# Patient Record
Sex: Female | Born: 1935 | Race: White | Hispanic: No | Marital: Married | Smoking: Never smoker
Health system: Southern US, Community
[De-identification: ages and names within clinical notes are randomized; demographics above are authoritative.]

## PROBLEM LIST (undated history)

## (undated) DIAGNOSIS — K449 Diaphragmatic hernia without obstruction or gangrene: Secondary | ICD-10-CM

## (undated) DIAGNOSIS — L509 Urticaria, unspecified: Secondary | ICD-10-CM

## (undated) DIAGNOSIS — J40 Bronchitis, not specified as acute or chronic: Secondary | ICD-10-CM

## (undated) DIAGNOSIS — U071 COVID-19: Secondary | ICD-10-CM

## (undated) DIAGNOSIS — R519 Headache, unspecified: Secondary | ICD-10-CM

## (undated) DIAGNOSIS — F419 Anxiety disorder, unspecified: Secondary | ICD-10-CM

## (undated) DIAGNOSIS — M1990 Unspecified osteoarthritis, unspecified site: Secondary | ICD-10-CM

## (undated) DIAGNOSIS — S329XXA Fracture of unspecified parts of lumbosacral spine and pelvis, initial encounter for closed fracture: Secondary | ICD-10-CM

## (undated) DIAGNOSIS — IMO0001 Reserved for inherently not codable concepts without codable children: Secondary | ICD-10-CM

## (undated) DIAGNOSIS — H9209 Otalgia, unspecified ear: Secondary | ICD-10-CM

## (undated) DIAGNOSIS — C4490 Unspecified malignant neoplasm of skin, unspecified: Secondary | ICD-10-CM

## (undated) DIAGNOSIS — J189 Pneumonia, unspecified organism: Secondary | ICD-10-CM

## (undated) DIAGNOSIS — G8929 Other chronic pain: Secondary | ICD-10-CM

## (undated) DIAGNOSIS — R51 Headache: Secondary | ICD-10-CM

## (undated) DIAGNOSIS — M4800 Spinal stenosis, site unspecified: Secondary | ICD-10-CM

## (undated) DIAGNOSIS — R251 Tremor, unspecified: Secondary | ICD-10-CM

## (undated) DIAGNOSIS — E0590 Thyrotoxicosis, unspecified without thyrotoxic crisis or storm: Secondary | ICD-10-CM

## (undated) DIAGNOSIS — B029 Zoster without complications: Secondary | ICD-10-CM

## (undated) DIAGNOSIS — T1490XA Injury, unspecified, initial encounter: Secondary | ICD-10-CM

## (undated) DIAGNOSIS — Z86718 Personal history of other venous thrombosis and embolism: Secondary | ICD-10-CM

## (undated) DIAGNOSIS — F32A Depression, unspecified: Secondary | ICD-10-CM

## (undated) DIAGNOSIS — Z9289 Personal history of other medical treatment: Secondary | ICD-10-CM

## (undated) DIAGNOSIS — N3090 Cystitis, unspecified without hematuria: Secondary | ICD-10-CM

## (undated) DIAGNOSIS — S62101A Fracture of unspecified carpal bone, right wrist, initial encounter for closed fracture: Secondary | ICD-10-CM

## (undated) DIAGNOSIS — R350 Frequency of micturition: Secondary | ICD-10-CM

## (undated) DIAGNOSIS — F329 Major depressive disorder, single episode, unspecified: Secondary | ICD-10-CM

## (undated) DIAGNOSIS — I2699 Other pulmonary embolism without acute cor pulmonale: Secondary | ICD-10-CM

## (undated) HISTORY — DX: Urticaria, unspecified: L50.9

## (undated) HISTORY — PX: REPLACEMENT TOTAL KNEE BILATERAL: SUR1225

## (undated) HISTORY — DX: Thyrotoxicosis, unspecified without thyrotoxic crisis or storm: E05.90

## (undated) HISTORY — DX: Diaphragmatic hernia without obstruction or gangrene: K44.9

## (undated) HISTORY — DX: Personal history of other venous thrombosis and embolism: Z86.718

## (undated) HISTORY — DX: Zoster without complications: B02.9

## (undated) HISTORY — PX: ADENOIDECTOMY: SUR15

## (undated) HISTORY — DX: Headache, unspecified: R51.9

## (undated) HISTORY — PX: EYE SURGERY: SHX253

## (undated) HISTORY — PX: COLONOSCOPY: SHX174

## (undated) HISTORY — DX: Headache: R51

## (undated) HISTORY — PX: TONSILLECTOMY: SUR1361

## (undated) HISTORY — DX: Other chronic pain: G89.29

## (undated) HISTORY — DX: COVID-19: U07.1

## (undated) HISTORY — PX: OTHER SURGICAL HISTORY: SHX169

## (undated) HISTORY — PX: ABDOMINAL HYSTERECTOMY: SHX81

## (undated) HISTORY — DX: Unspecified malignant neoplasm of skin, unspecified: C44.90

## (undated) HISTORY — PX: CHOLECYSTECTOMY: SHX55

## (undated) HISTORY — PX: WRIST FRACTURE SURGERY: SHX121

---
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
## 1968-01-01 HISTORY — PX: ABDOMINAL HYSTERECTOMY: SHX81

## 1984-01-01 HISTORY — PX: BREAST ENHANCEMENT SURGERY: SHX7

## 1990-12-31 HISTORY — PX: CHOLECYSTECTOMY: SHX55

## 1992-01-01 DIAGNOSIS — I2699 Other pulmonary embolism without acute cor pulmonale: Secondary | ICD-10-CM

## 1992-01-01 HISTORY — DX: Other pulmonary embolism without acute cor pulmonale: I26.99

## 1998-04-28 ENCOUNTER — Ambulatory Visit (HOSPITAL_COMMUNITY): Admission: RE | Admit: 1998-04-28 | Discharge: 1998-04-28 | Payer: Self-pay | Admitting: Internal Medicine

## 1998-11-09 ENCOUNTER — Other Ambulatory Visit: Admission: RE | Admit: 1998-11-09 | Discharge: 1998-11-09 | Payer: Self-pay | Admitting: *Deleted

## 1999-03-27 ENCOUNTER — Ambulatory Visit (HOSPITAL_COMMUNITY): Admission: RE | Admit: 1999-03-27 | Discharge: 1999-03-27 | Payer: Self-pay | Admitting: Internal Medicine

## 1999-03-27 ENCOUNTER — Encounter: Payer: Self-pay | Admitting: Internal Medicine

## 1999-11-16 ENCOUNTER — Encounter: Payer: Self-pay | Admitting: *Deleted

## 1999-11-16 ENCOUNTER — Encounter: Admission: RE | Admit: 1999-11-16 | Discharge: 1999-11-16 | Payer: Self-pay | Admitting: *Deleted

## 1999-11-17 ENCOUNTER — Other Ambulatory Visit: Admission: RE | Admit: 1999-11-17 | Discharge: 1999-11-17 | Payer: Self-pay | Admitting: *Deleted

## 2000-07-04 ENCOUNTER — Emergency Department (HOSPITAL_COMMUNITY): Admission: EM | Admit: 2000-07-04 | Discharge: 2000-07-04 | Payer: Self-pay | Admitting: Emergency Medicine

## 2000-07-04 ENCOUNTER — Encounter: Payer: Self-pay | Admitting: Emergency Medicine

## 2000-11-28 ENCOUNTER — Other Ambulatory Visit: Admission: RE | Admit: 2000-11-28 | Discharge: 2000-11-28 | Payer: Self-pay | Admitting: *Deleted

## 2001-05-02 ENCOUNTER — Emergency Department (HOSPITAL_COMMUNITY): Admission: EM | Admit: 2001-05-02 | Discharge: 2001-05-02 | Payer: Self-pay | Admitting: Emergency Medicine

## 2001-06-24 ENCOUNTER — Encounter: Admission: RE | Admit: 2001-06-24 | Discharge: 2001-08-06 | Payer: Self-pay | Admitting: Orthopedic Surgery

## 2002-02-13 ENCOUNTER — Encounter: Payer: Self-pay | Admitting: Internal Medicine

## 2002-02-13 ENCOUNTER — Ambulatory Visit (HOSPITAL_COMMUNITY): Admission: RE | Admit: 2002-02-13 | Discharge: 2002-02-13 | Payer: Self-pay | Admitting: Internal Medicine

## 2002-10-06 ENCOUNTER — Other Ambulatory Visit: Admission: RE | Admit: 2002-10-06 | Discharge: 2002-10-06 | Payer: Self-pay | Admitting: Obstetrics and Gynecology

## 2004-01-06 ENCOUNTER — Encounter: Admission: RE | Admit: 2004-01-06 | Discharge: 2004-01-06 | Payer: Self-pay | Admitting: Orthopedic Surgery

## 2004-04-11 ENCOUNTER — Encounter: Payer: Self-pay | Admitting: Gastroenterology

## 2004-04-14 ENCOUNTER — Encounter: Payer: Self-pay | Admitting: Gastroenterology

## 2004-11-03 ENCOUNTER — Ambulatory Visit: Payer: Self-pay | Admitting: Internal Medicine

## 2004-11-07 ENCOUNTER — Ambulatory Visit: Payer: Self-pay | Admitting: Internal Medicine

## 2005-01-02 ENCOUNTER — Ambulatory Visit: Payer: Self-pay | Admitting: Internal Medicine

## 2005-01-12 ENCOUNTER — Ambulatory Visit: Payer: Self-pay | Admitting: Internal Medicine

## 2005-04-12 ENCOUNTER — Ambulatory Visit: Payer: Self-pay | Admitting: Internal Medicine

## 2005-05-22 ENCOUNTER — Ambulatory Visit: Payer: Self-pay | Admitting: Internal Medicine

## 2005-05-25 ENCOUNTER — Encounter: Admission: RE | Admit: 2005-05-25 | Discharge: 2005-05-25 | Payer: Self-pay | Admitting: Internal Medicine

## 2005-07-13 ENCOUNTER — Encounter: Admission: RE | Admit: 2005-07-13 | Discharge: 2005-08-01 | Payer: Self-pay | Admitting: Orthopedic Surgery

## 2005-07-19 ENCOUNTER — Ambulatory Visit: Payer: Self-pay | Admitting: Internal Medicine

## 2005-08-02 ENCOUNTER — Encounter: Admission: RE | Admit: 2005-08-02 | Discharge: 2005-08-23 | Payer: Self-pay | Admitting: Orthopedic Surgery

## 2005-09-26 ENCOUNTER — Ambulatory Visit: Payer: Self-pay | Admitting: Internal Medicine

## 2006-05-31 ENCOUNTER — Ambulatory Visit: Payer: Self-pay | Admitting: Internal Medicine

## 2006-07-05 ENCOUNTER — Ambulatory Visit: Payer: Self-pay | Admitting: Internal Medicine

## 2007-01-15 ENCOUNTER — Emergency Department (HOSPITAL_COMMUNITY): Admission: EM | Admit: 2007-01-15 | Discharge: 2007-01-15 | Payer: Self-pay | Admitting: Emergency Medicine

## 2007-01-22 ENCOUNTER — Ambulatory Visit: Payer: Self-pay | Admitting: Internal Medicine

## 2007-02-04 ENCOUNTER — Encounter: Payer: Self-pay | Admitting: Internal Medicine

## 2007-02-04 ENCOUNTER — Ambulatory Visit: Payer: Self-pay | Admitting: Family Medicine

## 2007-02-04 LAB — CONVERTED CEMR LAB
ALT: 16 units/L (ref 0–40)
AST: 20 units/L (ref 0–37)
Albumin: 3.9 g/dL (ref 3.5–5.2)
Alkaline Phosphatase: 74 units/L (ref 39–117)
Bilirubin, Direct: 0.1 mg/dL (ref 0.0–0.3)
HCV Ab: NEGATIVE
Hep A IgM: NEGATIVE
Hep B C IgM: NEGATIVE
Hepatitis B Surface Ag: NEGATIVE
Total Bilirubin: 0.7 mg/dL (ref 0.3–1.2)
Total Protein: 7 g/dL (ref 6.0–8.3)

## 2007-06-02 ENCOUNTER — Emergency Department (HOSPITAL_COMMUNITY): Admission: EM | Admit: 2007-06-02 | Discharge: 2007-06-02 | Payer: Self-pay | Admitting: Emergency Medicine

## 2007-12-26 ENCOUNTER — Telehealth: Payer: Self-pay | Admitting: Internal Medicine

## 2007-12-29 ENCOUNTER — Ambulatory Visit: Payer: Self-pay | Admitting: Internal Medicine

## 2007-12-29 ENCOUNTER — Telehealth (INDEPENDENT_AMBULATORY_CARE_PROVIDER_SITE_OTHER): Payer: Self-pay | Admitting: *Deleted

## 2007-12-29 DIAGNOSIS — J310 Chronic rhinitis: Secondary | ICD-10-CM | POA: Insufficient documentation

## 2007-12-29 DIAGNOSIS — Z978 Presence of other specified devices: Secondary | ICD-10-CM | POA: Insufficient documentation

## 2007-12-29 DIAGNOSIS — G43829 Menstrual migraine, not intractable, without status migrainosus: Secondary | ICD-10-CM | POA: Insufficient documentation

## 2007-12-29 DIAGNOSIS — J45909 Unspecified asthma, uncomplicated: Secondary | ICD-10-CM | POA: Insufficient documentation

## 2007-12-29 DIAGNOSIS — F329 Major depressive disorder, single episode, unspecified: Secondary | ICD-10-CM | POA: Insufficient documentation

## 2007-12-29 DIAGNOSIS — F3289 Other specified depressive episodes: Secondary | ICD-10-CM | POA: Insufficient documentation

## 2008-01-02 ENCOUNTER — Encounter (INDEPENDENT_AMBULATORY_CARE_PROVIDER_SITE_OTHER): Payer: Self-pay | Admitting: *Deleted

## 2008-08-05 ENCOUNTER — Ambulatory Visit: Payer: Self-pay | Admitting: Internal Medicine

## 2008-08-05 DIAGNOSIS — R5383 Other fatigue: Secondary | ICD-10-CM | POA: Insufficient documentation

## 2008-08-05 DIAGNOSIS — F329 Major depressive disorder, single episode, unspecified: Secondary | ICD-10-CM

## 2008-08-05 DIAGNOSIS — F32A Depression, unspecified: Secondary | ICD-10-CM | POA: Insufficient documentation

## 2008-08-05 DIAGNOSIS — Z86718 Personal history of other venous thrombosis and embolism: Secondary | ICD-10-CM | POA: Insufficient documentation

## 2008-08-05 DIAGNOSIS — R5381 Other malaise: Secondary | ICD-10-CM | POA: Insufficient documentation

## 2008-08-09 LAB — CONVERTED CEMR LAB
Basophils Absolute: 0 10*3/uL (ref 0.0–0.1)
Basophils Relative: 0.3 % (ref 0.0–3.0)
Eosinophils Absolute: 0.4 10*3/uL (ref 0.0–0.7)
Eosinophils Relative: 5.1 % — ABNORMAL HIGH (ref 0.0–5.0)
Free T4: 0.9 ng/dL (ref 0.6–1.6)
HCT: 37.4 % (ref 36.0–46.0)
Hemoglobin: 12.8 g/dL (ref 12.0–15.0)
Lymphocytes Relative: 31.5 % (ref 12.0–46.0)
MCHC: 34.1 g/dL (ref 30.0–36.0)
MCV: 93.6 fL (ref 78.0–100.0)
Monocytes Absolute: 0.4 10*3/uL (ref 0.1–1.0)
Monocytes Relative: 5.5 % (ref 3.0–12.0)
Neutro Abs: 4.1 10*3/uL (ref 1.4–7.7)
Neutrophils Relative %: 57.6 % (ref 43.0–77.0)
Platelets: 303 10*3/uL (ref 150–400)
RBC: 3.99 M/uL (ref 3.87–5.11)
RDW: 11.7 % (ref 11.5–14.6)
TSH: 3.74 microintl units/mL (ref 0.35–5.50)
WBC: 7.2 10*3/uL (ref 4.5–10.5)

## 2008-08-11 ENCOUNTER — Telehealth (INDEPENDENT_AMBULATORY_CARE_PROVIDER_SITE_OTHER): Payer: Self-pay | Admitting: *Deleted

## 2008-10-28 ENCOUNTER — Ambulatory Visit: Payer: Self-pay | Admitting: Internal Medicine

## 2009-05-12 ENCOUNTER — Ambulatory Visit: Payer: Self-pay | Admitting: Internal Medicine

## 2009-08-11 ENCOUNTER — Ambulatory Visit: Payer: Self-pay | Admitting: Gastroenterology

## 2009-08-11 DIAGNOSIS — R198 Other specified symptoms and signs involving the digestive system and abdomen: Secondary | ICD-10-CM | POA: Insufficient documentation

## 2009-08-11 DIAGNOSIS — K5900 Constipation, unspecified: Secondary | ICD-10-CM | POA: Insufficient documentation

## 2009-09-08 ENCOUNTER — Ambulatory Visit: Payer: Self-pay | Admitting: Gastroenterology

## 2009-09-08 LAB — HM COLONOSCOPY

## 2009-09-29 ENCOUNTER — Encounter: Payer: Self-pay | Admitting: Internal Medicine

## 2009-12-09 ENCOUNTER — Encounter: Payer: Self-pay | Admitting: Internal Medicine

## 2009-12-31 LAB — HM MAMMOGRAPHY: HM Mammogram: NEGATIVE

## 2010-01-04 ENCOUNTER — Encounter: Admission: RE | Admit: 2010-01-04 | Discharge: 2010-01-04 | Payer: Self-pay | Admitting: Internal Medicine

## 2010-01-11 ENCOUNTER — Ambulatory Visit: Payer: Self-pay | Admitting: Internal Medicine

## 2010-01-11 DIAGNOSIS — Z85828 Personal history of other malignant neoplasm of skin: Secondary | ICD-10-CM | POA: Insufficient documentation

## 2010-01-11 DIAGNOSIS — IMO0002 Reserved for concepts with insufficient information to code with codable children: Secondary | ICD-10-CM | POA: Insufficient documentation

## 2010-01-11 DIAGNOSIS — M1710 Unilateral primary osteoarthritis, unspecified knee: Secondary | ICD-10-CM

## 2010-01-12 ENCOUNTER — Telehealth: Payer: Self-pay | Admitting: Internal Medicine

## 2010-01-12 ENCOUNTER — Encounter: Payer: Self-pay | Admitting: Internal Medicine

## 2010-01-12 LAB — CONVERTED CEMR LAB
AntiThromb III Func: 101 % (ref 76–126)
Anticardiolipin IgA: 5 (ref <10)
Anticardiolipin IgG: 8 (ref <10)
Anticardiolipin IgM: <10 (ref <10)
Homocysteine: 9.8 micromoles/L (ref 4.0–15.4)
Protein C Activity: 144 % — ABNORMAL HIGH (ref 75–133)
Protein S Activity: 74 % (ref 69–129)
Protein S Ag, Total: 138 % (ref 70–140)
Vit D, 25-Hydroxy: 28 ng/mL — ABNORMAL LOW (ref 30–89)

## 2010-01-16 ENCOUNTER — Encounter: Payer: Self-pay | Admitting: Internal Medicine

## 2010-01-16 ENCOUNTER — Encounter (INDEPENDENT_AMBULATORY_CARE_PROVIDER_SITE_OTHER): Payer: Self-pay | Admitting: *Deleted

## 2010-01-17 ENCOUNTER — Telehealth: Payer: Self-pay | Admitting: Internal Medicine

## 2010-02-03 ENCOUNTER — Inpatient Hospital Stay (HOSPITAL_COMMUNITY): Admission: RE | Admit: 2010-02-03 | Discharge: 2010-02-06 | Payer: Self-pay | Admitting: Orthopedic Surgery

## 2010-03-17 ENCOUNTER — Encounter: Payer: Self-pay | Admitting: Internal Medicine

## 2010-06-12 ENCOUNTER — Telehealth: Payer: Self-pay | Admitting: Gastroenterology

## 2010-06-13 ENCOUNTER — Ambulatory Visit: Payer: Self-pay | Admitting: Gastroenterology

## 2010-06-13 DIAGNOSIS — R933 Abnormal findings on diagnostic imaging of other parts of digestive tract: Secondary | ICD-10-CM | POA: Insufficient documentation

## 2010-06-13 DIAGNOSIS — K625 Hemorrhage of anus and rectum: Secondary | ICD-10-CM | POA: Insufficient documentation

## 2010-06-13 DIAGNOSIS — K5730 Diverticulosis of large intestine without perforation or abscess without bleeding: Secondary | ICD-10-CM | POA: Insufficient documentation

## 2010-06-14 ENCOUNTER — Encounter: Payer: Self-pay | Admitting: Physician Assistant

## 2010-06-14 LAB — CONVERTED CEMR LAB
ALT: 20 units/L (ref 0–35)
AST: 25 units/L (ref 0–37)
Albumin: 4.1 g/dL (ref 3.5–5.2)
Alkaline Phosphatase: 77 units/L (ref 39–117)
BUN: 25 mg/dL — ABNORMAL HIGH (ref 6–23)
Basophils Absolute: 0.1 10*3/uL (ref 0.0–0.1)
Basophils Relative: 0.8 % (ref 0.0–3.0)
CO2: 30 meq/L (ref 19–32)
CRP, High Sensitivity: 0.26 (ref 0.00–5.00)
Calcium: 9.6 mg/dL (ref 8.4–10.5)
Chloride: 107 meq/L (ref 96–112)
Creatinine, Ser: 1.1 mg/dL (ref 0.4–1.2)
Eosinophils Absolute: 0.1 10*3/uL (ref 0.0–0.7)
Eosinophils Relative: 1.4 % (ref 0.0–5.0)
GFR calc non Af Amer: 52.37 mL/min (ref >60)
Glucose, Bld: 108 mg/dL — ABNORMAL HIGH (ref 70–99)
HCT: 37.9 % (ref 36.0–46.0)
Hemoglobin: 13 g/dL (ref 12.0–15.0)
Lymphocytes Relative: 29.4 % (ref 12.0–46.0)
Lymphs Abs: 2 10*3/uL (ref 0.7–4.0)
MCHC: 34.3 g/dL (ref 30.0–36.0)
MCV: 95.3 fL (ref 78.0–100.0)
Monocytes Absolute: 0.8 10*3/uL (ref 0.1–1.0)
Monocytes Relative: 11.2 % (ref 3.0–12.0)
Neutro Abs: 3.9 10*3/uL (ref 1.4–7.7)
Neutrophils Relative %: 57.2 % (ref 43.0–77.0)
Platelets: 305 10*3/uL (ref 150.0–400.0)
Potassium: 5 meq/L (ref 3.5–5.1)
RBC: 3.98 M/uL (ref 3.87–5.11)
RDW: 12.9 % (ref 11.5–14.6)
Sodium: 144 meq/L (ref 135–145)
Total Bilirubin: 0.9 mg/dL (ref 0.3–1.2)
Total Protein: 6.6 g/dL (ref 6.0–8.3)
WBC: 6.8 10*3/uL (ref 4.5–10.5)

## 2010-06-16 ENCOUNTER — Encounter: Payer: Self-pay | Admitting: Physician Assistant

## 2010-09-05 ENCOUNTER — Ambulatory Visit: Payer: Self-pay | Admitting: Internal Medicine

## 2010-09-06 ENCOUNTER — Ambulatory Visit: Payer: Self-pay | Admitting: Internal Medicine

## 2010-09-08 LAB — CONVERTED CEMR LAB
Basophils Absolute: 0 10*3/uL (ref 0.0–0.1)
Basophils Relative: 0.3 % (ref 0.0–3.0)
Eosinophils Absolute: 0.1 10*3/uL (ref 0.0–0.7)
Eosinophils Relative: 1.9 % (ref 0.0–5.0)
HCT: 37.5 % (ref 36.0–46.0)
Hemoglobin: 12.8 g/dL (ref 12.0–15.0)
Lymphocytes Relative: 37.8 % (ref 12.0–46.0)
Lymphs Abs: 2.7 10*3/uL (ref 0.7–4.0)
MCHC: 34 g/dL (ref 30.0–36.0)
MCV: 94.9 fL (ref 78.0–100.0)
Monocytes Absolute: 0.8 10*3/uL (ref 0.1–1.0)
Monocytes Relative: 11.4 % (ref 3.0–12.0)
Neutro Abs: 3.4 10*3/uL (ref 1.4–7.7)
Neutrophils Relative %: 48.6 % (ref 43.0–77.0)
Platelets: 347 10*3/uL (ref 150.0–400.0)
RBC: 3.96 M/uL (ref 3.87–5.11)
RDW: 13.2 % (ref 11.5–14.6)
TSH: 3.77 microintl units/mL (ref 0.35–5.50)
WBC: 7.1 10*3/uL (ref 4.5–10.5)

## 2010-10-13 ENCOUNTER — Encounter: Payer: Self-pay | Admitting: Internal Medicine

## 2011-01-28 LAB — CONVERTED CEMR LAB
BUN: 15 mg/dL (ref 6–23)
Basophils Absolute: 0 10*3/uL (ref 0.0–0.1)
Basophils Relative: 0.6 % (ref 0.0–3.0)
CK-MB: 1.4 ng/mL (ref 0.3–4.0)
CO2: 25 meq/L (ref 19–32)
Calcium: 9.4 mg/dL (ref 8.4–10.5)
Chloride: 101 meq/L (ref 96–112)
Creatinine, Ser: 1.1 mg/dL (ref 0.4–1.2)
Eosinophils Absolute: 0.3 10*3/uL (ref 0.0–0.7)
Eosinophils Relative: 4.4 % (ref 0.0–5.0)
GFR calc non Af Amer: 51.33 mL/min (ref >60)
Glucose, Bld: 87 mg/dL (ref 70–99)
HCT: 40.4 % (ref 36.0–46.0)
Hemoglobin: 13.2 g/dL (ref 12.0–15.0)
Lymphocytes Relative: 28 % (ref 12.0–46.0)
Lymphs Abs: 1.9 10*3/uL (ref 0.7–4.0)
MCHC: 32.6 g/dL (ref 30.0–36.0)
MCV: 97.2 fL (ref 78.0–100.0)
Monocytes Absolute: 0.6 10*3/uL (ref 0.1–1.0)
Monocytes Relative: 8.8 % (ref 3.0–12.0)
Neutro Abs: 4.1 10*3/uL (ref 1.4–7.7)
Neutrophils Relative %: 58.2 % (ref 43.0–77.0)
Platelets: 288 10*3/uL (ref 150.0–400.0)
Potassium: 4.1 meq/L (ref 3.5–5.1)
RBC: 4.15 M/uL (ref 3.87–5.11)
RDW: 12.3 % (ref 11.5–14.6)
Sodium: 134 meq/L — ABNORMAL LOW (ref 135–145)
TSH: 3.57 microintl units/mL (ref 0.35–5.50)
Total CK: 63 units/L (ref 7–177)
Troponin I: 0.02 ng/mL (ref <0.06)
WBC: 6.9 10*3/uL (ref 4.5–10.5)

## 2011-01-30 NOTE — Letter (Signed)
Summary: Surgical Clearance/[facility]  Surgical Clearance/[facility]   Imported By: Edmonia James 01/25/2010 10:23:38  _____________________________________________________________________  External Attachment:    Type:   Image     Comment:   External Document

## 2011-01-30 NOTE — Assessment & Plan Note (Signed)
Summary: POSSIBLE PNEUMONIA//KN   Vital Signs:  Patient profile:   77 year old female Weight:      164.2 pounds Temp:     98.1 degrees F oral Pulse rate:   72 / minute Resp:     17 per minute BP sitting:   110 / 68  (left arm) Cuff size:   regular  Vitals Entered By: Georgette Dover CMA (September 05, 2010 3:14 PM) CC: Weak, cold sweats and low-grade fever x 10days, URI symptoms, Fatigue   Primary Care Provider:  Unice Cobble, MD  CC:  Weak, cold sweats and low-grade fever x 10days, URI symptoms, and Fatigue.  History of Present Illness: URI Symptoms      This is a 77 year old woman who presents with URI symptoms over 10 days ; onset as ST followed by low geade fever & headache.All have resolved except for profound fatigue.  The patient reports productive cough with white sputum, but denies purulent nasal discharge, sore throat, dry cough, and earache.  Associated symptoms include dyspnea on exertion  and wheezing.  The patient denies fever now . The patient also reports itchy watery eyes, sneezing, and severe fatigue.  The patient denies headache.  The patient denies the following risk factors for Strep sinusitis: facial pain, tooth pain, and tender LA. Rx: rest , ASA & fluids.PMH of asthma; rescue used two times a day recently. No controller use. Fatigue      The patient also presents with Fatigue.  The patient reports persistent fatigue and fatigue  even without physical activity  The patient also reports night sweats.  The patient denies weight loss, exertional chest pain, and hemoptysis.  The patient denies the following symptoms: leg swelling, orthopnea, PND, melena, and daytime sleepiness.  Depressive symptoms include feeling depressed and poor sleep, worse recently . She is seeing a Teacher, music.    Current Medications (verified): 1)  Clonazepam 0.5 Mg  Tabs (Clonazepam) .Marland Kitchen.. 1 -2 At Bedtime Prn 2)  Prozac 20 Mg  Caps (Fluoxetine Hcl) .Marland Kitchen.. 1 Qd 3)  Excedrin Migraine 250-250-65 Mg  Tabs (Aspirin-Acetaminophen-Caffeine) .... As Needed For Migraines 4)  Motrin Ib 200 Mg Tabs (Ibuprofen) .... As Needed 5)  Aspir-Low 81 Mg Tbec (Aspirin) .... Take 1 Tablet By Mouth Once A Day 6)  Wellbutrin Sr 150 Mg Xr12h-Tab (Bupropion Hcl) .... 2 By Mouth Once Daily 7)  Vitamin D 2000 Unit Tabs (Cholecalciferol) .... Take One By Mouth Once Daily 8)  Vitamin B-12 100 Mcg Tabs (Cyanocobalamin) .... Take One By Mouth Once Daily 9)  Anusol-Hc 25 Mg Supp (Hydrocortisone Acetate) .... Use 1 Suppository Rectally At Bedtime X 10 Days 10)  Mebendazole 100 Mg Chew (Mebendazole) .... Take 1 Tab Twice Daily X 3 Days  Allergies: 1)  ! Pcn 2)  ! Wellbutrin  Review of Systems Eyes:  Denies discharge, eye pain, and red eye; Burning in eyes.  Physical Exam  General:  in no acute distress; alert,appropriate and cooperative throughout examination Eyes:  No corneal or conjunctival inflammation noted. EOMI. Perrla.  Vision grossly normal. Ears:  External ear exam shows no significant lesions or deformities.  Otoscopic examination reveals clear canals, tympanic membranes are intact bilaterally without bulging, retraction, inflammation or discharge. Hearing is grossly normal bilaterally. Nose:  External nasal examination shows no deformity or inflammation. Nasal mucosa are pink and moist without lesions or exudates. Mouth:  Oral mucosa and oropharynx without lesions or exudates.  Teeth in good repair. Lungs:  Normal respiratory effort,  chest expands symmetrically. Lungs are clear to auscultation, no crackles or wheezes. Heart:  Normal rate and regular rhythm. S1 and S2 normal without gallop, murmur, click, rub . S4 Skin:  Intact without suspicious lesions or rashes Cervical Nodes:  Small LN on L Axillary Nodes:  No palpable lymphadenopathy Psych:  Oriented X3, memory intact for recent and remote, flat affect, and subdued.     Impression & Recommendations:  Problem # 1:  URI (ICD-465.9)  Her updated  medication list for this problem includes:    Motrin Ib 200 Mg Tabs (Ibuprofen) .Marland Kitchen... As needed    Aspir-low 81 Mg Tbec (Aspirin) .Marland Kitchen... Take 1 tablet by mouth once a day  Orders: Venipuncture [id number]) TLB-CBC Platelet - w/Differential (85025-CBCD)  Problem # 2:  BRONCHITIS-ACUTE (ICD-466.0)  Orders: Venipuncture [id number]) TLB-CBC Platelet - w/Differential (85025-CBCD) T-2 View CXR (71020TC) Prescription Created Electronically [id number]  Her updated medication list for this problem includes:    Azithromycin 250 Mg Tabs (Azithromycin) .Marland Kitchen... As per pack    Advair Diskus 100-50 Mcg/dose Aepb (Fluticasone-salmeterol) .Marland Kitchen... 1 inhalation ever 12 hrs ; gargle & spit after use.  Problem # 3:  DEPRESSION, MAJOR (ICD-296.20)  Problem # 4:  ASTHMA (ICD-493.90)  PMH of   Her updated medication list for this problem includes:    Advair Diskus 100-50 Mcg/dose Aepb (Fluticasone-salmeterol) .Marland Kitchen... 1 inhalation ever 12 hrs ; gargle & spit after use.  Problem # 5:  FATIGUE (ICD-780.79)  Orders: Venipuncture [id number]) TLB-TSH (Thyroid Stimulating Hormone) (84443-TSH) TLB-CBC Platelet - w/Differential (85025-CBCD)  Complete Medication List: 1)  Clonazepam 0.5 Mg Tabs (Clonazepam) .Marland Kitchen.. 1 -2 at bedtime prn 2)  Prozac 20 Mg Caps (Fluoxetine hcl) .Marland Kitchen.. 1 qd 3)  Excedrin Migraine 250-250-65 Mg Tabs (Aspirin-acetaminophen-caffeine) .... As needed for migraines 4)  Motrin Ib 200 Mg Tabs (Ibuprofen) .... As needed 5)  Aspir-low 81 Mg Tbec (Aspirin) .... Take 1 tablet by mouth once a day 6)  Wellbutrin Sr 150 Mg Xr12h-tab (Bupropion hcl) .... 2 by mouth once daily 7)  Vitamin D 2000 Unit Tabs (Cholecalciferol) .... Take one by mouth once daily 8)  Vitamin B-12 100 Mcg Tabs (Cyanocobalamin) .... Take one by mouth once daily 9)  Anusol-hc 25 Mg Supp (Hydrocortisone acetate) .... Use 1 suppository rectally at bedtime x 10 days 10)  Mebendazole 100 Mg Chew (Mebendazole) .... Take 1 tab twice daily x 3 days 11)   Azithromycin 250 Mg Tabs (Azithromycin) .... As per pack 12)  Advair Diskus 100-50 Mcg/dose Aepb (Fluticasone-salmeterol) .Marland Kitchen.. 1 inhalation ever 12 hrs ; gargle & spit after use.  Patient Instructions: 1)  Neti pot once daily as needed for any head congestion.  2)  Drink as much fluid as you can tolerate for the next few days. Prescriptions: ADVAIR DISKUS 100-50 MCG/DOSE AEPB (FLUTICASONE-SALMETEROL) 1 inhalation ever 12 hrs ; gargle & spit after use.  #1 x 5   Entered and Authorized by:   Unice Cobble MD   Signed by:   Unice Cobble MD on 09/05/2010   Method used:   Samples Given   RxID:   [id number] AZITHROMYCIN 250 MG TABS (AZITHROMYCIN) as per pack  #1 x 0   Entered and Authorized by:   Unice Cobble MD   Signed by:   Unice Cobble MD on 09/05/2010   Method used:   Faxed to ...       [facility] [address] [phone]* (retail)       [address]  [place], [place]  [address]       Ph: [phone]       Fax: [phone]   RxID:   [id number]   Appended Document: POSSIBLE PNEUMONIA//KN

## 2011-01-30 NOTE — Miscellaneous (Signed)
Summary: Flu/[facility]  Flu/[facility]   Imported By: Edmonia James 10/21/2010 08:31:48  _____________________________________________________________________  External Attachment:    Type:   Image     Comment:   External Document

## 2011-01-30 NOTE — Miscellaneous (Signed)
Summary: RX [facility]  Clinical Lists Changes  Medications: Added new medication of MEBENDAZOLE 100 MG CHEW (MEBENDAZOLE) Take 1 tab twice daily x 3 days - Signed Rx of MEBENDAZOLE 100 MG CHEW (MEBENDAZOLE) Take 1 tab twice daily x 3 days;  #6 x 1;  Signed;  Entered by: Marisue Humble NCMA;  Authorized by: Alfredia Ferguson PA-c;  Method used: Electronically to [facility] Dr[facility] [phone]*, [address] [place], [place]  [address], Ph: [phone], Fax: [phone]    Prescriptions: MEBENDAZOLE 100 MG CHEW (MEBENDAZOLE) Take 1 tab twice daily x 3 days  #6 x 1   Entered by:   Marisue Humble NCMA   Authorized by:   Alfredia Ferguson PA-c   Signed by:   Marisue Humble NCMA on 06/16/2010   Method used:   Electronically to        [facility] [address] [phone]* (retail)       [address]       [address]       [place], [place]  [address]       Ph: [phone]       Fax: [phone]   [place]:   [phone]

## 2011-01-30 NOTE — Letter (Signed)
Summary: Results Follow up Letter  [facility]   [place], [place] [address]   Phone: [phone]  Fax: [phone]    01/16/2010 MRN: [id number]  [facility] [address] [address] [place],   [address]  Dear Faith Edwards,  The following are the results of your recent test(s):  Test         Result    Pap Smear:        Normal _____  Not Normal _____ Comments: ______________________________________________________ Cholesterol: LDL(Bad cholesterol):         Your goal is less than:         HDL (Good cholesterol):       Your goal is more than: Comments:  ______________________________________________________ Mammogram:        Normal _____  Not Normal _____ Comments:  ___________________________________________________________________ Hemoccult:        Normal _____  Not normal _______ Comments:    _____________________________________________________________________ Other Tests: Please see attached labs done on 01/11/2010 & 01/12/2010    We routinely do not discuss normal results over the telephone.  If you desire a copy of the results, or you have any questions about this information we can discuss them at your next office visit.   Sincerely,

## 2011-01-30 NOTE — Assessment & Plan Note (Signed)
Summary: constipation/20 lb wt loss/rectal bleeding/sheri    History of Present Illness Visit Type: Follow-up Visit Primary GI MD: Joylene Igo MD [facility] Primary Provider: Unice Cobble, MD Chief Complaint: Patient states that she thinks she has a tape worm she states that she has passed two peices of it and she has even looked at one peice of it under a microscope. She last found the "worm" last friday and describes it as flat and translucent with a spine. She complains of alot of nausea and decrease in appetite she complains of weight loss, which she lost alot of weight while in rehab for her knee surgery. She has some rectal pain and BRB which comes and goes and started about a week ago. She also complains of change in bowels which she has to take a laxative for her constipation and then she has diarrhea.  History of Present Illness:   PLEASANT 77 YO FEMALE KNOWN TO DR. Fuller Plan. SHE HAD A COLONOSCOPY IN 9/10 SHOWING DIVERTICULOSIS. SHE COMES IN TODAY  BECAUSE SHE HAS BEEN HAVING NAUSEA,RECENT WEIGHT LOSS ANS IS FAIRLY CERATIN SHE PASSED A WORM ABOUT 4 DAYS AGO.  SHE RELATES KNEE SURGERY  EARLIER THIS SPRING, AND SPENT  3 WEEKS IN A REHAB FACILITY ([facility]), WHICH SHE SAYS WAS VERY DIRTY AS WAS THE STAFF. SHE GOT TO THE POINT OF ONLY EATING PREPACKAGED ITEMS WHILE THERE. SHE STAYED BECAUSE THE THERAPY WAS VERY GOOD. SHE LOST WEIGHT -AROUND 20 POUNDS DURING THAT TIME,HAS NOT BEEN ABLE TO GAIN ANY BACK. SHE HAS HAD LONG TERM CONSTIPATION-USES A WOMENS LAXATIVE ONCE A WEEK. THIS PAST FRIDAY SHE HAD A BM,AND WHILE USING THE TISSUE FELT SOMETHING STILL ATTACHED TO HER RECTUM,SHE PULLED AND SAW A LONG -FINGER LENGTH TRANSLUSCENT WORM,WITH A SPINE OR RIDGE UP THE MIDDLE. IT WAS TAPERED ON ONE END AND BLUNT ON THE OTHER-SHE LOOKED AT IT WITH A MAGNIFYING GLASS,BUT DID NOT SAVE IT. SHE THINKS SHE PASSED A LITTLE PIECE OF A WORM 2 DAYS BEFORE THAT. SHE HAD A LITTLE BLOOD ON THE TISSUE THIS WEEKEND. SHE HAS  HAD NAUSEA,FATIGUE.   GI Review of Systems    Reports bloating and  weight loss.   Weight loss of 20 pounds over 6 months.   Denies abdominal pain, acid reflux, belching, chest pain, dysphagia with liquids, dysphagia with solids, heartburn, loss of appetite, nausea, vomiting, vomiting blood, and  weight gain.      Reports change in bowel habits, constipation, diarrhea, rectal bleeding, and  rectal pain.     Denies anal fissure, black tarry stools, diverticulosis, fecal incontinence, heme positive stool, hemorrhoids, irritable bowel syndrome, jaundice, light color stool, and  liver problems.    Current Medications (verified): 1)  Clonazepam 0.5 Mg  Tabs (Clonazepam) .Marland Kitchen.. 1 -2 At Bedtime Prn 2)  Prozac 20 Mg  Caps (Fluoxetine Hcl) .Marland Kitchen.. 1 Qd 3)  Excedrin Migraine 250-250-65 Mg Tabs (Aspirin-Acetaminophen-Caffeine) .... As Needed For Migraines 4)  Motrin Ib 200 Mg Tabs (Ibuprofen) .... As Needed 5)  Aspir-Low 81 Mg Tbec (Aspirin) .... Take 1 Tablet By Mouth Once A Day 6)  Wellbutrin Sr 150 Mg Xr12h-Tab (Bupropion Hcl) .... 2 By Mouth Once Daily 7)  Vitamin D 2000 Unit Tabs (Cholecalciferol) .... Take One By Mouth Once Daily 8)  Vitamin B-12 100 Mcg Tabs (Cyanocobalamin) .... Take One By Mouth Once Daily  Allergies (verified): 1)  ! Pcn 2)  ! Wellbutrin  Past History:  Past Medical History: Reviewed history from 01/11/2010 and no  changes required. Allergic rhinitis Asthma Osteoporosis Pulmonary embolism, hx of X 1,1992 on HRT Chronic constipation Pelvis fracture Coccyx fracture Right arm fracture Hx of endometriosis Migraine Headaches Depression Hyperlipidemia Hx of Mitral Valve Prolapse Diverticulosis DVT, hx of, 04/2006 Skin cancer, hx of, Basal Cell ear  Past Surgical History: Hysterectomy-partial-1969 Cataract extraction-left eye Anal fissure repair Breast implants-1975 cholecystectomy-1990 Total knee replacement , complicated by DVT , [id number], Dr Vaughn ,  [place] 2010/04/06  Family History: Reviewed history from 01/11/2010 and no changes required. Family History of Diabetes: 2 children Family History of Liver Disease/Cirrhosis: Son-Hep C,DM, Suicide ; son died aspiration PNA, Schizophrenia Family History of Colon Cancer: P Uncle Father: PTE X2 w/o etiology Mother: obesity, arthritis, scoliosis, CAD, polyps Siblings: bro CAD/MI X 2, polyps  Social History: Reviewed history from 01/11/2010 and no changes required. Married, 2 deceased sons-both in April 06, 2008 Occupation: Retired Patient has never smoked.  Daily Caffeine Use 1 cup coffee Alcohol use-no  Review of Systems       The patient complains of allergy/sinus, anxiety-new, arthritis/joint pain, confusion, cough, depression-new, fatigue, headaches-new, night sweats, shortness of breath, sleeping problems, urination - excessive, urination changes/pain, and urine leakage.  The patient denies anemia, back pain, blood in urine, breast changes/lumps, change in vision, coughing up blood, fainting, fever, hearing problems, heart murmur, heart rhythm changes, itching, menstrual pain, muscle pains/cramps, nosebleeds, pregnancy symptoms, skin rash, sore throat, swelling of feet/legs, swollen lymph glands, thirst - excessive , urination - excessive , vision changes, and voice change.         ROS OTHERWISE AS IN HPI  Vital Signs:  Patient profile:   77 year old female Height:      65.75 inches Weight:      158.4 pounds BMI:     25.85 Pulse rate:   88 / minute Pulse rhythm:   regular BP sitting:   94 / 54  (left arm) Cuff size:   regular  Vitals Entered By: Bernita Buffy CMA Deborra Medina) (June 13, 2010 2:15 PM)  Physical Exam  General:  Well developed, well nourished, no acute distress. Head:  Normocephalic and atraumatic. Eyes:  PERRLA, no icterus. Lungs:  Clear throughout to auscultation. Heart:  Regular rate and rhythm; no murmurs, rubs,  or bruits. Abdomen:  SOFT, NO FOCAL  TENDERNESS, NO MASS OR HSM,BS+ Rectal:  NOT DONE Neurologic:  Alert and  oriented x4;  grossly normal neurologically. Psych:  Alert and cooperative. Normal mood and affect.   Impression & Recommendations:  Problem # 1:  ABNORMAL FINDINGS GI TRACT (ICD-793.4) Assessment New 78 YO FEMALE WITH RECENT WEIGHT LOSS, MINIMAL HEMATOCHEZIA, NAUSEA, AND PASSAGE OF WHAT APPEARED TO BE A WORM (TRANSLUSCENT,FINGER LENGTH) 4 DAYS AGO. R/O NEMATODE INFECTION-STRONGOLOIDES,ASCARIS,HOOKWORM,WHIPWORM.  LABS TODAY- ASSESS FOR EOS STOOL STUDIES START DAILY MIRALAX 17 GM DAILY FOR CONSTIPATION WILL AWAIT STOOL STUDIES /LABS BUT EVEN IF NEGATIVE  LIKELY TREAT WITH VERMOX X 3 DAYS THEN REPEAT IN 3 WEEKS.  Problem # 2:  DIVERTICULOSIS-COLON (ICD-562.10) Assessment: Comment Only  Other Orders: T-Stool for O&P [id number]) T-Fecal WBC [id number]) TLB-CMP (Comprehensive Metabolic Pnl) (0000000) TLB-CRP-High Sensitivity (C-Reactive Protein) (86140-FCRP) TLB-CBC Platelet - w/Differential (85025-CBCD)  Patient Instructions: 1)  Please go to lab, basement level. 2)  We sent a prescription for Anusol HC Suppositores, use 1 nightly before bed. 3)  Copy sent to : Unice Cobble, Md 4)  The medication list was reviewed and reconciled.  All changed / newly prescribed medications were explained.  A complete medication list was provided  to the patient / caregiver. Prescriptions: ANUSOL-HC 25 MG SUPP (HYDROCORTISONE ACETATE) Use 1 suppository rectally at bedtime x 10 days  #10 x 0   Entered by:   Marisue Humble NCMA   Authorized by:   Alfredia Ferguson PA-c   Signed by:   Marisue Humble NCMA on 06/13/2010   Method used:   Electronically to        [facility] [address] [phone]* (retail)       [address]       [address]       [place], [place]  [address]       Ph: [phone]       Fax: [phone]   RxIDIN:[id number]

## 2011-01-30 NOTE — Progress Notes (Signed)
Summary: fyi fyi pt condition fyi fyi  Phone Note Call from Patient   Caller: Patient Summary of Call: Pt called back stating that she didn't want dr Rosalba Totty to worry about the condition she was in at her OV. pt states that she has just started on wellbutrin that was rx by Dr Alvina Filbert which gave her tremors. pt states that she has since gotten this taking care of so she does not want you to worry..............Marland KitchenFelecia Deloach CMA  January 17, 2010 4:13 PM   Follow-up for Phone Call        noted Follow-up by: Unice Cobble MD,  January 17, 2010 5:33 PM

## 2011-01-30 NOTE — Medication Information (Signed)
Summary: Med Issue Order/[facility]  Med Issue Order/[facility]   Imported By: Edmonia James 03/23/2010 10:53:37  _____________________________________________________________________  External Attachment:    Type:   Image     Comment:   External Document

## 2011-01-30 NOTE — Assessment & Plan Note (Signed)
Summary: surgery clearance,knee replacement//fd   Vital Signs:  Patient profile:   77 year old female Height:      65.75 inches Weight:      170 pounds BMI:     27.75 Temp:     98.4 degrees F oral Pulse rate:   77 / minute Resp:     16 per minute BP sitting:   100 / 62  (left arm) Cuff size:   regular  Vitals Entered By: Georgette Dover (January 11, 2010 2:12 PM) CC: Surgical Clearance: Left Knee replacement, Pre-op Evaluation Comments REVIEWED MED LIST, PATIENT AGREED DOSE AND INSTRUCTION CORRECT    CC:  Surgical Clearance: Left Knee replacement and Pre-op Evaluation.  History of Present Illness: Total knee replacement planned 02/03/2010 by Dr Maureen Ralphs. Pain is constant "24/7" despite Motrin & it is "buckling under me". Last BMD :?  "little bone shrinkage".The patient denies respiratory symptoms, GI bleeding, chest pain, edema, PND, heavy ETOH use, and smoking.  Patient has no history of acute or recent MI, unstable or severe angina, decompensated CHF, high grade AV block, symptomatic ventricular arrhythmia, and severe valvular disease.  Positive PMH placing the patient at moderate risk for surgery includes advanced age(l).  Patient has no history of mild angina(m), previous MI(m), compensated CHF(m), diabetes(m), renal insufficiency(m), abnormal ECG(l), and rhythm other than sinus(l).  There is no history of antiplatelet agents, chronic steroids, warfarin, diabetes meds, antianginal meds, and bleeding disorder.  PMH of PTE in 05-Apr-1991 while on estrogen.DVT post R TKR  in 04/04/06. Her father had PTE X2.  Allergies: 1)  ! Pcn 2)  ! Wellbutrin  Past History:  Past Medical History: Allergic rhinitis Asthma Osteoporosis Pulmonary embolism, hx of X 1,1992 on HRT Chronic constipation Pelvis fracture Coccyx fracture Right arm fracture Hx of endometriosis Migraine Headaches Depression Hyperlipidemia Hx of Mitral Valve Prolapse Diverticulosis DVT, hx of, 04/2006 Skin cancer, hx of, Basal  Cell ear  Past Surgical History: Hysterectomy-partial-1969 Cataract extraction-left eye Anal fissure repair Breast implants-1975 cholecystectomy-1990 Total knee replacement , complicated by DVT , [id number], Dr [place]  Family History: Family History of Diabetes: 2 children Family History of Liver Disease/Cirrhosis: Son-Hep C,DM, Suicide ; son died aspiration PNA, Schizophrenia Family History of Colon Cancer: P Uncle Father: PTE X2 w/o etiology Mother: obesity, arthritis, scoliosis, CAD, polyps Siblings: bro CAD/MI X 2, polyps  Social History: Married, 2 deceased sons-both in 04/04/08 Occupation: Retired Patient has never smoked.  Daily Caffeine Use 1 cup coffee Alcohol use-no  Review of Systems General:  Complains of fatigue; denies chills, fever, sweats, and weight loss. Eyes:  Denies blurring, double vision, and vision loss-both eyes. ENT:  Denies difficulty swallowing and hoarseness. CV:  Denies bluish discoloration of lips or nails, leg cramps with exertion, shortness of breath with exertion, swelling of feet, and swelling of hands. Resp:  Denies cough, shortness of breath, sputum productive, and wheezing. GI:  Denies abdominal pain, bloody stools, dark tarry stools, and indigestion. GU:  Denies discharge, dysuria, and hematuria. MS:  See HPI; Complains of joint pain and joint swelling; denies joint redness. Derm:  Denies lesion(s) and rash. Neuro:  Denies brief paralysis, numbness, tingling, and weakness. Psych:  Complains of depression, easily tearful, and suicidal thoughts/plans; denies easily angered and irritability;  ? PTSD  disorder." When asked about suicidal ideationreply was: "sleeping pills would be...". F/U appt with Dr Casimiro Needle 02/02/2010.Dr Casimiro Needle was last seen 12/29/2009 &  Wellbutrin 2 once daily & Prosac Rxed.xed. Orthopedic surgery planned for  02/04. . Endo:  Denies cold intolerance, excessive hunger, excessive thirst, excessive urination, and heat  intolerance. Heme:  Denies abnormal bruising and bleeding. Allergy:  Denies itching eyes and sneezing.  Physical Exam  General:  Appears younger than age,in no acute distress; flat affect but  cooperative throughout examination Head:  Normocephalic and atraumatic without obvious abnormalities. Eyes:  No corneal or conjunctival inflammation noted.  Perrla. Ptosis OD Ears:  External ear exam shows no significant lesions or deformities.  Otoscopic examination reveals clear canals, tympanic membranes are intact bilaterally without bulging, retraction, inflammation or discharge. Hearing is grossly normal bilaterally. Nose:  External nasal examination shows no deformity or inflammation. Nasal mucosa are pink and moist without lesions or exudates. Mouth:  Oral mucosa and oropharynx without lesions or exudates.  Teeth in good repair. Neck:  No deformities, masses, or tenderness noted. Lungs:  Normal respiratory effort, chest expands symmetrically. Lungs are clear to auscultation, no crackles or wheezes. Heart:  Normal rate and regular rhythm. S1 and S2 normal without gallop, murmur, click, rub . S4 Abdomen:  Bowel sounds positive,abdomen soft and non-tender without masses, organomegaly or hernias noted. Genitalia:  [place] Gyn Msk:  No deformity or scoliosis noted of thoracic or lumbar spine.   Pulses:  R and L carotid,radial,dorsalis pedis and posterior tibial pulses are full and equal bilaterally Extremities:  No clubbing, cyanosis, edema, or deformity noted with normal full range of motion of all joints. Marked crepitus L knee   Neurologic:  strength normal in all extremities and DTRs symmetrical and normal.  tremors RUE > LUE ; also L foot Skin:  Intact without suspicious lesions or rashes. Facies weathered Cervical Nodes:  No lymphadenopathy noted Axillary Nodes:  No palpable lymphadenopathy Psych:  depressed affect, flat affect, and subdued.     Impression & Recommendations:  Problem # 1:   DEGENERATIVE JOINT DISEASE, LEFT KNEE (ICD-715.96)  Her updated medication list for this problem includes:    Excedrin Migraine 250-250-65 Mg Tabs (Aspirin-acetaminophen-caffeine) .Marland Kitchen... As needed for migraines    Motrin Ib 200 Mg Tabs (Ibuprofen) .Marland Kitchen... As needed    Aspir-low 81 Mg Tbec (Aspirin) .Marland Kitchen... Take 1 tablet by mouth once a day  Orders: EKG w/ Interpretation (93000)for  surgical clearance  Problem # 2:  DVT, HX OF (ICD-V12.51)  post  R TKR  2007; Coagulopathy assessment indicated pre op Her updated medication list for this problem includes:    Aspir-low 81 Mg Tbec (Aspirin) .Marland Kitchen... Take 1 tablet by mouth once a day  Orders: Venipuncture [id number]) T- * Misc. Laboratory test [phone]) EKG w/ Interpretation (93000)  Problem # 3:  PULMONARY EMBOLISM, HX OF (ICD-V12.51)  on HRT Her updated medication list for this problem includes:    Aspir-low 81 Mg Tbec (Aspirin) .Marland Kitchen... Take 1 tablet by mouth once a day  Orders: Venipuncture [id number]) T- * Misc. Laboratory test [id number]) EKG w/ Interpretation (93000)  Problem # 4:  OSTEOPOROSIS (ICD-733.00)  Orders: Venipuncture [id number]) T-Vitamin D (25-Hydroxy) [id number])  Problem # 5:  DEPRESSION, MAJOR (ICD-296.20) ? PTSD with  suicidal ideation;critical  is clearance by   Dr Casimiro Needle be made for this major surgery  ASAP(ie, is she emotionally stable enough for the surgery itself & the demands of Rehab post op?)  Problem # 6:  ASTHMA (ICD-493.90) Quiescent but sample rescue MDI given as per request The following medications were removed from the medication list:    Advair Diskus 100-50 Mcg/dose Misc (Fluticasone-salmeterol) .Marland Kitchen... 1 inhalation every 12 hrs ; gargle &  swallow after use  Problem # 7:  FATIGUE (ICD-780.79) probably component of depression , but anemia,etc must be considered Orders: TLB-CBC Platelet - w/Differential (85025-CBCD) TLB-TSH (Thyroid Stimulating Hormone) (84443-TSH) Venipuncture [id number]) TLB-BMP (Basic Metabolic  Panel-BMET) (99991111)  Complete Medication List: 1)  Clonazepam 0.5 Mg Tabs (Clonazepam) .Marland Kitchen.. 1 -2 at bedtime prn 2)  Prozac 20 Mg Caps (Fluoxetine hcl) .Marland Kitchen.. 1 qd 3)  Excedrin Migraine 250-250-65 Mg Tabs (Aspirin-acetaminophen-caffeine) .... As needed for migraines 4)  Motrin Ib 200 Mg Tabs (Ibuprofen) .... As needed 5)  Aspir-low 81 Mg Tbec (Aspirin) .... Take 1 tablet by mouth once a day 6)  Wellbutrin Sr 150 Mg Xr12h-tab (Bupropion hcl) .... 2 by mouth once daily  Patient Instructions: 1)  Dr Casimiro Needle must clear you emotionally for this major  surgery & the arduous post op Rehab as we discussed . My Medical Clearance will be determined by results of your lab tests. Blood thinning coverage will be necessary peri operatively because of your past clotting issues

## 2011-01-30 NOTE — Progress Notes (Signed)
Summary: Triage   Phone Note Call from Patient Call back at Home Phone [phone]   Caller: Patient Call For: Dr. Fuller Plan Reason for Call: Talk to Nurse Summary of Call: pt is having weight loss, constipation, rectal bleeding, and feels like she has a parasite  Initial call taken by: Webb Laws,  June 12, 2010 8:47 AM  Follow-up for Phone Call        phone is busy I will continue to try and reach the patient  Barb Merino RN, [facility]  June 12, 2010 9:08 AM Left message for patient to call back Barb Merino RN, [facility]  June 12, 2010 10:31 AM  Patient  has a 20 lb wt loss over the lst few months.  She has severe constipation and rectal bleeding.  patient feels she has a tapeworm.  Patient  is scheduled to come in and see Amy Esterwood PA 06/13/10 @ 2:00. Follow-up by: Barb Merino RN, [place],  June 12, 2010 11:01 AM

## 2011-01-30 NOTE — Progress Notes (Signed)
Summary: [place] Patient last seen Dr Casimiro Needle  Phone Note Call from Patient   Caller: Patient Summary of Call: Pt left VM that she was seen on yesterday and dr hopper inquired when was the last time she saw Dr Alvina Filbert. pt wants dr hopper to know that she last saw him on 12-29-09...................Marland KitchenFelecia Deloach CMA  January 12, 2010 3:54 PM   Follow-up for Phone Call        will add to record Follow-up by: Unice Cobble MD,  January 14, 2010 6:46 AM

## 2011-03-16 ENCOUNTER — Encounter: Payer: Self-pay | Admitting: Internal Medicine

## 2011-03-16 ENCOUNTER — Other Ambulatory Visit: Payer: Self-pay | Admitting: Internal Medicine

## 2011-03-16 ENCOUNTER — Ambulatory Visit (INDEPENDENT_AMBULATORY_CARE_PROVIDER_SITE_OTHER)
Admission: RE | Admit: 2011-03-16 | Discharge: 2011-03-16 | Disposition: A | Discharge status: Home / Self Care | Payer: Medicare Other | Source: Ambulatory Visit | Attending: Internal Medicine | Admitting: Internal Medicine

## 2011-03-16 ENCOUNTER — Ambulatory Visit (INDEPENDENT_AMBULATORY_CARE_PROVIDER_SITE_OTHER): Payer: Medicare Other | Admitting: Internal Medicine

## 2011-03-16 DIAGNOSIS — S298XXA Other specified injuries of thorax, initial encounter: Secondary | ICD-10-CM

## 2011-03-16 DIAGNOSIS — R5383 Other fatigue: Secondary | ICD-10-CM

## 2011-03-16 DIAGNOSIS — R269 Unspecified abnormalities of gait and mobility: Secondary | ICD-10-CM | POA: Insufficient documentation

## 2011-03-16 DIAGNOSIS — R5381 Other malaise: Secondary | ICD-10-CM

## 2011-03-16 LAB — CBC WITH DIFFERENTIAL/PLATELET
Basophils Absolute: 0 10*3/uL (ref 0.0–0.1)
Basophils Relative: 0.2 % (ref 0.0–3.0)
Eosinophils Relative: 1.3 % (ref 0.0–5.0)
HCT: 37.1 % (ref 36.0–46.0)
Hemoglobin: 12.7 g/dL (ref 12.0–15.0)
Lymphocytes Relative: 24.9 % (ref 12.0–46.0)
Lymphs Abs: 2 10*3/uL (ref 0.7–4.0)
MCHC: 34.3 g/dL (ref 30.0–36.0)
Monocytes Relative: 10.2 % (ref 3.0–12.0)
Neutro Abs: 5 10*3/uL (ref 1.4–7.7)
RBC: 3.9 Mil/uL (ref 3.87–5.11)
RDW: 13.2 % (ref 11.5–14.6)

## 2011-03-16 LAB — BASIC METABOLIC PANEL
BUN: 24 mg/dL — ABNORMAL HIGH (ref 6–23)
CO2: 29 mEq/L (ref 19–32)
Calcium: 9.3 mg/dL (ref 8.4–10.5)
Chloride: 104 mEq/L (ref 96–112)
GFR: 53.99 mL/min — ABNORMAL LOW (ref >60.00)
Glucose, Bld: 94 mg/dL (ref 70–99)
Sodium: 138 mEq/L (ref 135–145)

## 2011-03-16 LAB — HEPATIC FUNCTION PANEL
ALT: 19 U/L (ref 0–35)
AST: 22 U/L (ref 0–37)
Albumin: 4.1 g/dL (ref 3.5–5.2)
Alkaline Phosphatase: 67 U/L (ref 39–117)
Bilirubin, Direct: 0.1 mg/dL (ref 0.0–0.3)
Total Bilirubin: 0.7 mg/dL (ref 0.3–1.2)
Total Protein: 6.4 g/dL (ref 6.0–8.3)

## 2011-03-16 LAB — TSH: TSH: 3.89 u[IU]/mL (ref 0.35–5.50)

## 2011-03-18 LAB — COMPREHENSIVE METABOLIC PANEL
ALT: 20 U/L (ref 0–35)
AST: 25 U/L (ref 0–37)
Albumin: 4 g/dL (ref 3.5–5.2)
Alkaline Phosphatase: 75 U/L (ref 39–117)
BUN: 17 mg/dL (ref 6–23)
CO2: 31 mEq/L (ref 19–32)
Calcium: 9.7 mg/dL (ref 8.4–10.5)
Chloride: 103 mEq/L (ref 96–112)
Creatinine, Ser: 1.02 mg/dL (ref 0.4–1.2)
GFR calc Af Amer: >60 mL/min (ref >60)
GFR calc non Af Amer: 53 mL/min — ABNORMAL LOW (ref >60)
Glucose, Bld: 99 mg/dL (ref 70–99)
Potassium: 4.6 mEq/L (ref 3.5–5.1)
Sodium: 139 mEq/L (ref 135–145)
Total Bilirubin: 0.8 mg/dL (ref 0.3–1.2)
Total Protein: 6.9 g/dL (ref 6.0–8.3)

## 2011-03-18 LAB — URINALYSIS, ROUTINE W REFLEX MICROSCOPIC
Bilirubin Urine: NEGATIVE
Glucose, UA: NEGATIVE mg/dL
Hgb urine dipstick: NEGATIVE
Ketones, ur: NEGATIVE mg/dL
Nitrite: NEGATIVE
Protein, ur: NEGATIVE mg/dL
Specific Gravity, Urine: 1.021 (ref 1.005–1.030)
Urobilinogen, UA: 0.2 mg/dL (ref 0.0–1.0)
pH: 5.5 (ref 5.0–8.0)

## 2011-03-18 LAB — CBC
HCT: 39.2 % (ref 36.0–46.0)
Hemoglobin: 12.6 g/dL (ref 12.0–15.0)
MCHC: 32.2 g/dL (ref 30.0–36.0)
MCV: 95.7 fL (ref 78.0–100.0)
Platelets: 294 10*3/uL (ref 150–400)
RBC: 4.1 MIL/uL (ref 3.87–5.11)
RDW: 13.4 % (ref 11.5–15.5)
WBC: 6.6 10*3/uL (ref 4.0–10.5)

## 2011-03-18 LAB — APTT: aPTT: 30 seconds (ref 24–37)

## 2011-03-18 LAB — PROTIME-INR
INR: 0.97 (ref 0.00–1.49)
Prothrombin Time: 12.8 seconds (ref 11.6–15.2)

## 2011-03-20 NOTE — Assessment & Plan Note (Signed)
Summary: [place] and hurt ribs feels it's getting worse/cdj   Vital Signs:  Patient profile:   77 year old female Weight:      168.4 pounds BMI:     27.49 O2 Sat:      99 % on Room air Temp:     98.1 degrees F oral Pulse rate:   88 / minute Resp:     14 per minute BP sitting:   120 / 72  (left arm) Cuff size:   regular  Vitals Entered By: [place] (March 16, 2011 11:39 AM)  O2 Flow:  Room air CC: 1.) Rib pain related to fall on Monday, patient questions if any meds could be related to freqent falls  2.)Fatigue and slurred speech 3.)Cough-ongoing concerns  4.) Trouble staying asleep 5.) Shakey hands , Syncope   Primary Care Provider:  Unice Cobble, MD  CC:  1.) Rib pain related to fall on Monday, patient questions if any meds could be related to freqent falls  2.)Fatigue and slurred speech 3.)Cough-ongoing concerns  4.) Trouble staying asleep 5.) Shakey hands , and Syncope.  History of Present Illness:      This is a 77 year old woman who presents with an injury 03/12/2011  having fallen from her porch due to imbalance ? related to  medications Rxed  for depression .  The patient reports injury to the chest; she landed on her L chest with LUE flexed @ side.  The patient  reports tenderness but denies swelling, redness, increased warmth deformity, blood loss, loss of sensation.She did not have head trauma or loss of consciousness. Rx: NSAIDS. She now describes dull , constant pain ; pain  is sharp with movement.    She  denies premonitory symptoms, palpitations,  lightheadedness, headache, abdominal discomfort, nausea, feeling warm, pallor, diaphoresis, focal weakness, and blurred vision prior to the fall.  The fall  occurred in the setting of  climbing up 2 steps to porch. " I kept going  forward & fell ".  Her friend ,Vaughan Basta, describes "wobbly " gait & some "slurring " of speech over past month.      The patient also presents with Fatigue, present "for years".  The patient reports  persistent fatigue and fatigue  even without physical activity. The patient also reports night sweats and dyspnea.  The patient denies fever, weight loss, and hemoptysis.  The patient denies the following symptoms: leg swelling, orthopnea, PND, melena, adenopathy, severe snoring, and skin changes.  Depressive symptoms include poor sleep & anhedonia.    Current Medications (verified): 1)  Clonazepam 1 Mg Tabs (Clonazepam) .... 1/2-1 By Mouth At Bedtime 2)  Prozac 20 Mg  Caps (Fluoxetine Hcl) .Marland Kitchen.. 1 Qd 3)  Excedrin Migraine 250-250-65 Mg Tabs (Aspirin-Acetaminophen-Caffeine) .... As Needed For Migraines 4)  Motrin Ib 200 Mg Tabs (Ibuprofen) .... As Needed 5)  Aspir-Low 81 Mg Tbec (Aspirin) .... Take 1 Tablet By Mouth Once A Day 6)  Wellbutrin Sr 150 Mg Xr12h-Tab (Bupropion Hcl) .... 2 By Mouth Once Daily 7)  Vitamin D 2000 Unit Tabs (Cholecalciferol) .... Take One By Mouth Once Daily 8)  Vitamin B-12 100 Mcg Tabs (Cyanocobalamin) .... Take One By Mouth Once Daily  Allergies: 1)  ! Pcn 2)  ! Wellbutrin  Physical Exam  General:  Appears  younger than age,in no acute distress; alert,appropriate and cooperative throughout examination Eyes:  No corneal or conjunctival inflammation noted. EOMI. Perrla. Field of  Vision grossly normal. Mouth:  Oral  mucosa and oropharynx without lesions or exudates.  No tongue deviation Neck:  No deformities, masses, or tenderness noted. Chest Wall:  chest wall tenderness L anterior / inferior rib cage.   Lungs:  Normal respiratory effort, chest expands symmetrically. Lungs are clear to auscultation, no crackles or wheezes. Heart:  Normal rate and regular rhythm. S1 and S2 normal without gallop, murmur, click, rub.S4 Abdomen:  Bowel sounds positive,abdomen soft and non-tender without masses, organomegaly or hernias noted. Pulses:  R and L carotid,radial,dorsalis pedis and posterior tibial pulses are full and equal bilaterally Extremities:  No clubbing, cyanosis,  edema.  Neurologic:  alert & oriented X3, cranial nerves II-XII intact, strength normal in all extremities, sensation intact to light touch, gait normal, DTRs symmetrical and normal, finger-to-nose normal, and Romberg negative.   Skin:  Bruising L  shin Cervical Nodes:  No lymphadenopathy noted Axillary Nodes:  No palpable lymphadenopathy Psych:  depressed affect, flat affect, and subdued.     Impression & Recommendations:  Problem # 1:  CHEST WALL INJURY (ICD-959.11)  Orders: EKG w/ Interpretation (93000) T-2 View CXR ([id number]) Prescription Created Electronically [id number]) Physical Therapy Referral (PT)  Problem # 2:  UNSTEADY GAIT (ICD-781.2)  ? iatrogenic  Orders: Physical Therapy Referral (PT)  Problem # 3:  FATIGUE (ICD-780.79)  chronic  Orders: Venipuncture [id number]) TLB-BMP (Basic Metabolic Panel-BMET) (99991111) TLB-CBC Platelet - w/Differential (85025-CBCD) TLB-Hepatic/Liver Function Pnl (80076-HEPATIC) TLB-TSH (Thyroid Stimulating Hormone) (84443-TSH)  Complete Medication List: 1)  Clonazepam 1 Mg Tabs (Clonazepam) .... 1/2-1 by mouth at bedtime 2)  Prozac 20 Mg Caps (Fluoxetine hcl) .Marland Kitchen.. 1 qd 3)  Excedrin Migraine 250-250-65 Mg Tabs (Aspirin-acetaminophen-caffeine) .... As needed for migraines 4)  Motrin Ib 200 Mg Tabs (Ibuprofen) .... As needed 5)  Aspir-low 81 Mg Tbec (Aspirin) .... Take 1 tablet by mouth once a day 6)  Wellbutrin Sr 150 Mg Xr12h-tab (Bupropion hcl) .... 2 by mouth once daily 7)  Vitamin D 2000 Unit Tabs (Cholecalciferol) .... Take one by mouth once daily 8)  Vitamin B-12 100 Mcg Tabs (Cyanocobalamin) .... Take one by mouth once daily 9)  Acetaminophen-codeine #3 300-30 Mg Tabs (Acetaminophen-codeine) .Marland Kitchen.. 1 every 6 hrs as needed pain unrelieved by  ibuprofen 10)  Ventolin Hfa 108 (90 Base) Mcg/act Aers (Albuterol sulfate) .Marland Kitchen.. 1-2 every 4 hrs as needed  Patient Instructions: 1)  Take 400-600mg  of Ibuprofen (Advil, Motrin) with food  every 4-6 hours as needed for relief of pain or comfort of fever.  Please see Dr Casimiro Needle to discuss medication concerns. Prescriptions: VENTOLIN HFA 108 (90 BASE) MCG/ACT AERS (ALBUTEROL SULFATE) 1-2 every 4 hrs as needed  #1 x 2   Entered and Authorized by:   Unice Cobble MD   Signed by:   Unice Cobble MD on 03/16/2011   Method used:   Printed then faxed to ...       [facility] [address] [phone]* (retail)       [address]       [address]       [place], [place]  [address]       Ph: [phone]       Fax: [phone]   RxID:   [id number] ACETAMINOPHEN-CODEINE #3 300-30 MG TABS (ACETAMINOPHEN-CODEINE) 1 every 6 hrs as needed pain unrelieved by  Ibuprofen  #30 x 0   Entered and Authorized by:   Unice Cobble MD   Signed by:   Unice Cobble MD on 03/16/2011   Method used:   Printed then faxed  to ...       [facility] [address] [phone]* (retail)       [address]       [address]       [place], [place]  [address]       Ph: [phone]       Fax: [phone]   RxID:   [id number]    Orders Added: 1)  Est. Patient Level IV [id number] 2)  EKG w/ Interpretation [93000] 3)  Venipuncture [id number] 4)  TLB-BMP (Basic Metabolic Panel-BMET) [id number] 5)  TLB-CBC Platelet - w/Differential [85025-CBCD] 6)  TLB-Hepatic/Liver Function Pnl [80076-HEPATIC] 7)  TLB-TSH (Thyroid Stimulating Hormone) [84443-TSH] 8)  T-2 View CXR [[id number]] 9)  Prescription Created Electronically [id number] 10)  Physical Therapy Referral [PT]

## 2011-03-21 LAB — CBC
HCT: 23 % — ABNORMAL LOW (ref 36.0–46.0)
HCT: 27 % — ABNORMAL LOW (ref 36.0–46.0)
Hemoglobin: 9.2 g/dL — ABNORMAL LOW (ref 12.0–15.0)
Hemoglobin: 9.8 g/dL — ABNORMAL LOW (ref 12.0–15.0)
MCHC: 33.8 g/dL (ref 30.0–36.0)
MCHC: 34.2 g/dL (ref 30.0–36.0)
MCHC: 34.9 g/dL (ref 30.0–36.0)
MCV: 92.9 fL (ref 78.0–100.0)
MCV: 94.7 fL (ref 78.0–100.0)
MCV: 95.2 fL (ref 78.0–100.0)
Platelets: 182 10*3/uL (ref 150–400)
Platelets: 235 10*3/uL (ref 150–400)
RBC: 2.85 MIL/uL — ABNORMAL LOW (ref 3.87–5.11)
RBC: 3.01 MIL/uL — ABNORMAL LOW (ref 3.87–5.11)
RDW: 13.6 % (ref 11.5–15.5)
WBC: 8.6 10*3/uL (ref 4.0–10.5)
WBC: 9.9 10*3/uL (ref 4.0–10.5)

## 2011-03-21 LAB — BASIC METABOLIC PANEL
BUN: 12 mg/dL (ref 6–23)
BUN: 14 mg/dL (ref 6–23)
CO2: 23 mEq/L (ref 19–32)
CO2: 24 mEq/L (ref 19–32)
CO2: 24 mEq/L (ref 19–32)
Calcium: 8 mg/dL — ABNORMAL LOW (ref 8.4–10.5)
Chloride: 109 mEq/L (ref 96–112)
Chloride: 110 mEq/L (ref 96–112)
Chloride: 112 mEq/L (ref 96–112)
Creatinine, Ser: 0.9 mg/dL (ref 0.4–1.2)
Creatinine, Ser: 1.33 mg/dL — ABNORMAL HIGH (ref 0.4–1.2)
GFR calc Af Amer: >60 mL/min (ref >60)
Glucose, Bld: 155 mg/dL — ABNORMAL HIGH (ref 70–99)
Potassium: 3.5 mEq/L (ref 3.5–5.1)
Sodium: 139 mEq/L (ref 135–145)

## 2011-03-21 LAB — TYPE AND SCREEN
ABO/RH(D): AB POS
Antibody Screen: NEGATIVE

## 2011-03-21 LAB — PROTIME-INR: INR: 1.11 (ref 0.00–1.49)

## 2011-03-21 LAB — ABO/RH: ABO/RH(D): AB POS

## 2011-04-03 ENCOUNTER — Other Ambulatory Visit: Payer: Self-pay | Admitting: Internal Medicine

## 2011-04-10 ENCOUNTER — Ambulatory Visit: Payer: Medicare Other | Admitting: Physical Therapy

## 2011-07-16 ENCOUNTER — Ambulatory Visit (INDEPENDENT_AMBULATORY_CARE_PROVIDER_SITE_OTHER): Payer: Medicare Other | Admitting: Internal Medicine

## 2011-07-16 ENCOUNTER — Encounter: Payer: Self-pay | Admitting: Internal Medicine

## 2011-07-16 VITALS — BP 120/70 | HR 102 | Temp 99.3°F | Wt 172.2 lb

## 2011-07-16 DIAGNOSIS — R059 Cough, unspecified: Secondary | ICD-10-CM

## 2011-07-16 DIAGNOSIS — K219 Gastro-esophageal reflux disease without esophagitis: Secondary | ICD-10-CM

## 2011-07-16 DIAGNOSIS — I951 Orthostatic hypotension: Secondary | ICD-10-CM

## 2011-07-16 DIAGNOSIS — R269 Unspecified abnormalities of gait and mobility: Secondary | ICD-10-CM

## 2011-07-16 DIAGNOSIS — R05 Cough: Secondary | ICD-10-CM

## 2011-07-16 MED ORDER — FLUTICASONE-SALMETEROL 100-50 MCG/DOSE IN AEPB: 1.0000 | INHALATION_SPRAY | Freq: Two times a day (BID) | RESPIRATORY_TRACT | Status: DC

## 2011-07-16 NOTE — Patient Instructions (Signed)
The triggers for dyspepsia or "heart burn"  include stress; the "aspirin family" ; alcohol; peppermint; and caffeine (coffee, tea, cola, and chocolate). The aspirin family would include aspirin and the nonsteroidal agents such as ibuprofen &  Naproxen. Tylenol would not cause reflux. If having dyspepsia ; food & dink should be avoided for @ least 2 hors before going to bed.  Use the Advair sample as one inhalation every 12 hours .Gargle & spit after use.  Isometric exercises before standing as discussed.
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
## 2011-08-14 ENCOUNTER — Ambulatory Visit: Payer: Medicare Other | Attending: Internal Medicine | Admitting: Physical Therapy

## 2011-08-14 DIAGNOSIS — R42 Dizziness and giddiness: Secondary | ICD-10-CM | POA: Insufficient documentation

## 2011-08-14 DIAGNOSIS — R269 Unspecified abnormalities of gait and mobility: Secondary | ICD-10-CM | POA: Insufficient documentation

## 2011-08-14 DIAGNOSIS — IMO0001 Reserved for inherently not codable concepts without codable children: Secondary | ICD-10-CM | POA: Insufficient documentation

## 2011-08-27 ENCOUNTER — Ambulatory Visit: Payer: Medicare Other | Admitting: Physical Therapy

## 2011-08-30 ENCOUNTER — Ambulatory Visit: Payer: Medicare Other | Admitting: Physical Therapy

## 2011-09-04 ENCOUNTER — Ambulatory Visit: Payer: Medicare Other | Admitting: Physical Therapy

## 2011-09-06 ENCOUNTER — Ambulatory Visit: Payer: Medicare Other | Attending: Internal Medicine | Admitting: Physical Therapy

## 2011-09-06 DIAGNOSIS — IMO0001 Reserved for inherently not codable concepts without codable children: Secondary | ICD-10-CM | POA: Insufficient documentation

## 2011-09-06 DIAGNOSIS — R269 Unspecified abnormalities of gait and mobility: Secondary | ICD-10-CM | POA: Insufficient documentation

## 2011-09-06 DIAGNOSIS — R42 Dizziness and giddiness: Secondary | ICD-10-CM | POA: Insufficient documentation

## 2011-09-11 ENCOUNTER — Ambulatory Visit: Payer: Medicare Other | Admitting: Physical Therapy

## 2011-09-13 ENCOUNTER — Ambulatory Visit: Payer: Medicare Other | Admitting: Physical Therapy

## 2011-09-18 ENCOUNTER — Ambulatory Visit: Payer: Medicare Other | Admitting: Physical Therapy

## 2011-09-20 ENCOUNTER — Ambulatory Visit: Payer: Medicare Other | Admitting: Physical Therapy

## 2011-09-27 ENCOUNTER — Ambulatory Visit: Payer: Medicare Other | Admitting: Physical Therapy

## 2011-12-06 ENCOUNTER — Encounter: Payer: Self-pay | Admitting: Internal Medicine

## 2011-12-06 ENCOUNTER — Ambulatory Visit (INDEPENDENT_AMBULATORY_CARE_PROVIDER_SITE_OTHER): Payer: Medicare Other | Admitting: Internal Medicine

## 2011-12-06 VITALS — BP 114/78 | HR 104 | Temp 98.3°F | Wt 172.2 lb

## 2011-12-06 DIAGNOSIS — J019 Acute sinusitis, unspecified: Secondary | ICD-10-CM

## 2011-12-06 MED ORDER — SULFAMETHOXAZOLE-TRIMETHOPRIM 800-160 MG PO TABS: 1.0000 | ORAL_TABLET | Freq: Two times a day (BID) | ORAL | Status: AC

## 2011-12-06 NOTE — Progress Notes (Signed)
  Subjective:    Patient ID: Faith Edwards, female    DOB: [birth year], 76 y.o.   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 Fever , no thermometer Frontal headache:L Facial pain:L Nasal purulence:yes Lymphadenopathy:no Wheezing/shortness of breath:yes, using Albuterol once - twice a day as needed Cough/sputum/hemoptysis:blood "clot" ?from sinuses X 1  Smoking history:never           Review of Systems     Objective:   Physical Exam Thin but in good health and nourishment; no acute distress or increased work of breathing is present.  Minor L cervical  lymphadenopathy , none in  axilla   Eyes: No conjunctival inflammation or lid edema is present.   Ears:  External ear exam shows  Bandage R ear.  Otoscopic examination reveals clear canals, tympanic membranes are intact bilaterally without bulging, retraction, inflammation or discharge.  Nose:  External nasal examination shows no deformity or inflammation. Nasal mucosa are dry without lesions or exudates. No septal dislocation.No obstruction to airflow.   Oral exam: Dental hygiene is good; lips and gums are healthy appearing.There is no oropharyngeal erythema or exudate noted.     Heart:  Normal rate and regular rhythm. S1 and S2 normal without gallop, murmur, click, rub or other extra sounds.   Lungs:Chest clear to auscultation; no wheezes, rhonchi,rales ,or rubs present.No increased work of breathing.    Extremities:  No cyanosis, edema, or clubbing  noted    Skin: Warm & dry          Assessment & Plan:  #1 sinusitis Plan: See orders and recommendations

## 2011-12-06 NOTE — Patient Instructions (Signed)
Plain Mucinex for thick secretions ;force NON dairy fluids. Use a Neti pot daily as needed for sinus congestion

## 2012-01-03 DIAGNOSIS — F4323 Adjustment disorder with mixed anxiety and depressed mood: Secondary | ICD-10-CM | POA: Diagnosis not present

## 2012-01-15 ENCOUNTER — Encounter: Payer: Self-pay | Admitting: Internal Medicine

## 2012-01-17 DIAGNOSIS — F4323 Adjustment disorder with mixed anxiety and depressed mood: Secondary | ICD-10-CM | POA: Diagnosis not present

## 2012-02-05 DIAGNOSIS — F4323 Adjustment disorder with mixed anxiety and depressed mood: Secondary | ICD-10-CM | POA: Diagnosis not present

## 2012-02-15 DIAGNOSIS — F331 Major depressive disorder, recurrent, moderate: Secondary | ICD-10-CM | POA: Diagnosis not present

## 2012-02-18 ENCOUNTER — Other Ambulatory Visit: Payer: Self-pay

## 2012-02-18 ENCOUNTER — Emergency Department (INDEPENDENT_AMBULATORY_CARE_PROVIDER_SITE_OTHER)
Admission: EM | Admit: 2012-02-18 | Discharge: 2012-02-18 | Disposition: A | Discharge status: Other Acute Inpatient Hospital | Payer: Medicare Other | Source: Home / Self Care | Attending: Family Medicine | Admitting: Family Medicine

## 2012-02-18 ENCOUNTER — Emergency Department (HOSPITAL_COMMUNITY): Payer: Medicare Other

## 2012-02-18 ENCOUNTER — Encounter (HOSPITAL_COMMUNITY): Payer: Self-pay | Admitting: Emergency Medicine

## 2012-02-18 ENCOUNTER — Emergency Department (HOSPITAL_COMMUNITY)
Admission: EM | Admit: 2012-02-18 | Discharge: 2012-02-18 | Disposition: A | Discharge status: Home / Self Care | Payer: Medicare Other | Attending: Emergency Medicine | Admitting: Emergency Medicine

## 2012-02-18 DIAGNOSIS — Z79899 Other long term (current) drug therapy: Secondary | ICD-10-CM | POA: Insufficient documentation

## 2012-02-18 DIAGNOSIS — Z86711 Personal history of pulmonary embolism: Secondary | ICD-10-CM | POA: Insufficient documentation

## 2012-02-18 DIAGNOSIS — R0602 Shortness of breath: Secondary | ICD-10-CM | POA: Insufficient documentation

## 2012-02-18 DIAGNOSIS — R079 Chest pain, unspecified: Secondary | ICD-10-CM | POA: Insufficient documentation

## 2012-02-18 DIAGNOSIS — Z96659 Presence of unspecified artificial knee joint: Secondary | ICD-10-CM | POA: Diagnosis not present

## 2012-02-18 DIAGNOSIS — R05 Cough: Secondary | ICD-10-CM | POA: Diagnosis not present

## 2012-02-18 DIAGNOSIS — J40 Bronchitis, not specified as acute or chronic: Secondary | ICD-10-CM | POA: Insufficient documentation

## 2012-02-18 DIAGNOSIS — J209 Acute bronchitis, unspecified: Secondary | ICD-10-CM | POA: Diagnosis not present

## 2012-02-18 DIAGNOSIS — Z7982 Long term (current) use of aspirin: Secondary | ICD-10-CM | POA: Insufficient documentation

## 2012-02-18 DIAGNOSIS — R059 Cough, unspecified: Secondary | ICD-10-CM | POA: Diagnosis not present

## 2012-02-18 HISTORY — DX: Other pulmonary embolism without acute cor pulmonale: I26.99

## 2012-02-18 LAB — DIFFERENTIAL
Basophils Absolute: 0 10*3/uL (ref 0.0–0.1)
Eosinophils Relative: 1 % (ref 0–5)
Lymphocytes Relative: 35 % (ref 12–46)
Lymphs Abs: 2 10*3/uL (ref 0.7–4.0)
Monocytes Absolute: 1.4 10*3/uL — ABNORMAL HIGH (ref 0.1–1.0)
Monocytes Relative: 25 % — ABNORMAL HIGH (ref 3–12)
Neutro Abs: 2.3 10*3/uL (ref 1.7–7.7)

## 2012-02-18 LAB — BASIC METABOLIC PANEL
BUN: 17 mg/dL (ref 6–23)
CO2: 26 mEq/L (ref 19–32)
Calcium: 9.6 mg/dL (ref 8.4–10.5)
Chloride: 102 mEq/L (ref 96–112)
Creatinine, Ser: 1.14 mg/dL — ABNORMAL HIGH (ref 0.50–1.10)
Glucose, Bld: 93 mg/dL (ref 70–99)

## 2012-02-18 LAB — URINE MICROSCOPIC-ADD ON

## 2012-02-18 LAB — URINALYSIS, ROUTINE W REFLEX MICROSCOPIC
Bilirubin Urine: NEGATIVE
Glucose, UA: NEGATIVE mg/dL
Hgb urine dipstick: NEGATIVE
Ketones, ur: NEGATIVE mg/dL
Protein, ur: NEGATIVE mg/dL
pH: 6 (ref 5.0–8.0)

## 2012-02-18 LAB — CBC
HCT: 38.9 % (ref 36.0–46.0)
Hemoglobin: 12.7 g/dL (ref 12.0–15.0)
MCH: 31.1 pg (ref 26.0–34.0)
MCHC: 32.6 g/dL (ref 30.0–36.0)
MCV: 95.1 fL (ref 78.0–100.0)
Platelets: 270 K/uL (ref 150–400)
RBC: 4.09 MIL/uL (ref 3.87–5.11)
RDW: 13 % (ref 11.5–15.5)
WBC: 5.9 K/uL (ref 4.0–10.5)

## 2012-02-18 LAB — D-DIMER, QUANTITATIVE: D-Dimer, Quant: 2.05 ug/mL-FEU — ABNORMAL HIGH (ref 0.00–0.48)

## 2012-02-18 MED ORDER — IPRATROPIUM BROMIDE 0.02 % IN SOLN
0.5000 mg | Freq: Once | RESPIRATORY_TRACT | Status: AC
  Administered 2012-02-18: 0.5 mg via RESPIRATORY_TRACT
  Filled 2012-02-18: qty 2.5

## 2012-02-18 MED ORDER — IOHEXOL 300 MG/ML  SOLN
100.0000 mL | Freq: Once | INTRAMUSCULAR | Status: AC | PRN
  Administered 2012-02-18: 100 mL via INTRAVENOUS

## 2012-02-18 MED ORDER — PREDNISONE (PAK) 10 MG PO TABS: 10.0000 mg | ORAL_TABLET | Freq: Every day | ORAL | Status: DC

## 2012-02-18 MED ORDER — ALBUTEROL SULFATE (5 MG/ML) 0.5% IN NEBU
5.0000 mg | INHALATION_SOLUTION | Freq: Once | RESPIRATORY_TRACT | Status: AC
  Administered 2012-02-18: 5 mg via RESPIRATORY_TRACT
  Filled 2012-02-18: qty 1

## 2012-02-18 MED ORDER — ALBUTEROL SULFATE HFA 108 (90 BASE) MCG/ACT IN AERS: 2.0000 | INHALATION_SPRAY | RESPIRATORY_TRACT | Status: DC | PRN

## 2012-02-18 MED ORDER — PREDNISONE 20 MG PO TABS
60.0000 mg | ORAL_TABLET | Freq: Once | ORAL | Status: AC
  Administered 2012-02-18: 60 mg via ORAL
  Filled 2012-02-18: qty 3

## 2012-02-18 NOTE — ED Notes (Signed)
Pt c/o coughing hard for 2 days.  St's chest feels raw from cough.  Clear productive cough

## 2012-02-18 NOTE — ED Notes (Signed)
Bilat bases very fine crackles with fine exp wheezing

## 2012-02-18 NOTE — Discharge Instructions (Signed)
Please take the medications as prescribed and follow up with your primary care provider.  If you have worsening chest pain or shortness of breath, please return to the ER.  You may return to the ER at any time for worsening condition or any new symptoms that concern you.      Bronchitis Bronchitis is the body's way of reacting to injury and/or infection (inflammation) of the bronchi. Bronchi are the air tubes that extend from the windpipe into the lungs. If the inflammation becomes severe, it may cause shortness of breath. CAUSES  Inflammation may be caused by:  A virus.   Germs (bacteria).   Dust.   Allergens.   Pollutants and many other irritants.  The cells lining the bronchial tree are covered with tiny hairs (cilia). These constantly beat upward, away from the lungs, toward the mouth. This keeps the lungs free of pollutants. When these cells become too irritated and are unable to do their job, mucus begins to develop. This causes the characteristic cough of bronchitis. The cough clears the lungs when the cilia are unable to do their job. Without either of these protective mechanisms, the mucus would settle in the lungs. Then you would develop pneumonia. Smoking is a common cause of bronchitis and can contribute to pneumonia. Stopping this habit is the single most important thing you can do to help yourself. TREATMENT   Your caregiver may prescribe an antibiotic if the cough is caused by bacteria. Also, medicines that open up your airways make it easier to breathe. Your caregiver may also recommend or prescribe an expectorant. It will loosen the mucus to be coughed up. Only take over-the-counter or prescription medicines for pain, discomfort, or fever as directed by your caregiver.   Removing whatever causes the problem (smoking, for example) is critical to preventing the problem from getting worse.   Cough suppressants may be prescribed for relief of cough symptoms.   Inhaled  medicines may be prescribed to help with symptoms now and to help prevent problems from returning.   For those with recurrent (chronic) bronchitis, there may be a need for steroid medicines.  SEEK IMMEDIATE MEDICAL CARE IF:   During treatment, you develop more pus-like mucus (purulent sputum).   You have a fever.   Your baby is older than 3 months with a rectal temperature of 102 F (38.9 C) or higher.   Your baby is 24 months old or younger with a rectal temperature of 100.4 F (38 C) or higher.   You become progressively more ill.   You have increased difficulty breathing, wheezing, or shortness of breath.  It is necessary to seek immediate medical care if you are elderly or sick from any other disease. MAKE SURE YOU:   Understand these instructions.   Will watch your condition.   Will get help right away if you are not doing well or get worse.  Document Released: 12/17/2005 Document Revised: 08/29/2011 Document Reviewed: 10/26/2008 [facility] Patient Information 2012 [place].Antibiotic Nonuse  Your caregiver felt that the infection or problem was not one that would be helped with an antibiotic. Infections may be caused by viruses or bacteria. Only a caregiver can tell which one of these is the likely cause of an illness. A cold is the most common cause of infection in both adults and children. A cold is a virus. Antibiotic treatment will have no effect on a viral infection. Viruses can lead to many lost days of work caring for sick  children and many missed days of school. Children may catch as many as 10 "colds" or "flus" per year during which they can be tearful, cranky, and uncomfortable. The goal of treating a virus is aimed at keeping the ill person comfortable. Antibiotics are medications used to help the body fight bacterial infections. There are relatively few types of bacteria that cause infections but there are hundreds of viruses. While both viruses and bacteria cause  infection they are very different types of germs. A viral infection will typically go away by itself within 7 to 10 days. Bacterial infections may spread or get worse without antibiotic treatment. Examples of bacterial infections are:  Sore throats (like strep throat or tonsillitis).   Infection in the lung (pneumonia).   Ear and skin infections.  Examples of viral infections are:  Colds or flus.   Most coughs and bronchitis.   Sore throats not caused by Strep.   Runny noses.  It is often best not to take an antibiotic when a viral infection is the cause of the problem. Antibiotics can kill off the helpful bacteria that we have inside our body and allow harmful bacteria to start growing. Antibiotics can cause side effects such as allergies, nausea, and diarrhea without helping to improve the symptoms of the viral infection. Additionally, repeated uses of antibiotics can cause bacteria inside of our body to become resistant. That resistance can be passed onto harmful bacterial. The next time you have an infection it may be harder to treat if antibiotics are used when they are not needed. Not treating with antibiotics allows our own immune system to develop and take care of infections more efficiently. Also, antibiotics will work better for [place] when they are prescribed for bacterial infections. Treatments for a child that is ill may include:  Give extra fluids throughout the day to stay hydrated.   Get plenty of rest.   Only give your child over-the-counter or prescription medicines for pain, discomfort, or fever as directed by your caregiver.   The use of a cool mist humidifier may help stuffy noses.   Cold medications if suggested by your caregiver.  Your caregiver may decide to start you on an antibiotic if:  The problem you were seen for today continues for a longer length of time than expected.   You develop a secondary bacterial infection.  SEEK MEDICAL CARE IF:  Fever lasts  longer than 5 days.   Symptoms continue to get worse after 5 to 7 days or become severe.   Difficulty in breathing develops.   Signs of dehydration develop (poor drinking, rare urinating, dark colored urine).   Changes in behavior or worsening tiredness (listlessness or lethargy).  Document Released: 02/25/2002 Document Revised: 08/29/2011 Document Reviewed: 08/24/2009 [facility] Patient Information 2012 [place].Antibiotic Nonuse  Your caregiver felt that the infection or problem was not one that would be helped with an antibiotic. Infections may be caused by viruses or bacteria. Only a caregiver can tell which one of these is the likely cause of an illness. A cold is the most common cause of infection in both adults and children. A cold is a virus. Antibiotic treatment will have no effect on a viral infection. Viruses can lead to many lost days of work caring for sick children and many missed days of school. Children may catch as many as 10 "colds" or "flus" per year during which they can be tearful, cranky, and uncomfortable. The goal of treating a virus is aimed at  keeping the ill person comfortable. Antibiotics are medications used to help the body fight bacterial infections. There are relatively few types of bacteria that cause infections but there are hundreds of viruses. While both viruses and bacteria cause infection they are very different types of germs. A viral infection will typically go away by itself within 7 to 10 days. Bacterial infections may spread or get worse without antibiotic treatment. Examples of bacterial infections are:  Sore throats (like strep throat or tonsillitis).   Infection in the lung (pneumonia).   Ear and skin infections.  Examples of viral infections are:  Colds or flus.   Most coughs and bronchitis.   Sore throats not caused by Strep.   Runny noses.  It is often best not to take an antibiotic when a viral infection is the cause of the problem.  Antibiotics can kill off the helpful bacteria that we have inside our body and allow harmful bacteria to start growing. Antibiotics can cause side effects such as allergies, nausea, and diarrhea without helping to improve the symptoms of the viral infection. Additionally, repeated uses of antibiotics can cause bacteria inside of our body to become resistant. That resistance can be passed onto harmful bacterial. The next time you have an infection it may be harder to treat if antibiotics are used when they are not needed. Not treating with antibiotics allows our own immune system to develop and take care of infections more efficiently. Also, antibiotics will work better for [place] when they are prescribed for bacterial infections. Treatments for a child that is ill may include:  Give extra fluids throughout the day to stay hydrated.   Get plenty of rest.   Only give your child over-the-counter or prescription medicines for pain, discomfort, or fever as directed by your caregiver.   The use of a cool mist humidifier may help stuffy noses.   Cold medications if suggested by your caregiver.  Your caregiver may decide to start you on an antibiotic if:  The problem you were seen for today continues for a longer length of time than expected.   You develop a secondary bacterial infection.  SEEK MEDICAL CARE IF:  Fever lasts longer than 5 days.   Symptoms continue to get worse after 5 to 7 days or become severe.   Difficulty in breathing develops.   Signs of dehydration develop (poor drinking, rare urinating, dark colored urine).   Changes in behavior or worsening tiredness (listlessness or lethargy).  Document Released: 02/25/2002 Document Revised: 08/29/2011 Document Reviewed: 08/24/2009 [facility] Patient Information 2012 [place].

## 2012-02-18 NOTE — ED Provider Notes (Addendum)
History     CSN: [id number]  Arrival date & time 02/18/12  1818   First MD Initiated Contact with Patient 02/18/12 2000      Chief Complaint  Patient presents with  . Cough    (Consider location/radiation/quality/duration/timing/severity/associated sxs/prior treatment) Patient is a 78 y.o. female presenting with cough. The history is provided by the patient.  Cough This is a new problem. The current episode started 2 days ago. The problem occurs every few minutes. The problem has not changed since onset.The cough is productive of sputum (sputum is clear). There has been no fever. Associated symptoms comments: She has a burning or "raw" feeling in the center of her chest. Treatments tried: her inhaler. The treatment provided mild relief.   The patient has no weakness, dizziness, nausea, vomiting, back or leg pain.  Past Medical History  Diagnosis Date  . Pulmonary embolism     Past Surgical History  Procedure Date  . Replacement total knee bilateral   . Cholecystectomy     No family history on file.  History  Substance Use Topics  . Smoking status: Never Smoker   . Smokeless tobacco: Not on file  . Alcohol Use: Yes     very little    OB History    Grav Para Term Preterm Abortions TAB SAB Ect Mult Living                  Review of Systems  Respiratory: Positive for cough.   All other systems reviewed and are negative.    Allergies  Penicillins and Prednisone (pak)  Home Medications   Current Outpatient Rx  Name Route Sig Dispense Refill  . ALBUTEROL SULFATE HFA 108 (90 BASE) MCG/ACT IN AERS Inhalation Inhale 2 puffs into the lungs every 4 (four) hours as needed.      . ASPIRIN 81 MG PO TABS Oral Take 81 mg by mouth daily.      Marland Kitchen EXCEDRIN PO Oral Take 1 tablet by mouth daily as needed.     . BUPROPION HCL ER (SR) 150 MG PO TB12 Oral Take 150 mg by mouth 2 (two) times daily.      Marland Kitchen FLUOXETINE HCL 20 MG PO TABS Oral Take 20 mg by mouth daily.      Marland Kitchen  FLUTICASONE-SALMETEROL 100-50 MCG/DOSE IN AEPB Inhalation Inhale 1 puff into the lungs 2 (two) times daily.    Marland Kitchen LORAZEPAM 1 MG PO TABS Oral Take 1 mg by mouth at bedtime.    Marland Kitchen VITAMIN B-12 100 MCG PO TABS Oral Take 50 mcg by mouth daily.       BP 127/61  Pulse 87  Temp(Src) 98.1 F (36.7 C) (Oral)  Resp 18  SpO2 96%  Physical Exam  Nursing note and vitals reviewed. Constitutional: She is oriented to person, place, and time. She appears well-developed and well-nourished. No distress.  HENT:  Head: Normocephalic and atraumatic.  Eyes: Conjunctivae and EOM are normal. Pupils are equal, round, and reactive to light.  Neck: Normal range of motion and phonation normal. Neck supple.  Cardiovascular: Normal rate, regular rhythm and intact distal pulses.        No JVD  Pulmonary/Chest: Effort normal. No respiratory distress. She has no wheezes. She has no rales. She exhibits no tenderness.       Somewhat decreased expiratory air movement bilaterally.  Abdominal: Soft. She exhibits no distension. There is no tenderness. There is no guarding.  Musculoskeletal: Normal range of motion.  Neurological:  She is alert and oriented to person, place, and time. She has normal strength. She exhibits normal muscle tone.  Skin: Skin is warm and dry.  Psychiatric: She has a normal mood and affect. Her behavior is normal. Judgment and thought content normal.    ED Course  Procedures (including critical care time) Initial ED treatment: Nebulizer and prednisone. Patient to be moved to the clinical decision unit for further assessment and treatment.  Initial assessment is consistent with bronchitis. Doubt ACS, PE, pneumonia, serious bacterial illness or metabolic instability.        Labs Reviewed  CBC  DIFFERENTIAL  BASIC METABOLIC PANEL  URINALYSIS, ROUTINE W REFLEX MICROSCOPIC  URINE CULTURE  D-DIMER, QUANTITATIVE   No results found.   No diagnosis found.    MDM   Medical screening  examination/treatment/procedure(s) were conducted as a shared visit with non-physician practitioner(s) and myself.  I personally evaluated the patient during the encounter       Richarda Blade, MD 02/18/12 [id number]  Richarda Blade, MD 02/19/12 [phone]

## 2012-02-18 NOTE — ED Provider Notes (Signed)
10:27 PM Patient is in CDU holding for CXR, d-dimer (CT angio chest now as d-dimer elevated).  Pt with cough, SOB, chest soreness x 3 days.  No needs at this time. States nebulizer treatment has helped.  On exam, pt is A&Ox4, NAD, RRR, lungs with good air movement bilaterally, left basilar rales, no wheezes.  Husband is bedside.  CT pending.  Will continue to follow.   11:51 PM CT chest shows no PE, no pneumonia.  Patient to be d/c home with tx for bronchitis per my discussion with Dr Eulis Foster.  Discussed this with patient.  Pt states her breathing is much better now, declines any further medications here. Patient verbalizes understanding and agrees with plan.    Otis Brace, [place] 02/18/12 2352

## 2012-02-18 NOTE — ED Provider Notes (Signed)
History     CSN: [id number]  Arrival date & time 02/18/12  1633   First MD Initiated Contact with Patient 02/18/12 1720      Chief Complaint  Patient presents with  . Chest Pain    (Consider location/radiation/quality/duration/timing/severity/associated sxs/prior treatment) HPI Comments: 78 year old nonsmoker female with history of asthma and remote history of pulmonary embolism. Here complaining of retrosternal pain with left-sided radiation since last night. Worse with coughing. Barking cough nonproductive. Associated chest tightness and shortness of breath. No fever or chills. No wheezing. No diaphoresis. No dizziness or syncope. No leg swelling. No PND. Patient states blood clots around to her family her father also had a history of pulmonary embolism. Also complaining of swelling and tenderness in the right fourth digit after a fall 4 days ago. No bruising no skin lacerations or abrasions.   No past medical history on file.  No past surgical history on file.  No family history on file.  History  Substance Use Topics  . Smoking status: Never Smoker   . Smokeless tobacco: Not on file  . Alcohol Use: Yes    OB History    Grav Para Term Preterm Abortions TAB SAB Ect Mult Living                  Review of Systems  Constitutional: Negative for fever and chills.  Respiratory: Positive for cough, chest tightness and shortness of breath. Negative for wheezing.   Cardiovascular: Positive for chest pain and palpitations. Negative for leg swelling.  Skin: Negative for rash.  Neurological: Negative for dizziness.    Allergies  Penicillins  Home Medications   Current Outpatient Rx  Name Route Sig Dispense Refill  . ALBUTEROL SULFATE HFA 108 (90 BASE) MCG/ACT IN AERS Inhalation Inhale 2 puffs into the lungs every 4 (four) hours as needed.      . ASPIRIN 81 MG PO TABS Oral Take 81 mg by mouth daily.      Marland Kitchen EXCEDRIN PO Oral Take by mouth as needed.      . BUPROPION HCL ER  (SR) 150 MG PO TB12 Oral Take 150 mg by mouth 2 (two) times daily.      Marland Kitchen VITAMIN D3 2000 UNITS PO TABS Oral Take by mouth.      . FLUOXETINE HCL 20 MG PO TABS Oral Take 20 mg by mouth daily.      Marland Kitchen FLUTICASONE-SALMETEROL 100-50 MCG/DOSE IN AEPB Inhalation Inhale 1 puff into the lungs 2 (two) times daily. 14 each 0  . VITAMIN B-12 100 MCG PO TABS Oral Take 50 mcg by mouth daily.        BP 122/69  Pulse 94  Temp(Src) 99.2 F (37.3 C) (Oral)  Resp 16  SpO2 97%  Physical Exam  Nursing note and vitals reviewed. Constitutional: She is oriented to person, place, and time.  HENT:  Head: Normocephalic and atraumatic.  Neck: No JVD present.  Cardiovascular: Normal rate, regular rhythm and normal heart sounds.  Exam reveals no gallop and no friction rub.   Pulmonary/Chest: Breath sounds normal. No respiratory distress. She has no wheezes. She has no rales.       Bronchitis cough  Abdominal: Soft. She exhibits no distension.  Lymphadenopathy:    She has no cervical adenopathy.  Neurological: She is alert and oriented to person, place, and time.  Skin: No rash noted.    ED Course  Procedures (including critical care time)  Labs Reviewed - No data to display  No results found.   1. Shortness of breath   2. Chest pain       MDM  78 y/o non smoker female h/o asthma and PE in the past here c/o chest pain and shortness of breath since last night. Persistent. On exam mild tachycardia, no respiratory distress, O2 Sat 97%. EKG: Sinus rithm with no ST changes or other signs of acute ischemia. Decided to transfer to the emergency department for further evaluations. Patient will be transferred by shuttle. Xrays has been canceled. No medications administered here.          Randa Spike, MD 02/20/12 1257

## 2012-02-18 NOTE — ED Notes (Signed)
C/o chest tightness from sternal to mid thoracic area since yesterday; hist of PE; also has pain in left index finger from a fall

## 2012-02-19 LAB — URINE CULTURE
Colony Count: NO GROWTH
Culture  Setup Time: 201302182244
Culture: NO GROWTH

## 2012-02-19 NOTE — ED Provider Notes (Signed)
Medical screening examination/treatment/procedure(s) were conducted as a shared visit with non-physician practitioner(s) and myself.  I personally evaluated the patient during the encounter  Richarda Blade, MD 02/19/12 1515

## 2012-02-21 ENCOUNTER — Ambulatory Visit (INDEPENDENT_AMBULATORY_CARE_PROVIDER_SITE_OTHER): Payer: Medicare Other | Admitting: Family Medicine

## 2012-02-21 ENCOUNTER — Encounter: Payer: Self-pay | Admitting: Family Medicine

## 2012-02-21 VITALS — BP 100/60 | HR 95 | Temp 98.3°F | Wt 169.2 lb

## 2012-02-21 DIAGNOSIS — J209 Acute bronchitis, unspecified: Secondary | ICD-10-CM

## 2012-02-21 DIAGNOSIS — J40 Bronchitis, not specified as acute or chronic: Secondary | ICD-10-CM

## 2012-02-21 MED ORDER — ALBUTEROL SULFATE (5 MG/ML) 0.5% IN NEBU
2.5000 mg | INHALATION_SOLUTION | Freq: Once | RESPIRATORY_TRACT | Status: AC
  Administered 2012-02-21: 2.5 mg via RESPIRATORY_TRACT

## 2012-02-21 MED ORDER — GUAIFENESIN-CODEINE 100-10 MG/5ML PO SYRP: ORAL_SOLUTION | ORAL | Status: DC

## 2012-02-21 MED ORDER — AZITHROMYCIN 250 MG PO TABS: ORAL_TABLET | ORAL | Status: AC

## 2012-02-21 NOTE — Progress Notes (Signed)
  Subjective:     Faith Edwards is a 78 y.o. female here for evaluation of a cough. Onset of symptoms was several days ago. Symptoms have been gradually worsening since that time. The cough is productive and is aggravated by exercise and reclining position. Associated symptoms include: shortness of breath, sputum production and wheezing. Patient does have a history of asthma. Patient does have a history of environmental allergens. Patient has not traveled recently. Patient does not have a history of smoking. Patient has not had a previous chest x-ray. Patient has not had a PPD done.  The following portions of the patient's history were reviewed and updated as appropriate: allergies, current medications, past family history, past medical history, past social history, past surgical history and problem list.  Review of Systems Pertinent items are noted in HPI.    Objective:    Oxygen saturation 96% on room air BP 100/60  Pulse 95  Temp(Src) 98.3 F (36.8 C) (Oral)  Wt 169 lb 3.2 oz (76.749 kg)  SpO2 96% General appearance: alert, cooperative, appears stated age and no distress Ears: normal TM's and external ear canals both ears Nose: green discharge, mild congestion, turbinates red, swollen, sinus tenderness bilateral Throat: lips, mucosa, and tongue normal; teeth and gums normal Neck: no adenopathy, supple, symmetrical, trachea midline and thyroid not enlarged, symmetric, no tenderness/mass/nodules Lungs: diminished breath sounds bilaterally Extremities: extremities normal, atraumatic, no cyanosis or edema    Assessment:    Acute Bronchitis    Plan:    Antibiotics per medication orders. Antitussives per medication orders. Avoid exposure to tobacco smoke and fumes. B-agonist inhaler. Call if shortness of breath worsens, blood in sputum, change in character of cough, development of fever or chills, inability to maintain nutrition and hydration. Avoid exposure to tobacco smoke and  fumes. Steroid inhaler as ordered.

## 2012-02-21 NOTE — Patient Instructions (Signed)

## 2012-02-27 DIAGNOSIS — D1801 Hemangioma of skin and subcutaneous tissue: Secondary | ICD-10-CM | POA: Diagnosis not present

## 2012-02-27 DIAGNOSIS — L819 Disorder of pigmentation, unspecified: Secondary | ICD-10-CM | POA: Diagnosis not present

## 2012-02-27 DIAGNOSIS — L570 Actinic keratosis: Secondary | ICD-10-CM | POA: Diagnosis not present

## 2012-02-27 DIAGNOSIS — L280 Lichen simplex chronicus: Secondary | ICD-10-CM | POA: Diagnosis not present

## 2012-02-27 DIAGNOSIS — D239 Other benign neoplasm of skin, unspecified: Secondary | ICD-10-CM | POA: Diagnosis not present

## 2012-02-27 DIAGNOSIS — Z85828 Personal history of other malignant neoplasm of skin: Secondary | ICD-10-CM | POA: Diagnosis not present

## 2012-02-27 DIAGNOSIS — L821 Other seborrheic keratosis: Secondary | ICD-10-CM | POA: Diagnosis not present

## 2012-03-07 ENCOUNTER — Ambulatory Visit (INDEPENDENT_AMBULATORY_CARE_PROVIDER_SITE_OTHER): Payer: Medicare Other | Admitting: Internal Medicine

## 2012-03-07 ENCOUNTER — Encounter: Payer: Self-pay | Admitting: Internal Medicine

## 2012-03-07 VITALS — BP 118/64 | HR 88 | Temp 97.6°F | Ht 67.5 in | Wt 171.4 lb

## 2012-03-07 DIAGNOSIS — J45909 Unspecified asthma, uncomplicated: Secondary | ICD-10-CM | POA: Diagnosis not present

## 2012-03-07 DIAGNOSIS — R05 Cough: Secondary | ICD-10-CM | POA: Diagnosis not present

## 2012-03-07 DIAGNOSIS — R059 Cough, unspecified: Secondary | ICD-10-CM | POA: Diagnosis not present

## 2012-03-07 MED ORDER — BECLOMETHASONE DIPROPIONATE 40 MCG/ACT IN AERS: 2.0000 | INHALATION_SPRAY | Freq: Two times a day (BID) | RESPIRATORY_TRACT | Status: AC

## 2012-03-07 NOTE — Progress Notes (Signed)
  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  Has lots of sinus congestion > better pUses netti pot, sputum is clear, went to er with exac now 90% better and back to baseline cough productive of clear mucus day > night worse p walks dog assoc with loss of breath during severe cough but otherwise well preserved activity tolerance  Sleeping ok without nocturnal  or early am exacerbation  of respiratory  c/o's or need for noct saba. Also denies any obvious fluctuation of symptoms with weather or environmental changes or other aggravating or alleviating factors except as outlined above   Review of Systems  Constitutional: Negative for fever and unexpected weight change.  HENT: Positive for ear pain, congestion, sore throat, rhinorrhea, sneezing, trouble swallowing and postnasal drip. Negative for nosebleeds, dental problem and sinus pressure.   Eyes: Negative for redness and itching.  Respiratory: Positive for cough, chest tightness, shortness of breath and wheezing.   Cardiovascular: Negative for palpitations and leg swelling.  Gastrointestinal: Negative for nausea and vomiting.  Genitourinary: Negative for dysuria.  Musculoskeletal: Negative for joint swelling.  Skin: Negative for rash.  Neurological: Positive for headaches.  Hematological: Does not bruise/bleed easily.  Psychiatric/Behavioral: Positive for dysphoric mood. The patient is nervous/anxious.        Objective:   Physical Exam  Anxious amb wf nad Wt 171  03/07/12 HEENT: mod non specific turbinate edema, nl dentition  and orophanx. Nl external ear canals without cough reflex   NECK :  without JVD/Nodes/TM/ nl carotid upstrokes bilaterally   LUNGS: no acc muscle use, clear to A and P bilaterally without cough on insp or exp maneuvers   CV:  RRR  no s3 or murmur or increase in P2, no edema   ABD:  soft and nontender with nl excursion in the supine position. No bruits or organomegaly, bowel sounds nl  MS:  warm without deformities, calf tenderness, cyanosis or clubbing  SKIN: warm and dry without lesions    NEURO:  alert, approp, no deficits   Ct 02/18/12 1. No pulmonary emboli or acute abnormality.  2. Mild bronchitic changes.      Assessment & Plan:

## 2012-03-07 NOTE — Assessment & Plan Note (Signed)
DDX of  difficult airways managment all start with A and  include Adherence, Ace Inhibitors, Acid Reflux, Active Sinus Disease, Alpha 1 Antitripsin deficiency, Anxiety masquerading as Airways dz,  ABPA,  allergy(esp in young), Aspiration (esp in elderly), Adverse effects of DPI,  Active smokers, plus two Bs  = Bronchiectasis and Beta blocker use..and one C= CHF  Adherence is always the initial "prime suspect" and is a multilayered concern that requires a "trust but verify" approach in every patient - starting with knowing how to use medications, especially inhalers, correctly, keeping up with refills and understanding the fundamental difference between maintenance and prns vs those medications only taken for a very short course and then stopped and not refilled.   The proper method of use, as well as anticipated side effects, of this metered-dose inhaler are discussed and demonstrated to the patient. Improved to 75% with coaching so worth trying qvar 40 2bid  ? Adverse effect of advair > try off x 3 weeks then regroup ?acid reflux > gerd rx and diet reviewed

## 2012-03-07 NOTE — Patient Instructions (Signed)
Stop advair Only use your albuterol as a rescue medication to be used if you can't catch your breath by resting or doing a relaxed purse lip breathing pattern. The less you use it, the better it will work when you need it.  Start Qvar 40 Take 2 puffs first thing in am and then another 2 puffs about 12 hours later.    Work on inhaler technique:  relax and gently blow all the way out then take a nice smooth deep breath back in, triggering the inhaler at same time you start breathing in.  Hold for up to 5 seconds if you can.  Rinse and gargle with water when done   If your mouth or throat starts to bother you,   I suggest you time the inhaler to your dental care and after using the inhaler(s) brush teeth and tongue with a baking soda containing toothpaste and when you rinse this out, gargle with it first to see if this helps your mouth and throat.     Try prilosec 20mg   Take 30-60 min before first meal of the day and Pepcid 20 mg one bedtime until cough is completely gone for at least a week without the need for cough suppression  I think of reflux for chronic cough like I do oxygen for fire (doesn't cause the fire but once you get the oxygen suppressed it usually goes away regardless of the exact cause).   GERD (REFLUX)  is an extremely common cause of respiratory symptoms, many times with no significant heartburn at all.    It can be treated with medication, but also with lifestyle changes including avoidance of late meals, excessive alcohol, smoking cessation, and avoid fatty foods, chocolate, peppermint, colas, red wine, and acidic juices such as orange juice.  NO MINT OR MENTHOL PRODUCTS SO NO COUGH DROPS  USE SUGARLESS CANDY INSTEAD (jolley ranchers or Stover's)  NO OIL BASED VITAMINS - use powdered substitutes.   Please schedule a follow up office visit in 3 weeks, sooner if needed

## 2012-03-20 DIAGNOSIS — F331 Major depressive disorder, recurrent, moderate: Secondary | ICD-10-CM | POA: Diagnosis not present

## 2012-03-20 DIAGNOSIS — F4323 Adjustment disorder with mixed anxiety and depressed mood: Secondary | ICD-10-CM | POA: Diagnosis not present

## 2012-03-26 DIAGNOSIS — H40009 Preglaucoma, unspecified, unspecified eye: Secondary | ICD-10-CM | POA: Diagnosis not present

## 2012-03-31 ENCOUNTER — Encounter: Payer: Self-pay | Admitting: Internal Medicine

## 2012-03-31 ENCOUNTER — Ambulatory Visit (INDEPENDENT_AMBULATORY_CARE_PROVIDER_SITE_OTHER): Payer: Medicare Other | Admitting: Internal Medicine

## 2012-03-31 VITALS — BP 124/70 | HR 97 | Temp 98.1°F | Ht 67.5 in | Wt 172.8 lb

## 2012-03-31 DIAGNOSIS — R05 Cough: Secondary | ICD-10-CM

## 2012-03-31 DIAGNOSIS — R059 Cough, unspecified: Secondary | ICD-10-CM

## 2012-03-31 DIAGNOSIS — J45909 Unspecified asthma, uncomplicated: Secondary | ICD-10-CM | POA: Diagnosis not present

## 2012-03-31 NOTE — Progress Notes (Signed)
  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  Has lots of sinus congestion > better pUses netti pot, sputum is clear, went to er with exac now 90% better and back to baseline cough productive of clear mucus day > night worse p walks dog assoc with loss of breath during severe cough but otherwise well preserved activity tolerance. rec Stop advair Only use your albuterol as a rescue medication to be used if you can't catch your breath by resting or doing a relaxed purse lip breathing pattern. The less you use it, the better it will work when you need it.  Start Qvar 40 Take 2 puffs first thing in am and then another 2 puffs about 12 hours later.    Work on inhaler technique:   Try prilosec 20mg   Take 30-60 min before first meal of the day and Pepcid 20 mg one bedtime until cough is completely gone for at least a week without the need for cough suppression GERD diet   04/01/2012 f/u ov/Faith Edwards cc cough almost completely gone, no sob or need for daytime saba, no sinus or overt hb symptoms.  Sleeping ok without nocturnal  or early am exacerbation  of respiratory  c/o's or need for noct saba. Also denies any obvious fluctuation of symptoms with weather or environmental changes or other aggravating or alleviating factors except as outlined above.  ROS  At present neg for  any significant sore throat, dysphagia, dental problems, itching, sneezing,  nasal congestion or excess/ purulent secretions, ear ache,   fever, chills, sweats,  unintended wt loss, pleuritic or exertional cp, hemoptysis, palpitations, orthopnea pnd or leg swelling.  Also denies presyncope, palpitations, heartburn, abdominal pain, anorexia, nausea, vomiting, diarrhea  or change in bowel or urinary habits, change in stools or urine, dysuria,hematuria,  rash, arthralgias, visual complaints, headache, numbness weakness or ataxia or problems with walking or coordination. No noted change in mood/affect or memory.                         Objective:   Physical Exam  Anxious amb wf nad Wt 171 03/07/12 > 172 03/31/2012  HEENT: mod non specific turbinate edema, nl dentition  and orophanx. Nl external ear canals without cough reflex   NECK :  without JVD/Nodes/TM/ nl carotid upstrokes bilaterally   LUNGS: no acc muscle use, clear to A and P bilaterally without cough on insp or exp maneuvers   CV:  RRR  no s3 or murmur or increase in P2, no edema   ABD:  soft and nontender with nl excursion in the supine position. No bruits or organomegaly, bowel sounds nl  MS:  warm without deformities, calf tenderness, cyanosis or clubbing     Ct 02/18/12 1. No pulmonary emboli or acute abnormality.  2. Mild bronchitic changes.      Assessment & Plan:

## 2012-03-31 NOTE — Patient Instructions (Signed)
If you are satisfied with your treatment plan let your doctor know and he/she can either refill your medications or you can return here when your prescription runs out.     If in any way you are not 100% satisfied,  please tell us.  If 100% better, tell your friends!  

## 2012-03-31 NOTE — Assessment & Plan Note (Signed)
The proper method of use, as well as anticipated side effects, of a metered-dose inhaler are discussed and demonstrated to the patient. Improved effectiveness after extensive coaching during this visit to a level of approximately  90%  All goals of chronic asthma control met including optimal function and elimination of symptoms with minimal need for rescue therapy.  Contingencies discussed in full including contacting this office immediately if not controlling the symptoms using the rule of two's.    

## 2012-04-01 NOTE — Assessment & Plan Note (Signed)
.  Chronic cough is often simultaneously caused by more than one condition. A single cause has been found from 38 to 82% of the time, multiple causes from 18 to 62%. Multiply caused cough has been the result of three diseases up to 42% of the time.     Not clear whether she's better off advair or on qvar or to what extent aggressive gerd rx helped but doing so well advised to continue present rx x 3 months baseline then taper gerd rx.

## 2012-04-08 DIAGNOSIS — F4323 Adjustment disorder with mixed anxiety and depressed mood: Secondary | ICD-10-CM | POA: Diagnosis not present

## 2012-05-06 ENCOUNTER — Telehealth: Payer: Self-pay | Admitting: Internal Medicine

## 2012-05-06 NOTE — Telephone Encounter (Signed)
Caller: Amylia/Patient; PCP: Unice Cobble; CB#: [phone] regarding Fatigue, Short of Breath.  Severe Right Shoulder Pain On 05/03/12 following reflux on 05/02/12.  Denies any injury and states the Shoulder pain was spontaneous and lasted approx 15 minutes.  Emergent sx ruled out.  Home care and parameters for callback given.  Appt. with Dr. Linna Darner on 05/07/12 at 1200 per Chest Pain protocol.

## 2012-05-07 ENCOUNTER — Encounter: Payer: Self-pay | Admitting: Internal Medicine

## 2012-05-07 ENCOUNTER — Ambulatory Visit (INDEPENDENT_AMBULATORY_CARE_PROVIDER_SITE_OTHER): Payer: Medicare Other | Admitting: Internal Medicine

## 2012-05-07 VITALS — BP 122/80 | HR 88 | Temp 98.4°F | Wt 170.2 lb

## 2012-05-07 DIAGNOSIS — Z8249 Family history of ischemic heart disease and other diseases of the circulatory system: Secondary | ICD-10-CM | POA: Diagnosis not present

## 2012-05-07 DIAGNOSIS — M25519 Pain in unspecified shoulder: Secondary | ICD-10-CM | POA: Diagnosis not present

## 2012-05-07 DIAGNOSIS — R5383 Other fatigue: Secondary | ICD-10-CM

## 2012-05-07 DIAGNOSIS — R0602 Shortness of breath: Secondary | ICD-10-CM

## 2012-05-07 DIAGNOSIS — R5381 Other malaise: Secondary | ICD-10-CM

## 2012-05-07 DIAGNOSIS — M25511 Pain in right shoulder: Secondary | ICD-10-CM

## 2012-05-07 LAB — BASIC METABOLIC PANEL
BUN: 27 mg/dL — ABNORMAL HIGH (ref 6–23)
CO2: 27 mEq/L (ref 19–32)
Chloride: 105 mEq/L (ref 96–112)
Creatinine, Ser: 1.1 mg/dL (ref 0.4–1.2)
Potassium: 4.1 mEq/L (ref 3.5–5.1)

## 2012-05-07 LAB — HEPATIC FUNCTION PANEL
ALT: 19 U/L (ref 0–35)
Total Protein: 6.8 g/dL (ref 6.0–8.3)

## 2012-05-07 LAB — CBC WITH DIFFERENTIAL/PLATELET
Basophils Relative: 0.6 % (ref 0.0–3.0)
Eosinophils Absolute: 0.1 10*3/uL (ref 0.0–0.7)
Eosinophils Relative: 1.2 % (ref 0.0–5.0)
HCT: 38 % (ref 36.0–46.0)
Lymphs Abs: 2.2 10*3/uL (ref 0.7–4.0)
MCHC: 34 g/dL (ref 30.0–36.0)
MCV: 91.9 fl (ref 78.0–100.0)
Monocytes Absolute: 0.7 10*3/uL (ref 0.1–1.0)
Neutrophils Relative %: 54.7 % (ref 43.0–77.0)
Platelets: 320 10*3/uL (ref 150.0–400.0)
RBC: 4.13 Mil/uL (ref 3.87–5.11)
WBC: 6.8 10*3/uL (ref 4.5–10.5)

## 2012-05-07 LAB — TSH: TSH: 3.54 u[IU]/mL (ref 0.35–5.50)

## 2012-05-07 NOTE — Patient Instructions (Signed)
Repeat the isometric exercises discussed 4- 5 times prior to standing if you've been seated for a period of time.  Please try to go on My Chart within the next 24 hours to allow me to release the results directly to you.  If the shoulder pain recurs; be seen  immediately at an urgent care or ER.

## 2012-05-07 NOTE — Progress Notes (Signed)
Subjective:    Patient ID: Faith Edwards, female    DOB: 10-17-1932, 78 y.o.   MRN: [id number]  HPI  3 weeks ago she had the onset of fatigue which has persisted. She stopped her Prozac 8 weeks ago with her Psychiatrist's permission.Afte that she had increased energy & was very physically active because of the change in energy level.  On 05/03/12 she was walking when she developed sharp/throbbing right shoulder pain. This radiated to the elbow and to the neck. There was no associated trigger or injury. It lasted approximately 15 minutes. It resolved spontaneously after taking aspirin. He is not believe the aspirin relieved the pain. She did have associated nausea but no diaphoresis. There has been no recurrence since 5/4. She has chronic shortness of breath and is followed in the [facility] pulmonary clinic. She denies significant cough or sputum production. She's had no pleuritic chest pain or hemoptysis.  Past medical history/family history/social history were all reviewed and updated. Pertinent data: S/P DVT X 2 & PTE X 1. Strong family history of coronary artery disease and sudden death Review of Systems  Both fatigue @ rest   & with exertion   Primarily  physical fatigue rather than motivational  Fever/ chills : no Night sweats: no                                                                                           Vision changes ( blurred/ double/ loss): no                                                                                                 Hoarseness or swallowing dysfunction: no                                                                                          Bowel changes( constipation/ diarrhea): loose stool                                                                                      Weight change: stable Exertional  chest pain: no Dyspnea on exertion:chronically   New medications: no Leg swelling: no PND:no  Melena/ rectal bleeding: no Adenopathy:  no Severe snoring:no   Skin / hair / nail changes: no  Temperature intolerance( heat/ cold) : heat intolerance                                                                                                  Feeling depressed:no   Altered appetite: {no Poor sleep/ Apnea :no unless ERD active Abnormal bruising / bleeding or enlarged lymph nodes: no                                                                          PMH/ FH of thyroid disease: no   She describes some positional dizziness when standing up     Objective:   Physical Exam Gen.: Thin but  healthy and well-nourished in appearance. Alert, appropriate and cooperative throughout exam. Head: Normocephalic without obvious abnormalities; hair fine Eyes: No corneal or conjunctival inflammation noted.Ptosis . Extraocular motion intact. No lod lag or proptosis   Mouth: Oral mucosa and oropharynx reveal no lesions or exudates. Teeth in good repair. Neck: No deformities, masses, or tenderness noted. Range of motion & Thyroid  normal Lungs: Normal respiratory effort; chest expands symmetrically. Lungs are clear to auscultation without rales, wheezes, or increased work of breathing. Heart: Normal rate and rhythm. Normal S1 and S2. No gallop, click, or rub. S4 w/o  murmur. Abdomen: Bowel sounds normal; abdomen soft and nontender. No masses, organomegaly or hernias noted.Aorta palpable ; no AAA                                                                                    Musculoskeletal/extremities: No deformity or scoliosis noted of  the thoracic or lumbar spine. No clubbing, cyanosis, edema noted. Range of motion  normal; no crepitus or limitation noted at the right shoulder .Tone & strength  normal.DIP osteoarthritic finger changes  Nail health  good. Vascular: Carotid, radial artery, dorsalis pedis and  posterior tibial pulses are full and equal. No bruits present.Homan's negative Neurologic: Alert and oriented x3. Deep tendon  reflexes symmetrical and normal.          Skin: Intact without suspicious lesions or rashes. Lymph: No cervical, axillary lymphadenopathy present. Psych: Mood and affect are normal. Normally interactive  Assessment & Plan:  #1 fatigue  #2 right shoulder pain 5/4, resolved without recurrence  #3 strong family history of coronary disease/sudden death. EKG is normal with no ischemic changes.  Plan: See orders and recommendations

## 2012-05-08 ENCOUNTER — Encounter: Payer: Self-pay | Admitting: Internal Medicine

## 2012-05-12 ENCOUNTER — Telehealth: Payer: Self-pay | Admitting: Internal Medicine

## 2012-05-12 DIAGNOSIS — J309 Allergic rhinitis, unspecified: Secondary | ICD-10-CM

## 2012-05-12 NOTE — Telephone Encounter (Signed)
Pt returned call. She asks that nurse call back "around 4:40 because she has to walk her dog now". Mariann Laster

## 2012-05-12 NOTE — Telephone Encounter (Signed)
Pt states she was seen recently by MW for bronchitis; pt is wondering if its more allergies or ENT issue-she states she is having sinus troubles, headaches, and ear pain. MW please advise if you feel patient needs referral to either type of MD and provide DX code to use with referral. Thanks.

## 2012-05-12 NOTE — Telephone Encounter (Signed)
LMTCB

## 2012-05-12 NOTE — Telephone Encounter (Signed)
rec sinus ct and then decide ent vs allergy eval depending on results

## 2012-05-13 NOTE — Telephone Encounter (Signed)
Called spoke with patient, advised of MW's recs regarding a sinus CT first.  Pt okay with these recs and verbalized her understanding.  Order placed.  Nothing further needed at this time, will sign off and forward to MW as Juluis Rainier about sinus CT order.

## 2012-05-15 ENCOUNTER — Other Ambulatory Visit: Payer: Medicare Other

## 2012-05-19 ENCOUNTER — Ambulatory Visit (INDEPENDENT_AMBULATORY_CARE_PROVIDER_SITE_OTHER)
Admission: RE | Admit: 2012-05-19 | Discharge: 2012-05-19 | Disposition: A | Discharge status: Home / Self Care | Payer: Medicare Other | Source: Ambulatory Visit | Attending: Internal Medicine | Admitting: Internal Medicine

## 2012-05-19 ENCOUNTER — Encounter: Payer: Self-pay | Admitting: Internal Medicine

## 2012-05-19 DIAGNOSIS — J309 Allergic rhinitis, unspecified: Secondary | ICD-10-CM

## 2012-05-19 DIAGNOSIS — J42 Unspecified chronic bronchitis: Secondary | ICD-10-CM | POA: Diagnosis not present

## 2012-05-19 DIAGNOSIS — G43909 Migraine, unspecified, not intractable, without status migrainosus: Secondary | ICD-10-CM | POA: Diagnosis not present

## 2012-05-20 NOTE — Progress Notes (Signed)
Quick Note:  Spoke with pt and notified of results per Dr. Wert. Pt verbalized understanding and denied any questions.  ______ 

## 2012-05-21 DIAGNOSIS — F331 Major depressive disorder, recurrent, moderate: Secondary | ICD-10-CM | POA: Diagnosis not present

## 2012-05-22 ENCOUNTER — Encounter: Payer: Medicare Other | Admitting: Physician Assistant

## 2012-05-28 ENCOUNTER — Encounter: Payer: Medicare Other | Admitting: Physician Assistant

## 2012-06-09 ENCOUNTER — Telehealth: Payer: Self-pay | Admitting: *Deleted

## 2012-06-09 ENCOUNTER — Ambulatory Visit (INDEPENDENT_AMBULATORY_CARE_PROVIDER_SITE_OTHER): Payer: Medicare Other | Admitting: Physician Assistant

## 2012-06-09 ENCOUNTER — Encounter: Payer: Self-pay | Admitting: Physician Assistant

## 2012-06-09 DIAGNOSIS — M25519 Pain in unspecified shoulder: Secondary | ICD-10-CM | POA: Diagnosis not present

## 2012-06-09 DIAGNOSIS — M25511 Pain in right shoulder: Secondary | ICD-10-CM

## 2012-06-09 DIAGNOSIS — R0602 Shortness of breath: Secondary | ICD-10-CM

## 2012-06-09 DIAGNOSIS — Z8249 Family history of ischemic heart disease and other diseases of the circulatory system: Secondary | ICD-10-CM

## 2012-06-09 NOTE — Procedures (Signed)
Faith Edwards is a 78 y.o. female with a h/o asthma and prior pulmonary embolism referred by her PCP for ETT for right shoulder pain. She has a strong FHx of CAD. Never been a smoker. No hx of DM, HTN, HL.  Exam unremarkable.    Exercise Treadmill Test  Pre-Exercise Testing Evaluation Rhythm: sinus tachycardia  Rate: 107   PR:  .16 QRS:  .07  QT:  .33 QTc: .43     Test  Exercise Tolerance Test Ordering MD: Unice Cobble , MD  Interpreting MD: Richardson Dopp PA-C  Unique Test No: 1  Treadmill:  1  Indication for ETT: chest pain - rule out ischemia  Contraindication to ETT: No   Stress Modality: exercise - treadmill  Cardiac Imaging Performed: non   Protocol: standard Bruce - maximal  Max BP:  171/71  Max MPHR (bpm):  142 85% MPR (bpm):  120  MPHR obtained (bpm):  142 % MPHR obtained:  100%  Reached 85% MPHR (min:sec):  0:30 Total Exercise Time (min-sec):  2:39  Workload in METS:  4.6 Borg Scale: 14  Reason ETT Terminated:  desired heart rate attained    ST Segment Analysis At Rest: non-specific ST segment slurring With Exercise: non-specific ST changes  Other Information Arrhythmia:  No Angina during ETT:  absent (0) Quality of ETT:  diagnostic  ETT Interpretation:  normal - no evidence of ischemia by ST analysis  Comments: Poor exercise tolerance. No chest pain. Normal BP response to exercise. No ST-T changes to suggest ischemia.   Recommendations: Follow up with PCP as directed. Richardson Dopp, PA-C  2:46 PM 06/09/2012

## 2012-06-09 NOTE — Telephone Encounter (Signed)
Message copied by Marylen Ponto on Mon Jun 09, 2012  5:17 PM ------      Message from: Hendricks Limes      Created: Mon Jun 09, 2012  3:26 PM       Stress test was negative for ischemia. No contraindication to  a regular exercise program such as walking .Build up gradually over 6-8 weeks  to 30-45 minutes 3-4 times a week .

## 2012-06-09 NOTE — Telephone Encounter (Signed)
Discuss with patient  

## 2012-06-19 ENCOUNTER — Telehealth: Payer: Self-pay | Admitting: Internal Medicine

## 2012-06-19 NOTE — Telephone Encounter (Signed)
Opened in error. BC °

## 2012-06-20 ENCOUNTER — Encounter: Payer: Self-pay | Admitting: Internal Medicine

## 2012-06-20 ENCOUNTER — Ambulatory Visit (INDEPENDENT_AMBULATORY_CARE_PROVIDER_SITE_OTHER): Payer: Medicare Other | Admitting: Internal Medicine

## 2012-06-20 VITALS — BP 120/70 | HR 95 | Temp 98.2°F | Wt 171.0 lb

## 2012-06-20 DIAGNOSIS — R42 Dizziness and giddiness: Secondary | ICD-10-CM | POA: Diagnosis not present

## 2012-06-20 DIAGNOSIS — H9319 Tinnitus, unspecified ear: Secondary | ICD-10-CM

## 2012-06-20 DIAGNOSIS — H9313 Tinnitus, bilateral: Secondary | ICD-10-CM

## 2012-06-20 MED ORDER — MECLIZINE HCL 25 MG PO TABS: ORAL_TABLET | ORAL | Status: DC

## 2012-06-20 NOTE — Patient Instructions (Addendum)
.  Share results with specialist

## 2012-06-20 NOTE — Progress Notes (Signed)
  Subjective:    Patient ID: Faith Edwards, female    DOB: 1932/04/28, 78 y.o.   MRN: [id number]  HPI She now has had dizziness which has been constant for several months. She must hold on to someone when ambulating or even when cooking. The symptoms are "24/7" without specific trigger, but turning her head in any direction can make it worse. She also has symptoms turning over in bed.  Past medical history/family history/social history were all reviewed and updated.  Labs 05/07/12 reviewed. Slight dehydration suggested by BUN of  27 and GFR 53.25. All other labs within normal limits.    Review of Systems Straining & pain are not triggers. Cardiac prodrome: no associated palpitations, irregular rhythm, heart rate change, chest pain, dyspnea Neurologic prodrome: no headache, numbness and tingling, weakness.Definite change in coordination; "swaying" to either direction. No falls to date. Syncope:no Seizure activity:no Upper respiratory tract infection/extrinsic symptoms:no Visual change (blurred/double/loss):no Hearing loss/tinnitus:ringing X 10 years but now louder Nausea/sweating:nausea only  Treatment/response:no better with antihistamine       Objective:   Physical Exam  Gen. appearance: thin but well-nourished, in no distress Eyes: Extraocular motion intact, field of vision normal, vision grossly decreased OD ("old lenses"), no nystagmus ENT: Canals clear, tympanic membranes normal, tuning fork exam normal, hearing grossly normal Neck: Normal range of motion, no masses, normal thyroid Cardiovascular: Rate and rhythm normal; no murmurs, gallops . S 4 Muscle skeletal: Osteoarthritic DIP finger changes, tone, &  strength normal Neuro:no cranial nerve deficit, deep tendon  reflexes normal, gait slightly broad. No tremor. Romberg negative. Past-pointing with  finger-nose testing Lymph: No cervical or axillary LA Skin: Warm and dry without suspicious lesions or rashes Psych: no anxiety or  mood change. Normally interactive and cooperative.         Assessment & Plan:  #1 constant subjective dizziness; slight past pointing with finger to nose testing  #2 chronic tinnitus, subjectively worse recently  Plan: A trial of meclizine will be provided. ENT assessment would be recommended because of the history of worsening tinnitus. Tilt table exam may be clinically beneficial.

## 2012-06-26 ENCOUNTER — Telehealth: Payer: Self-pay | Admitting: Internal Medicine

## 2012-06-26 NOTE — Telephone Encounter (Signed)
Discuss with patient  

## 2012-06-26 NOTE — Telephone Encounter (Signed)
An option is Bonine OTC.  Please tell her the palms were fantastic; they ripened beautifully

## 2012-06-26 NOTE — Telephone Encounter (Signed)
Pt called stating the Antivert has too many side effects and she does not want to take it. It caused her to have very dry mouth and constipation. She states she will go to the drug store to get something over the counter.

## 2012-07-01 DIAGNOSIS — R42 Dizziness and giddiness: Secondary | ICD-10-CM | POA: Diagnosis not present

## 2012-07-09 DIAGNOSIS — F4323 Adjustment disorder with mixed anxiety and depressed mood: Secondary | ICD-10-CM | POA: Diagnosis not present

## 2012-07-10 ENCOUNTER — Other Ambulatory Visit: Payer: Self-pay | Admitting: Otolaryngology

## 2012-07-11 ENCOUNTER — Other Ambulatory Visit: Payer: Self-pay | Admitting: Otolaryngology

## 2012-07-11 DIAGNOSIS — R42 Dizziness and giddiness: Secondary | ICD-10-CM

## 2012-07-21 ENCOUNTER — Ambulatory Visit
Admission: RE | Admit: 2012-07-21 | Discharge: 2012-07-21 | Disposition: A | Discharge status: Home / Self Care | Payer: Medicare Other | Source: Ambulatory Visit | Attending: Otolaryngology | Admitting: Otolaryngology

## 2012-07-21 DIAGNOSIS — R269 Unspecified abnormalities of gait and mobility: Secondary | ICD-10-CM | POA: Diagnosis not present

## 2012-07-21 DIAGNOSIS — R42 Dizziness and giddiness: Secondary | ICD-10-CM

## 2012-07-21 DIAGNOSIS — J322 Chronic ethmoidal sinusitis: Secondary | ICD-10-CM | POA: Diagnosis not present

## 2012-07-21 MED ORDER — GADOBENATE DIMEGLUMINE 529 MG/ML IV SOLN
15.0000 mL | Freq: Once | INTRAVENOUS | Status: AC | PRN
  Administered 2012-07-21: 15 mL via INTRAVENOUS

## 2012-07-23 DIAGNOSIS — F4323 Adjustment disorder with mixed anxiety and depressed mood: Secondary | ICD-10-CM | POA: Diagnosis not present

## 2012-08-19 DIAGNOSIS — F331 Major depressive disorder, recurrent, moderate: Secondary | ICD-10-CM | POA: Diagnosis not present

## 2012-08-28 DIAGNOSIS — M25579 Pain in unspecified ankle and joints of unspecified foot: Secondary | ICD-10-CM | POA: Diagnosis not present

## 2012-09-23 DIAGNOSIS — M25579 Pain in unspecified ankle and joints of unspecified foot: Secondary | ICD-10-CM | POA: Diagnosis not present

## 2012-10-20 DIAGNOSIS — F331 Major depressive disorder, recurrent, moderate: Secondary | ICD-10-CM | POA: Diagnosis not present

## 2012-11-11 DIAGNOSIS — Z23 Encounter for immunization: Secondary | ICD-10-CM | POA: Diagnosis not present

## 2012-12-19 DIAGNOSIS — F331 Major depressive disorder, recurrent, moderate: Secondary | ICD-10-CM | POA: Diagnosis not present

## 2013-01-21 DIAGNOSIS — M542 Cervicalgia: Secondary | ICD-10-CM | POA: Diagnosis not present

## 2013-02-14 ENCOUNTER — Other Ambulatory Visit: Payer: Self-pay

## 2013-02-18 DIAGNOSIS — M542 Cervicalgia: Secondary | ICD-10-CM | POA: Diagnosis not present

## 2013-02-28 DIAGNOSIS — M542 Cervicalgia: Secondary | ICD-10-CM | POA: Diagnosis not present

## 2013-03-03 DIAGNOSIS — M542 Cervicalgia: Secondary | ICD-10-CM | POA: Diagnosis not present

## 2013-03-07 DIAGNOSIS — M542 Cervicalgia: Secondary | ICD-10-CM | POA: Diagnosis not present

## 2013-03-09 DIAGNOSIS — F331 Major depressive disorder, recurrent, moderate: Secondary | ICD-10-CM | POA: Diagnosis not present

## 2013-03-10 DIAGNOSIS — M542 Cervicalgia: Secondary | ICD-10-CM | POA: Diagnosis not present

## 2013-03-14 DIAGNOSIS — M542 Cervicalgia: Secondary | ICD-10-CM | POA: Diagnosis not present

## 2013-03-16 DIAGNOSIS — M542 Cervicalgia: Secondary | ICD-10-CM | POA: Diagnosis not present

## 2013-03-18 DIAGNOSIS — M542 Cervicalgia: Secondary | ICD-10-CM | POA: Diagnosis not present

## 2013-03-21 DIAGNOSIS — M542 Cervicalgia: Secondary | ICD-10-CM | POA: Diagnosis not present

## 2013-03-25 DIAGNOSIS — M542 Cervicalgia: Secondary | ICD-10-CM | POA: Diagnosis not present

## 2013-04-01 DIAGNOSIS — M542 Cervicalgia: Secondary | ICD-10-CM | POA: Diagnosis not present

## 2013-04-08 DIAGNOSIS — M542 Cervicalgia: Secondary | ICD-10-CM | POA: Diagnosis not present

## 2013-04-13 DIAGNOSIS — M542 Cervicalgia: Secondary | ICD-10-CM | POA: Diagnosis not present

## 2013-05-04 DIAGNOSIS — M542 Cervicalgia: Secondary | ICD-10-CM | POA: Diagnosis not present

## 2013-05-13 DIAGNOSIS — M542 Cervicalgia: Secondary | ICD-10-CM | POA: Diagnosis not present

## 2013-05-16 DIAGNOSIS — M542 Cervicalgia: Secondary | ICD-10-CM | POA: Diagnosis not present

## 2013-05-18 DIAGNOSIS — M542 Cervicalgia: Secondary | ICD-10-CM | POA: Diagnosis not present

## 2013-05-20 DIAGNOSIS — M5030 Other cervical disc degeneration, unspecified cervical region: Secondary | ICD-10-CM | POA: Diagnosis not present

## 2013-05-20 DIAGNOSIS — M542 Cervicalgia: Secondary | ICD-10-CM | POA: Diagnosis not present

## 2013-05-23 DIAGNOSIS — M542 Cervicalgia: Secondary | ICD-10-CM | POA: Diagnosis not present

## 2013-05-28 DIAGNOSIS — M542 Cervicalgia: Secondary | ICD-10-CM | POA: Diagnosis not present

## 2013-06-03 DIAGNOSIS — M5030 Other cervical disc degeneration, unspecified cervical region: Secondary | ICD-10-CM | POA: Diagnosis not present

## 2013-06-05 DIAGNOSIS — N3941 Urge incontinence: Secondary | ICD-10-CM | POA: Diagnosis not present

## 2013-06-05 DIAGNOSIS — R350 Frequency of micturition: Secondary | ICD-10-CM | POA: Diagnosis not present

## 2013-06-05 DIAGNOSIS — R3915 Urgency of urination: Secondary | ICD-10-CM | POA: Diagnosis not present

## 2013-06-13 DIAGNOSIS — M542 Cervicalgia: Secondary | ICD-10-CM | POA: Diagnosis not present

## 2013-06-17 DIAGNOSIS — M542 Cervicalgia: Secondary | ICD-10-CM | POA: Diagnosis not present

## 2013-06-24 DIAGNOSIS — M542 Cervicalgia: Secondary | ICD-10-CM | POA: Diagnosis not present

## 2013-06-29 DIAGNOSIS — M5030 Other cervical disc degeneration, unspecified cervical region: Secondary | ICD-10-CM | POA: Diagnosis not present

## 2013-07-14 DIAGNOSIS — L821 Other seborrheic keratosis: Secondary | ICD-10-CM | POA: Diagnosis not present

## 2013-07-14 DIAGNOSIS — L84 Corns and callosities: Secondary | ICD-10-CM | POA: Diagnosis not present

## 2013-07-14 DIAGNOSIS — D485 Neoplasm of uncertain behavior of skin: Secondary | ICD-10-CM | POA: Diagnosis not present

## 2013-07-14 DIAGNOSIS — L820 Inflamed seborrheic keratosis: Secondary | ICD-10-CM | POA: Diagnosis not present

## 2013-07-14 DIAGNOSIS — B079 Viral wart, unspecified: Secondary | ICD-10-CM | POA: Diagnosis not present

## 2013-07-14 DIAGNOSIS — Z85828 Personal history of other malignant neoplasm of skin: Secondary | ICD-10-CM | POA: Diagnosis not present

## 2013-07-29 DIAGNOSIS — M5030 Other cervical disc degeneration, unspecified cervical region: Secondary | ICD-10-CM | POA: Diagnosis not present

## 2013-07-29 DIAGNOSIS — M542 Cervicalgia: Secondary | ICD-10-CM | POA: Diagnosis not present

## 2013-08-03 DIAGNOSIS — F331 Major depressive disorder, recurrent, moderate: Secondary | ICD-10-CM | POA: Diagnosis not present

## 2013-08-05 ENCOUNTER — Other Ambulatory Visit: Payer: Self-pay

## 2013-08-05 DIAGNOSIS — R3915 Urgency of urination: Secondary | ICD-10-CM | POA: Diagnosis not present

## 2013-09-23 DIAGNOSIS — M542 Cervicalgia: Secondary | ICD-10-CM | POA: Diagnosis not present

## 2013-09-23 DIAGNOSIS — M5030 Other cervical disc degeneration, unspecified cervical region: Secondary | ICD-10-CM | POA: Diagnosis not present

## 2013-09-23 DIAGNOSIS — M4802 Spinal stenosis, cervical region: Secondary | ICD-10-CM | POA: Diagnosis not present

## 2013-10-20 DIAGNOSIS — M47812 Spondylosis without myelopathy or radiculopathy, cervical region: Secondary | ICD-10-CM | POA: Diagnosis not present

## 2013-10-29 DIAGNOSIS — F331 Major depressive disorder, recurrent, moderate: Secondary | ICD-10-CM | POA: Diagnosis not present

## 2013-11-03 DIAGNOSIS — Z23 Encounter for immunization: Secondary | ICD-10-CM | POA: Diagnosis not present

## 2013-11-05 ENCOUNTER — Other Ambulatory Visit: Payer: Self-pay

## 2013-11-05 DIAGNOSIS — F331 Major depressive disorder, recurrent, moderate: Secondary | ICD-10-CM | POA: Diagnosis not present

## 2013-11-17 DIAGNOSIS — F331 Major depressive disorder, recurrent, moderate: Secondary | ICD-10-CM | POA: Diagnosis not present

## 2013-11-18 DIAGNOSIS — M542 Cervicalgia: Secondary | ICD-10-CM | POA: Diagnosis not present

## 2013-11-26 ENCOUNTER — Encounter: Payer: Self-pay | Admitting: Internal Medicine

## 2013-11-26 DIAGNOSIS — M4802 Spinal stenosis, cervical region: Secondary | ICD-10-CM | POA: Insufficient documentation

## 2013-11-30 HISTORY — PX: OTHER SURGICAL HISTORY: SHX169

## 2013-12-01 DIAGNOSIS — F331 Major depressive disorder, recurrent, moderate: Secondary | ICD-10-CM | POA: Diagnosis not present

## 2013-12-09 DIAGNOSIS — M542 Cervicalgia: Secondary | ICD-10-CM | POA: Diagnosis not present

## 2013-12-14 DIAGNOSIS — F331 Major depressive disorder, recurrent, moderate: Secondary | ICD-10-CM | POA: Diagnosis not present

## 2013-12-20 ENCOUNTER — Encounter: Payer: Self-pay | Admitting: Internal Medicine

## 2014-01-04 DIAGNOSIS — L570 Actinic keratosis: Secondary | ICD-10-CM | POA: Diagnosis not present

## 2014-01-04 DIAGNOSIS — D485 Neoplasm of uncertain behavior of skin: Secondary | ICD-10-CM | POA: Diagnosis not present

## 2014-01-04 DIAGNOSIS — L821 Other seborrheic keratosis: Secondary | ICD-10-CM | POA: Diagnosis not present

## 2014-01-04 DIAGNOSIS — D239 Other benign neoplasm of skin, unspecified: Secondary | ICD-10-CM | POA: Diagnosis not present

## 2014-01-04 DIAGNOSIS — Z85828 Personal history of other malignant neoplasm of skin: Secondary | ICD-10-CM | POA: Diagnosis not present

## 2014-01-06 ENCOUNTER — Telehealth: Payer: Self-pay

## 2014-01-06 DIAGNOSIS — M542 Cervicalgia: Secondary | ICD-10-CM | POA: Diagnosis not present

## 2014-01-06 NOTE — Telephone Encounter (Signed)
Medication and allergies:  Reviewed and updated  90 day supply/mail order: na Local pharmacy: [facility]   Immunizations due:  PNA, shingles, Tdap  A/P:   No changes to FH or personal hx MMG--2011 Bone Density-- none on file [facility]--08/2009--Dr Stark--neg  To Discuss with Provider: Need for Tdap and PNA vaccines

## 2014-01-07 ENCOUNTER — Encounter: Payer: Self-pay | Admitting: Internal Medicine

## 2014-01-07 ENCOUNTER — Ambulatory Visit (INDEPENDENT_AMBULATORY_CARE_PROVIDER_SITE_OTHER): Payer: Medicare Other | Admitting: Internal Medicine

## 2014-01-07 VITALS — BP 137/78 | HR 85 | Temp 97.8°F | Ht 66.75 in | Wt 170.4 lb

## 2014-01-07 DIAGNOSIS — J45909 Unspecified asthma, uncomplicated: Secondary | ICD-10-CM | POA: Diagnosis not present

## 2014-01-07 DIAGNOSIS — Z0000 Encounter for general adult medical examination without abnormal findings: Secondary | ICD-10-CM | POA: Diagnosis not present

## 2014-01-07 DIAGNOSIS — R5383 Other fatigue: Secondary | ICD-10-CM | POA: Diagnosis not present

## 2014-01-07 DIAGNOSIS — J0110 Acute frontal sinusitis, unspecified: Secondary | ICD-10-CM | POA: Diagnosis not present

## 2014-01-07 DIAGNOSIS — Z23 Encounter for immunization: Secondary | ICD-10-CM

## 2014-01-07 DIAGNOSIS — M810 Age-related osteoporosis without current pathological fracture: Secondary | ICD-10-CM | POA: Diagnosis not present

## 2014-01-07 DIAGNOSIS — R5381 Other malaise: Secondary | ICD-10-CM | POA: Diagnosis not present

## 2014-01-07 LAB — CBC WITH DIFFERENTIAL/PLATELET
Basophils Absolute: 0 10*3/uL (ref 0.0–0.1)
Basophils Relative: 0.4 % (ref 0.0–3.0)
EOS PCT: 6.1 % — AB (ref 0.0–5.0)
Eosinophils Absolute: 0.5 10*3/uL (ref 0.0–0.7)
HCT: 39.3 % (ref 36.0–46.0)
HEMOGLOBIN: 13.3 g/dL (ref 12.0–15.0)
Lymphocytes Relative: 24.1 % (ref 12.0–46.0)
Lymphs Abs: 2 10*3/uL (ref 0.7–4.0)
MCHC: 33.9 g/dL (ref 30.0–36.0)
MCV: 91.7 fl (ref 78.0–100.0)
MONOS PCT: 10.4 % (ref 3.0–12.0)
Monocytes Absolute: 0.9 10*3/uL (ref 0.1–1.0)
Neutro Abs: 5 10*3/uL (ref 1.4–7.7)
Neutrophils Relative %: 59 % (ref 43.0–77.0)
PLATELETS: 335 10*3/uL (ref 150.0–400.0)
RBC: 4.28 Mil/uL (ref 3.87–5.11)
RDW: 13.6 % (ref 11.5–14.6)
WBC: 8.5 10*3/uL (ref 4.5–10.5)

## 2014-01-07 LAB — BASIC METABOLIC PANEL
BUN: 20 mg/dL (ref 6–23)
CO2: 25 mEq/L (ref 19–32)
Calcium: 9.2 mg/dL (ref 8.4–10.5)
Chloride: 104 mEq/L (ref 96–112)
Creatinine, Ser: 1 mg/dL (ref 0.4–1.2)
GFR: 57.37 mL/min — AB (ref >60.00)
Glucose, Bld: 98 mg/dL (ref 70–99)
POTASSIUM: 4 meq/L (ref 3.5–5.1)
SODIUM: 139 meq/L (ref 135–145)

## 2014-01-07 LAB — TSH: TSH: 4.09 u[IU]/mL (ref 0.35–5.50)

## 2014-01-07 MED ORDER — SULFAMETHOXAZOLE-TMP DS 800-160 MG PO TABS: 1.0000 | ORAL_TABLET | Freq: Two times a day (BID) | ORAL | Status: DC

## 2014-01-07 NOTE — Progress Notes (Signed)
Pre visit review using our clinic review tool, if applicable. No additional management support is needed unless otherwise documented below in the visit note. 

## 2014-01-07 NOTE — Patient Instructions (Signed)
Your next office appointment will be determined based upon review of your pending labs &/ or x-rays. Those instructions will be transmitted to you through My Chart  or by mail if you're not using this system.  Plain Mucinex (NOT D) for thick secretions ;force NON dairy fluids .   Nasal cleansing in the shower as discussed with lather of mild shampoo.After 10 seconds wash off lather while  exhaling through nostrils. Make sure that all residual soap is removed to prevent irritation.  Nasacort AQ OTC 1 spray in each nostril twice a day as needed. Use the "crossover" technique into opposite nostril spraying toward opposite ear @ 45 degree angle, not straight up into nostril.  Use a Neti pot daily only  as needed for significant sinus congestion; going from open side to congested side . Plain Allegra (NOT D )  160 daily , Loratidine 10 mg , OR Zyrtec 10 mg @ bedtime  as needed for itchy eyes & sneezing.
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
## 2014-01-11 DIAGNOSIS — M542 Cervicalgia: Secondary | ICD-10-CM | POA: Diagnosis not present

## 2014-01-13 LAB — VITAMIN D 1,25 DIHYDROXY
Vitamin D 1, 25 (OH)2 Total: 77 pg/mL — ABNORMAL HIGH (ref 18–72)
Vitamin D2 1, 25 (OH)2: <8 pg/mL
Vitamin D3 1, 25 (OH)2: 77 pg/mL

## 2014-01-18 DIAGNOSIS — M542 Cervicalgia: Secondary | ICD-10-CM | POA: Diagnosis not present

## 2014-01-19 DIAGNOSIS — M542 Cervicalgia: Secondary | ICD-10-CM | POA: Diagnosis not present

## 2014-01-19 HISTORY — PX: SP FACET INJECTION: HXRAD107

## 2014-01-22 DIAGNOSIS — R040 Epistaxis: Secondary | ICD-10-CM | POA: Diagnosis not present

## 2014-01-25 DIAGNOSIS — M542 Cervicalgia: Secondary | ICD-10-CM | POA: Diagnosis not present

## 2014-01-27 DIAGNOSIS — F331 Major depressive disorder, recurrent, moderate: Secondary | ICD-10-CM | POA: Diagnosis not present

## 2014-02-01 DIAGNOSIS — M542 Cervicalgia: Secondary | ICD-10-CM | POA: Diagnosis not present

## 2014-02-02 DIAGNOSIS — M542 Cervicalgia: Secondary | ICD-10-CM | POA: Diagnosis not present

## 2014-02-14 ENCOUNTER — Encounter: Payer: Self-pay | Admitting: Internal Medicine

## 2014-02-27 DIAGNOSIS — M542 Cervicalgia: Secondary | ICD-10-CM | POA: Diagnosis not present

## 2014-03-17 DIAGNOSIS — M542 Cervicalgia: Secondary | ICD-10-CM | POA: Diagnosis not present

## 2014-04-08 DIAGNOSIS — F331 Major depressive disorder, recurrent, moderate: Secondary | ICD-10-CM | POA: Diagnosis not present

## 2014-04-20 DIAGNOSIS — M4802 Spinal stenosis, cervical region: Secondary | ICD-10-CM | POA: Diagnosis not present

## 2014-04-20 DIAGNOSIS — M5030 Other cervical disc degeneration, unspecified cervical region: Secondary | ICD-10-CM | POA: Diagnosis not present

## 2014-04-27 DIAGNOSIS — M47812 Spondylosis without myelopathy or radiculopathy, cervical region: Secondary | ICD-10-CM | POA: Diagnosis not present

## 2014-04-27 DIAGNOSIS — Z6826 Body mass index (BMI) 26.0-26.9, adult: Secondary | ICD-10-CM | POA: Diagnosis not present

## 2014-04-28 DIAGNOSIS — M47812 Spondylosis without myelopathy or radiculopathy, cervical region: Secondary | ICD-10-CM | POA: Diagnosis not present

## 2014-04-28 DIAGNOSIS — M5030 Other cervical disc degeneration, unspecified cervical region: Secondary | ICD-10-CM | POA: Diagnosis not present

## 2014-04-28 DIAGNOSIS — M4802 Spinal stenosis, cervical region: Secondary | ICD-10-CM | POA: Diagnosis not present

## 2014-05-18 DIAGNOSIS — B029 Zoster without complications: Secondary | ICD-10-CM | POA: Diagnosis not present

## 2014-05-18 DIAGNOSIS — M4802 Spinal stenosis, cervical region: Secondary | ICD-10-CM | POA: Diagnosis not present

## 2014-05-18 DIAGNOSIS — M5030 Other cervical disc degeneration, unspecified cervical region: Secondary | ICD-10-CM | POA: Diagnosis not present

## 2014-06-09 DIAGNOSIS — M542 Cervicalgia: Secondary | ICD-10-CM | POA: Diagnosis not present

## 2014-06-09 DIAGNOSIS — M4802 Spinal stenosis, cervical region: Secondary | ICD-10-CM | POA: Diagnosis not present

## 2014-06-23 DIAGNOSIS — H18509 Unspecified hereditary corneal dystrophies, unspecified eye: Secondary | ICD-10-CM | POA: Diagnosis not present

## 2014-06-23 DIAGNOSIS — H5231 Anisometropia: Secondary | ICD-10-CM | POA: Diagnosis not present

## 2014-06-23 DIAGNOSIS — Z961 Presence of intraocular lens: Secondary | ICD-10-CM | POA: Diagnosis not present

## 2014-06-23 DIAGNOSIS — H52229 Regular astigmatism, unspecified eye: Secondary | ICD-10-CM | POA: Diagnosis not present

## 2014-07-01 DIAGNOSIS — F331 Major depressive disorder, recurrent, moderate: Secondary | ICD-10-CM | POA: Diagnosis not present

## 2014-07-20 DIAGNOSIS — N390 Urinary tract infection, site not specified: Secondary | ICD-10-CM | POA: Diagnosis not present

## 2014-07-21 DIAGNOSIS — M4802 Spinal stenosis, cervical region: Secondary | ICD-10-CM | POA: Diagnosis not present

## 2014-07-30 ENCOUNTER — Other Ambulatory Visit (INDEPENDENT_AMBULATORY_CARE_PROVIDER_SITE_OTHER): Payer: Medicare Other

## 2014-07-30 ENCOUNTER — Other Ambulatory Visit: Payer: Medicare Other

## 2014-07-30 ENCOUNTER — Ambulatory Visit (INDEPENDENT_AMBULATORY_CARE_PROVIDER_SITE_OTHER): Payer: Medicare Other | Admitting: Internal Medicine

## 2014-07-30 ENCOUNTER — Encounter: Payer: Self-pay | Admitting: Internal Medicine

## 2014-07-30 VITALS — BP 120/60 | HR 115 | Temp 98.1°F | Wt 171.6 lb

## 2014-07-30 DIAGNOSIS — R109 Unspecified abdominal pain: Secondary | ICD-10-CM

## 2014-07-30 DIAGNOSIS — R1012 Left upper quadrant pain: Secondary | ICD-10-CM

## 2014-07-30 DIAGNOSIS — R8290 Unspecified abnormal findings in urine: Secondary | ICD-10-CM

## 2014-07-30 DIAGNOSIS — K5730 Diverticulosis of large intestine without perforation or abscess without bleeding: Secondary | ICD-10-CM

## 2014-07-30 DIAGNOSIS — R82998 Other abnormal findings in urine: Secondary | ICD-10-CM | POA: Diagnosis not present

## 2014-07-30 DIAGNOSIS — R300 Dysuria: Secondary | ICD-10-CM | POA: Diagnosis not present

## 2014-07-30 LAB — CBC WITH DIFFERENTIAL/PLATELET
Basophils Absolute: 0 10*3/uL (ref 0.0–0.1)
Basophils Relative: 0.3 % (ref 0.0–3.0)
Eosinophils Absolute: 0.1 10*3/uL (ref 0.0–0.7)
Eosinophils Relative: 1.6 % (ref 0.0–5.0)
HEMATOCRIT: 38.9 % (ref 36.0–46.0)
HEMOGLOBIN: 13 g/dL (ref 12.0–15.0)
Lymphocytes Relative: 40.5 % (ref 12.0–46.0)
Lymphs Abs: 3.6 10*3/uL (ref 0.7–4.0)
MCHC: 33.4 g/dL (ref 30.0–36.0)
MCV: 94.3 fl (ref 78.0–100.0)
MONOS PCT: 11 % (ref 3.0–12.0)
Monocytes Absolute: 1 10*3/uL (ref 0.1–1.0)
NEUTROS ABS: 4.2 10*3/uL (ref 1.4–7.7)
Neutrophils Relative %: 46.6 % (ref 43.0–77.0)
Platelets: 373 10*3/uL (ref 150.0–400.0)
RBC: 4.13 Mil/uL (ref 3.87–5.11)
RDW: 13.5 % (ref 11.5–15.5)
WBC: 9 10*3/uL (ref 4.0–10.5)

## 2014-07-30 LAB — POCT URINALYSIS DIPSTICK
BILIRUBIN UA: NEGATIVE
Blood, UA: NEGATIVE
Glucose, UA: NEGATIVE
Ketones, UA: NEGATIVE
NITRITE UA: NEGATIVE
PH UA: 7
Spec Grav, UA: 1.015
UROBILINOGEN UA: 0.2

## 2014-07-30 MED ORDER — RANITIDINE HCL 150 MG PO TABS
150.0000 mg | ORAL_TABLET | Freq: Two times a day (BID) | ORAL | Status: DC
Start: 2014-07-30 — End: 2014-10-05

## 2014-07-30 NOTE — Progress Notes (Signed)
   Subjective:    Patient ID: Faith Edwards, female    DOB: 01-Jan-1932, 80 y.o.   MRN: [id number]  HPI She has a multiple concerns. Abdominal pain began one month ago and has progressed. It is localized to the left upper quadrant without radiation. It occurs approximately 30-60 minutes after she eats  She has associated nausea.  She does have chronic constipation. Her last BM was today and was described as "hard". She is not experiencing diarrhea , melena or rectal bleeding.  She does take Excedrin; she has a cup of coffee a day and 1 cup tea. She does not drink alcoholic beverages.   Past medical history includes colonoscopy 09/08/2009 which revealed mild sigmoid diverticulosis. Review of Systems  She denies hematemesis, change in weight, pyuria, or hematuria  She had nausea to sulfa Rxed by by her urologist.It also caused a rash.  She has no back pain which radiates anteriorly.       Objective:   Physical Exam Significant or distinguishing  findings on physical exam include:  P recheck 94. There is significant decreased range of motion of the cervical spine especially with right lateral rotation and also neck flexion. Breast implants present . S4 without murmurs or gallops. There is accentuation of the upper thoracic spine curvature Tenting is present.   General appearance :adequately nourished; in no distress. Eyes: No conjunctival inflammation or scleral icterus is present. Oral exam: Dental hygiene is good. Lips and gums are healthy appearing.There is no oropharyngeal erythema or exudate noted.  Heart:  Normal rate and regular rhythm. S1 and S2 normal. Lungs:Chest clear to auscultation; no wheezes, rhonchi,rales ,or rubs present.No increased work of breathing.  Abdomen: bowel sounds normal, soft and non-tender without masses, organomegaly or hernias noted.  No guarding or rebound. No flank tenderness to percussion. Skin:Warm & dry.  Intact without suspicious lesions or  rashes ; no jaundice. Lymphatic: No lymphadenopathy is noted about the head, neck, axilla           Assessment & Plan:  #1 LUQ pain in context  #2dysuria #3 PMH of diverticulosis See orders & AVS

## 2014-07-30 NOTE — Patient Instructions (Addendum)
Reflux of gastric acid may be asymptomatic as this may occur mainly during sleep.The triggers for reflux  include stress; the "aspirin family" ; alcohol; peppermint; and caffeine (coffee, tea, cola, and chocolate). The aspirin family would include aspirin and the nonsteroidal agents such as ibuprofen &  Naproxen. Tylenol would not cause reflux. If having symptoms ; food & drink should be avoided for @ least 2 hours before going to bed.  Take the acid blocker 30 minutes before breakfast and 30 minutes before the evening meal for 8 weeks then go back to once a day  30 minutes before breakfast.  Natural interventions to treat or prevent constipation would include drinking to thirst, up to 32 ounces of fluids daily; eating 7-9 servings of fresh fruits or vegetables a day; and increasing roughage in the diet such as whole grains. The OTC  fiber products (Example: Metamucil, etc) can be employed if these natural maneuvers do not correct the issue. Finally MiraLax every third day as needed is an option.

## 2014-07-30 NOTE — Progress Notes (Signed)
Pre visit review using our clinic review tool, if applicable. No additional management support is needed unless otherwise documented below in the visit note. 

## 2014-08-01 ENCOUNTER — Encounter: Payer: Self-pay | Admitting: Internal Medicine

## 2014-08-02 ENCOUNTER — Telehealth: Payer: Self-pay | Admitting: Internal Medicine

## 2014-08-02 NOTE — Telephone Encounter (Signed)
   If she is not using my chart; we need to close it. The blood count was completely normal. Urine culture is still pending  Ranitidine 150 mg before breakfast and before the needles; dispense 60 refill x2 was 2 minutes and the drugstore.

## 2014-08-02 NOTE — Telephone Encounter (Signed)
Patient has been advised

## 2014-08-02 NOTE — Telephone Encounter (Signed)
Pt request lab result that was done on 07/30/14, pt also talk to [facility] about medication that we send in, [facility] stated that they do not have anything from our office at all. Please check, pt can not get into my chart. [phone]

## 2014-08-03 LAB — URINE CULTURE: Colony Count: 40000

## 2014-08-04 ENCOUNTER — Telehealth: Payer: Self-pay

## 2014-08-04 ENCOUNTER — Other Ambulatory Visit: Payer: Self-pay | Admitting: Internal Medicine

## 2014-08-04 DIAGNOSIS — N3090 Cystitis, unspecified without hematuria: Secondary | ICD-10-CM

## 2014-08-04 MED ORDER — LEVOFLOXACIN 500 MG PO TABS: 500.0000 mg | ORAL_TABLET | Freq: Every day | ORAL | Status: DC

## 2014-08-04 NOTE — Telephone Encounter (Signed)
Message copied by Shelly Coss on Wed Aug 04, 2014  8:20 AM ------      Message from: Hendricks Limes      Created: Wed Aug 04, 2014  7:39 AM       See C&S results; Rx sent to pharmacy.Please verify that results are being checked through My Chart. If  not using My Chart; results will be mailed & My Chart should be closed.      Please FAX office visit & lab results to  [facility]. ------

## 2014-08-04 NOTE — Telephone Encounter (Signed)
Patient has been advised of results and to pick up antibiotic. Office visit and labs have been faxed.

## 2014-08-20 DIAGNOSIS — R82998 Other abnormal findings in urine: Secondary | ICD-10-CM | POA: Diagnosis not present

## 2014-08-20 DIAGNOSIS — R350 Frequency of micturition: Secondary | ICD-10-CM | POA: Diagnosis not present

## 2014-08-30 DIAGNOSIS — F331 Major depressive disorder, recurrent, moderate: Secondary | ICD-10-CM | POA: Diagnosis not present

## 2014-10-05 ENCOUNTER — Ambulatory Visit (INDEPENDENT_AMBULATORY_CARE_PROVIDER_SITE_OTHER): Payer: Medicare Other | Admitting: Internal Medicine

## 2014-10-05 ENCOUNTER — Encounter: Payer: Self-pay | Admitting: Internal Medicine

## 2014-10-05 VITALS — BP 140/82 | HR 93 | Temp 98.1°F | Resp 16 | Ht 67.0 in | Wt 172.2 lb

## 2014-10-05 DIAGNOSIS — J4520 Mild intermittent asthma, uncomplicated: Secondary | ICD-10-CM

## 2014-10-05 DIAGNOSIS — Z23 Encounter for immunization: Secondary | ICD-10-CM | POA: Diagnosis not present

## 2014-10-05 DIAGNOSIS — J310 Chronic rhinitis: Secondary | ICD-10-CM | POA: Diagnosis not present

## 2014-10-05 DIAGNOSIS — R059 Cough, unspecified: Secondary | ICD-10-CM

## 2014-10-05 DIAGNOSIS — R05 Cough: Secondary | ICD-10-CM | POA: Diagnosis not present

## 2014-10-05 DIAGNOSIS — F32A Depression, unspecified: Secondary | ICD-10-CM

## 2014-10-05 DIAGNOSIS — F329 Major depressive disorder, single episode, unspecified: Secondary | ICD-10-CM

## 2014-10-05 DIAGNOSIS — M4802 Spinal stenosis, cervical region: Secondary | ICD-10-CM

## 2014-10-05 DIAGNOSIS — Z86718 Personal history of other venous thrombosis and embolism: Secondary | ICD-10-CM

## 2014-10-05 NOTE — Patient Instructions (Signed)
The lump in your stomach is a hernia which is not dangerous and does not need to be fixed. If it gets stuck out and you have pain call us but otherwise the way to keep it from growing is to minimize straining to pass bowel movements.   We are not going to change your medicines today and do not need to check blood work.   Come back in about 1 year or sooner if you have problems or questions.   Ventral Hernia A ventral hernia (also called an incisional hernia) is a hernia that occurs at the site of a previous surgical cut (incision) in the abdomen. The abdominal wall spans from your lower chest down to your pelvis. If the abdominal wall is weakened from a surgical incision, a hernia can occur. A hernia is a bulge of bowel or muscle tissue pushing out on the weakened part of the abdominal wall. Ventral hernias can get bigger from straining or lifting. Obese and older people are at higher risk for a ventral hernia. People who develop infections after surgery or require repeat incisions at the same site on the abdomen are also at increased risk. CAUSES  A ventral hernia occurs because of weakness in the abdominal wall at an incision site.  SYMPTOMS  Common symptoms include:  A visible bulge or lump on the abdominal wall.  Pain or tenderness around the lump.  Increased discomfort if you cough or make a sudden movement. If the hernia has blocked part of the intestine, a serious complication can occur (incarcerated or strangulated hernia). This can become a problem that requires emergency surgery because the blood flow to the blocked intestine may be cut off. Symptoms may include:  Feeling sick to your stomach (nauseous).  Throwing up (vomiting).  Stomach swelling (distention) or bloating.  Fever.  Rapid heartbeat. DIAGNOSIS  Your health care provider will take a medical history and perform a physical exam. Various tests may be ordered, such as:  Blood tests.  Urine  tests.  Ultrasonography.  X-rays.  Computed tomography (CT). TREATMENT  Watchful waiting may be all that is needed for a smaller hernia that does not cause symptoms. Your health care provider may recommend the use of a supportive belt (truss) that helps to keep the abdominal wall intact. For larger hernias or those that cause pain, surgery to repair the hernia is usually recommended. If a hernia becomes strangulated, emergency surgery needs to be done right away. HOME CARE INSTRUCTIONS  Avoid putting pressure or strain on the abdominal area.  Avoid heavy lifting.  Use good body positioning for physical tasks. Ask your health care provider about proper body positioning.  Use a supportive belt as directed by your health care provider.  Maintain a healthy weight.  Eat foods that are high in fiber, such as whole grains, fruits, and vegetables. Fiber helps prevent difficult bowel movements (constipation).  Drink enough fluids to keep your urine clear or pale yellow.  Follow up with your health care provider as directed. SEEK MEDICAL CARE IF:   Your hernia seems to be getting larger or more painful. SEEK IMMEDIATE MEDICAL CARE IF:   You have abdominal pain that is sudden and sharp.  Your pain becomes severe.  You have repeated vomiting.  You are sweating a lot.  You notice a rapid heartbeat.  You develop a fever. MAKE SURE YOU:   Understand these instructions.  Will watch your condition.  Will get help right away if you are not doing  well or get worse. Document Released: 12/03/2012 Document Revised: 05/03/2014 Document Reviewed: 12/03/2012 [facility] Patient Information 2015 [place], [place]. This information is not intended to replace advice given to you by your health care provider. Make sure you discuss any questions you have with your health care provider.

## 2014-10-05 NOTE — Progress Notes (Signed)
   Subjective:    Patient ID: Faith Edwards, female    DOB: Oct 17, 1932, 80 y.o.   MRN: [id number]  HPI The patient is an 80 YO female who is coming in today to establish care. She has PMH of anxiety, depression, asthma, hx PE on HRT. She is having a lump in her stomach that she wants looked at and evaluated. It is not painful and seems to bulge a little when she coughs or strains. She is doing well with her breathing for the most part. She has not used the albuterol in a long time.   Review of Systems  Constitutional: Positive for fatigue. Negative for fever, activity change and appetite change.  HENT: Positive for congestion.   Respiratory: Positive for cough. Negative for chest tightness, shortness of breath and wheezing.        Chronic, no sputum.  Cardiovascular: Negative for chest pain, palpitations and leg swelling.  Gastrointestinal: Positive for abdominal distention. Negative for abdominal pain, diarrhea and constipation.  Musculoskeletal: Positive for arthralgias and neck pain. Negative for gait problem.  Skin: Negative.   Neurological: Negative for dizziness, facial asymmetry, weakness, light-headedness and headaches.  Psychiatric/Behavioral: Positive for dysphoric mood. Negative for suicidal ideas and self-injury.      Objective:   Physical Exam  Constitutional: She is oriented to person, place, and time. She appears well-developed and well-nourished. No distress.  HENT:  Head: Normocephalic and atraumatic.  Eyes: EOM are normal.  Neck: Normal range of motion.  Cardiovascular: Normal rate and regular rhythm.   Pulmonary/Chest: Effort normal and breath sounds normal. No respiratory distress. She has no wheezes. She has no rales.  Abdominal: Soft. Bowel sounds are normal. She exhibits no distension. There is no tenderness.  Small ventral hernia superior to her scar from gallbladder surgery, easily reducible non-tender.  Neurological: She is alert and oriented to person, place,  and time. Coordination normal.  Skin: Skin is warm and dry.   Filed Vitals:   10/05/14 1115  BP: 140/82  Pulse: 93  Temp: 98.1 F (36.7 C)  TempSrc: Oral  Resp: 16  Height: 5\' 7"  (1.702 m)  Weight: 172 lb 3.2 oz (78.109 kg)  SpO2: 93%      Assessment & Plan:

## 2014-10-05 NOTE — Progress Notes (Signed)
Pre visit review using our clinic review tool, if applicable. No additional management support is needed unless otherwise documented below in the visit note. 

## 2014-10-06 NOTE — Assessment & Plan Note (Signed)
She takes OTC meds for this and doing fairly well.

## 2014-10-06 NOTE — Assessment & Plan Note (Signed)
She is off medicines at this time and has not had any problems. Observe for now.

## 2014-10-06 NOTE — Assessment & Plan Note (Signed)
Stable at this time and she has not been back to pulmonology in some time.
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
## 2014-10-06 NOTE — Assessment & Plan Note (Signed)
She continues to see Dr. Nelva Bush and per her there is nothing they can do about it and she has some pain in the area.

## 2014-10-06 NOTE — Assessment & Plan Note (Signed)
No symptoms at this time and non-smoker. No risk for clot at this time. Off anticoagulation.

## 2014-10-10 ENCOUNTER — Encounter (HOSPITAL_COMMUNITY): Payer: Self-pay | Admitting: Emergency Medicine

## 2014-10-10 ENCOUNTER — Emergency Department (HOSPITAL_COMMUNITY): Payer: Medicare Other

## 2014-10-10 ENCOUNTER — Emergency Department (HOSPITAL_COMMUNITY)
Admission: EM | Admit: 2014-10-10 | Discharge: 2014-10-10 | Disposition: A | Discharge status: Home / Self Care | Payer: Medicare Other | Attending: Emergency Medicine | Admitting: Emergency Medicine

## 2014-10-10 DIAGNOSIS — Y9301 Activity, walking, marching and hiking: Secondary | ICD-10-CM | POA: Insufficient documentation

## 2014-10-10 DIAGNOSIS — Z86711 Personal history of pulmonary embolism: Secondary | ICD-10-CM | POA: Insufficient documentation

## 2014-10-10 DIAGNOSIS — S81812A Laceration without foreign body, left lower leg, initial encounter: Secondary | ICD-10-CM | POA: Insufficient documentation

## 2014-10-10 DIAGNOSIS — Y9289 Other specified places as the place of occurrence of the external cause: Secondary | ICD-10-CM | POA: Insufficient documentation

## 2014-10-10 DIAGNOSIS — G8929 Other chronic pain: Secondary | ICD-10-CM | POA: Insufficient documentation

## 2014-10-10 DIAGNOSIS — W01198A Fall on same level from slipping, tripping and stumbling with subsequent striking against other object, initial encounter: Secondary | ICD-10-CM | POA: Insufficient documentation

## 2014-10-10 DIAGNOSIS — Z79899 Other long term (current) drug therapy: Secondary | ICD-10-CM | POA: Insufficient documentation

## 2014-10-10 DIAGNOSIS — Z23 Encounter for immunization: Secondary | ICD-10-CM | POA: Diagnosis not present

## 2014-10-10 DIAGNOSIS — S41109A Unspecified open wound of unspecified upper arm, initial encounter: Secondary | ICD-10-CM | POA: Diagnosis not present

## 2014-10-10 DIAGNOSIS — Z85828 Personal history of other malignant neoplasm of skin: Secondary | ICD-10-CM | POA: Insufficient documentation

## 2014-10-10 DIAGNOSIS — S098XXA Other specified injuries of head, initial encounter: Secondary | ICD-10-CM | POA: Diagnosis not present

## 2014-10-10 DIAGNOSIS — Z86718 Personal history of other venous thrombosis and embolism: Secondary | ICD-10-CM | POA: Insufficient documentation

## 2014-10-10 DIAGNOSIS — Z880 Allergy status to penicillin: Secondary | ICD-10-CM | POA: Diagnosis not present

## 2014-10-10 DIAGNOSIS — J45909 Unspecified asthma, uncomplicated: Secondary | ICD-10-CM | POA: Insufficient documentation

## 2014-10-10 DIAGNOSIS — Z7982 Long term (current) use of aspirin: Secondary | ICD-10-CM | POA: Insufficient documentation

## 2014-10-10 MED ORDER — CEPHALEXIN 500 MG PO CAPS: 500.0000 mg | ORAL_CAPSULE | Freq: Four times a day (QID) | ORAL | Status: DC

## 2014-10-10 MED ORDER — CEPHALEXIN 500 MG PO CAPS
500.0000 mg | ORAL_CAPSULE | Freq: Once | ORAL | Status: AC
  Administered 2014-10-10: 500 mg via ORAL
  Filled 2014-10-10: qty 1

## 2014-10-10 MED ORDER — OXYCODONE-ACETAMINOPHEN 5-325 MG PO TABS
2.0000 | ORAL_TABLET | Freq: Once | ORAL | Status: AC
  Administered 2014-10-10: 2 via ORAL
  Filled 2014-10-10: qty 2

## 2014-10-10 MED ORDER — TRAMADOL HCL 50 MG PO TABS: 50.0000 mg | ORAL_TABLET | Freq: Four times a day (QID) | ORAL | Status: DC | PRN

## 2014-10-10 MED ORDER — TETANUS-DIPHTH-ACELL PERTUSSIS 5-2.5-18.5 LF-MCG/0.5 IM SUSP
0.5000 mL | Freq: Once | INTRAMUSCULAR | Status: AC
  Administered 2014-10-10: 0.5 mL via INTRAMUSCULAR
  Filled 2014-10-10: qty 0.5

## 2014-10-10 NOTE — ED Notes (Signed)
Patient transported to X-ray 

## 2014-10-10 NOTE — Discharge Instructions (Signed)
Take keflex for a week.   Keep dressing on for 2 days or until you see your doctor.   Take tramadol as needed for pain.   Follow up with your doctor.   Return to ER if you have worse bleeding, severe pain, signs of infection.

## 2014-10-10 NOTE — ED Provider Notes (Signed)
CSN: [id number]     Arrival date & time 10/10/14  1452 History   First MD Initiated Contact with Patient 10/10/14 1500     Chief Complaint  Patient presents with  . Extremity Laceration  . Fall     (Consider location/radiation/quality/duration/timing/severity/associated sxs/prior Treatment) The history is provided by the patient.  Faith Edwards is a 80 y.o. female hx of PE on Aspirin, here with L leg injury. Was walking and tripped over a stepping stone and hit left shin on iron bench. Has some bleeding afterwards. Denies head injury or LOC. Denies passing out or other injuries. Tetanus not up to date.    Past Medical History  Diagnosis Date  . Pulmonary embolism 1993    on HRT  . Asthma   . Hx of blood clots 2007 & 2010    post TKR  . Chronic headaches     [facility]  . Skin cancer    Past Surgical History  Procedure Laterality Date  . 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 Cholecystectomy    . Colonoscopy      diverticulosis  . Esi   11/2013     C7-T1 ; Dr Nelva Bush  . Breast enhancement surgery  1985  . Sp facet injection  01/19/14    RC2-3,3-4,C4-5   Family History  Problem Relation Age of Onset  . Allergies Brother   . Asthma Brother   . Heart disease Brother   . Clotting disorder Father     PTE X 2  . Skin cancer Father   . Skin cancer Mother   . Sudden death      2 M aunts & 2 M uncles in 40s  . Heart attack Brother     > 55  . Stroke Neg Hx   . Diabetes Daughter   . Diabetes Son   . Mental illness Son     suicide   History  Substance Use Topics  . Smoking status: Never Smoker   . Smokeless tobacco: Not on file  . Alcohol Use: Yes     Comment:  rarely   OB History   Grav Para Term Preterm Abortions TAB SAB Ect Mult Living                 Review of Systems  Musculoskeletal:       L leg pain   Skin: Positive for wound.  All other systems reviewed and are negative.     Allergies  Penicillins and Sulfa antibiotics  Home  Medications   Prior to Admission medications   Medication Sig Start Date End Date Taking? Authorizing Provider  aspirin 81 MG tablet Take 81 mg by mouth daily.      Historical Provider, MD  Aspirin-Acetaminophen-Caffeine (EXCEDRIN PO) Take 1 tablet by mouth daily as needed.     Historical Provider, MD  calcium citrate-vitamin D (CITRACAL+D) 315-200 MG-UNIT per tablet Take 1 tablet by mouth daily.    Historical Provider, MD  DULoxetine (CYMBALTA) 60 MG capsule Take 60 mg by mouth daily.    Historical Provider, MD  LORazepam (ATIVAN) 1 MG tablet Take 1 mg by mouth at bedtime.    Historical Provider, MD  traMADol (ULTRAM) 50 MG tablet Take 50 mg by mouth as needed.    Historical Provider, MD  TURMERIC PO Take by mouth.    Historical Provider, MD  vitamin B-12 (CYANOCOBALAMIN) 100 MCG tablet Take 50 mcg by mouth daily. liquid  Historical Provider, MD   BP 123/81  Pulse 90  Temp(Src) 99.1 F (37.3 C) (Oral)  Resp 18  SpO2 99% Physical Exam  Nursing note and vitals reviewed. Constitutional: She is oriented to person, place, and time. She appears well-developed and well-nourished.  HENT:  Head: Normocephalic and atraumatic.  Mouth/Throat: Oropharynx is clear and moist.  Eyes: Conjunctivae are normal. Pupils are equal, round, and reactive to light.  Neck: Normal range of motion. Neck supple.  Cardiovascular: Normal rate, regular rhythm and normal heart sounds.   Pulmonary/Chest: Effort normal.  Abdominal: Soft.  Musculoskeletal:  L lateral aspect of calf with complex laceration with no active bleeding. 2+ pulses. Able to dorsiflex and plantar flex foot. Nl neurovascular exam   Neurological: She is alert and oriented to person, place, and time.  Skin: Skin is warm and dry.  Psychiatric: She has a normal mood and affect. Her behavior is normal. Judgment and thought content normal.    ED Course  Procedures (including critical care time)  The wound is cleansed, debrided of foreign  material as much as possible, and dressed. The patient is alerted to watch for any signs of infection (redness, pus, pain, increased swelling or fever) and call if such occurs. Home wound care instructions are provided. Tetanus vaccination status reviewed: Td vaccination indicated and given today.   Labs Review Labs Reviewed - No data to display  Imaging Review Dg Tibia/fibula Left  10/10/2014   CLINICAL DATA:  Fall onto iron bench today. Left leg pain and laceration. Initial encounter.  EXAM: LEFT TIBIA AND FIBULA - 2 VIEW  COMPARISON:  None.  FINDINGS: There is no evidence of fracture or other focal bone lesions. Left knee prosthesis noted. Soft tissues are unremarkable. No evidence of radiopaque foreign body.  IMPRESSION: No acute findings.   Electronically Signed   By: Earle Gell M.D.   On: 10/10/2014 15:38     EKG Interpretation None      MDM   Final diagnoses:  None    Faith Edwards is a 80 y.o. female here with L leg injury. Will get xray to r/o foreign body. tdap updated. Will clean wound.   4:14 PM Xray showed no foreign bodies. Cleaned wound, missing some tissue and there is a U shaped laceration. I was able to approximate the skin, apply petroleum dressing and then pressure dressing. Hemostasis achieved. Will d/c home with keflex empirically.      Wandra Arthurs, MD 10/10/14 [phone]

## 2014-10-10 NOTE — ED Notes (Signed)
Per EMS and pt report: pt tripped over a stepping stone and stumbled into a bench in which a rod iron punctured her left lower leg.  Pt denies LOC and denies any pain.  Pt a/o x4 and reports being at her baseline.  Pt reports taking 81mg  aspirin daily and has hx of PE.

## 2014-10-10 NOTE — ED Notes (Signed)
Bed: WA07 Expected date:  Expected time:  Means of arrival:  Comments: Fall 

## 2014-10-11 ENCOUNTER — Ambulatory Visit (INDEPENDENT_AMBULATORY_CARE_PROVIDER_SITE_OTHER): Payer: Medicare Other | Admitting: Internal Medicine

## 2014-10-11 ENCOUNTER — Encounter: Payer: Self-pay | Admitting: Internal Medicine

## 2014-10-11 VITALS — BP 100/62 | HR 108 | Temp 98.3°F | Resp 13 | Wt 168.1 lb

## 2014-10-11 DIAGNOSIS — S81812D Laceration without foreign body, left lower leg, subsequent encounter: Secondary | ICD-10-CM | POA: Diagnosis not present

## 2014-10-11 NOTE — Progress Notes (Signed)
   Subjective:    Patient ID: Faith Edwards, female    DOB: Feb 26, 1932, 80 y.o.   MRN: [id number]  HPI    Approximately 2 PM on 10/10/14 she tripped over a rock in her yard and "snagged" the lateral aspect of her left lower extremity on metal furniture. There was significant bleeding which she had difficulty stopping  She was taken by ambulance to the emergency room. Those records were reviewed.  The complex laceration was cleaned and debrided. Films revealed no foreign bodies.  She was concerned today as there had been recurrent bleeding last night. She thought she had been hypertensive in the emergency room (123/81); she was concerned that the relatively low blood pressure today might reflect blood loss.  There was no cardiac or neurologic prodrome prior to the fall.    Review of Systems  Denied were any change in heart rhythm or rate prior to the event. There was no associated chest pain or shortness of breath .  Also specifically denied prior to the episode were headache, limb weakness, tingling, or numbness. No seizure activity noted.      Objective:   Physical Exam   The left lateral extremity dressing was removed. A firm dressing saturated with dried blood was noted. There was no evidence of active bleeding. The dressing was strongly adherent to the tissues. Clinically it could not be removed without significant risk of recurrent bleeding. There was no evidence of cellulitis around the dressing.  General appearance :adequately nourished; in no distress. She appears much younger then her stated age.  Eyes: No conjunctival inflammation or scleral icterus is present.  Heart:  Normal rate and regular rhythm. S1 and S2 normal without gallop, murmur, click, rub or other extra sounds     Lungs:Chest clear to auscultation; no wheezes, rhonchi,rales ,or rubs present.No increased work of breathing.   Vascular : all pulses equal ; no bruits present.  Skin:Warm & dry.  Intact  without suspicious lesions or rashes ; no  tenting  Lymphatic: No lymphadenopathy is noted about the head, neck, axilla           Assessment & Plan:  #1 laceration w/o active bleeding Plan: [facility] referral

## 2014-10-11 NOTE — Progress Notes (Signed)
Pre visit review using our clinic review tool, if applicable. No additional management support is needed unless otherwise documented below in the visit note. 

## 2014-10-11 NOTE — Patient Instructions (Signed)
Please report warning signs as we discussed. Worrisome would be  Red streaks up leg, high fever or pus. [facility]  referral will be scheduled and you'll be notified of the time.Please call the Referral Co-Ordinator @ [phone] if you have not been notified of appointment time within 5 days.

## 2014-10-15 ENCOUNTER — Encounter (HOSPITAL_BASED_OUTPATIENT_CLINIC_OR_DEPARTMENT_OTHER): Payer: Medicare Other | Attending: General Surgery

## 2014-10-15 DIAGNOSIS — S81812D Laceration without foreign body, left lower leg, subsequent encounter: Secondary | ICD-10-CM | POA: Diagnosis not present

## 2014-10-15 DIAGNOSIS — X58XXXD Exposure to other specified factors, subsequent encounter: Secondary | ICD-10-CM | POA: Diagnosis not present

## 2014-10-22 DIAGNOSIS — S81812D Laceration without foreign body, left lower leg, subsequent encounter: Secondary | ICD-10-CM | POA: Diagnosis not present

## 2014-10-25 DIAGNOSIS — S81812D Laceration without foreign body, left lower leg, subsequent encounter: Secondary | ICD-10-CM | POA: Diagnosis not present

## 2014-10-28 DIAGNOSIS — S81812D Laceration without foreign body, left lower leg, subsequent encounter: Secondary | ICD-10-CM | POA: Diagnosis not present

## 2014-11-04 ENCOUNTER — Encounter (HOSPITAL_BASED_OUTPATIENT_CLINIC_OR_DEPARTMENT_OTHER): Payer: Medicare Other | Attending: Internal Medicine

## 2014-11-04 DIAGNOSIS — L97929 Non-pressure chronic ulcer of unspecified part of left lower leg with unspecified severity: Secondary | ICD-10-CM | POA: Insufficient documentation

## 2014-11-04 DIAGNOSIS — I872 Venous insufficiency (chronic) (peripheral): Secondary | ICD-10-CM | POA: Insufficient documentation

## 2014-11-12 DIAGNOSIS — L97929 Non-pressure chronic ulcer of unspecified part of left lower leg with unspecified severity: Secondary | ICD-10-CM | POA: Diagnosis not present

## 2014-11-12 DIAGNOSIS — I872 Venous insufficiency (chronic) (peripheral): Secondary | ICD-10-CM | POA: Diagnosis not present

## 2014-11-19 DIAGNOSIS — L97929 Non-pressure chronic ulcer of unspecified part of left lower leg with unspecified severity: Secondary | ICD-10-CM | POA: Diagnosis not present

## 2014-11-19 DIAGNOSIS — I872 Venous insufficiency (chronic) (peripheral): Secondary | ICD-10-CM | POA: Diagnosis not present

## 2014-11-22 ENCOUNTER — Ambulatory Visit (INDEPENDENT_AMBULATORY_CARE_PROVIDER_SITE_OTHER): Payer: Medicare Other | Admitting: Internal Medicine

## 2014-11-22 ENCOUNTER — Encounter: Payer: Self-pay | Admitting: Internal Medicine

## 2014-11-22 VITALS — BP 118/66 | HR 91 | Temp 98.4°F | Resp 12 | Ht 67.0 in | Wt 169.1 lb

## 2014-11-22 DIAGNOSIS — S81802D Unspecified open wound, left lower leg, subsequent encounter: Secondary | ICD-10-CM

## 2014-11-22 NOTE — Progress Notes (Signed)
Pre visit review using our clinic review tool, if applicable. No additional management support is needed unless otherwise documented below in the visit note. 

## 2014-11-22 NOTE — Patient Instructions (Signed)
We will see you back next year for your physical. Please call us back if you have any problems or questions sooner.  We would recommend that you by the little electrical unit to use on your neck for pain. We would also recommend that you continue to see Dr. Casimiro Needle as I would not be comfortable with a high doses of the lorazepam that he is giving you. If you would like me to prescribe this medication I can however likely would have to decrease her dose and try to wean you down on this medication.

## 2014-11-23 DIAGNOSIS — S81809A Unspecified open wound, unspecified lower leg, initial encounter: Secondary | ICD-10-CM | POA: Insufficient documentation

## 2014-11-23 NOTE — Assessment & Plan Note (Signed)
She will continue to follow in the wound center and return prn to this office for worsening.

## 2014-11-23 NOTE — Progress Notes (Signed)
   Subjective:    Patient ID: Faith Edwards, female    DOB: 10/28/1932, 80 y.o.   MRN: [id number]  HPI  the patient is an 80 year old female who comes in today to follow-up on the leg wound. She was seen in our clinic about one month ago and then was sent to the wound care clinic. They're working with her to heal the wound. She states it is about 30-40% healed. There is no redness, fever, drainage. She also is having some ear discomfort and wishes me to look at her ears. Denies any other new complaints.  Review of Systems  Constitutional: Positive for fatigue. Negative for fever, activity change and appetite change.  HENT: Positive for ear pain.   Respiratory: Negative for chest tightness, shortness of breath and wheezing.   Cardiovascular: Negative for chest pain, palpitations and leg swelling.  Gastrointestinal: Negative for abdominal pain, diarrhea and constipation.  Musculoskeletal: Positive for arthralgias and neck pain. Negative for gait problem.  Skin: Positive for wound.  Neurological: Negative for dizziness, facial asymmetry, weakness, light-headedness and headaches.  Psychiatric/Behavioral: Positive for dysphoric mood. Negative for suicidal ideas and self-injury.      Objective:   Physical Exam  Constitutional: She is oriented to person, place, and time. She appears well-developed and well-nourished. No distress.  HENT:  Head: Normocephalic and atraumatic.  Right Ear: External ear normal.  Left Ear: External ear normal.  Eyes: EOM are normal.  Neck: Normal range of motion.  Cardiovascular: Normal rate and regular rhythm.   Pulmonary/Chest: Effort normal and breath sounds normal. No respiratory distress. She has no wheezes. She has no rales.  Abdominal: Soft. Bowel sounds are normal. She exhibits no distension. There is no tenderness.  Neurological: She is alert and oriented to person, place, and time. Coordination normal.  Skin: Skin is warm and dry.  Wound about 40% healed  on them lateral left calf good granulation tissue, no purulent discharge, no erythema or warmth around the area.   Filed Vitals:   11/22/14 1503  BP: 118/66  Pulse: 91  Temp: 98.4 F (36.9 C)  TempSrc: Oral  Resp: 12  Height: 5\' 7"  (1.702 m)  Weight: 169 lb 1.9 oz (76.712 kg)  SpO2: 98%      Assessment & Plan:

## 2014-11-29 DIAGNOSIS — I872 Venous insufficiency (chronic) (peripheral): Secondary | ICD-10-CM | POA: Diagnosis not present

## 2014-11-29 DIAGNOSIS — L97929 Non-pressure chronic ulcer of unspecified part of left lower leg with unspecified severity: Secondary | ICD-10-CM | POA: Diagnosis not present

## 2014-12-01 DIAGNOSIS — M4802 Spinal stenosis, cervical region: Secondary | ICD-10-CM | POA: Diagnosis not present

## 2014-12-03 ENCOUNTER — Encounter (HOSPITAL_BASED_OUTPATIENT_CLINIC_OR_DEPARTMENT_OTHER): Payer: Medicare Other | Attending: Internal Medicine

## 2014-12-03 DIAGNOSIS — X58XXXD Exposure to other specified factors, subsequent encounter: Secondary | ICD-10-CM | POA: Insufficient documentation

## 2014-12-03 DIAGNOSIS — I878 Other specified disorders of veins: Secondary | ICD-10-CM | POA: Diagnosis not present

## 2014-12-03 DIAGNOSIS — S8982XD Other specified injuries of left lower leg, subsequent encounter: Secondary | ICD-10-CM | POA: Insufficient documentation

## 2014-12-03 DIAGNOSIS — M4802 Spinal stenosis, cervical region: Secondary | ICD-10-CM | POA: Diagnosis not present

## 2014-12-06 DIAGNOSIS — M4802 Spinal stenosis, cervical region: Secondary | ICD-10-CM | POA: Diagnosis not present

## 2014-12-08 DIAGNOSIS — M4802 Spinal stenosis, cervical region: Secondary | ICD-10-CM | POA: Diagnosis not present

## 2014-12-08 DIAGNOSIS — M542 Cervicalgia: Secondary | ICD-10-CM | POA: Diagnosis not present

## 2014-12-10 DIAGNOSIS — I878 Other specified disorders of veins: Secondary | ICD-10-CM | POA: Diagnosis not present

## 2014-12-10 DIAGNOSIS — M4802 Spinal stenosis, cervical region: Secondary | ICD-10-CM | POA: Diagnosis not present

## 2014-12-10 DIAGNOSIS — S8982XD Other specified injuries of left lower leg, subsequent encounter: Secondary | ICD-10-CM | POA: Diagnosis not present

## 2014-12-13 DIAGNOSIS — M4802 Spinal stenosis, cervical region: Secondary | ICD-10-CM | POA: Diagnosis not present

## 2014-12-27 DIAGNOSIS — M4802 Spinal stenosis, cervical region: Secondary | ICD-10-CM | POA: Diagnosis not present

## 2015-01-04 DIAGNOSIS — F064 Anxiety disorder due to known physiological condition: Secondary | ICD-10-CM | POA: Diagnosis not present

## 2015-01-31 ENCOUNTER — Encounter: Payer: Medicare Other | Admitting: Internal Medicine

## 2015-02-02 DIAGNOSIS — F064 Anxiety disorder due to known physiological condition: Secondary | ICD-10-CM | POA: Diagnosis not present

## 2015-03-16 ENCOUNTER — Encounter: Payer: Self-pay | Admitting: Internal Medicine

## 2015-03-16 ENCOUNTER — Other Ambulatory Visit (INDEPENDENT_AMBULATORY_CARE_PROVIDER_SITE_OTHER): Payer: Medicare Other

## 2015-03-16 ENCOUNTER — Ambulatory Visit (INDEPENDENT_AMBULATORY_CARE_PROVIDER_SITE_OTHER): Payer: Medicare Other | Admitting: Internal Medicine

## 2015-03-16 VITALS — BP 114/64 | HR 94 | Temp 98.2°F | Resp 16 | Ht 67.0 in | Wt 173.0 lb

## 2015-03-16 DIAGNOSIS — E789 Disorder of lipoprotein metabolism, unspecified: Secondary | ICD-10-CM | POA: Diagnosis not present

## 2015-03-16 DIAGNOSIS — F32A Depression, unspecified: Secondary | ICD-10-CM

## 2015-03-16 DIAGNOSIS — D510 Vitamin B12 deficiency anemia due to intrinsic factor deficiency: Secondary | ICD-10-CM

## 2015-03-16 DIAGNOSIS — J4520 Mild intermittent asthma, uncomplicated: Secondary | ICD-10-CM

## 2015-03-16 DIAGNOSIS — Z0000 Encounter for general adult medical examination without abnormal findings: Secondary | ICD-10-CM

## 2015-03-16 DIAGNOSIS — E559 Vitamin D deficiency, unspecified: Secondary | ICD-10-CM

## 2015-03-16 DIAGNOSIS — F329 Major depressive disorder, single episode, unspecified: Secondary | ICD-10-CM | POA: Diagnosis not present

## 2015-03-16 DIAGNOSIS — M4802 Spinal stenosis, cervical region: Secondary | ICD-10-CM | POA: Diagnosis not present

## 2015-03-16 DIAGNOSIS — Z79899 Other long term (current) drug therapy: Secondary | ICD-10-CM | POA: Diagnosis not present

## 2015-03-16 LAB — CBC
HCT: 38.2 % (ref 36.0–46.0)
Hemoglobin: 13 g/dL (ref 12.0–15.0)
MCHC: 33.9 g/dL (ref 30.0–36.0)
MCV: 91.2 fl (ref 78.0–100.0)
Platelets: 330 10*3/uL (ref 150.0–400.0)
RBC: 4.19 Mil/uL (ref 3.87–5.11)
RDW: 13.5 % (ref 11.5–15.5)
WBC: 6.9 10*3/uL (ref 4.0–10.5)

## 2015-03-16 LAB — COMPREHENSIVE METABOLIC PANEL
ALBUMIN: 4.1 g/dL (ref 3.5–5.2)
ALK PHOS: 91 U/L (ref 39–117)
ALT: 16 U/L (ref 0–35)
AST: 21 U/L (ref 0–37)
BUN: 19 mg/dL (ref 6–23)
CO2: 29 mEq/L (ref 19–32)
CREATININE: 1.01 mg/dL (ref 0.40–1.20)
Calcium: 9.3 mg/dL (ref 8.4–10.5)
Chloride: 104 mEq/L (ref 96–112)
GFR: 55.89 mL/min — ABNORMAL LOW (ref >60.00)
GLUCOSE: 101 mg/dL — AB (ref 70–99)
Potassium: 5.1 mEq/L (ref 3.5–5.1)
Sodium: 137 mEq/L (ref 135–145)
TOTAL PROTEIN: 6.7 g/dL (ref 6.0–8.3)
Total Bilirubin: 0.8 mg/dL (ref 0.2–1.2)

## 2015-03-16 LAB — LIPID PANEL
Cholesterol: 233 mg/dL — ABNORMAL HIGH (ref 0–200)
HDL: 66.5 mg/dL (ref >39.00)
LDL Cholesterol: 144 mg/dL — ABNORMAL HIGH (ref 0–99)
NonHDL: 166.5
Total CHOL/HDL Ratio: 4
Triglycerides: 114 mg/dL (ref 0.0–149.0)
VLDL: 22.8 mg/dL (ref 0.0–40.0)

## 2015-03-16 LAB — VITAMIN B12: Vitamin B-12: 1060 pg/mL — ABNORMAL HIGH (ref 211–911)

## 2015-03-16 LAB — VITAMIN D 25 HYDROXY (VIT D DEFICIENCY, FRACTURES): VITD: 16.96 ng/mL — ABNORMAL LOW (ref 30.00–100.00)

## 2015-03-16 MED ORDER — ALBUTEROL SULFATE HFA 108 (90 BASE) MCG/ACT IN AERS: 2.0000 | INHALATION_SPRAY | Freq: Four times a day (QID) | RESPIRATORY_TRACT | Status: DC | PRN

## 2015-03-16 NOTE — Patient Instructions (Signed)
We will check on the blood work today and call you back about the results.   You are doing well overall and should think about getting the booster pneumonia shot.   Keep up the good work with exercising and think about working on the muscles in your arms as well.   Health Maintenance Adopting a healthy lifestyle and getting preventive care can go a long way to promote health and wellness. Talk with your health care provider about what schedule of regular examinations is right for you. This is a good chance for you to check in with your provider about disease prevention and staying healthy. In between checkups, there are plenty of things you can do on your own. Experts have done a lot of research about which lifestyle changes and preventive measures are most likely to keep you healthy. Ask your health care provider for more information. WEIGHT AND DIET  Eat a healthy diet  Be sure to include plenty of vegetables, fruits, low-fat dairy products, and lean protein.  Do not eat a lot of foods high in solid fats, added sugars, or salt.  Get regular exercise. This is one of the most important things you can do for your health.  Most adults should exercise for at least 150 minutes each week. The exercise should increase your heart rate and make you sweat (moderate-intensity exercise).  Most adults should also do strengthening exercises at least twice a week. This is in addition to the moderate-intensity exercise.  Maintain a healthy weight  Body mass index (BMI) is a measurement that can be used to identify possible weight problems. It estimates body fat based on height and weight. Your health care provider can help determine your BMI and help you achieve or maintain a healthy weight.  For females 53 years of age and older:   A BMI below 18.5 is considered underweight.  A BMI of 18.5 to 24.9 is normal.  A BMI of 25 to 29.9 is considered overweight.  A BMI of 30 and above is considered  obese.  Watch levels of cholesterol and blood lipids  You should start having your blood tested for lipids and cholesterol at 79 years of age, then have this test every 5 years.  You may need to have your cholesterol levels checked more often if:  Your lipid or cholesterol levels are high.  You are older than 79 years of age.  You are at high risk for heart disease.  CANCER SCREENING   Lung Cancer  Lung cancer screening is recommended for adults 21-90 years old who are at high risk for lung cancer because of a history of smoking.  A yearly low-dose CT scan of the lungs is recommended for people who:  Currently smoke.  Have quit within the past 15 years.  Have at least a 30-pack-year history of smoking. A pack year is smoking an average of one pack of cigarettes a day for 1 year.  Yearly screening should continue until it has been 15 years since you quit.  Yearly screening should stop if you develop a health problem that would prevent you from having lung cancer treatment.  Breast Cancer  Practice breast self-awareness. This means understanding how your breasts normally appear and feel.  It also means doing regular breast self-exams. Let your health care provider know about any changes, no matter how small.  If you are in your 20s or 30s, you should have a clinical breast exam (CBE) by a health care provider  every 1-3 years as part of a regular health exam.  If you are 40 or older, have a CBE every year. Also consider having a breast X-ray (mammogram) every year.  If you have a family history of breast cancer, talk to your health care provider about genetic screening.  If you are at high risk for breast cancer, talk to your health care provider about having an MRI and a mammogram every year.  Breast cancer gene (BRCA) assessment is recommended for women who have family members with BRCA-related cancers. BRCA-related cancers  include:  Breast.  Ovarian.  Tubal.  Peritoneal cancers.  Results of the assessment will determine the need for genetic counseling and BRCA1 and BRCA2 testing. Cervical Cancer Routine pelvic examinations to screen for cervical cancer are no longer recommended for nonpregnant women who are considered low risk for cancer of the pelvic organs (ovaries, uterus, and vagina) and who do not have symptoms. A pelvic examination may be necessary if you have symptoms including those associated with pelvic infections. Ask your health care provider if a screening pelvic exam is right for you.   The Pap test is the screening test for cervical cancer for women who are considered at risk.  If you had a hysterectomy for a problem that was not cancer or a condition that could lead to cancer, then you no longer need Pap tests.  If you are older than 65 years, and you have had normal Pap tests for the past 10 years, you no longer need to have Pap tests.  If you have had past treatment for cervical cancer or a condition that could lead to cancer, you need Pap tests and screening for cancer for at least 20 years after your treatment.  If you no longer get a Pap test, assess your risk factors if they change (such as having a new sexual partner). This can affect whether you should start being screened again.  Some women have medical problems that increase their chance of getting cervical cancer. If this is the case for you, your health care provider may recommend more frequent screening and Pap tests.  The human papillomavirus (HPV) test is another test that may be used for cervical cancer screening. The HPV test looks for the virus that can cause cell changes in the cervix. The cells collected during the Pap test can be tested for HPV.  The HPV test can be used to screen women 30 years of age and older. Getting tested for HPV can extend the interval between normal Pap tests from three to five years.  An HPV  test also should be used to screen women of any age who have unclear Pap test results.  After 79 years of age, women should have HPV testing as often as Pap tests.  Colorectal Cancer  This type of cancer can be detected and often prevented.  Routine colorectal cancer screening usually begins at 79 years of age and continues through 79 years of age.  Your health care provider may recommend screening at an earlier age if you have risk factors for colon cancer.  Your health care provider may also recommend using home test kits to check for hidden blood in the stool.  A small camera at the end of a tube can be used to examine your colon directly (sigmoidoscopy or colonoscopy). This is done to check for the earliest forms of colorectal cancer.  Routine screening usually begins at age 50.  Direct examination of the colon should   be repeated every 5-10 years through 79 years of age. However, you may need to be screened more often if early forms of precancerous polyps or small growths are found. Skin Cancer  Check your skin from head to toe regularly.  Tell your health care provider about any new moles or changes in moles, especially if there is a change in a mole's shape or color.  Also tell your health care provider if you have a mole that is larger than the size of a pencil eraser.  Always use sunscreen. Apply sunscreen liberally and repeatedly throughout the day.  Protect yourself by wearing long sleeves, pants, a wide-brimmed hat, and sunglasses whenever you are outside. HEART DISEASE, DIABETES, AND HIGH BLOOD PRESSURE   Have your blood pressure checked at least every 1-2 years. High blood pressure causes heart disease and increases the risk of stroke.  If you are between 55 years and 79 years old, ask your health care provider if you should take aspirin to prevent strokes.  Have regular diabetes screenings. This involves taking a blood sample to check your fasting blood sugar  level.  If you are at a normal weight and have a low risk for diabetes, have this test once every three years after 79 years of age.  If you are overweight and have a high risk for diabetes, consider being tested at a younger age or more often. PREVENTING INFECTION  Hepatitis B  If you have a higher risk for hepatitis B, you should be screened for this virus. You are considered at high risk for hepatitis B if:  You were born in a country where hepatitis B is common. Ask your health care provider which countries are considered high risk.  Your parents were born in a high-risk country, and you have not been immunized against hepatitis B (hepatitis B vaccine).  You have HIV or AIDS.  You use needles to inject street drugs.  You live with someone who has hepatitis B.  You have had sex with someone who has hepatitis B.  You get hemodialysis treatment.  You take certain medicines for conditions, including cancer, organ transplantation, and autoimmune conditions. Hepatitis C  Blood testing is recommended for:  Everyone born from 1945 through 1965.  Anyone with known risk factors for hepatitis C. Sexually transmitted infections (STIs)  You should be screened for sexually transmitted infections (STIs) including gonorrhea and chlamydia if:  You are sexually active and are younger than 79 years of age.  You are older than 79 years of age and your health care provider tells you that you are at risk for this type of infection.  Your sexual activity has changed since you were last screened and you are at an increased risk for chlamydia or gonorrhea. Ask your health care provider if you are at risk.  If you do not have HIV, but are at risk, it may be recommended that you take a prescription medicine daily to prevent HIV infection. This is called pre-exposure prophylaxis (PrEP). You are considered at risk if:  You are sexually active and do not regularly use condoms or know the HIV status  of your partner(s).  You take drugs by injection.  You are sexually active with a partner who has HIV. Talk with your health care provider about whether you are at high risk of being infected with HIV. If you choose to begin PrEP, you should first be tested for HIV. You should then be tested every 3 months for as   long as you are taking PrEP.  PREGNANCY   If you are premenopausal and you may become pregnant, ask your health care provider about preconception counseling.  If you may become pregnant, take 400 to 800 micrograms (mcg) of folic acid every day.  If you want to prevent pregnancy, talk to your health care provider about birth control (contraception). OSTEOPOROSIS AND MENOPAUSE   Osteoporosis is a disease in which the bones lose minerals and strength with aging. This can result in serious bone fractures. Your risk for osteoporosis can be identified using a bone density scan.  If you are 5 years of age or older, or if you are at risk for osteoporosis and fractures, ask your health care provider if you should be screened.  Ask your health care provider whether you should take a calcium or vitamin D supplement to lower your risk for osteoporosis.  Menopause may have certain physical symptoms and risks.  Hormone replacement therapy may reduce some of these symptoms and risks. Talk to your health care provider about whether hormone replacement therapy is right for you.  HOME CARE INSTRUCTIONS   Schedule regular health, dental, and eye exams.  Stay current with your immunizations.   Do not use any tobacco products including cigarettes, chewing tobacco, or electronic cigarettes.  If you are pregnant, do not drink alcohol.  If you are breastfeeding, limit how much and how often you drink alcohol.  Limit alcohol intake to no more than 1 drink per day for nonpregnant women. One drink equals 12 ounces of beer, 5 ounces of wine, or 1 ounces of hard liquor.  Do not use street  drugs.  Do not share needles.  Ask your health care provider for help if you need support or information about quitting drugs.  Tell your health care provider if you often feel depressed.  Tell your health care provider if you have ever been abused or do not feel safe at home. Document Released: 07/02/2011 Document Revised: 05/03/2014 Document Reviewed: 11/18/2013 [facility] Patient Information 2015 [place], [place]. This information is not intended to replace advice given to you by your health care provider. Make sure you discuss any questions you have with your health care provider.

## 2015-03-16 NOTE — Progress Notes (Signed)
Pre visit review using our clinic review tool, if applicable. No additional management support is needed unless otherwise documented below in the visit note. 

## 2015-03-17 ENCOUNTER — Encounter: Payer: Self-pay | Admitting: Internal Medicine

## 2015-03-17 DIAGNOSIS — Z0000 Encounter for general adult medical examination without abnormal findings: Secondary | ICD-10-CM | POA: Insufficient documentation

## 2015-03-17 NOTE — Assessment & Plan Note (Signed)
Due for prevnar and shingles and does not want shingles, will think about prevnar for next visit. Given schedule of screening intervals. Has had DEXA in the past and need to review records to see if she is due for repeat. Up to date on colonoscopy (will have aged out when due for repeat in 2020). Tetanus up to date and repeat due in 2025.

## 2015-03-17 NOTE — Assessment & Plan Note (Signed)
Stable, no flare right now. Coming into allergy season she was using it more in January. Now 1-2 times per week. Not at night time.

## 2015-03-17 NOTE — Assessment & Plan Note (Signed)
Doing better since her cymbalta dosing was adjusted.

## 2015-03-17 NOTE — Progress Notes (Signed)
   Subjective:    Patient ID: Faith Edwards, female    DOB: 08-Jan-1932, 81 y.o.   MRN: [id number]  HPI Here for medicare wellness, no new changes to her health. The wound on her leg is fully healed at this time. Breathing done well. See A/P for status of chronic conditions. She is still having left ear pain and is in the care of Dr. Redmond Baseman of ENT.   Diet: heart healthy Physical activity: sedentary Depression/mood screen: negative Hearing: intact to whispered voice Visual acuity: grossly normal, performs annual eye exam  ADLs: capable Fall risk: none Home safety: good Cognitive evaluation: intact to orientation, naming, recall and repetition EOL planning: adv directives, full code/ I agree  I have personally reviewed and have noted 1. The patient's medical and social history - reviewed, no changes today 2. Their use of alcohol, tobacco or illicit drugs 3. Their current medications and supplements 4. The patient's functional ability including ADL's, fall risks, home safety risks and hearing or visual impairment. 5. Diet and physical activities 6. Evidence for depression or mood disorders 7. Care team reviewed and updated (available in snapshot view)  Review of Systems  Constitutional: Negative for fever, activity change and appetite change.  HENT: Positive for ear pain.   Eyes: Negative.   Respiratory: Negative for chest tightness, shortness of breath and wheezing.   Cardiovascular: Negative for chest pain, palpitations and leg swelling.  Gastrointestinal: Negative for abdominal pain, diarrhea and constipation.  Musculoskeletal: Positive for arthralgias and neck pain. Negative for gait problem.  Skin: Negative.   Neurological: Negative for dizziness, facial asymmetry, weakness, light-headedness and headaches.  Psychiatric/Behavioral: Negative for suicidal ideas and self-injury.      Objective:   Physical Exam  Constitutional: She is oriented to person, place, and time. She  appears well-developed and well-nourished. No distress.  HENT:  Head: Normocephalic and atraumatic.  Eyes: EOM are normal.  Neck: Normal range of motion.  Cardiovascular: Normal rate and regular rhythm.   Pulmonary/Chest: Effort normal and breath sounds normal. No respiratory distress. She has no wheezes. She has no rales.  Abdominal: Soft. Bowel sounds are normal. She exhibits no distension. There is no tenderness.  Neurological: She is alert and oriented to person, place, and time. Coordination normal.  Skin: Skin is warm and dry.   Filed Vitals:   03/16/15 0958  BP: 114/64  Pulse: 94  Temp: 98.2 F (36.8 C)  TempSrc: Oral  Resp: 16  Height: 5\' 7"  (1.702 m)  Weight: 173 lb (78.472 kg)  SpO2: 96%      Assessment & Plan:

## 2015-03-17 NOTE — Assessment & Plan Note (Signed)
Stable at this time and follows with Dr. Casimiro Needle.

## 2015-03-18 DIAGNOSIS — H9202 Otalgia, left ear: Secondary | ICD-10-CM | POA: Diagnosis not present

## 2015-03-18 DIAGNOSIS — M2662 Arthralgia of temporomandibular joint: Secondary | ICD-10-CM | POA: Diagnosis not present

## 2015-04-07 ENCOUNTER — Telehealth: Payer: Self-pay | Admitting: Internal Medicine

## 2015-04-07 NOTE — Telephone Encounter (Signed)
Pt called and would like to have her last  lab results mailed to her

## 2015-04-11 NOTE — Telephone Encounter (Signed)
Sent labs to patient.

## 2015-05-05 ENCOUNTER — Encounter: Payer: Self-pay | Admitting: Gastroenterology

## 2015-05-12 ENCOUNTER — Ambulatory Visit (INDEPENDENT_AMBULATORY_CARE_PROVIDER_SITE_OTHER): Payer: Medicare Other | Admitting: Internal Medicine

## 2015-05-12 ENCOUNTER — Encounter: Payer: Self-pay | Admitting: Internal Medicine

## 2015-05-12 VITALS — BP 112/60 | HR 101 | Temp 97.9°F | Ht 67.0 in | Wt 168.5 lb

## 2015-05-12 DIAGNOSIS — M545 Low back pain, unspecified: Secondary | ICD-10-CM

## 2015-05-12 DIAGNOSIS — F329 Major depressive disorder, single episode, unspecified: Secondary | ICD-10-CM

## 2015-05-12 DIAGNOSIS — K439 Ventral hernia without obstruction or gangrene: Secondary | ICD-10-CM | POA: Insufficient documentation

## 2015-05-12 DIAGNOSIS — F32A Depression, unspecified: Secondary | ICD-10-CM

## 2015-05-12 NOTE — Patient Instructions (Signed)
Please continue all other medications as before, and refills have been done if requested.  Please have the pharmacy call with any other refills you may need.  Please keep your appointments with your specialists as you may have planned  You will be contacted regarding the referral for: General Surgury 

## 2015-05-12 NOTE — Progress Notes (Signed)
Pre visit review using our clinic review tool, if applicable. No additional management support is needed unless otherwise documented below in the visit note. 

## 2015-05-12 NOTE — Progress Notes (Signed)
Subjective:    Patient ID: Faith Edwards, female    DOB: 08/27/1932, 81 y.o.   MRN: [id number]  HPI  Here to f/u,   C/o egg sized knot to stomach, ? Hernia, ruining her waistline and upsetting to her, seems to be causing some difficulty swallowing food as well.  Did not sleep at all last night also due to itch and funny feeling skin to the LBP, no bowel or bladder change, fever, wt loss,  worsening LE pain/numbness/weakness, gait change or falls. Minimal to no pain after got OOB this am. Denies worsening depressive symptoms, suicidal ideation, or panic; has ongoing anxiety Past Medical History  Diagnosis Date  . Pulmonary embolism 1993    on HRT  . Asthma   . Hx of blood clots 2007 & 2010    post TKR  . Chronic headaches     [facility]ache [facility]  . Skin cancer    Past Surgical History  Procedure Laterality Date  . 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 Cholecystectomy    . Colonoscopy      diverticulosis  . Esi   11/2013     C7-T1 ; Dr Nelva Bush  . Breast enhancement surgery  1985  . Sp facet injection  01/19/14    RC2-3,3-4,C4-5    reports that she has never smoked. She does not have any smokeless tobacco history on file. She reports that she drinks alcohol. She reports that she does not use illicit drugs. family history includes Allergies in her brother; Asthma in her brother; Clotting disorder in her father; Diabetes in her daughter and son; Heart attack in her brother; Heart disease in her brother; Mental illness in her son; Skin cancer in her father and mother; Sudden death in an other family member. There is no history of Stroke. Allergies  Allergen Reactions  . Penicillins Hives  . Sulfa Antibiotics Hives and Nausea And Vomiting    7-15   Current Outpatient Prescriptions on File Prior to Visit  Medication Sig Dispense Refill  . albuterol (PROVENTIL HFA;VENTOLIN HFA) 108 (90 BASE) MCG/ACT inhaler Inhale 2 puffs into the lungs every 6 (six) hours as needed for wheezing or  shortness of breath. 1 Inhaler 0  . aspirin EC 81 MG tablet Take 81 mg by mouth at bedtime.    . Aspirin-Acetaminophen-Caffeine (EXCEDRIN PO) Take 1 tablet by mouth every 6 (six) hours as needed (for migraines).     . calcium citrate-vitamin D (CITRACAL+D) 315-200 MG-UNIT per tablet Take 1 tablet by mouth 3 (three) times daily.     . Cyanocobalamin 1000 MCG/15ML LIQD Take 1,000 mcg by mouth every morning.    . DULoxetine (CYMBALTA) 60 MG capsule Take 90 mg by mouth every morning.     Marland Kitchen LORazepam (ATIVAN) 1 MG tablet Take 1 mg by mouth at bedtime.    . traMADol (ULTRAM) 50 MG tablet Take 50 mg by mouth daily as needed.     . [DISCONTINUED] Fluticasone-Salmeterol (ADVAIR) 100-50 MCG/DOSE AEPB Inhale 1 puff into the lungs 2 (two) times daily.     No current facility-administered medications on file prior to visit.   Review of Systems  Constitutional: Negative for unusual diaphoresis or night sweats HENT: Negative for ringing in ear or discharge Eyes: Negative for double vision or worsening visual disturbance.  Respiratory: Negative for choking and stridor.   Gastrointestinal: Negative for vomiting or other signifcant bowel change Genitourinary: Negative for hematuria or change in urine volume.  Musculoskeletal:  Negative for other MSK pain or swelling Skin: Negative for color change and worsening wound.  Neurological: Negative for tremors and numbness other than noted  Psychiatric/Behavioral: Negative for decreased concentration or agitation other than above       Objective:   Physical Exam BP 112/60 mmHg  Pulse 101  Temp(Src) 97.9 F (36.6 C) (Oral)  Ht 5\' 7"  (1.702 m)  Wt 168 lb 8 oz (76.431 kg)  BMI 26.38 kg/m2  SpO2 94% VS noted,  Constitutional: Pt appears in no significant distress HENT: Head: NCAT.  Right Ear: External ear normal.  Left Ear: External ear normal.  Eyes: . Pupils are equal, round, and reactive to light. Conjunctivae and EOM are normal Neck: Normal range of  motion. Neck supple.  Cardiovascular: Normal rate and regular rhythm.   Pulmonary/Chest: Effort normal and breath sounds without rales or wheezing.  Abd:  Soft, NT, ND, + BS except for egg sized mid ventral hernia mild tender, difficult to reduce Neurological: Pt is alert. Not confused , motor grossly intact Skin: Skin is warm. No rash, no LE edema Spine nontender , no lumbar paravertebral tender Psychiatric: Pt behavior is normal. No agitation. not depressed affect    Assessment & Plan:

## 2015-05-12 NOTE — Assessment & Plan Note (Signed)
stable overall by history and exam, recent data reviewed with pt, and pt to continue medical treatment as before,  to f/u any worsening symptoms or concerns Lab Results  Component Value Date   WBC 6.9 03/16/2015   HGB 13.0 03/16/2015   HCT 38.2 03/16/2015   PLT 330.0 03/16/2015   GLUCOSE 101* 03/16/2015   CHOL 233* 03/16/2015   TRIG 114.0 03/16/2015   HDL 66.50 03/16/2015   LDLCALC 144* 03/16/2015   ALT 16 03/16/2015   AST 21 03/16/2015   NA 137 03/16/2015   K 5.1 03/16/2015   CL 104 03/16/2015   CREATININE 1.01 03/16/2015   BUN 19 03/16/2015   CO2 29 03/16/2015   TSH 4.09 01/07/2014   INR 1.80* 02/06/2010

## 2015-05-12 NOTE — Assessment & Plan Note (Signed)
Non emergent, but having intermittent pain and tenderness, will refer to Gen Surg, advised to ER for any persistent or worsening pain for consideration emergent CT

## 2015-05-12 NOTE — Assessment & Plan Note (Signed)
Transient, mild, occurred last PM, no pain now, no rash noted, no neuro changes, for tylenol prn, f/u any worsening s/s

## 2015-05-31 DIAGNOSIS — F064 Anxiety disorder due to known physiological condition: Secondary | ICD-10-CM | POA: Diagnosis not present

## 2015-06-13 ENCOUNTER — Ambulatory Visit: Payer: Self-pay | Admitting: Surgery

## 2015-06-13 DIAGNOSIS — K430 Incisional hernia with obstruction, without gangrene: Secondary | ICD-10-CM | POA: Diagnosis not present

## 2015-06-27 ENCOUNTER — Other Ambulatory Visit: Payer: Self-pay

## 2015-07-06 ENCOUNTER — Encounter (HOSPITAL_COMMUNITY): Payer: Self-pay

## 2015-07-06 ENCOUNTER — Ambulatory Visit (HOSPITAL_COMMUNITY)
Admission: RE | Admit: 2015-07-06 | Discharge: 2015-07-06 | Disposition: A | Discharge status: Home / Self Care | Payer: Medicare Other | Source: Ambulatory Visit | Attending: Anesthesiology | Admitting: Anesthesiology

## 2015-07-06 ENCOUNTER — Encounter (HOSPITAL_COMMUNITY)
Admission: RE | Admit: 2015-07-06 | Discharge: 2015-07-06 | Disposition: A | Discharge status: Home / Self Care | Payer: Medicare Other | Source: Ambulatory Visit | Attending: Surgery | Admitting: Surgery

## 2015-07-06 DIAGNOSIS — Z86711 Personal history of pulmonary embolism: Secondary | ICD-10-CM | POA: Insufficient documentation

## 2015-07-06 DIAGNOSIS — J449 Chronic obstructive pulmonary disease, unspecified: Secondary | ICD-10-CM | POA: Insufficient documentation

## 2015-07-06 DIAGNOSIS — Z01818 Encounter for other preprocedural examination: Secondary | ICD-10-CM | POA: Diagnosis not present

## 2015-07-06 DIAGNOSIS — Z01810 Encounter for preprocedural cardiovascular examination: Secondary | ICD-10-CM

## 2015-07-06 HISTORY — DX: Personal history of other medical treatment: Z92.89

## 2015-07-06 HISTORY — DX: Otalgia, unspecified ear: H92.09

## 2015-07-06 HISTORY — DX: Depression, unspecified: F32.A

## 2015-07-06 HISTORY — DX: Bronchitis, not specified as acute or chronic: J40

## 2015-07-06 HISTORY — DX: Pneumonia, unspecified organism: J18.9

## 2015-07-06 HISTORY — DX: Reserved for inherently not codable concepts without codable children: IMO0001

## 2015-07-06 HISTORY — DX: Fracture of unspecified carpal bone, right wrist, initial encounter for closed fracture: S62.101A

## 2015-07-06 HISTORY — DX: Major depressive disorder, single episode, unspecified: F32.9

## 2015-07-06 HISTORY — DX: Injury, unspecified, initial encounter: T14.90XA

## 2015-07-06 HISTORY — DX: Fracture of unspecified parts of lumbosacral spine and pelvis, initial encounter for closed fracture: S32.9XXA

## 2015-07-06 HISTORY — DX: Frequency of micturition: R35.0

## 2015-07-06 HISTORY — DX: Anxiety disorder, unspecified: F41.9

## 2015-07-06 HISTORY — DX: Spinal stenosis, site unspecified: M48.00

## 2015-07-06 HISTORY — DX: Tremor, unspecified: R25.1

## 2015-07-06 HISTORY — DX: Cystitis, unspecified without hematuria: N30.90

## 2015-07-06 HISTORY — DX: Unspecified osteoarthritis, unspecified site: M19.90

## 2015-07-06 LAB — BASIC METABOLIC PANEL
ANION GAP: 7 (ref 5–15)
BUN: 23 mg/dL — ABNORMAL HIGH (ref 6–20)
CALCIUM: 9.3 mg/dL (ref 8.9–10.3)
CO2: 27 mmol/L (ref 22–32)
Chloride: 107 mmol/L (ref 101–111)
Creatinine, Ser: 0.9 mg/dL (ref 0.44–1.00)
GFR, EST NON AFRICAN AMERICAN: 58 mL/min — AB (ref >60)
Glucose, Bld: 96 mg/dL (ref 65–99)
Potassium: 4.3 mmol/L (ref 3.5–5.1)
SODIUM: 141 mmol/L (ref 135–145)

## 2015-07-06 LAB — CBC
HEMATOCRIT: 38.4 % (ref 36.0–46.0)
Hemoglobin: 12.1 g/dL (ref 12.0–15.0)
MCH: 29.7 pg (ref 26.0–34.0)
MCHC: 31.5 g/dL (ref 30.0–36.0)
MCV: 94.1 fL (ref 78.0–100.0)
Platelets: 310 10*3/uL (ref 150–400)
RBC: 4.08 MIL/uL (ref 3.87–5.11)
RDW: 12.4 % (ref 11.5–15.5)
WBC: 7.8 10*3/uL (ref 4.0–10.5)

## 2015-07-06 NOTE — Patient Instructions (Addendum)
Monigue Harkey Bugaj  07/06/2015   Your procedure is scheduled on: Friday July 08, 2015   Report to [facility] Main  Entrance take [place]  elevators to 3rd floor to  [place] at 7:15 AM.  Call this number if you have problems the morning of surgery [phone]   Remember: ONLY 1 PERSON MAY GO WITH YOU TO SHORT STAY TO GET  READY MORNING OF [place].  Do not eat food or drink liquids :After Midnight.     Take these medicines the morning of surgery with A SIP OF WATER: Duloxetine (Cymbalta); Albuterol Inhaler if needed                                You may not have any metal on your body including hair pins and              piercings  Do not wear jewelry, make-up, lotions, powders or perfumes, deodorant             Do not wear nail polish.  Do not shave  48 hours prior to surgery.                 Do not bring valuables to the hospital. [place].  Contacts, dentures or bridgework may not be worn into surgery.       Patients discharged the day of surgery will not be allowed to drive home.  Name and phone number of your driver:Carl Penrod (husband)  _____________________________________________________________________             [facility] - Preparing for Surgery Before surgery, you can play an important role.  Because skin is not sterile, your skin needs to be as free of germs as possible.  You can reduce the number of germs on your skin by washing with CHG (chlorahexidine gluconate) soap before surgery.  CHG is an antiseptic cleaner which kills germs and bonds with the skin to continue killing germs even after washing. Please DO NOT use if you have an allergy to CHG or antibacterial soaps.  If your skin becomes reddened/irritated stop using the CHG and inform your nurse when you arrive at Short Stay. Do not shave (including legs and underarms) for at least 48 hours prior to the first CHG shower.  You may  shave your face/neck. Please follow these instructions carefully:  1.  Shower with CHG Soap the night before surgery and the  morning of Surgery.  2.  If you choose to wash your hair, wash your hair first as usual with your  normal  shampoo.  3.  After you shampoo, rinse your hair and body thoroughly to remove the  shampoo.                           4.  Use CHG as you would any other liquid soap.  You can apply chg directly  to the skin and wash                       Gently with a scrungie or clean washcloth.  5.  Apply the CHG Soap to your body ONLY FROM THE NECK DOWN.  Do not use on face/ open                           Wound or open sores. Avoid contact with eyes, ears mouth and genitals (private parts).                       Wash face,  Genitals (private parts) with your normal soap.             6.  Wash thoroughly, paying special attention to the area where your surgery  will be performed.  7.  Thoroughly rinse your body with warm water from the neck down.  8.  DO NOT shower/wash with your normal soap after using and rinsing off  the CHG Soap.                9.  Pat yourself dry with a clean towel.            10.  Wear clean pajamas.            11.  Place clean sheets on your bed the night of your first shower and do not  sleep with pets. Day of Surgery : Do not apply any lotions/deodorants the morning of surgery.  Please wear clean clothes to the hospital/surgery center.  FAILURE TO FOLLOW THESE INSTRUCTIONS MAY RESULT IN THE CANCELLATION OF YOUR SURGERY PATIENT SIGNATURE_________________________________  NURSE SIGNATURE__________________________________  ________________________________________________________________________

## 2015-07-07 NOTE — Progress Notes (Signed)
BMP results in epic per PAT visit 07/06/2015 sent to Dr Harlow Asa

## 2015-07-08 ENCOUNTER — Ambulatory Visit (HOSPITAL_COMMUNITY): Payer: Medicare Other | Admitting: Certified Registered Nurse Anesthetist

## 2015-07-08 ENCOUNTER — Encounter (HOSPITAL_COMMUNITY)
Admission: RE | Disposition: A | Discharge status: Home / Self Care | Payer: Self-pay | Source: Ambulatory Visit | Attending: Surgery

## 2015-07-08 ENCOUNTER — Encounter (HOSPITAL_COMMUNITY): Payer: Self-pay | Admitting: *Deleted

## 2015-07-08 ENCOUNTER — Ambulatory Visit (HOSPITAL_COMMUNITY)
Admission: RE | Admit: 2015-07-08 | Discharge: 2015-07-08 | Disposition: A | Discharge status: Home / Self Care | Payer: Medicare Other | Source: Ambulatory Visit | Attending: Surgery | Admitting: Surgery

## 2015-07-08 DIAGNOSIS — Z9049 Acquired absence of other specified parts of digestive tract: Secondary | ICD-10-CM | POA: Insufficient documentation

## 2015-07-08 DIAGNOSIS — Z880 Allergy status to penicillin: Secondary | ICD-10-CM | POA: Insufficient documentation

## 2015-07-08 DIAGNOSIS — F418 Other specified anxiety disorders: Secondary | ICD-10-CM | POA: Diagnosis not present

## 2015-07-08 DIAGNOSIS — Z90710 Acquired absence of both cervix and uterus: Secondary | ICD-10-CM | POA: Insufficient documentation

## 2015-07-08 DIAGNOSIS — Z79899 Other long term (current) drug therapy: Secondary | ICD-10-CM | POA: Diagnosis not present

## 2015-07-08 DIAGNOSIS — R51 Headache: Secondary | ICD-10-CM | POA: Diagnosis not present

## 2015-07-08 DIAGNOSIS — M4802 Spinal stenosis, cervical region: Secondary | ICD-10-CM | POA: Diagnosis not present

## 2015-07-08 DIAGNOSIS — Z882 Allergy status to sulfonamides status: Secondary | ICD-10-CM | POA: Diagnosis not present

## 2015-07-08 DIAGNOSIS — K432 Incisional hernia without obstruction or gangrene: Secondary | ICD-10-CM | POA: Diagnosis present

## 2015-07-08 DIAGNOSIS — K430 Incisional hernia with obstruction, without gangrene: Secondary | ICD-10-CM | POA: Insufficient documentation

## 2015-07-08 HISTORY — PX: INSERTION OF MESH: SHX5868

## 2015-07-08 HISTORY — PX: VENTRAL HERNIA REPAIR: SHX424

## 2015-07-08 SURGERY — REPAIR, HERNIA, VENTRAL
Anesthesia: General | Site: Abdomen

## 2015-07-08 MED ORDER — GLYCOPYRROLATE 0.2 MG/ML IJ SOLN
INTRAMUSCULAR | Status: DC | PRN
  Administered 2015-07-08: 0.6 mg via INTRAVENOUS

## 2015-07-08 MED ORDER — CIPROFLOXACIN IN D5W 400 MG/200ML IV SOLN
INTRAVENOUS | Status: AC
  Filled 2015-07-08: qty 200

## 2015-07-08 MED ORDER — BUPIVACAINE-EPINEPHRINE 0.25% -1:200000 IJ SOLN
INTRAMUSCULAR | Status: AC
  Filled 2015-07-08: qty 1

## 2015-07-08 MED ORDER — HYDROCODONE-ACETAMINOPHEN 5-325 MG PO TABS: 1.0000 | ORAL_TABLET | ORAL | Status: DC | PRN

## 2015-07-08 MED ORDER — FENTANYL CITRATE (PF) 250 MCG/5ML IJ SOLN
INTRAMUSCULAR | Status: AC
  Filled 2015-07-08: qty 5

## 2015-07-08 MED ORDER — 0.9 % SODIUM CHLORIDE (POUR BTL) OPTIME
TOPICAL | Status: DC | PRN
  Administered 2015-07-08: 500 mL

## 2015-07-08 MED ORDER — NEOSTIGMINE METHYLSULFATE 10 MG/10ML IV SOLN
INTRAVENOUS | Status: DC | PRN
  Administered 2015-07-08: 5 mg via INTRAVENOUS

## 2015-07-08 MED ORDER — EPHEDRINE SULFATE 50 MG/ML IJ SOLN
INTRAMUSCULAR | Status: AC
  Filled 2015-07-08: qty 1

## 2015-07-08 MED ORDER — NEOSTIGMINE METHYLSULFATE 10 MG/10ML IV SOLN
INTRAVENOUS | Status: AC
  Filled 2015-07-08: qty 1

## 2015-07-08 MED ORDER — PROPOFOL 10 MG/ML IV BOLUS
INTRAVENOUS | Status: AC
  Filled 2015-07-08: qty 20

## 2015-07-08 MED ORDER — ONDANSETRON HCL 4 MG/2ML IJ SOLN
INTRAMUSCULAR | Status: AC
  Filled 2015-07-08: qty 2

## 2015-07-08 MED ORDER — SODIUM CHLORIDE 0.9 % IJ SOLN
INTRAMUSCULAR | Status: AC
  Filled 2015-07-08: qty 10

## 2015-07-08 MED ORDER — HYDROCODONE-ACETAMINOPHEN 5-325 MG PO TABS
1.0000 | ORAL_TABLET | ORAL | Status: DC | PRN
  Administered 2015-07-08: 1 via ORAL
  Filled 2015-07-08: qty 1

## 2015-07-08 MED ORDER — SUCCINYLCHOLINE CHLORIDE 20 MG/ML IJ SOLN
INTRAMUSCULAR | Status: DC | PRN
  Administered 2015-07-08: 100 mg via INTRAVENOUS

## 2015-07-08 MED ORDER — CIPROFLOXACIN IN D5W 400 MG/200ML IV SOLN
400.0000 mg | INTRAVENOUS | Status: AC
  Administered 2015-07-08: 400 mg via INTRAVENOUS

## 2015-07-08 MED ORDER — LIDOCAINE HCL (CARDIAC) 20 MG/ML IV SOLN
INTRAVENOUS | Status: AC
  Filled 2015-07-08: qty 5

## 2015-07-08 MED ORDER — GLYCOPYRROLATE 0.2 MG/ML IJ SOLN
INTRAMUSCULAR | Status: AC
  Filled 2015-07-08: qty 3

## 2015-07-08 MED ORDER — FENTANYL CITRATE (PF) 100 MCG/2ML IJ SOLN
INTRAMUSCULAR | Status: DC | PRN
  Administered 2015-07-08 (×3): 50 ug via INTRAVENOUS

## 2015-07-08 MED ORDER — PROPOFOL 10 MG/ML IV BOLUS
INTRAVENOUS | Status: DC | PRN
  Administered 2015-07-08: 120 mg via INTRAVENOUS

## 2015-07-08 MED ORDER — ONDANSETRON HCL 4 MG/2ML IJ SOLN
INTRAMUSCULAR | Status: DC | PRN
  Administered 2015-07-08: 4 mg via INTRAVENOUS

## 2015-07-08 MED ORDER — DEXAMETHASONE SODIUM PHOSPHATE 10 MG/ML IJ SOLN
INTRAMUSCULAR | Status: AC
Start: 2015-07-08 — End: 2015-07-08
  Filled 2015-07-08: qty 1

## 2015-07-08 MED ORDER — BUPIVACAINE-EPINEPHRINE 0.25% -1:200000 IJ SOLN
INTRAMUSCULAR | Status: DC | PRN
  Administered 2015-07-08: 20 mL

## 2015-07-08 MED ORDER — FENTANYL CITRATE (PF) 100 MCG/2ML IJ SOLN: 25.0000 ug | INTRAMUSCULAR | Status: DC | PRN

## 2015-07-08 MED ORDER — ROCURONIUM BROMIDE 100 MG/10ML IV SOLN
INTRAVENOUS | Status: DC | PRN
  Administered 2015-07-08: 25 mg via INTRAVENOUS

## 2015-07-08 MED ORDER — LACTATED RINGERS IV SOLN
INTRAVENOUS | Status: DC
  Administered 2015-07-08: 1000 mL via INTRAVENOUS

## 2015-07-08 MED ORDER — ROCURONIUM BROMIDE 100 MG/10ML IV SOLN
INTRAVENOUS | Status: AC
  Filled 2015-07-08: qty 1

## 2015-07-08 SURGICAL SUPPLY — 30 items
APL SKNCLS STERI-STRIP NONHPOA (GAUZE/BANDAGES/DRESSINGS) ×2
BENZOIN TINCTURE PRP APPL 2/3 (GAUZE/BANDAGES/DRESSINGS) ×1 IMPLANT
BINDER ABDOMINAL 12 ML 46-62 (SOFTGOODS) IMPLANT
BLADE HEX COATED 2.75 (ELECTRODE) ×3 IMPLANT
DRAPE LAPAROSCOPIC ABDOMINAL (DRAPES) ×3 IMPLANT
ELECT REM PT RETURN 9FT ADLT (ELECTROSURGICAL) ×3
ELECTRODE REM PT RTRN 9FT ADLT (ELECTROSURGICAL) ×2 IMPLANT
GAUZE SPONGE 4X4 12PLY STRL (GAUZE/BANDAGES/DRESSINGS) ×3 IMPLANT
GLOVE BIOGEL PI IND STRL 7.0 (GLOVE) ×2 IMPLANT
GLOVE BIOGEL PI IND STRL 7.5 (GLOVE) IMPLANT
GLOVE BIOGEL PI INDICATOR 7.0 (GLOVE) ×1
GLOVE BIOGEL PI INDICATOR 7.5 (GLOVE) ×2
GLOVE SURG ORTHO 8.0 STRL STRW (GLOVE) ×3 IMPLANT
GOWN STRL REUS W/TWL LRG LVL3 (GOWN DISPOSABLE) ×2 IMPLANT
GOWN STRL REUS W/TWL XL LVL3 (GOWN DISPOSABLE) ×6 IMPLANT
KIT BASIN OR (CUSTOM PROCEDURE TRAY) ×3 IMPLANT
MESH VENTRALEX ST 1-7/10 CRC S (Mesh General) ×1 IMPLANT
NS IRRIG 1000ML POUR BTL (IV SOLUTION) ×3 IMPLANT
PACK GENERAL/GYN (CUSTOM PROCEDURE TRAY) ×3 IMPLANT
STAPLER VISISTAT 35W (STAPLE) ×3 IMPLANT
STRIP CLOSURE SKIN 1/2X4 (GAUZE/BANDAGES/DRESSINGS) ×1 IMPLANT
SUT MNCRL AB 4-0 PS2 18 (SUTURE) ×1 IMPLANT
SUT NOVA 1 T20/GS 25DT (SUTURE) IMPLANT
SUT NOVA NAB GS-21 0 18 T12 DT (SUTURE) ×1 IMPLANT
SUT PDS AB 1 CTX 36 (SUTURE) IMPLANT
SUT VIC AB 2-0 CT2 27 (SUTURE) IMPLANT
SUT VIC AB 3-0 SH 27 (SUTURE) ×3
SUT VIC AB 3-0 SH 27X BRD (SUTURE) IMPLANT
TOWEL OR 17X26 10 PK STRL BLUE (TOWEL DISPOSABLE) ×3 IMPLANT
TRAY FOLEY W/METER SILVER 14FR (SET/KITS/TRAYS/PACK) IMPLANT

## 2015-07-08 NOTE — H&P (Signed)
General Surgery [facility]  Jaylon Stapel DOB: 1932-04-17 Married / Language: English / Race: White Female  History of Present Illness Patient words: hernia.  The patient is a 81 year old female who presents with an incisional hernia. Patient is referred by Dr. Cathlean Cower with incisional hernia in the upper abdominal wall. Patient had undergone an open cholecystectomy in 1992. Over the past 3-4 months she has noted a bulge at the medial aspect of her right subcostal incision. This has slowly become larger in size. It causes her discomfort. She has noted some mild changes in her bowel habits. Her only other abdominal procedure was a vaginal hysterectomy. Patient has had no prior hernia repairs. She presents today to discuss surgical repair.   Other Problems  Anxiety Disorder  Past Surgical History Anal Fissure Repair Cataract Surgery Left. Gallbladder Surgery - Open Hysterectomy (not due to cancer) - Partial Tonsillectomy  Diagnostic Studies History Colonoscopy 1-5 years ago  Allergies  Penicillins Sulfa Antibiotics Hives, Nausea, Vomiting.  Medication History Albuterol Sulfate (108 (90 Base)MCG/ACT Aero Pow Br Act, Inhalation) Active. Aspirin EC (81MG  Tablet DR, Oral) Active. Citracal + D (315-200MG -UNIT Tablet, Oral) Active. Vitamin B12 (3000MCG/ML Liquid, Sublingual) Active. (170mcg/15ml) Cymbalta (60MG  Capsule DR Part, Oral) Active. Cymbalta (30MG  Capsule DR Part, Oral) Active. (90 mg total) Ultram (50MG  Tablet, Oral) Active. Medications Reconciled  Social History Alcohol use Moderate alcohol use. Caffeine use Carbonated beverages, Coffee, Tea. No drug use Tobacco use Never smoker.  Family History  Arthritis Daughter, Mother. Diabetes Mellitus Daughter, Son. Hypertension Brother, Mother. Migraine Headache Daughter. Respiratory Condition Father. Thyroid problems Mother.  Pregnancy / Birth History  Age at  menarche 89 years. Age of menopause 83-50 Gravida 3  Review of Systems General Present- Fatigue and Weight Gain. Not Present- Appetite Loss, Chills, Fever, Night Sweats and Weight Loss. Skin Present- Dryness. Not Present- Change in Wart/Mole, Hives, Jaundice, New Lesions, Non-Healing Wounds, Rash and Ulcer. HEENT Present- Earache, Hearing Loss, Ringing in the Ears, Seasonal Allergies and Wears glasses/contact lenses. Not Present- Hoarseness, Nose Bleed, Oral Ulcers, Sinus Pain, Sore Throat, Visual Disturbances and Yellow Eyes. Respiratory Present- Chronic Cough. Not Present- Bloody sputum, Difficulty Breathing, Snoring and Wheezing. Breast Not Present- Breast Mass, Breast Pain, Nipple Discharge and Skin Changes. Cardiovascular Present- Shortness of Breath. Not Present- Chest Pain, Difficulty Breathing Lying Down, Leg Cramps, Palpitations, Rapid Heart Rate and Swelling of Extremities. Gastrointestinal Present- Bloating, Constipation and Excessive gas. Not Present- Abdominal Pain, Bloody Stool, Change in Bowel Habits, Chronic diarrhea, Difficulty Swallowing, Gets full quickly at meals, Hemorrhoids, Indigestion, Nausea, Rectal Pain and Vomiting. Female Genitourinary Present- Nocturia. Not Present- Frequency, Painful Urination, Pelvic Pain and Urgency.   Vitals Weight: 167.2 lb Height: 67in Body Surface Area: 1.89 m Body Mass Index: 26.19 kg/m Temp.: 98.69F(Oral)  Pulse: 66 (Regular)  BP: 120/64 (Sitting, Left Arm, Standard)    Physical Exam  General - appears comfortable, no distress; not diaphorectic  HEENT - normocephalic; sclerae clear, gaze conjugate; mucous membranes moist, dentition good; voice normal  Neck - symmetric on extension; no palpable anterior or posterior cervical adenopathy; no palpable masses in the thyroid bed  Chest - clear bilaterally without rhonchi, rales, or wheeze  Cor - regular rhythm with normal rate; no significant murmur  Abd - soft  without distension; well-healed incision right subcostal; at the medial extent of the right subcostal incision is a palpable 3 cm subcutaneous bulge which is relatively firm and only partially reducible; it augments with Valsalva. There  is a tiny umbilical hernia which does not change with Valsalva.  Ext - non-tender without significant edema or lymphedema  Neuro - grossly intact; mild tremor    Assessment & Plan  INCISIONAL HERNIA, INCARCERATED (552.21  K43.0)  Patient presents to discuss repair of ventral incisional hernia. She is provided with written literature to review at home regarding hernia surgery.  Patient has an incarcerated ventral incisional hernia at the medial aspect of her right subcostal incision. This is relatively small. It has recently increased in size. It is causing her some discomfort. I have recommended repair with mesh patch. We have discussed the risk and benefits of the procedure including the risk of recurrence. I believe this could be performed as an outpatient surgical procedure. We discussed restrictions on her activities after the surgery. She understands and wishes to proceed in the near future.  The risks and benefits of the procedure have been discussed at length with the patient. The patient understands the proposed procedure, potential alternative treatments, and the course of recovery to be expected. All of the patient's questions have been answered at this time. The patient wishes to proceed with surgery.  Earnstine Regal, MD, [facility] Office: [phone]

## 2015-07-08 NOTE — Anesthesia Postprocedure Evaluation (Signed)
  Anesthesia Post-op Note  Patient: Faith Edwards  Procedure(s) Performed: Procedure(s) (LRB): VENTRAL ADULT REPAIR VENTRAL INCISIONAL HERNIA REPAIR (N/A) INSERTION OF MESH (N/A)  Patient Location: PACU  Anesthesia Type: General  Level of Consciousness: awake and alert   Airway and Oxygen Therapy: Patient Spontanous Breathing  Post-op Pain: mild  Post-op Assessment: Post-op Vital signs reviewed, Patient's Cardiovascular Status Stable, Respiratory Function Stable, Patent Airway and No signs of Nausea or vomiting  Last Vitals:  Filed Vitals:   07/08/15 1205  BP: 132/62  Pulse: 86  Temp:   Resp: 18    Post-op Vital Signs: stable   Complications: No apparent anesthesia complications

## 2015-07-08 NOTE — Discharge Instructions (Signed)
PATIENT INSTRUCTIONS  POST-ANESTHESIA    IMMEDIATELY FOLLOWING SURGERY:  Do not drive or operate machinery for the first twenty four hours after surgery.  Do not make any important decisions for twenty four hours after surgery or while taking narcotic pain medications or sedatives.  If you develop intractable nausea and vomiting or a severe headache please notify your doctor immediately.    FOLLOW-UP:  Please make an appointment with your surgeon as instructed. You do not need to follow up with anesthesia unless specifically instructed to do so.      QUESTIONS?:  Please feel free to call your physician or the hospital operator if you have any questions, and they will be happy to assist you.

## 2015-07-08 NOTE — Anesthesia Procedure Notes (Signed)
Procedure Name: Intubation Date/Time: 07/08/2015 8:57 AM Performed by: Maxwell Caul Pre-anesthesia Checklist: Patient identified, Emergency Drugs available, Suction available and Patient being monitored Patient Re-evaluated:Patient Re-evaluated prior to inductionOxygen Delivery Method: Circle System Utilized Preoxygenation: Pre-oxygenation with 100% oxygen Intubation Type: IV induction Ventilation: Mask ventilation without difficulty Laryngoscope Size: Mac and 4 Grade View: Grade I Tube type: Oral Tube size: 7.0 mm Number of attempts: 1 Airway Equipment and Method: Stylet and Oral airway Placement Confirmation: ETT inserted through vocal cords under direct vision,  positive ETCO2 and breath sounds checked- equal and bilateral Secured at: 21 cm Tube secured with: Tape Dental Injury: Teeth and Oropharynx as per pre-operative assessment

## 2015-07-08 NOTE — Brief Op Note (Signed)
07/08/2015  9:47 AM  PATIENT:  Faith Edwards  81 y.o. female  PRE-OPERATIVE DIAGNOSIS:  Incarcerated ventral incisional hernia  POST-OPERATIVE DIAGNOSIS:  same  PROCEDURE:  Procedure(s): VENTRAL ADULT REPAIR VENTRAL INCISIONAL HERNIA REPAIR (N/A) INSERTION OF MESH (N/A)  SURGEON:  Surgeon(s) and Role:    * Armandina Gemma, MD - Primary  ANESTHESIA:   general  EBL:  Total I/O In: 100 [I.V.:100] Out: -   BLOOD ADMINISTERED:none  DRAINS: none   LOCAL MEDICATIONS USED:  MARCAINE     SPECIMEN:  No Specimen  DISPOSITION OF SPECIMEN:  N/A  COUNTS:  YES  TOURNIQUET:  * No tourniquets in log *  DICTATION: .Other Dictation: Dictation Number [id number]  PLAN OF CARE: Discharge to home after PACU  PATIENT DISPOSITION:  PACU - hemodynamically stable.   Delay start of Pharmacological VTE agent (>24hrs) due to surgical blood loss or risk of bleeding: yes  Earnstine Regal, MD, [facility] Office: [phone]

## 2015-07-08 NOTE — Transfer of Care (Signed)
Immediate Anesthesia Transfer of Care Note  Patient: Faith Edwards  Procedure(s) Performed: Procedure(s): VENTRAL ADULT REPAIR VENTRAL INCISIONAL HERNIA REPAIR (N/A) INSERTION OF MESH (N/A)  Patient Location: PACU  Anesthesia Type:General  Level of Consciousness:  sedated, patient cooperative and responds to stimulation  Airway & Oxygen Therapy:Patient Spontanous Breathing and Patient connected to face mask oxgen  Post-op Assessment:  Report given to PACU RN and Post -op Vital signs reviewed and stable  Post vital signs:  Reviewed and stable  Last Vitals:  Filed Vitals:   07/08/15 0714  BP: 138/69  Pulse: 91  Temp: 36.8 C  Resp: 16    Complications: No apparent anesthesia complications

## 2015-07-08 NOTE — Anesthesia Preprocedure Evaluation (Addendum)
Anesthesia Evaluation  Patient identified by MRN, date of birth, ID band Patient awake    Reviewed: Allergy & Precautions, H&P , NPO status , Patient's Chart, lab work & pertinent test results  Airway Mallampati: II  TM Distance: >3 FB Neck ROM: full    Dental no notable dental hx. (+) Dental Advisory Given, Teeth Intact,    Pulmonary shortness of breath and with exertion, asthma , pneumonia -, resolved, PE CXR: COPD breath sounds clear to auscultation  Pulmonary exam normal       Cardiovascular Exercise Tolerance: Good negative cardio ROS Normal cardiovascular examRhythm:regular Rate:Normal     Neuro/Psych  Headaches, PSYCHIATRIC DISORDERS Anxiety Depression Spinal stenosis cervical    GI/Hepatic negative GI ROS, Neg liver ROS,   Endo/Other  negative endocrine ROS  Renal/GU negative Renal ROS  negative genitourinary   Musculoskeletal  (+) Arthritis -,   Abdominal   Peds negative pediatric ROS (+)  Hematology negative hematology ROS (+)   Anesthesia Other Findings   Reproductive/Obstetrics negative OB ROS                           Anesthesia Physical Anesthesia Plan  ASA: III  Anesthesia Plan: General   Post-op Pain Management:    Induction: Intravenous  Airway Management Planned: Oral ETT  Additional Equipment:   Intra-op Plan:   Post-operative Plan: Extubation in OR  Informed Consent: I have reviewed the patients History and Physical, chart, labs and discussed the procedure including the risks, benefits and alternatives for the proposed anesthesia with the patient or authorized representative who has indicated his/her understanding and acceptance.   Dental Advisory Given  Plan Discussed with: CRNA and Surgeon  Anesthesia Plan Comments:         Anesthesia Quick Evaluation

## 2015-07-09 NOTE — Op Note (Signed)
NAMELEILANNI, Faith Edwards                 ACCOUNT NO.:  [id number]  MEDICAL RECORD NO.:  [id number]  LOCATION:  [facility]                         FACILITY:  [facility]  PHYSICIAN:  Earnstine Regal, MD      DATE OF BIRTH:  04/06/1932  DATE OF PROCEDURE:  07/08/2015                              OPERATIVE REPORT   PREOPERATIVE DIAGNOSIS:  Incarcerated ventral incisional hernia.  POSTOPERATIVE DIAGNOSIS:  Incarcerated ventral incisional hernia.  PROCEDURE:  Repair of incarcerated ventral incisional hernia with mesh patch.  SURGEON:  Earnstine Regal, MD, FACS  ANESTHESIA:  General.  ESTIMATED BLOOD LOSS:  Minimal.  PREPARATION:  ChloraPrep.  COMPLICATIONS:  None.  INDICATIONS:  The patient is an 81 year old female referred by her primary care physician, Dr. Cathlean Cower, with an incarcerated incisional hernia in the upper abdominal wall.  The patient had undergone an open cholecystectomy in 1992.  Over the past several months, she had developed a bulge at the medial aspect of the right subcostal incision. This was slowly become larger in size and was no longer reducible.  She now comes to Surgery for repair.  BODY OF REPORT:  Procedure was done in OR #2 at the [facility].  The patient was brought to the operating room, placed in supine position on the operating room table.  Following administration of general anesthesia, the patient was prepped and draped in usual aseptic fashion.  After ascertaining that an adequate level of anesthesia had been achieved, the medial portion of her right subcostal incision was reopened with a #15 blade and extended slightly medially. Dissection was carried into subcutaneous tissues where a hernia sac was identified.  Hernia sac was dissected out circumferentially down to the level of the fascia.  Fascial defect measures approximately 1 cm in diameter.  The hernia sac contains incarcerated omentum.  This was excised using the electrocautery for  hemostasis.  Hernia sac and incarcerated omentum is discarded.  The fascial plane is developed circumferentially.  The opening was enlarged slightly.  A Bard Ventralex ST 4.3 cm mesh patch was selected. It was prepared and inserted into the preperitoneal space and deployed circumferentially.  It was secured to the fascia circumferentially with interrupted 0 Novafil simple sutures.  There was good approximation to the anterior abdominal wall and brought coverage with overlap of the fascial defect.  Local anesthetic was infiltrated into the fascia. Subcutaneous tissues were closed with interrupted 3-0 Vicryl sutures. Skin was anesthetized with local anesthetic.  Skin was closed with a running 4-0 Monocryl subcuticular suture.  Wound was washed and dried and benzoin and Steri-Strips were applied.  Sterile dressings were applied. The patient was awakened from anesthesia and brought to the recovery room.  The patient tolerated the procedure well.   Earnstine Regal, MD, [facility] Office: [phone]    TMG/MEDQ  D:  07/08/2015  T:  07/09/2015  Job:  [id number]  cc:   Biagio Borg, MD

## 2015-07-11 ENCOUNTER — Encounter (HOSPITAL_COMMUNITY): Payer: Self-pay | Admitting: Surgery

## 2015-08-04 DIAGNOSIS — D1800 Hemangioma unspecified site: Secondary | ICD-10-CM | POA: Diagnosis not present

## 2015-08-04 DIAGNOSIS — L821 Other seborrheic keratosis: Secondary | ICD-10-CM | POA: Diagnosis not present

## 2015-08-04 DIAGNOSIS — D225 Melanocytic nevi of trunk: Secondary | ICD-10-CM | POA: Diagnosis not present

## 2015-08-04 DIAGNOSIS — Z85828 Personal history of other malignant neoplasm of skin: Secondary | ICD-10-CM | POA: Diagnosis not present

## 2015-08-30 ENCOUNTER — Telehealth: Payer: Self-pay | Admitting: Internal Medicine

## 2015-08-30 ENCOUNTER — Telehealth: Payer: Self-pay | Admitting: Gastroenterology

## 2015-08-30 NOTE — Telephone Encounter (Signed)
error 

## 2015-08-30 NOTE — Telephone Encounter (Signed)
Patient will come in and be seen on Thursday 09/01/15 1:30 with Alonza Bogus, PA

## 2015-09-01 ENCOUNTER — Encounter: Payer: Self-pay | Admitting: Gastroenterology

## 2015-09-01 ENCOUNTER — Ambulatory Visit (INDEPENDENT_AMBULATORY_CARE_PROVIDER_SITE_OTHER): Payer: Medicare Other | Admitting: Gastroenterology

## 2015-09-01 VITALS — BP 110/60 | HR 70 | Ht 66.0 in | Wt 160.0 lb

## 2015-09-01 DIAGNOSIS — K5909 Other constipation: Secondary | ICD-10-CM | POA: Diagnosis not present

## 2015-09-01 DIAGNOSIS — K625 Hemorrhage of anus and rectum: Secondary | ICD-10-CM | POA: Diagnosis not present

## 2015-09-01 MED ORDER — NA SULFATE-K SULFATE-MG SULF 17.5-3.13-1.6 GM/177ML PO SOLN: 1.0000 | Freq: Once | ORAL | Status: DC

## 2015-09-01 NOTE — Patient Instructions (Signed)

## 2015-09-01 NOTE — Progress Notes (Signed)
09/01/2015 Faith Edwards [id number] Oct 19, 1932   HISTORY OF PRESENT ILLNESS:  This is an 81 year old female who is known to Dr. Fuller Plan for previous colonoscopy, last in September 2010 at which time she was only found have diverticulosis in the sigmoid colon. She has past medical history of DVTs and pulmonary embolisms related to orthopedic surgeries and hormone replacement therapy.  Has depression secondary to the deaths of her two sons with several months of each other.  Otherwise is very healthy for her age.  She presents to our office today with complaints of constipation and rectal bleeding. The constipation has been overall an ongoing issue for her.  She was concerned, however, because she had several days of rectal bleeding recently. It has now resolved, but occurred for several days in a row. She thinks it is likely related to her hemorrhoids and her constipation/straining, but she was scared that she has some type of colon or rectal cancer. She is asking about colonoscopy.   Past Medical History  Diagnosis Date  . Pulmonary embolism 1993    on HRT  . Asthma   . Hx of blood clots 2007 & 2010    post TKR  . Chronic headaches     [facility]  . Trauma     left lower leg 10/22/2014   . Pneumonia     hx of 1952  . Bronchitis     hx of   . Shortness of breath dyspnea   . Depression   . Anxiety   . Cystitis     1954  . Urinary frequency   . Arthritis   . Skin cancer     basal cell   . History of blood transfusion     2010  . Spinal stenosis   . Shaking     hands bilat  . Ear pain     bilat   . Pelvic fracture     hx of in three places secondary to fall   . Right wrist fracture     hx of 1995 has plate secondary to fall    Past Surgical History  Procedure Laterality Date  . 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 Cholecystectomy    . Colonoscopy      diverticulosis  . Esi   11/2013     C7-T1 ; Dr Nelva Bush  . Breast enhancement surgery  1985  . Sp  facet injection  01/19/14    RC2-3,3-4,C4-5  . Abdominal hysterectomy    . Tonsillectomy    . Eye surgery      bilat cataract surgery   . Ventral hernia repair N/A 07/08/2015    Procedure: VENTRAL ADULT REPAIR VENTRAL INCISIONAL HERNIA REPAIR;  Surgeon: Armandina Gemma, MD;  Location: [facility];  Service: General;  Laterality: N/A;  . Insertion of mesh N/A 07/08/2015    Procedure: INSERTION OF MESH;  Surgeon: Armandina Gemma, MD;  Location: [facility];  Service: General;  Laterality: N/A;    reports that she has never smoked. She has never used smokeless tobacco. She reports that she drinks alcohol. She reports that she does not use illicit drugs. family history includes Allergies in her brother; Asthma in her brother; Clotting disorder in her father; Diabetes in her daughter and son; Heart attack in her brother; Heart disease in her brother; Mental illness in her son; Skin cancer in her father and mother; Sudden death in an other family member. There is no history of  Stroke. Allergies  Allergen Reactions  . Penicillins Hives  . Sulfa Antibiotics Hives and Nausea And Vomiting    7-15      Outpatient Encounter Prescriptions as of 09/01/2015  Medication Sig  . albuterol (PROVENTIL HFA;VENTOLIN HFA) 108 (90 BASE) MCG/ACT inhaler Inhale 2 puffs into the lungs every 6 (six) hours as needed for wheezing or shortness of breath.  . Aspirin-Acetaminophen-Caffeine (EXCEDRIN PO) Take 1 tablet by mouth every 6 (six) hours as needed (for migraines).   . cholecalciferol (VITAMIN D) 1000 UNITS tablet Take 1,000 Units by mouth daily.  . Cyanocobalamin 1000 MCG/15ML LIQD Take 1,000 mcg by mouth every morning.  . DULoxetine (CYMBALTA) 30 MG capsule Take 90 mg by mouth daily.  . DULoxetine (CYMBALTA) 60 MG capsule Take 60 mg by mouth daily.  Marland Kitchen HYDROcodone-acetaminophen (NORCO/VICODIN) 5-325 MG per tablet Take 1-2 tablets by mouth every 4 (four) hours as needed for moderate pain.  Marland Kitchen LORazepam (ATIVAN) 1 MG tablet Take 0.5-1 mg  by mouth 2 (two) times daily as needed for anxiety or sleep.   . Na Sulfate-K Sulfate-Mg Sulf (SUPREP BOWEL PREP) SOLN Take 1 kit by mouth once.   No facility-administered encounter medications on file as of 09/01/2015.     REVIEW OF SYSTEMS  : All other systems reviewed and negative except where noted in the History of Present Illness.   PHYSICAL EXAM: BP 110/60 mmHg  Pulse 70  Ht _0  (1.676 m)  Wt 160 lb (72.576 kg)  BMI 25.84 kg/m2 General: Well developed white female in no acute distress Head: Normocephalic and atraumatic Eyes:  Sclerae anicteric, conjunctiva pink. Ears: Normal auditory acuity Lungs: Clear throughout to auscultation Heart: Regular rate and rhythm Abdomen: Soft, non-distended.  Recent ventral hernia incision noted; healing well. Normal bowel sounds.  Non-tender. Rectal:  Small external hemorrhoids noted.  No masses or pain felt on DRE.  Heme negative trace stool on exam glove. Musculoskeletal: Symmetrical with no gross deformities  Skin: No lesions on visible extremities Extremities: No edema  Neurological: Alert oriented x 4, grossly non-focal Psychological:  Alert and cooperative. Normal mood and affect  ASSESSMENT AND PLAN: -Rectal bleeding:  Likely hemorrhoidal related to constipation, but last colonoscopy 6 years ago.  I do not think that it is inappropriate to repeat this study while she is still in good health at her advanced age. We'll plan for colonoscopy with Dr. Fuller Plan.  The risks, benefits, and alternatives to colonoscopy were discussed with the patient and she consents to proceed.  -Constipation:  Discussed starting Miralax daily.  CC:  Olga Millers, MD

## 2015-09-01 NOTE — Progress Notes (Signed)
Reviewed and agree with management plan.  Jahmiya Guidotti T. Seri Kimmer, MD FACG 

## 2015-09-06 ENCOUNTER — Encounter: Payer: Self-pay | Admitting: Gastroenterology

## 2015-09-06 ENCOUNTER — Ambulatory Visit (AMBULATORY_SURGERY_CENTER): Payer: Medicare Other | Admitting: Gastroenterology

## 2015-09-06 VITALS — BP 143/77 | HR 87 | Temp 99.0°F | Resp 41

## 2015-09-06 DIAGNOSIS — J45909 Unspecified asthma, uncomplicated: Secondary | ICD-10-CM | POA: Diagnosis not present

## 2015-09-06 DIAGNOSIS — M545 Low back pain: Secondary | ICD-10-CM | POA: Diagnosis not present

## 2015-09-06 DIAGNOSIS — K5730 Diverticulosis of large intestine without perforation or abscess without bleeding: Secondary | ICD-10-CM | POA: Diagnosis not present

## 2015-09-06 DIAGNOSIS — K625 Hemorrhage of anus and rectum: Secondary | ICD-10-CM | POA: Diagnosis not present

## 2015-09-06 DIAGNOSIS — E669 Obesity, unspecified: Secondary | ICD-10-CM | POA: Diagnosis not present

## 2015-09-06 DIAGNOSIS — K640 First degree hemorrhoids: Secondary | ICD-10-CM

## 2015-09-06 DIAGNOSIS — F329 Major depressive disorder, single episode, unspecified: Secondary | ICD-10-CM | POA: Diagnosis not present

## 2015-09-06 DIAGNOSIS — K921 Melena: Secondary | ICD-10-CM | POA: Diagnosis not present

## 2015-09-06 MED ORDER — SODIUM CHLORIDE 0.9 % IV SOLN: 500.0000 mL | INTRAVENOUS | Status: DC

## 2015-09-06 NOTE — Progress Notes (Signed)
Report to PACU, RN, vss, BBS= Clear.  

## 2015-09-06 NOTE — Op Note (Addendum)
[facility]  [employer]. Faith Edwards, [id number]   COLONOSCOPY PROCEDURE REPORT  PATIENT: Faith Edwards, Faith Edwards  MR#: [id number] BIRTHDATE: 1932/03/03 , 83  yrs. old GENDER: female ENDOSCOPIST: Ladene Artist, MD, [facility] REFERRED BY: Eldridge Abrahams, NP PROCEDURE DATE:  09/06/2015 PROCEDURE:   Colonoscopy, diagnostic First Screening Colonoscopy - Avg.  risk and is 50 yrs.  old or older - No.  Prior Negative Screening - Now for repeat screening. N/A  History of Adenoma - Now for follow-up colonoscopy & has been > or = to 3 yrs.  N/A  Polyps removed today? No Recommend repeat exam, <10 yrs? No ASA CLASS:   Class III INDICATIONS:Evaluation of unexplained GI bleeding and hematochezia.  MEDICATIONS: Monitored anesthesia care and Propofol 200 mg IV DESCRIPTION OF PROCEDURE:   After the risks benefits and alternatives of the procedure were thoroughly explained, informed consent was obtained.  The digital rectal exam revealed no abnormalities of the rectum.   The LB [id number]  endoscope was introduced through the anus and advanced to the cecum, which was identified by both the appendix and ileocecal valve. No adverse events experienced.   The quality of the prep was good.  (Suprep was used)  The instrument was then slowly withdrawn as the colon was fully examined. Estimated blood loss is zero unless otherwise noted in this procedure report.    COLON FINDINGS: There was mild diverticulosis noted in the sigmoid colon.   The examination was otherwise normal.  Retroflexed views revealed internal Grade I hemorrhoids. The time to cecum = 5.9 Withdrawal time = 10.6   The scope was withdrawn and the procedure completed. COMPLICATIONS: There were no immediate complications.  ENDOSCOPIC IMPRESSION: 1.   Mild diverticulosis in the sigmoid colon 2.   Grade l internal hemorrhoids  RECOMMENDATIONS: 1.  High fiber diet with liberal fluid intake.  eSigned:  Ladene Artist, MD, [facility]  09/06/2015 2:31 PM

## 2015-09-06 NOTE — Patient Instructions (Signed)
YOU HAD AN ENDOSCOPIC PROCEDURE TODAY AT THE [facility]:   Refer to the procedure report that was given to you for any specific questions about what was found during the examination.  If the procedure report does not answer your questions, please call your gastroenterologist to clarify.  If you requested that your care partner not be given the details of your procedure findings, then the procedure report has been included in a sealed envelope for you to review at your convenience later.  YOU SHOULD EXPECT: Some feelings of bloating in the abdomen. Passage of more gas than usual.  Walking can help get rid of the air that was put into your GI tract during the procedure and reduce the bloating. If you had a lower endoscopy (such as a colonoscopy or flexible sigmoidoscopy) you may notice spotting of blood in your stool or on the toilet paper. If you underwent a bowel prep for your procedure, you may not have a normal bowel movement for a few days.  Please Note:  You might notice some irritation and congestion in your nose or some drainage.  This is from the oxygen used during your procedure.  There is no need for concern and it should clear up in a day or so.  SYMPTOMS TO REPORT IMMEDIATELY:   Following lower endoscopy (colonoscopy or flexible sigmoidoscopy):  Excessive amounts of blood in the stool  Significant tenderness or worsening of abdominal pains  Swelling of the abdomen that is new, acute  Fever of 100F or higher   For urgent or emergent issues, a gastroenterologist can be reached at any hour by calling [phone].   DIET: Your first meal following the procedure should be a small meal and then it is ok to progress to your normal diet. Heavy or fried foods are harder to digest and may make you feel nauseous or bloated.  Likewise, meals heavy in dairy and vegetables can increase bloating.  Drink plenty of fluids but you should avoid alcoholic beverages for 24  hours.  ACTIVITY:  You should plan to take it easy for the rest of today and you should NOT DRIVE or use heavy machinery until tomorrow (because of the sedation medicines used during the test).    FOLLOW UP: Our staff will call the number listed on your records the next business day following your procedure to check on you and address any questions or concerns that you may have regarding the information given to you following your procedure. If we do not reach you, we will leave a message.  However, if you are feeling well and you are not experiencing any problems, there is no need to return our call.  We will assume that you have returned to your regular daily activities without incident.  If any biopsies were taken you will be contacted by phone or by letter within the next 1-3 weeks.  Please call us at [phone] if you have not heard about the biopsies in 3 weeks.    SIGNATURES/CONFIDENTIALITY: You and/or your care partner have signed paperwork which will be entered into your electronic medical record.  These signatures attest to the fact that that the information above on your After Visit Summary has been reviewed and is understood.  Full responsibility of the confidentiality of this discharge information lies with you and/or your care-partner. 

## 2015-09-07 ENCOUNTER — Telehealth: Payer: Self-pay | Admitting: *Deleted

## 2015-09-07 NOTE — Telephone Encounter (Signed)
  Follow up Call-  Call back number 09/06/2015  Post procedure Call Back phone  # [phone]  Permission to leave phone message Yes     Patient questions:  Do you have a fever, pain , or abdominal swelling? No. Pain Score  0 *  Have you tolerated food without any problems? Yes.    Have you been able to return to your normal activities? Yes.    Do you have any questions about your discharge instructions: Diet   No. Medications  No. Follow up visit  No.  Do you have questions or concerns about your Care? No.  Actions: * If pain score is 4 or above: No action needed, pain <4.  Pt. Stated that it was a very good experience and everyone was so nice.

## 2015-09-09 ENCOUNTER — Telehealth: Payer: Self-pay | Admitting: Gastroenterology

## 2015-09-09 NOTE — Telephone Encounter (Signed)
Patient reports that she has not had a BM since her colonoscopy.  She is advised that may take a while to re-establish mer normal bowel habits prior to the colonoscopy.  She is advised that she has a history of constipation and may take a little longer in her case.  She is encouraged to follow a hight fiber diet and to try MOM as needed, since her Miralax is not helping.  She will call back for any additional questions or concerns,.

## 2015-09-12 ENCOUNTER — Other Ambulatory Visit (INDEPENDENT_AMBULATORY_CARE_PROVIDER_SITE_OTHER): Payer: Medicare Other

## 2015-09-12 ENCOUNTER — Encounter: Payer: Self-pay | Admitting: Family

## 2015-09-12 ENCOUNTER — Ambulatory Visit: Payer: Medicare Other | Admitting: Family

## 2015-09-12 ENCOUNTER — Ambulatory Visit (INDEPENDENT_AMBULATORY_CARE_PROVIDER_SITE_OTHER): Payer: Medicare Other | Admitting: Family

## 2015-09-12 VITALS — BP 122/70 | HR 88 | Temp 98.2°F | Resp 18 | Ht 66.0 in | Wt 168.0 lb

## 2015-09-12 DIAGNOSIS — R946 Abnormal results of thyroid function studies: Secondary | ICD-10-CM | POA: Diagnosis not present

## 2015-09-12 DIAGNOSIS — Z7189 Other specified counseling: Secondary | ICD-10-CM

## 2015-09-12 DIAGNOSIS — R7989 Other specified abnormal findings of blood chemistry: Secondary | ICD-10-CM

## 2015-09-12 DIAGNOSIS — Z712 Person consulting for explanation of examination or test findings: Secondary | ICD-10-CM

## 2015-09-12 LAB — TSH: TSH: 0.04 u[IU]/mL — ABNORMAL LOW (ref 0.35–4.50)

## 2015-09-12 NOTE — Assessment & Plan Note (Signed)
Reviewed blood work from office visit on 03/16/15. All lab results were reviewed and questions were answered. Patient declined any additional questions following discussion.

## 2015-09-12 NOTE — Assessment & Plan Note (Signed)
Symptoms and exam consistent thyroid related dysfunction and possible hyperthyroidism given description. Obtain TSH. Follow up pending TSH results.

## 2015-09-12 NOTE — Patient Instructions (Signed)
Thank you for choosing [facility].  Summary/Instructions:  Please stop by the lab on the basement level of the building for your blood work. Your results will be released to [facility] (or called to you) after review, usually within 72 hours after test completion. If any changes need to be made, you will be notified at that same time.  If your symptoms worsen or fail to improve, please contact our office for further instruction, or in case of emergency go directly to the emergency room at the closest medical facility.    Hypothyroidism The thyroid is a large gland located in the lower front of your neck. The thyroid gland helps control metabolism. Metabolism is how your body handles food. It controls metabolism with the hormone thyroxine. When this gland is underactive (hypothyroid), it produces too little hormone.  CAUSES These include:   Absence or destruction of thyroid tissue.  Goiter due to iodine deficiency.  Goiter due to medications.  Congenital defects (since birth).  Problems with the pituitary. This causes a lack of TSH (thyroid stimulating hormone). This hormone tells the thyroid to turn out more hormone. SYMPTOMS  Lethargy (feeling as though you have no energy)  Cold intolerance  Weight gain (in spite of normal food intake)  Dry skin  Coarse hair  Menstrual irregularity (if severe, may lead to infertility)  Slowing of thought processes Cardiac problems are also caused by insufficient amounts of thyroid hormone. Hypothyroidism in the newborn is cretinism, and is an extreme form. It is important that this form be treated adequately and immediately or it will lead rapidly to retarded physical and mental development. DIAGNOSIS  To prove hypothyroidism, your caregiver may do blood tests and ultrasound tests. Sometimes the signs are hidden. It may be necessary for your caregiver to watch this illness with blood tests either before or after diagnosis and  treatment. TREATMENT  Low levels of thyroid hormone are increased by using synthetic thyroid hormone. This is a safe, effective treatment. It usually takes about four weeks to gain the full effects of the medication. After you have the full effect of the medication, it will generally take another four weeks for problems to leave. Your caregiver may start you on low doses. If you have had heart problems the dose may be gradually increased. It is generally not an emergency to get rapidly to normal. HOME CARE INSTRUCTIONS   Take your medications as your caregiver suggests. Let your caregiver know of any medications you are taking or start taking. Your caregiver will help you with dosage schedules.  As your condition improves, your dosage needs may increase. It will be necessary to have continuing blood tests as suggested by your caregiver.  Report all suspected medication side effects to your caregiver. SEEK MEDICAL CARE IF: Seek medical care if you develop:  Sweating.  Tremulousness (tremors).  Anxiety.  Rapid weight loss.  Heat intolerance.  Emotional swings.  Diarrhea.  Weakness. SEEK IMMEDIATE MEDICAL CARE IF:  You develop chest pain, an irregular heart beat (palpitations), or a rapid heart beat. MAKE SURE YOU:   Understand these instructions.  Will watch your condition.  Will get help right away if you are not doing well or get worse. Document Released: 12/17/2005 Document Revised: 03/10/2012 Document Reviewed: 08/06/2008 [facility] Patient Information 2015 [place], [place]. This information is not intended to replace advice given to you by your health care provider. Make sure you discuss any questions you have with your health care provider.

## 2015-09-12 NOTE — Progress Notes (Signed)
Subjective:    Patient ID: Faith Edwards, female    DOB: [birth year], 81 y.o.   MRN: [id number]  Chief Complaint  Patient presents with  . Thyroid check    TSH check, starting to have weird sxs, sweating out of no where, shaky at times, insomnia, dry skin and hair and low sex drive    HPI:  Faith Edwards is a 81 y.o. female with a PMH of chronic rhinitis, asthma, osteoporosis, depression, spinal stenosis, low back pain, and this incisional hernia who presents today for a follow-up office visit.   1.) Thyroid symptoms - Associated symptoms of sweating, shaky, and changes to her skin, hair and nails. She also reports a decreased libido. Has previously had her TSH tested and noted to be 4.09. Not currently maintained on any medications.   Lab Results  Component Value Date   TSH 0.04* 09/12/2015    2.) Review of labs - Requesting to go over the labs from her prior visit.   Allergies  Allergen Reactions  . Penicillins Hives  . Sulfa Antibiotics Hives and Nausea And Vomiting    7-15    Current Outpatient Prescriptions on File Prior to Visit  Medication Sig Dispense Refill  . albuterol (PROVENTIL HFA;VENTOLIN HFA) 108 (90 BASE) MCG/ACT inhaler Inhale 2 puffs into the lungs every 6 (six) hours as needed for wheezing or shortness of breath. 1 Inhaler 0  . Aspirin-Acetaminophen-Caffeine (EXCEDRIN PO) Take 1 tablet by mouth every 6 (six) hours as needed (for migraines).     . cholecalciferol (VITAMIN D) 1000 UNITS tablet Take 1,000 Units by mouth daily.    . Cyanocobalamin 1000 MCG/15ML LIQD Take 1,000 mcg by mouth every morning.    . DULoxetine (CYMBALTA) 30 MG capsule Take 90 mg by mouth daily.    . DULoxetine (CYMBALTA) 60 MG capsule Take 60 mg by mouth daily.    Marland Kitchen LORazepam (ATIVAN) 1 MG tablet Take 0.5-1 mg by mouth 2 (two) times daily as needed for anxiety or sleep.     . [DISCONTINUED] Fluticasone-Salmeterol (ADVAIR) 100-50 MCG/DOSE AEPB Inhale 1 puff into the lungs 2 (two) times  daily.     No current facility-administered medications on file prior to visit.    Review of Systems  Constitutional: Negative for fever and chills.  Endocrine: Positive for heat intolerance. Negative for cold intolerance.  Neurological: Negative for tremors and weakness.      Objective:    BP 122/70 mmHg  Pulse 88  Temp(Src) 98.2 F (36.8 C) (Oral)  Resp 18  Ht 5\' 6"  (1.676 m)  Wt 168 lb (76.204 kg)  BMI 27.13 kg/m2  SpO2 94% Nursing note and vital signs reviewed.  Physical Exam  Constitutional: She is oriented to person, place, and time. She appears well-developed and well-nourished. No distress.  Neck: Neck supple. No thyromegaly present.  Cardiovascular: Normal rate, regular rhythm, normal heart sounds and intact distal pulses.   Pulmonary/Chest: Effort normal and breath sounds normal.  Neurological: She is alert and oriented to person, place, and time.  Skin: Skin is warm and dry.  Psychiatric: She has a normal mood and affect. Her behavior is normal. Judgment and thought content normal.     Assessment & Plan:   Problem List Items Addressed This Visit      Other   Elevated TSH - Primary    Symptoms and exam consistent thyroid related dysfunction and possible hyperthyroidism given description. Obtain TSH. Follow up pending TSH results.  Relevant Orders   TSH (Completed)   Encounter to discuss test results    Reviewed blood work from office visit on 03/16/15. All lab results were reviewed and questions were answered. Patient declined any additional questions following discussion.

## 2015-09-12 NOTE — Progress Notes (Signed)
Pre visit review using our clinic review tool, if applicable. No additional management support is needed unless otherwise documented below in the visit note. 

## 2015-09-19 ENCOUNTER — Other Ambulatory Visit (INDEPENDENT_AMBULATORY_CARE_PROVIDER_SITE_OTHER): Payer: Medicare Other

## 2015-09-19 ENCOUNTER — Encounter: Payer: Self-pay | Admitting: Family

## 2015-09-19 DIAGNOSIS — R7989 Other specified abnormal findings of blood chemistry: Secondary | ICD-10-CM

## 2015-09-19 DIAGNOSIS — R946 Abnormal results of thyroid function studies: Secondary | ICD-10-CM

## 2015-09-19 LAB — TSH: TSH: 0.02 u[IU]/mL — AB (ref 0.35–4.50)

## 2015-09-19 LAB — T4: T4, Total: 7.4 ug/dL (ref 4.5–12.0)

## 2015-09-21 ENCOUNTER — Telehealth: Payer: Self-pay | Admitting: Family

## 2015-09-21 NOTE — Telephone Encounter (Signed)
LVM for pt to call back.

## 2015-09-21 NOTE — Telephone Encounter (Signed)
Pt request lab test result that was done on 09/12/15. Pt stated she does not use my chart and she need to know this result. Please call her at [phone]

## 2015-09-23 NOTE — Telephone Encounter (Signed)
Pt came into office to get results. Her phone was not working. Pt aware of results.

## 2015-09-26 DIAGNOSIS — F064 Anxiety disorder due to known physiological condition: Secondary | ICD-10-CM | POA: Diagnosis not present

## 2015-09-29 ENCOUNTER — Encounter: Payer: Self-pay | Admitting: Internal Medicine

## 2015-09-29 ENCOUNTER — Ambulatory Visit (INDEPENDENT_AMBULATORY_CARE_PROVIDER_SITE_OTHER): Payer: Medicare Other | Admitting: Internal Medicine

## 2015-09-29 VITALS — BP 130/62 | HR 92 | Temp 98.2°F | Resp 12 | Wt 167.4 lb

## 2015-09-29 DIAGNOSIS — E0591 Thyrotoxicosis, unspecified with thyrotoxic crisis or storm: Secondary | ICD-10-CM | POA: Diagnosis not present

## 2015-09-29 LAB — TSH: TSH: 0.04 u[IU]/mL — AB (ref 0.35–4.50)

## 2015-09-29 LAB — T3, FREE: T3, Free: 3.7 pg/mL (ref 2.3–4.2)

## 2015-09-29 LAB — T4, FREE: FREE T4: 1.02 ng/dL (ref 0.60–1.60)

## 2015-09-29 MED ORDER — ATENOLOL 25 MG PO TABS
25.0000 mg | ORAL_TABLET | Freq: Every day | ORAL | Status: DC
Start: 2015-09-29 — End: 2016-01-23

## 2015-09-29 NOTE — Patient Instructions (Addendum)
Please stop at the lab.   When the results are back, we may need to get a thyroid uptake and scan test.  Please start Atenolol 25 mg at night.  Please come back for a follow-up appointment in 3 months.  Hyperthyroidism The thyroid is a large gland located in the lower front part of your neck. The thyroid helps control metabolism. Metabolism is how your body uses food. It controls metabolism with the hormone thyroxine. When the thyroid is overactive, it produces too much hormone. When this happens, these following problems may occur:   Nervousness  Heat intolerance  Weight loss (in spite of increase food intake)  Diarrhea  Change in hair or skin texture  Palpitations (heart skipping or having extra beats)  Tachycardia (rapid heart rate)  Loss of menstruation (amenorrhea)  Shaking of the hands CAUSES  Grave's Disease (the immune system attacks the thyroid gland). This is the most common cause.  Inflammation of the thyroid gland.  Tumor (usually benign) in the thyroid gland or elsewhere.  Excessive use of thyroid medications (both prescription and 'natural').  Excessive ingestion of Iodine. DIAGNOSIS  To prove hyperthyroidism, your caregiver may do blood tests and ultrasound tests. Sometimes the signs are hidden. It may be necessary for your caregiver to watch this illness with blood tests, either before or after diagnosis and treatment. TREATMENT Short-term treatment There are several treatments to control symptoms. Drugs called beta blockers may give some relief. Drugs that decrease hormone production will provide temporary relief in many people. These measures will usually not give permanent relief. Definitive therapy There are treatments available which can be discussed between you and your caregiver which will permanently treat the problem. These treatments range from surgery (removal of the thyroid), to the use of radioactive iodine (destroys the thyroid by radiation),  to the use of antithyroid drugs (interfere with hormone synthesis). The first two treatments are permanent and usually successful. They most often require hormone replacement therapy for life. This is because it is impossible to remove or destroy the exact amount of thyroid required to make a person euthyroid (normal). HOME CARE INSTRUCTIONS  See your caregiver if the problems you are being treated for get worse. Examples of this would be the problems listed above. SEEK MEDICAL CARE IF: Your general condition worsens. MAKE SURE YOU:   Understand these instructions.  Will watch your condition.  Will get help right away if you are not doing well or get worse. Document Released: 12/17/2005 Document Revised: 03/10/2012 Document Reviewed: 04/30/2007 [facility] Patient Information 2015 [place], [place]. This information is not intended to replace advice given to you by your health care provider. Make sure you discuss any questions you have with your health care provider.  Nuclear Medicine Exam A nuclear medicine exam is a safe and painless imaging test. It helps to detect and diagnose disease in the body as well as provide information about organ function and structure.  Nuclear scans are most often done of the:  Lungs.  Heart.  Thyroid gland.  Bones.  Abdomen. HOW A NUCLEAR MEDICINE EXAM WORKS A nuclear medicine exam works by using a radioactive tracer. The material is given either by an IV (intravenous) injection or it may be swallowed. After the tracer is in the body, it is absorbed by your body's organs. A large scanning machine that uses a special camera detects the radioactivity in your body. A computerized image is then formed regarding the area of concern. The small amounts of radioactive material used  in a nuclear medicine exam are found to be medically safe. However, because radioactive material is used, this test is not done if you are pregnant or nursing.  BEFORE THE PROCEDURE  If  available, bring previous imaging studies such as x-rays, etc. with you to the exam.  Arrive early for your exam. PROCEDURE  An IV may be started before the exam begins.  Depending on the type of examination, will lie on a table or sit in a chair during the exam.  The nuclear medicine exam will take about 30 to 60 minutes to complete. AFTER THE PROCEDURE  After your scan is completed, the image(s) will be evaluated by a specialist. It is important that you follow up with your caregiver to find out your test results.  You may return to your regular activity as instructed by your caregiver. SEEK IMMEDIATE MEDICAL CARE IF: You have shortness of breath or difficulty breathing. MAKE SURE YOU:   Understand these instructions.  Will watch your condition.  Will get help right away if you are not doing well or get worse. Document Released: 01/24/2005 Document Revised: 03/10/2012 Document Reviewed: 03/10/2009 [facility] Patient Information 2015 [place], [place]. This information is not intended to replace advice given to you by your health care provider. Make sure you discuss any questions you have with your health care provider.

## 2015-09-29 NOTE — Progress Notes (Addendum)
Patient ID: [name], female   DOB: 04/07/1932, 81 y.o.   MRN: [id number]   HPI  Faith Edwards is a 81 y.o.-year-old female, referred by her PCP, Faith Po, FNP, for evaluation for thyrotoxicosis.  She started to have excess sweating this summer. She also describes tremors, occasional palpitations (had this off an on), lack of libido, lack of appetite, 4 lb weight loss.   I reviewed pt's thyroid tests: Lab Results  Component Value Date   TSH 0.02* 09/19/2015   TSH 0.04* 09/12/2015   TSH 4.09 01/07/2014   TSH 3.54 05/07/2012   TSH 3.89 03/16/2011   TSH 3.77 09/05/2010   TSH 3.57 01/11/2010   TSH 3.74 08/05/2008   FREET4 0.9 08/05/2008    Pt denies feeling nodules in neck, + hoarseness, + occasional dysphagia/no odynophagia, no SOB with lying down; she c/o: - + fatigue - + excessive sweating/heat intolerance - + tremors - + anxiety - + palpitations - + hyperdefecation - + weight loss - + hair loss - + insomnia.  Pt does have a FH of thyroid ds.: + mother. No FH of thyroid cancer. No h/o radiation tx to head or neck.  No seaweed or kelp, no recent contrast studies. No steroid use. No herbal supplements. No Biotin use.  ROS: Constitutional: + see HPI, + poor sleep, + nocturia Eyes: + blurry vision, no xerophthalmia ENT: no sore throat, no nodules palpated in throat, + dysphagia/no odynophagia, + hoarseness, + tinnitus, + hypoacusis Cardiovascular: no CP/+ SOB/+ palpitations/no leg swelling Respiratory:+  cough/+ SOB Gastrointestinal: no N/V/+ D/+ C, no heartburn Musculoskeletal: + muscle aches/+ joint aches Skin: no rashes, + itching, + easy bruising, + hair loss Neurological: + tremors/no numbness/tingling/dizziness, + HA Psychiatric: + both: depression/anxiety  Past Medical History  Diagnosis Date  . Pulmonary embolism 1993    on HRT  . Asthma   . Hx of blood clots 2007 & 2010    post TKR  . Chronic headaches     [facility]  . Trauma     left lower  leg 10/22/2014   . Pneumonia     hx of 1952  . Bronchitis     hx of   . Shortness of breath dyspnea   . Depression   . Anxiety   . Cystitis     1954  . Urinary frequency   . Arthritis   . Skin cancer     basal cell   . History of blood transfusion     2010  . Spinal stenosis   . Shaking     hands bilat  . Ear pain     bilat   . Pelvic fracture     hx of in three places secondary to fall   . Right wrist fracture     hx of 1995 has plate secondary to fall    Past Surgical History  Procedure Laterality Date  . 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 Cholecystectomy    . Colonoscopy      diverticulosis  . Esi   11/2013     C7-T1 ; Dr Nelva Bush  . Breast enhancement surgery  1985  . Sp facet injection  01/19/14    RC2-3,3-4,C4-5  . Abdominal hysterectomy    . Tonsillectomy    . Eye surgery      bilat cataract surgery   . Ventral hernia repair N/A 07/08/2015    Procedure: VENTRAL ADULT REPAIR VENTRAL INCISIONAL HERNIA REPAIR;  Surgeon: Armandina Gemma, MD;  Location: [facility];  Service: General;  Laterality: N/A;  . Insertion of mesh N/A 07/08/2015    Procedure: INSERTION OF MESH;  Surgeon: Armandina Gemma, MD;  Location: [facility];  Service: General;  Laterality: N/A;   Social History   Social History  . Marital Status: Married    Spouse Name: N/A  . Number of Children: 3  . Years of Education: N/A   Occupational History  . retired    Social History Main Topics  . Smoking status: Never Smoker   . Smokeless tobacco: Never Used  . Alcohol Use: Yes     Comment: 2-3 glasses of wine weekly   . Drug Use: No  . Sexual Activity: Not on file   Other Topics Concern  . Not on file   Social History Narrative   Current Outpatient Prescriptions on File Prior to Visit  Medication Sig Dispense Refill  . albuterol (PROVENTIL HFA;VENTOLIN HFA) 108 (90 BASE) MCG/ACT inhaler Inhale 2 puffs into the lungs every 6 (six) hours as needed for wheezing or shortness of breath. 1 Inhaler 0   . Aspirin-Acetaminophen-Caffeine (EXCEDRIN Edwards) Take 1 tablet by mouth every 6 (six) hours as needed (for migraines).     . cholecalciferol (VITAMIN D) 1000 UNITS tablet Take 1,000 Units by mouth daily.    . Cyanocobalamin 1000 MCG/15ML LIQD Take 1,000 mcg by mouth every morning.    . DULoxetine (CYMBALTA) 30 MG capsule Take 30 mg by mouth daily. Take along with the 60 mg    . DULoxetine (CYMBALTA) 60 MG capsule Take 60 mg by mouth daily.    Marland Kitchen LORazepam (ATIVAN) 1 MG tablet Take 0.5-1 mg by mouth 2 (two) times daily as needed for anxiety or sleep.     . [DISCONTINUED] Fluticasone-Salmeterol (ADVAIR) 100-50 MCG/DOSE AEPB Inhale 1 puff into the lungs 2 (two) times daily.     No current facility-administered medications on file prior to visit.   Allergies  Allergen Reactions  . Penicillins Hives  . Sulfa Antibiotics Hives and Nausea And Vomiting    7-15   Family History  Problem Relation Age of Onset  . Allergies Brother   . Asthma Brother   . Heart disease Brother   . Clotting disorder Father     PTE X 2  . Skin cancer Father   . Skin cancer Mother   . Sudden death      2 M aunts & 2 M uncles in 26s  . Heart attack Brother     > 55  . Stroke Neg Hx   . Diabetes Daughter   . Diabetes Son   . Mental illness Son     suicide   PE: BP 130/62 mmHg  Pulse 92  Temp(Src) 98.2 F (36.8 C) (Oral)  Resp 12  Wt 167 lb 6.4 oz (75.932 kg)  SpO2 95% Body mass index is 27.03 kg/(m^2). Wt Readings from Last 3 Encounters:  09/29/15 167 lb 6.4 oz (75.932 kg)  09/12/15 168 lb (76.204 kg)  09/01/15 160 lb (72.576 kg)   Constitutional: normal weight, in NAD Eyes: PERRLA, EOMI, no exophthalmos, no lid lag, no stare ENT: moist mucous membranes, no thyromegaly, no cervical lymphadenopathy Cardiovascular: + tachycardia, RR, No MRG Respiratory: CTA B Gastrointestinal: abdomen soft, NT, ND, BS+ Musculoskeletal: no deformities, strength intact in all 4 Skin: moist, warm, no  rashes Neurological: + tremor with outstretched hands, DTR normal in all 4  ASSESSMENT: 1. Thyrotoxicosis - 2x low TSH  PLAN:  1. Patient with a recently found low TSH, with thyrotoxic sxs: weight loss, heat intolerance, hyperdefecation, palpitations, anxiety.  - she does not appear to have exogenous causes for the low TSH.  - We discussed that possible causes of thyrotoxicosis are:  Graves ds   Thyroiditis toxic multinodular goiter/ toxic adenoma (I cannot feel nodules at palpation of her thyroid). - I suggested that we check the TSH, fT3 and fT4 and also add thyroid stimulating antibodies to screen for Graves' disease.  - If the tests remain abnormal, we may need an uptake and scan to differentiate between the 3 above possible etiologies  - we discussed about possible modalities of treatment for the above conditions, to include methimazole use, radioactive iodine ablation or (last resort) surgery. - we might need to do thyroid ultrasound depending on the results of the uptake and scan (if a cold nodule is present) - Since she has tachycardia, tremors and insomnia >> will add low dose of atenolol, 25 mg daily. Will not add a higher dose due to the fact that she usually has low blood pressure. She mentions that the [id number] systolic blood pressure that she got today's actually high for her. - RTC in 3 months, but likely sooner for repeat labs  Office Visit on 09/29/2015  Component Date Value Ref Range Status  . TSH 09/29/2015 0.04* 0.35 - 4.50 uIU/mL Final  . Free T4 09/29/2015 1.02  0.60 - 1.60 ng/dL Final  . T3, Free 09/29/2015 3.7  2.3 - 4.2 pg/mL Final   TSI pending.  We will call and inquire the lab about the results.  TSH is still low, while free T4 and free T3 are normal, confirming subclinical hyperthyroidism. Since she is symptomatic, will start a low-dose methimazole, 5 mg daily and will recheck her thyroid tests when she comes back in 3 months.  If the TSI's are high, I will  consider that she has Graves' disease, and we may be able to bypass the thyroid uptake and scan.

## 2015-09-30 ENCOUNTER — Ambulatory Visit (INDEPENDENT_AMBULATORY_CARE_PROVIDER_SITE_OTHER): Payer: Medicare Other | Admitting: Internal Medicine

## 2015-09-30 ENCOUNTER — Encounter: Payer: Self-pay | Admitting: Internal Medicine

## 2015-09-30 VITALS — BP 112/62 | HR 110 | Temp 98.6°F | Ht 65.75 in | Wt 167.8 lb

## 2015-09-30 DIAGNOSIS — Z23 Encounter for immunization: Secondary | ICD-10-CM | POA: Diagnosis not present

## 2015-09-30 DIAGNOSIS — F329 Major depressive disorder, single episode, unspecified: Secondary | ICD-10-CM | POA: Diagnosis not present

## 2015-09-30 DIAGNOSIS — R946 Abnormal results of thyroid function studies: Secondary | ICD-10-CM

## 2015-09-30 DIAGNOSIS — R7989 Other specified abnormal findings of blood chemistry: Secondary | ICD-10-CM

## 2015-09-30 DIAGNOSIS — H9201 Otalgia, right ear: Secondary | ICD-10-CM | POA: Diagnosis not present

## 2015-09-30 DIAGNOSIS — F32A Depression, unspecified: Secondary | ICD-10-CM

## 2015-09-30 DIAGNOSIS — M810 Age-related osteoporosis without current pathological fracture: Secondary | ICD-10-CM | POA: Diagnosis not present

## 2015-09-30 DIAGNOSIS — E2839 Other primary ovarian failure: Secondary | ICD-10-CM

## 2015-09-30 DIAGNOSIS — H9209 Otalgia, unspecified ear: Secondary | ICD-10-CM | POA: Insufficient documentation

## 2015-09-30 NOTE — Assessment & Plan Note (Signed)
Saw endocrine yesterday Placed on low dose atenolol 25 mg daily to help control symptoms Additional tests ordered, tfts, thyroid antibodies and possible scans She will follow up with endocrine

## 2015-09-30 NOTE — Progress Notes (Signed)
Pre visit review using our clinic review tool, if applicable. No additional management support is needed unless otherwise documented below in the visit note. 

## 2015-09-30 NOTE — Assessment & Plan Note (Signed)
Due for a dexa, which was ordered today Continue regular exercise Need to check vitamin d next spring with her wellness visit, sooner depending on dexa

## 2015-09-30 NOTE — Patient Instructions (Addendum)
You received a flu shot and pneumonia vaccine.    dexa ordered   We have reviewed your prior records including labs and tests today   Medications reviewed and updated  No changes recommended at this time.   Please schedule followup in 6 months for a wellness visit, call sooner if problems.

## 2015-09-30 NOTE — Progress Notes (Signed)
Subjective:    Patient ID: Faith Edwards, female    DOB: May 06, 1932, 81 y.o.   MRN: [id number]  HPI  Elevated tsh:  She saw endocrine yesterday for evaluation of elevated tsh.   She has been symptomatic with weight loss, fatigue, excessive sweating and heat intolerance, palpitations, increased bm's, insomnia, hair loss and tremors.  She was placed on low dose atenolol - not higher dose to low bp.  She has not started the medication yet.  She will have repeat blood work and additional tests if needed and will follow up with endo.   Depression:  She is taking cymbalta daily as prescribed.  She only takes the ativan as needed.  She follows with psychiatry and feels her depression and anxiety are well controlled.   History of PE, DVT x 2:  dvt s/p TKR and PE was on HRT.  She does have a family history of PE - her father.  She is not currently on any anticoagulation.   Ear ache, Fluid in ear:   Recently she has had some intermittent right ear ache and it feels like there is fluid in her ear.  She thinks this is related to seasonal allergies.  She has a cough, post nasal drip and some wheezing all related to allergies.  She uses an albuterol inhaler only as needed and it helps.  She denies fever/chills, sore throat, nasal congestion.  She does not take allergy medication because she prefers not to take any medication if it is not needed.   Feet blue and numb:  For years she has had numbness in her toes and her feet look blue at times especially when standing.  She denies pain and swelling. She has never smoked.  She thinks this is related to poor circulation.  She walks daily.  She tries to elevate her feet and this helps. She denies back pain.  She denies numbness elsewhere.   Osteoporosis:  She has not had a bone density in a long time and is dur for one.   Walks a mile a few times a week.  She meditates.   Past Medical History  Diagnosis Date  . Pulmonary embolism 1993    on HRT  . Asthma   .  Hx of blood clots 2007 & 2010    post TKR  . Chronic headaches     [facility]  . Trauma     left lower leg 10/22/2014   . Pneumonia     hx of 1952  . Bronchitis     hx of   . Shortness of breath dyspnea   . Depression   . Anxiety   . Cystitis     1954  . Urinary frequency   . Arthritis   . Skin cancer     basal cell   . History of blood transfusion     2010  . Spinal stenosis   . Shaking     hands bilat  . Ear pain     bilat   . Pelvic fracture     hx of in three places secondary to fall   . Right wrist fracture     hx of 1995 has plate secondary to fall    Past Surgical History  Procedure Laterality Date  . 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 Cholecystectomy    . Colonoscopy      diverticulosis  . Esi   11/2013     C7-T1 ;  Dr Nelva Bush  . Breast enhancement surgery  1985  . Sp facet injection  01/19/14    RC2-3,3-4,C4-5  . Abdominal hysterectomy    . Tonsillectomy    . Eye surgery      bilat cataract surgery   . Ventral hernia repair N/A 07/08/2015    Procedure: VENTRAL ADULT REPAIR VENTRAL INCISIONAL HERNIA REPAIR;  Surgeon: Armandina Gemma, MD;  Location: [facility];  Service: General;  Laterality: N/A;  . Insertion of mesh N/A 07/08/2015    Procedure: INSERTION OF MESH;  Surgeon: Armandina Gemma, MD;  Location: [facility];  Service: General;  Laterality: N/A;   Social History  Substance Use Topics  . Smoking status: Never Smoker   . Smokeless tobacco: Never Used  . Alcohol Use: Yes     Comment: 2-3 glasses of wine weekly      Review of Systems  Constitutional: Positive for fatigue and unexpected weight change (related to thyroid). Negative for fever and chills.  HENT: Positive for ear pain and postnasal drip. Negative for congestion, hearing loss and sore throat.   Respiratory: Positive for cough (related to allergies), shortness of breath (related hyperthyroidism) and wheezing (related to allergies).   Cardiovascular: Positive for palpitations. Negative  for chest pain and leg swelling.  Gastrointestinal: Negative for abdominal pain.  Musculoskeletal: Positive for neck pain (controlled on cymbalta). Negative for back pain.       Right shoulder pain  Neurological: Positive for tremors. Negative for light-headedness and headaches.       Objective:   Physical Exam  Constitutional: She appears well-developed and well-nourished. No distress.  HENT:  Head: Normocephalic.  Right Ear: External ear normal.  Left Ear: External ear normal.  Nose: Nose normal.  Mouth/Throat: Oropharynx is clear and moist. No oropharyngeal exudate.  Eyes: Conjunctivae are normal.  Neck: No thyromegaly present.  Cardiovascular: Regular rhythm.   No murmur heard. tachycardia  Pulmonary/Chest: Effort normal and breath sounds normal. No respiratory distress. She has no wheezes.  Musculoskeletal: She exhibits no edema.  Lymphadenopathy:    She has no cervical adenopathy.  Skin: No rash noted.      Filed Vitals:   09/30/15 0912  BP: 112/62  Pulse: 110  Temp: 98.6 F (37 C)   Body mass index is 27.28 kg/(m^2).     Assessment & Plan:   She will follow up in 6 months for a wellness visit, sooner if any problems or concerns arise

## 2015-09-30 NOTE — Assessment & Plan Note (Addendum)
Follows with psychiatry Taking Cymbalta 90 mg daily Has occasional anxiety and difficulty sleeping - uses ativan as needed

## 2015-09-30 NOTE — Assessment & Plan Note (Signed)
Exam is normal Ear pain related to seasonal allergies - symptoms are mild and she prefers not to take anything

## 2015-10-04 MED ORDER — METHIMAZOLE 5 MG PO TABS: 5.0000 mg | ORAL_TABLET | Freq: Every day | ORAL | Status: DC

## 2015-10-04 NOTE — Addendum Note (Signed)
Addended by: Philemon Kingdom on: 10/04/2015 03:03 PM   Modules accepted: Orders

## 2015-10-05 LAB — THYROID STIMULATING IMMUNOGLOBULIN: TSI: 531 %{baseline} — AB (ref <140)

## 2015-10-11 ENCOUNTER — Ambulatory Visit (INDEPENDENT_AMBULATORY_CARE_PROVIDER_SITE_OTHER)
Admission: RE | Admit: 2015-10-11 | Discharge: 2015-10-11 | Disposition: A | Discharge status: Home / Self Care | Payer: Medicare Other | Source: Ambulatory Visit | Attending: Internal Medicine | Admitting: Internal Medicine

## 2015-10-11 ENCOUNTER — Other Ambulatory Visit: Payer: Medicare Other

## 2015-10-11 DIAGNOSIS — E2839 Other primary ovarian failure: Secondary | ICD-10-CM | POA: Diagnosis not present

## 2015-10-14 ENCOUNTER — Encounter: Payer: Self-pay | Admitting: Internal Medicine

## 2015-10-14 DIAGNOSIS — M8580 Other specified disorders of bone density and structure, unspecified site: Secondary | ICD-10-CM | POA: Insufficient documentation

## 2015-11-14 ENCOUNTER — Telehealth: Payer: Self-pay | Admitting: Internal Medicine

## 2015-11-14 ENCOUNTER — Other Ambulatory Visit: Payer: Self-pay | Admitting: *Deleted

## 2015-11-14 ENCOUNTER — Telehealth: Payer: Self-pay | Admitting: Gastroenterology

## 2015-11-14 DIAGNOSIS — R7989 Other specified abnormal findings of blood chemistry: Secondary | ICD-10-CM

## 2015-11-14 NOTE — Telephone Encounter (Signed)
Advised patient to contact Dr. Cruzita Lederer office who prescribed the medication Methimazole. Patient verbalized understanding.

## 2015-11-14 NOTE — Telephone Encounter (Signed)
Pt has brown urine for over a week and is very tired, she cannot get any energy back, no yellow skin.

## 2015-11-14 NOTE — Telephone Encounter (Signed)
Let's have her back for LFTs - can you please order? Stop Methimazole for now but try to come tomorrow for labs.  We also need TSH, fT3 and fT4 at the same draw.

## 2015-11-14 NOTE — Telephone Encounter (Signed)
Called pt and advised her per Dr Arman Filter message below. Pt will come in to have labs tomorrow.

## 2015-11-14 NOTE — Telephone Encounter (Signed)
Please read message below and advise.  

## 2015-11-16 ENCOUNTER — Other Ambulatory Visit (INDEPENDENT_AMBULATORY_CARE_PROVIDER_SITE_OTHER): Payer: Medicare Other

## 2015-11-16 DIAGNOSIS — R946 Abnormal results of thyroid function studies: Secondary | ICD-10-CM | POA: Diagnosis not present

## 2015-11-16 DIAGNOSIS — R7989 Other specified abnormal findings of blood chemistry: Secondary | ICD-10-CM

## 2015-11-16 DIAGNOSIS — F064 Anxiety disorder due to known physiological condition: Secondary | ICD-10-CM | POA: Diagnosis not present

## 2015-11-16 LAB — HEPATIC FUNCTION PANEL
ALBUMIN: 4.1 g/dL (ref 3.5–5.2)
ALK PHOS: 106 U/L (ref 39–117)
ALT: 22 U/L (ref 0–35)
AST: 26 U/L (ref 0–37)
BILIRUBIN DIRECT: 0.1 mg/dL (ref 0.0–0.3)
BILIRUBIN TOTAL: 0.6 mg/dL (ref 0.2–1.2)
Total Protein: 7 g/dL (ref 6.0–8.3)

## 2015-11-16 LAB — TSH: TSH: 3.09 u[IU]/mL (ref 0.35–4.50)

## 2015-11-16 LAB — T3, FREE: T3, Free: 3 pg/mL (ref 2.3–4.2)

## 2015-11-16 LAB — T4, FREE: Free T4: 0.62 ng/dL (ref 0.60–1.60)

## 2015-11-18 ENCOUNTER — Telehealth: Payer: Self-pay

## 2015-11-18 NOTE — Telephone Encounter (Signed)
Pt was not able to schedule appt for next year due to sched was not open.   Can you put her in for the same day and time as the appt she had with dr crawford? May need to call pt and verify please.

## 2015-11-21 NOTE — Telephone Encounter (Signed)
Sorry, I haven't been able to call yet.

## 2015-11-21 NOTE — Telephone Encounter (Signed)
done

## 2015-11-21 NOTE — Telephone Encounter (Signed)
Confirmed pt wanted 03/15/2016 at 10 am.   It would not let me schedule - can you add her to the schedule please.

## 2015-12-05 ENCOUNTER — Telehealth: Payer: Self-pay | Admitting: Internal Medicine

## 2015-12-05 NOTE — Telephone Encounter (Signed)
No, this is not usually a SE from [place].

## 2015-12-05 NOTE — Telephone Encounter (Signed)
Please read message below and advise.  

## 2015-12-05 NOTE — Telephone Encounter (Signed)
Patient called stating that she is having UTI symptoms and states that its a side effect from her medication   Rx:  Methimazole   Please advise patient if she needs to see her PCP regarding this   Thank you

## 2015-12-05 NOTE — Telephone Encounter (Signed)
Called pt and advised her per Dr Gherghe's message below. Pt voiced understanding.  

## 2015-12-06 DIAGNOSIS — N390 Urinary tract infection, site not specified: Secondary | ICD-10-CM | POA: Diagnosis not present

## 2015-12-06 DIAGNOSIS — R300 Dysuria: Secondary | ICD-10-CM | POA: Diagnosis not present

## 2015-12-06 DIAGNOSIS — B9620 Unspecified Escherichia coli [E. coli] as the cause of diseases classified elsewhere: Secondary | ICD-10-CM | POA: Diagnosis not present

## 2015-12-13 DIAGNOSIS — F3181 Bipolar II disorder: Secondary | ICD-10-CM | POA: Diagnosis not present

## 2015-12-13 DIAGNOSIS — F411 Generalized anxiety disorder: Secondary | ICD-10-CM | POA: Diagnosis not present

## 2015-12-21 ENCOUNTER — Telehealth: Payer: Self-pay | Admitting: Internal Medicine

## 2015-12-21 NOTE — Telephone Encounter (Signed)
Returned pt's call and advised her that Dr Cruzita Lederer will do labs the day of her appt. Pt voiced understanding.

## 2015-12-21 NOTE — Telephone Encounter (Signed)
Patient called stating that she has an upcoming appointment and would like to know if she needs to come in for labs?  Please advise   Thank you

## 2015-12-29 ENCOUNTER — Encounter: Payer: Self-pay | Admitting: *Deleted

## 2016-01-04 ENCOUNTER — Ambulatory Visit (INDEPENDENT_AMBULATORY_CARE_PROVIDER_SITE_OTHER): Payer: Medicare Other | Admitting: Internal Medicine

## 2016-01-04 ENCOUNTER — Encounter: Payer: Self-pay | Admitting: Internal Medicine

## 2016-01-04 VITALS — BP 104/62 | HR 80 | Temp 97.3°F | Resp 12 | Wt 178.0 lb

## 2016-01-04 DIAGNOSIS — E0500 Thyrotoxicosis with diffuse goiter without thyrotoxic crisis or storm: Secondary | ICD-10-CM | POA: Diagnosis not present

## 2016-01-04 LAB — T4, FREE: Free T4: 0.79 ng/dL (ref 0.60–1.60)

## 2016-01-04 LAB — TSH: TSH: 2.2 u[IU]/mL (ref 0.35–4.50)

## 2016-01-04 NOTE — Patient Instructions (Addendum)
Please take Methimazole 2.5 mg daily.  Please stop at the lab.  Please come back for a follow-up appointment in 6 months.

## 2016-01-04 NOTE — Progress Notes (Signed)
Patient ID: Faith Edwards, female   DOB: 1932-09-11, 82 y.o.   MRN: [id number]   HPI  Faith Edwards is a 82 y.o.-year-old female, returning for f/u for thyrotoxicosis, likely Graves ds. Last visit 3 mo ago.   She had cystitis >> tx with ABx >> now improved.   I reviewed pt's thyroid tests: Lab Results  Component Value Date   TSH 3.09 11/16/2015   TSH 0.04* 09/29/2015   TSH 0.02* 09/19/2015   TSH 0.04* 09/12/2015   TSH 4.09 01/07/2014   TSH 3.54 05/07/2012   TSH 3.89 03/16/2011   TSH 3.77 09/05/2010   TSH 3.57 01/11/2010   TSH 3.74 08/05/2008   FREET4 0.62 11/16/2015   FREET4 1.02 09/29/2015   FREET4 0.9 08/05/2008    Component     Latest Ref Rng 09/29/2015  TSI     <140 % baseline 531 (H)   In the light of the high TSI, we assume that she had Graves' disease and started methimazole. In 11/2015, We decreased MMI to 5 mg qod.   Pt denies feeling nodules in neck, + hoarseness, + occasional dysphagia/no odynophagia, no SOB with lying down; she c/o: - + insomnia - + fatigue - + hair loss  Pt does have a FH of thyroid ds.: + mother. No FH of thyroid cancer. No h/o radiation tx to head or neck.  No seaweed or kelp, no recent contrast studies. No steroid use. No herbal supplements. No Biotin use.  ROS: Constitutional: + see HPI, + poor sleep, + nocturia, + sweating is better Eyes: no blurry vision, no xerophthalmia ENT: no sore throat, no nodules palpated in throat, no dysphagia/no odynophagia, + hoarseness Cardiovascular: no CP/no SOB/+ palpitations/no leg swelling Respiratory:+  cough/no SOB Gastrointestinal: no N/V/DC, + heartburn Musculoskeletal: + muscle aches/+ joint aches Skin: no rashes, + hair loss Neurological:no tremors/no numbness/tingling/dizziness, + HA  I reviewed pt's medications, allergies, PMH, social hx, family hx, and changes were documented in the history of present illness. Otherwise, unchanged from my initial visit note.  Past Medical History   Diagnosis Date  . Pulmonary embolism ([place]) 1993    on HRT  . Asthma   . Hx of blood clots 2007 & 2010    post TKR  . Chronic headaches     [facility]  . Trauma     left lower leg 10/22/2014   . Pneumonia     hx of 1952  . Bronchitis     hx of   . Shortness of breath dyspnea   . Depression   . Anxiety   . Cystitis     1954  . Urinary frequency   . Arthritis   . Skin cancer     basal cell   . History of blood transfusion     2010  . Spinal stenosis   . Shaking     hands bilat  . Ear pain     bilat   . Pelvic fracture ([facility])     hx of in three places secondary to fall   . Right wrist fracture     hx of 1995 has plate secondary to fall    Past Surgical History  Procedure Laterality Date  . 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 Cholecystectomy    . Colonoscopy      diverticulosis  . Esi   11/2013     C7-T1 ; Dr Nelva Bush  . Breast enhancement surgery  1985  .  Sp facet injection  01/19/14    RC2-3,3-4,C4-5  . Abdominal hysterectomy    . Tonsillectomy    . Eye surgery      bilat cataract surgery   . Ventral hernia repair N/A 07/08/2015    Procedure: VENTRAL ADULT REPAIR VENTRAL INCISIONAL HERNIA REPAIR;  Surgeon: Armandina Gemma, MD;  Location: [facility];  Service: General;  Laterality: N/A;  . Insertion of mesh N/A 07/08/2015    Procedure: INSERTION OF MESH;  Surgeon: Armandina Gemma, MD;  Location: [facility];  Service: General;  Laterality: N/A;   Social History   Social History  . Marital Status: Married    Spouse Name: N/A  . Number of Children: 3  . Years of Education: N/A   Occupational History  . retired    Social History Main Topics  . Smoking status: Never Smoker   . Smokeless tobacco: Never Used  . Alcohol Use: Yes     Comment: 2-3 glasses of wine weekly   . Drug Use: No  . Sexual Activity: Not on file   Other Topics Concern  . Not on file   Social History Narrative   Current Outpatient Prescriptions on File Prior to Visit  Medication Sig  Dispense Refill  . albuterol (PROVENTIL HFA;VENTOLIN HFA) 108 (90 BASE) MCG/ACT inhaler Inhale 2 puffs into the lungs every 6 (six) hours as needed for wheezing or shortness of breath. 1 Inhaler 0  . Aspirin-Acetaminophen-Caffeine (EXCEDRIN PO) Take 1 tablet by mouth every 6 (six) hours as needed (for migraines).     Marland Kitchen atenolol (TENORMIN) 25 MG tablet Take 1 tablet (25 mg total) by mouth daily. 30 tablet 2  . cholecalciferol (VITAMIN D) 1000 UNITS tablet Take 1,000 Units by mouth daily.    . Cyanocobalamin 1000 MCG/15ML LIQD Take 2,000 mcg by mouth every morning.     . DULoxetine (CYMBALTA) 30 MG capsule Take 30 mg by mouth daily. Take along with the 60 mg    . DULoxetine (CYMBALTA) 60 MG capsule Take 60 mg by mouth daily.    Marland Kitchen LORazepam (ATIVAN) 1 MG tablet Take 0.5-1 mg by mouth 2 (two) times daily as needed for anxiety or sleep.     . methimazole (TAPAZOLE) 5 MG tablet Take 1 tablet (5 mg total) by mouth daily. (Patient taking differently: Take 5 mg by mouth every other day. ) 45 tablet 2  . [DISCONTINUED] Fluticasone-Salmeterol (ADVAIR) 100-50 MCG/DOSE AEPB Inhale 1 puff into the lungs 2 (two) times daily.     No current facility-administered medications on file prior to visit.   Allergies  Allergen Reactions  . Penicillins Hives  . Sulfa Antibiotics Hives and Nausea And Vomiting    7-15   Family History  Problem Relation Age of Onset  . Allergies Brother   . Asthma Brother   . Heart disease Brother   . Clotting disorder Father     PTE X 2  . Skin cancer Father   . Skin cancer Mother   . Sudden death      2 M aunts & 2 M uncles in 14s  . Heart attack Brother     > 55  . Stroke Neg Hx   . Diabetes Daughter   . Diabetes Son   . Mental illness Son     suicide   PE: BP 104/62 mmHg  Pulse 80  Temp(Src) 97.3 F (36.3 C) (Oral)  Resp 12  Wt 178 lb (80.74 kg)  SpO2 94% Body mass index is 28.95  kg/(m^2). Wt Readings from Last 3 Encounters:  01/04/16 178 lb (80.74 kg)   09/30/15 167 lb 12 oz (76.091 kg)  09/29/15 167 lb 6.4 oz (75.932 kg)   Constitutional: normal weight, in NAD Eyes: PERRLA, EOMI ENT: moist mucous membranes, + slight thyromegaly, no cervical lymphadenopathy Cardiovascular: RRR, No MRG Respiratory: CTA B Gastrointestinal: abdomen soft, NT, ND, BS+ Musculoskeletal: no deformities, strength intact in all 4 Skin: moist, warm, no rashes Neurological: + tremor with outstretched hands, DTR normal in all 4  ASSESSMENT: 1. Graves ds.  PLAN:  1. Patient with h/o thyrotoxicosis, with high TSI >> likely Graves ds (mild), initially with thyrotoxic sxs: weight loss, heat intolerance, hyperdefecation, palpitations, anxiety. Now with insomnia, weight gain, and hair loss over the Holidays. She is doing well on MMI low dose >> TFTs normalized in 11/2015 >> we decreased dose to 2.5 mg daily (take 5 mg every other day) >> will continue for now, pending new labs. - will check the TSH, fT3 and fT4 today - at last visit, she had tachycardia, tremors and insomnia >> we added low dose of atenolol, 25 mg daily. Will refill if TFTs abnormal, as the above signs and sxs have resolved and her pulse today is 80s. - RTC in 6 months, but likely sooner for repeat labs  Needs Refills of Atenolol and MMI.  Office Visit on 01/04/2016  Component Date Value Ref Range Status  . TSH 01/04/2016 2.20  0.35 - 4.50 uIU/mL Final  . Free T4 01/04/2016 0.79  0.60 - 1.60 ng/dL Final   TFTs normal. Will continue same MMI dose and recheck TFTs in 3 months. No need for continuing Atenolol.

## 2016-01-05 MED ORDER — METHIMAZOLE 5 MG PO TABS: 2.5000 mg | ORAL_TABLET | Freq: Every day | ORAL | Status: DC

## 2016-01-20 ENCOUNTER — Telehealth: Payer: Self-pay | Admitting: Internal Medicine

## 2016-01-20 DIAGNOSIS — E0591 Thyrotoxicosis, unspecified with thyrotoxic crisis or storm: Secondary | ICD-10-CM

## 2016-01-20 NOTE — Telephone Encounter (Signed)
Please read message below and advise.  

## 2016-01-20 NOTE — Telephone Encounter (Signed)
For the last 2 days the BP has been 142/70 please advise if you think she needs meds

## 2016-01-20 NOTE — Telephone Encounter (Signed)
Please check with PCP about this. Her TFTs have been normal at last check this month, so this is unlikely a thyroid issue.

## 2016-01-23 MED ORDER — ATENOLOL 25 MG PO TABS: 25.0000 mg | ORAL_TABLET | Freq: Every day | ORAL | Status: DC

## 2016-01-23 NOTE — Telephone Encounter (Signed)
Called pt and she said her b/p has come down, but if she quits her b/p med her b/p goes back up. Pt said her b/p was 122/70 yesterday. She asked if we would refill her b/p med? She does not see her PCP until March.

## 2016-01-23 NOTE — Addendum Note (Signed)
Addended by: Rockie Neighbours B on: 01/23/2016 05:36 PM   Modules accepted: Orders

## 2016-01-23 NOTE — Telephone Encounter (Signed)
If she is talking about Atenolol, yes, we can refill for 3 months until evaluated by PCP.

## 2016-01-23 NOTE — Telephone Encounter (Signed)
Refill sent to pt's pharmacy. 

## 2016-01-24 DIAGNOSIS — S60212A Contusion of left wrist, initial encounter: Secondary | ICD-10-CM | POA: Diagnosis not present

## 2016-02-04 ENCOUNTER — Other Ambulatory Visit (HOSPITAL_COMMUNITY): Payer: Self-pay | Admitting: Psychiatry

## 2016-02-21 DIAGNOSIS — M11232 Other chondrocalcinosis, left wrist: Secondary | ICD-10-CM | POA: Diagnosis not present

## 2016-02-21 DIAGNOSIS — S60212D Contusion of left wrist, subsequent encounter: Secondary | ICD-10-CM | POA: Diagnosis not present

## 2016-03-05 DIAGNOSIS — L851 Acquired keratosis [keratoderma] palmaris et plantaris: Secondary | ICD-10-CM | POA: Diagnosis not present

## 2016-03-15 ENCOUNTER — Ambulatory Visit: Payer: Medicare Other | Admitting: Internal Medicine

## 2016-03-15 ENCOUNTER — Encounter: Payer: Self-pay | Admitting: Internal Medicine

## 2016-03-15 ENCOUNTER — Ambulatory Visit (INDEPENDENT_AMBULATORY_CARE_PROVIDER_SITE_OTHER): Payer: Medicare Other | Admitting: Internal Medicine

## 2016-03-15 VITALS — BP 120/68 | HR 94 | Temp 97.8°F | Resp 18 | Wt 173.0 lb

## 2016-03-15 DIAGNOSIS — K219 Gastro-esophageal reflux disease without esophagitis: Secondary | ICD-10-CM | POA: Diagnosis not present

## 2016-03-15 DIAGNOSIS — E0500 Thyrotoxicosis with diffuse goiter without thyrotoxic crisis or storm: Secondary | ICD-10-CM | POA: Diagnosis not present

## 2016-03-15 DIAGNOSIS — F32A Depression, unspecified: Secondary | ICD-10-CM

## 2016-03-15 DIAGNOSIS — T17308A Unspecified foreign body in larynx causing other injury, initial encounter: Secondary | ICD-10-CM

## 2016-03-15 DIAGNOSIS — Z0000 Encounter for general adult medical examination without abnormal findings: Secondary | ICD-10-CM

## 2016-03-15 DIAGNOSIS — F064 Anxiety disorder due to known physiological condition: Secondary | ICD-10-CM | POA: Diagnosis not present

## 2016-03-15 DIAGNOSIS — F329 Major depressive disorder, single episode, unspecified: Secondary | ICD-10-CM | POA: Diagnosis not present

## 2016-03-15 MED ORDER — RANITIDINE HCL 150 MG PO TABS: 150.0000 mg | ORAL_TABLET | Freq: Every day | ORAL | Status: DC

## 2016-03-15 NOTE — Patient Instructions (Signed)
  Faith Edwards , Thank you for taking time to come for your Medicare Wellness Visit. I appreciate your ongoing commitment to your health goals. Please review the following plan we discussed and let me know if I can assist you in the future.   These are the goals we discussed: Goals    None      This is a list of the screening recommended for you and due dates:  Health Maintenance  Topic Date Due  . Flu Shot  07/31/2016  . Tetanus Vaccine  10/10/2024  . DEXA scan (bone density measurement)  Completed  . Shingles Vaccine  Completed  . Pneumonia vaccines  Completed     Test(s) ordered today. Your results will be released to [facility] (or called to you) after review, usually within 72hours after test completion. If any changes need to be made, you will be notified at that same time.  All other Health Maintenance issues reviewed.   All recommended immunizations and age-appropriate screenings are up-to-date.  No immunizations administered today.   Medications reviewed and updated.  Changes include adding zantac at night for your heartburn.    A referral was ordered for GI  Please followup annually

## 2016-03-15 NOTE — Assessment & Plan Note (Signed)
Intermittent Possibly related to GERD Will start GERD medicine and refer to GI for possible EGD

## 2016-03-15 NOTE — Assessment & Plan Note (Signed)
Following with endocrine

## 2016-03-15 NOTE — Progress Notes (Signed)
Pre visit review using our clinic review tool, if applicable. No additional management support is needed unless otherwise documented below in the visit note. 

## 2016-03-15 NOTE — Assessment & Plan Note (Signed)
Well controlled Stable Following with psychiatry management per psych

## 2016-03-15 NOTE — Assessment & Plan Note (Signed)
Frequent GERD at night Start zantac QHS Discussed concerns with uncontrolled GERD May be related to choking Referred to GI

## 2016-03-15 NOTE — Progress Notes (Signed)
Subjective:    Patient ID: Faith Edwards, female    DOB: [birth year], 82 y.o.   MRN: [id number]  HPI Here for medicare wellness.   I have personally reviewed and have noted 1.The patient's medical and social history 2.Their use of alcohol, tobacco or illicit drugs 3.Their current medications and supplements 4.The patient's functional ability including ADL's, fall risks, home safety risks and                 hearing or visual impairment. 5.Diet and physical activities 6.Evidence for depression or mood disorders  Seeing orthopedics now, seeing kidney doctor, endocrine.  Are there smokers in your home (other than you)? No  Risk Factors Exercise: walks dog, typically goes gym Dietary issues discussed: no meat, occasionally fish and chicken, veges, grains, fruit  Cardiac risk factors: advanced age,  hypertension,   Depression Screen  Have you felt down, depressed or hopeless? No, she has depression, but it is controlled  Have you felt little interest or pleasure in doing things?  No  Activities of Daily Living In your present state of health, do you have any difficulty performing the following activities?:  Driving? no Managing money?  no Feeding yourself? no Getting from bed to chair? no Climbing a flight of stairs? no Preparing food and eating?: no Bathing or showering? no Getting dressed: no Getting to/using the toilet? no Moving around from place to place: no In the past year have you fallen or had a near fall?: Yes - tripped over a root when walking her dog, seeing orthopedics for wrist injury   Are you sexually active?  No  Do you have more than one partner?  N/A  Hearing Difficulties:  Yes, mild Do you often ask people to speak up or repeat themselves? No Do you experience ringing or noises in your ears? Yes, chornic Do you have difficulty understanding soft or whispered voices? yes Vision:              Any  change in vision:  No              Up to date with eye exam:  Last exam two years ago  Memory:  Do you feel that you have a problem with memory? Yes, recalling names  Do you often misplace items? No  Do you feel safe at home?  Yes  Cognitive Testing  Alert, Orientated? Yes  Normal Appearance? Yes  Recall of three objects?  Yes  Can perform simple calculations? Yes  Displays appropriate judgment? Yes  Can read the correct time from a watch face? Yes   Advanced Directives have been discussed with the patient? Yes  Medications and allergies reviewed with patient and updated if appropriate.  Patient Active Problem List   Diagnosis Date Noted  . Graves disease 01/04/2016  . Osteopenia, severe 10/14/2015  . Ear pain 09/30/2015  . Elevated TSH 09/12/2015  . Encounter to discuss test results 09/12/2015  . Rectal bleeding 09/01/2015  . Other constipation 09/01/2015  . Incisional hernia 07/08/2015  . Ventral hernia without obstruction or gangrene 05/12/2015  . Lower back pain 05/12/2015  . Routine general medical examination at a health care facility 03/17/2015  . Spinal stenosis in cervical region 11/26/2013  . Depression 08/05/2008  . Personal history of venous thrombosis and embolism 08/05/2008  . Chronic rhinitis 12/29/2007  . Asthma 12/29/2007    Current Outpatient Prescriptions on File Prior to Visit  Medication Sig Dispense Refill  . albuterol (  PROVENTIL HFA;VENTOLIN HFA) 108 (90 BASE) MCG/ACT inhaler Inhale 2 puffs into the lungs every 6 (six) hours as needed for wheezing or shortness of breath. 1 Inhaler 0  . Aspirin-Acetaminophen-Caffeine (EXCEDRIN PO) Take 1 tablet by mouth every 6 (six) hours as needed (for migraines).     Marland Kitchen atenolol (TENORMIN) 25 MG tablet Take 1 tablet (25 mg total) by mouth daily. 30 tablet 2  . cholecalciferol (VITAMIN D) 1000 UNITS tablet Take 1,000 Units by mouth daily.    . Cyanocobalamin 1000 MCG/15ML LIQD Take 2,000 mcg by mouth every morning.      . DULoxetine (CYMBALTA) 30 MG capsule Take 30 mg by mouth daily. Take along with the 60 mg    . DULoxetine (CYMBALTA) 60 MG capsule Take 60 mg by mouth daily.    Marland Kitchen LORazepam (ATIVAN) 1 MG tablet Take 0.5-1 mg by mouth 2 (two) times daily as needed for anxiety or sleep.     . methimazole (TAPAZOLE) 5 MG tablet Take 0.5 tablets (2.5 mg total) by mouth daily. 45 tablet 2  . [DISCONTINUED] Fluticasone-Salmeterol (ADVAIR) 100-50 MCG/DOSE AEPB Inhale 1 puff into the lungs 2 (two) times daily.     No current facility-administered medications on file prior to visit.    Past Medical History  Diagnosis Date  . Pulmonary embolism ([place]) 1993    on HRT  . Asthma   . Hx of blood clots 2007 & 2010    post TKR  . Chronic headaches     [facility]  . Trauma     left lower leg 10/22/2014   . Pneumonia     hx of 1952  . Bronchitis     hx of   . Shortness of breath dyspnea   . Depression   . Anxiety   . Cystitis     1954  . Urinary frequency   . Arthritis   . Skin cancer     basal cell   . History of blood transfusion     2010  . Spinal stenosis   . Shaking     hands bilat  . Ear pain     bilat   . Pelvic fracture ([facility])     hx of in three places secondary to fall   . Right wrist fracture     hx of 1995 has plate secondary to fall     Past Surgical History  Procedure Laterality Date  . 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 Cholecystectomy    . Colonoscopy      diverticulosis  . Esi   11/2013     C7-T1 ; Dr Nelva Bush  . Breast enhancement surgery  1985  . Sp facet injection  01/19/14    RC2-3,3-4,C4-5  . Abdominal hysterectomy    . Tonsillectomy    . Eye surgery      bilat cataract surgery   . Ventral hernia repair N/A 07/08/2015    Procedure: VENTRAL ADULT REPAIR VENTRAL INCISIONAL HERNIA REPAIR;  Surgeon: Armandina Gemma, MD;  Location: [facility];  Service: General;  Laterality: N/A;  . Insertion of mesh N/A 07/08/2015    Procedure: INSERTION OF MESH;  Surgeon: Armandina Gemma, MD;  Location: [facility];  Service: General;  Laterality: N/A;    Social History   Social History  . Marital Status: Married    Spouse Name: N/A  . Number of Children: 3  . Years of Education: N/A   Occupational History  . retired  Social History Main Topics  . Smoking status: Never Smoker   . Smokeless tobacco: Never Used  . Alcohol Use: Yes     Comment: 2-3 glasses of wine weekly   . Drug Use: No  . Sexual Activity: Not Asked   Other Topics Concern  . None   Social History Narrative    Family History  Problem Relation Age of Onset  . Allergies Brother   . Asthma Brother   . Heart disease Brother   . Clotting disorder Father     PTE X 2  . Skin cancer Father   . Skin cancer Mother   . Sudden death      2 M aunts & 2 M uncles in 50s  . Heart attack Brother     > 55  . Stroke Neg Hx   . Diabetes Daughter   . Diabetes Son   . Mental illness Son     suicide    Review of Systems  Constitutional: Negative for fever, chills, appetite change and fatigue.  HENT: Positive for hearing loss (mild), tinnitus and trouble swallowing (associated with chocking, copule of times a week, even with drinking water).   Eyes: Negative for visual disturbance.  Respiratory: Positive for cough (mild, related to asthma), choking (with eating) and wheezing (mild, related to asthma). Negative for shortness of breath.   Cardiovascular: Positive for palpitations. Negative for chest pain and leg swelling.  Gastrointestinal: Positive for constipation. Negative for abdominal pain, diarrhea and blood in stool.       Gerd daily for a week or two then goes away  Genitourinary: Negative for dysuria and hematuria.       Incontinence  Musculoskeletal: Positive for arthralgias.  Neurological: Positive for dizziness, light-headedness and headaches (migraines).  Psychiatric/Behavioral: Positive for sleep disturbance and dysphoric mood (controlled). The patient is not nervous/anxious.         Objective:   Filed Vitals:   03/15/16 1013  BP: 120/68  Pulse: 94  Temp: 97.8 F (36.6 C)  Resp: 18   Filed Weights   03/15/16 1013  Weight: 173 lb (78.472 kg)   Body mass index is 28.14 kg/(m^2).   Physical Exam Constitutional: She appears well-developed and well-nourished. No distress.  HENT:  Head: Normocephalic and atraumatic.  Right Ear: External ear normal. Normal ear canal and TM Left Ear: External ear normal.  Normal ear canal and TM Mouth/Throat: Oropharynx is clear and moist.  Normal bilateral ear canals and tympanic membranes  Eyes: Conjunctivae and EOM are normal.  Neck: Neck supple. No tracheal deviation present. No thyromegaly present.  No carotid bruit  Cardiovascular: Normal rate, regular rhythm and normal heart sounds.   No murmur heard.  No edema. Pulmonary/Chest: Effort normal and breath sounds normal. No respiratory distress. She has no wheezes. She has no rales.  Breast: deferred to Gyn Abdominal: Soft. She exhibits no distension. There is no tenderness.  Lymphadenopathy: She has no cervical adenopathy.  Skin: Skin is warm and dry. She is not diaphoretic.  Psychiatric: She has a normal mood and affect. Her behavior is normal.         Assessment & Plan:   Wellness Exam Immunizations  Up to date EKG last done 2016 Colonoscopy  Up to date  Mammogram - last done 2-3 years dexa Up to date  43 - no following Eye exam - due Hearing loss - yes, mild Memory concerns/difficulties - none, except name recall Independent of ADLs - yes  See Problem  List for Assessment and Plan of chronic medical problems.  Follow up annually

## 2016-03-20 DIAGNOSIS — S60212D Contusion of left wrist, subsequent encounter: Secondary | ICD-10-CM | POA: Diagnosis not present

## 2016-03-22 ENCOUNTER — Other Ambulatory Visit (INDEPENDENT_AMBULATORY_CARE_PROVIDER_SITE_OTHER): Payer: Medicare Other

## 2016-03-22 ENCOUNTER — Encounter: Payer: Self-pay | Admitting: Internal Medicine

## 2016-03-22 DIAGNOSIS — F32A Depression, unspecified: Secondary | ICD-10-CM

## 2016-03-22 DIAGNOSIS — F329 Major depressive disorder, single episode, unspecified: Secondary | ICD-10-CM | POA: Diagnosis not present

## 2016-03-22 DIAGNOSIS — E0500 Thyrotoxicosis with diffuse goiter without thyrotoxic crisis or storm: Secondary | ICD-10-CM | POA: Diagnosis not present

## 2016-03-22 DIAGNOSIS — E785 Hyperlipidemia, unspecified: Secondary | ICD-10-CM | POA: Insufficient documentation

## 2016-03-22 LAB — CBC WITH DIFFERENTIAL/PLATELET
BASOS ABS: 0 10*3/uL (ref 0.0–0.1)
Basophils Relative: 0.5 % (ref 0.0–3.0)
EOS PCT: 1.9 % (ref 0.0–5.0)
Eosinophils Absolute: 0.2 10*3/uL (ref 0.0–0.7)
HEMATOCRIT: 41.2 % (ref 36.0–46.0)
Hemoglobin: 13.7 g/dL (ref 12.0–15.0)
LYMPHS ABS: 2.7 10*3/uL (ref 0.7–4.0)
LYMPHS PCT: 34.2 % (ref 12.0–46.0)
MCHC: 33.3 g/dL (ref 30.0–36.0)
MCV: 93.1 fl (ref 78.0–100.0)
MONOS PCT: 9.4 % (ref 3.0–12.0)
Monocytes Absolute: 0.7 10*3/uL (ref 0.1–1.0)
NEUTROS ABS: 4.3 10*3/uL (ref 1.4–7.7)
NEUTROS PCT: 54 % (ref 43.0–77.0)
PLATELETS: 375 10*3/uL (ref 150.0–400.0)
RBC: 4.42 Mil/uL (ref 3.87–5.11)
RDW: 13.2 % (ref 11.5–15.5)
WBC: 7.9 10*3/uL (ref 4.0–10.5)

## 2016-03-22 LAB — LIPID PANEL
CHOL/HDL RATIO: 3
Cholesterol: 231 mg/dL — ABNORMAL HIGH (ref 0–200)
HDL: 66 mg/dL (ref >39.00)
LDL Cholesterol: 152 mg/dL — ABNORMAL HIGH (ref 0–99)
NONHDL: 164.83
Triglycerides: 64 mg/dL (ref 0.0–149.0)
VLDL: 12.8 mg/dL (ref 0.0–40.0)

## 2016-03-22 LAB — COMPREHENSIVE METABOLIC PANEL
ALT: 13 U/L (ref 0–35)
AST: 18 U/L (ref 0–37)
Albumin: 4.1 g/dL (ref 3.5–5.2)
Alkaline Phosphatase: 122 U/L — ABNORMAL HIGH (ref 39–117)
BUN: 26 mg/dL — ABNORMAL HIGH (ref 6–23)
CALCIUM: 9.6 mg/dL (ref 8.4–10.5)
CHLORIDE: 105 meq/L (ref 96–112)
CO2: 27 meq/L (ref 19–32)
Creatinine, Ser: 1.04 mg/dL (ref 0.40–1.20)
GFR: 53.9 mL/min — AB (ref >60.00)
GLUCOSE: 103 mg/dL — AB (ref 70–99)
POTASSIUM: 4.7 meq/L (ref 3.5–5.1)
Sodium: 140 mEq/L (ref 135–145)
Total Bilirubin: 0.7 mg/dL (ref 0.2–1.2)
Total Protein: 7.3 g/dL (ref 6.0–8.3)

## 2016-03-29 ENCOUNTER — Ambulatory Visit (INDEPENDENT_AMBULATORY_CARE_PROVIDER_SITE_OTHER): Payer: Medicare Other | Admitting: Physician Assistant

## 2016-03-29 ENCOUNTER — Encounter: Payer: Self-pay | Admitting: Physician Assistant

## 2016-03-29 VITALS — BP 110/64 | HR 104 | Ht 67.0 in | Wt 173.4 lb

## 2016-03-29 DIAGNOSIS — R1310 Dysphagia, unspecified: Secondary | ICD-10-CM

## 2016-03-29 DIAGNOSIS — T17308A Unspecified foreign body in larynx causing other injury, initial encounter: Secondary | ICD-10-CM

## 2016-03-29 NOTE — Progress Notes (Signed)
Reviewed and agree with initial management plan.  Tavaras Goody T. Atiyana Welte, MD FACG 

## 2016-03-29 NOTE — Progress Notes (Signed)
Patient ID: Faith Edwards, female   DOB: 02-Apr-1932, 82 y.o.   MRN: [id number]   Subjective:    Patient ID: Faith Edwards, female    DOB: 10-Sep-1932, 82 y.o.   MRN: [id number]  HPI  Faith Edwards 's a pleasant 82 year old white female known to Dr. Fuller Plan. She had undergone colonoscopy in September 2016 with finding of sigmoid diverticulosis and grade 1 internal hemorrhoids. She has not had prior EGD. She does have prior history of DVTs and pulmonary emboli, has recently been diagnosed with Graves' disease. She also underwent a ventral hernia repair in July 2016. She comes in today with complaints of choking episodes. She says she has had a lot of coughing over the past few months which she thinks may be attributable to her hyperthyroidism. She has also noticed that she's been having more frequent episodes of coughing and choking with her meals. She has developed some nocturnal heartburn and indigestion over the past 6 months. She describes dysphagia to liquids which seems to be very intermittent and associated with coughing and sometimes strangling. She has also had episodes of dysphagia to solids with coughing and choking. When asked whether she feels her food is sitting in her esophagus for a prolonged period of time she feels that it is again intermittently. Appetite is been fine and weight has been stable. She has recently started on ranitidine at bedtime and has noticed some improvement in her heartburn.  Review of Systems Pertinent positive and negative review of systems were noted in the above HPI section.  All other review of systems was otherwise negative.  Outpatient Encounter Prescriptions as of 03/29/2016  Medication Sig  . albuterol (PROVENTIL HFA;VENTOLIN HFA) 108 (90 BASE) MCG/ACT inhaler Inhale 2 puffs into the lungs every 6 (six) hours as needed for wheezing or shortness of breath.  . Aspirin-Acetaminophen-Caffeine (EXCEDRIN PO) Take 1 tablet by mouth every 6 (six) hours as needed (for  migraines).   . cholecalciferol (VITAMIN D) 1000 UNITS tablet Take 1,000 Units by mouth daily.  . Cyanocobalamin 1000 MCG/15ML LIQD Take 2,000 mcg by mouth every morning.   . DULoxetine (CYMBALTA) 60 MG capsule Take 60 mg by mouth daily.  Marland Kitchen LORazepam (ATIVAN) 1 MG tablet Take 0.5-1 mg by mouth 2 (two) times daily as needed for anxiety or sleep.   . ranitidine (ZANTAC) 150 MG tablet Take 1 tablet (150 mg total) by mouth at bedtime.  Marland Kitchen atenolol (TENORMIN) 25 MG tablet Take 1 tablet (25 mg total) by mouth daily. (Patient not taking: Reported on 03/29/2016)  . methimazole (TAPAZOLE) 5 MG tablet Take 0.5 tablets (2.5 mg total) by mouth daily. (Patient not taking: Reported on 03/29/2016)  . [DISCONTINUED] DULoxetine (CYMBALTA) 30 MG capsule Take 30 mg by mouth daily. Take along with the 60 mg   No facility-administered encounter medications on file as of 03/29/2016.   Allergies  Allergen Reactions  . Penicillins Hives  . Sulfa Antibiotics Hives and Nausea And Vomiting    7-15   Patient Active Problem List   Diagnosis Date Noted  . Hyperlipidemia 03/22/2016  . Choking 03/15/2016  . GERD (gastroesophageal reflux disease) 03/15/2016  . Graves disease 01/04/2016  . Osteopenia, severe 10/14/2015  . Rectal bleeding 09/01/2015  . Other constipation 09/01/2015  . Incisional hernia 07/08/2015  . Ventral hernia without obstruction or gangrene 05/12/2015  . Lower back pain 05/12/2015  . Spinal stenosis in cervical region 11/26/2013  . Depression 08/05/2008  . Personal history of venous thrombosis and embolism 08/05/2008  .  Chronic rhinitis 12/29/2007  . Asthma 12/29/2007   Social History   Social History  . Marital Status: Married    Spouse Name: N/A  . Number of Children: 3  . Years of Education: N/A   Occupational History  . retired    Social History Main Topics  . Smoking status: Never Smoker   . Smokeless tobacco: Never Used  . Alcohol Use: Yes     Comment: minimal  . Drug Use: No   . Sexual Activity: Not on file   Other Topics Concern  . Not on file   Social History Narrative    [name]'s family history includes Allergies in her brother; Asthma in her brother; Clotting disorder in her father; Diabetes in her daughter and son; Heart attack in her brother; Heart disease in her brother; Mental illness in her son; Skin cancer in her father and mother. There is no history of Stroke.      Objective:    Filed Vitals:   03/29/16 0903  BP: 110/64  Pulse: 104    Physical Exam well-developed elderly white female in no acute distress, appears younger than stated age blood pressure 110/64 pulse 104 height 5 foot 7 weight 173. HEENT; nontraumatic normocephalic EOMI PERRLA sclera anicteric neck is supple there's no thyromegaly palpable and nontender, Cardiovascular; regular rate and rhythm with S1-S2 no murmur or gallop, Pulmonary; clear bilaterally, Abdomen ;soft nontender nondistended bowel sounds are active there is no palpable mass or hepatosplenomegaly, Rectal; exam not done, Neuropsych; mood and affect appropriate     Assessment & Plan:   #37 82 year old white female with 6 month history of intermittent solid and liquid dysphagia associated with coughing and choking. It is not clear whether this is an esophageal issue but we will rule out motility disorder, occult stricture. She may be having intermittent episodes of aspiration. #2 Graves' disease #3 history of diverticulosis #4 status post ventral hernia repair July 2016  Plan; continue ranitidine 150 mg by mouth daily at bedtime Will schedule for barium swallow with tablet as initial exam, then consider speech path eval versus EGD depending on results. Discussed importance of mindful swallowing of liquids and solids, to drink slowly and not go up, chew food carefully and not talking while eating.  Amy S Esterwood PA-C 03/29/2016   Cc: Binnie Rail, MD

## 2016-03-29 NOTE — Patient Instructions (Addendum)
  You have been scheduled for a Barium Esophogram at [facility] (1st floor of the hospital) on Friday 4- 04-2016 at 11:30  am . Please arrive at 11:15 am  to your appointment for registration. Make certain not to have anything to eat or drink 3 hours prior to your test. If you need to reschedule for any reason, please contact radiology at [phone] to do so.  Continue the Zantac 150 mg, take 1 tablet at bedtime.  __________________________________________________________________ A barium swallow is an examination that concentrates on views of the esophagus. This tends to be a double contrast exam (barium and two liquids which, when combined, create a gas to distend the wall of the oesophagus) or single contrast (non-ionic iodine based). The study is usually tailored to your symptoms so a good history is essential. Attention is paid during the study to the form, structure and configuration of the esophagus, looking for functional disorders (such as aspiration, dysphagia, achalasia, motility and reflux) EXAMINATION You may be asked to change into a gown, depending on the type of swallow being performed. A radiologist and radiographer will perform the procedure. The radiologist will advise you of the type of contrast selected for your procedure and direct you during the exam. You will be asked to stand, sit or lie in several different positions and to hold a small amount of fluid in your mouth before being asked to swallow while the imaging is performed .In some instances you may be asked to swallow barium coated marshmallows to assess the motility of a solid food bolus. The exam can be recorded as a digital or video fluoroscopy procedure. POST PROCEDURE It will take 1-2 days for the barium to pass through your system. To facilitate this, it is important, unless otherwise directed, to increase your fluids for the next 24-48hrs and to resume your normal diet.  This test typically takes about 30  minutes to perform. __________________________________________________________________________________           Normal BMI (Body Mass Index- based on height and weight) is between 23 and 30. Your BMI today is Body mass index is 27.15 kg/(m^2). Marland Kitchen

## 2016-04-04 ENCOUNTER — Ambulatory Visit (HOSPITAL_COMMUNITY)
Admission: RE | Admit: 2016-04-04 | Discharge: 2016-04-04 | Disposition: A | Discharge status: Home / Self Care | Payer: Medicare Other | Source: Ambulatory Visit | Attending: Physician Assistant | Admitting: Physician Assistant

## 2016-04-04 DIAGNOSIS — K229 Disease of esophagus, unspecified: Secondary | ICD-10-CM | POA: Diagnosis not present

## 2016-04-04 DIAGNOSIS — R1310 Dysphagia, unspecified: Secondary | ICD-10-CM | POA: Diagnosis not present

## 2016-04-04 DIAGNOSIS — K449 Diaphragmatic hernia without obstruction or gangrene: Secondary | ICD-10-CM | POA: Insufficient documentation

## 2016-04-06 ENCOUNTER — Ambulatory Visit (HOSPITAL_COMMUNITY): Payer: Medicare Other

## 2016-04-06 ENCOUNTER — Ambulatory Visit (INDEPENDENT_AMBULATORY_CARE_PROVIDER_SITE_OTHER): Payer: Medicare Other | Admitting: Internal Medicine

## 2016-04-06 ENCOUNTER — Telehealth: Payer: Self-pay | Admitting: Internal Medicine

## 2016-04-06 ENCOUNTER — Encounter: Payer: Self-pay | Admitting: Internal Medicine

## 2016-04-06 VITALS — BP 114/70 | HR 88 | Temp 98.1°F | Resp 12 | Wt 177.0 lb

## 2016-04-06 DIAGNOSIS — E0500 Thyrotoxicosis with diffuse goiter without thyrotoxic crisis or storm: Secondary | ICD-10-CM

## 2016-04-06 DIAGNOSIS — M19011 Primary osteoarthritis, right shoulder: Secondary | ICD-10-CM | POA: Diagnosis not present

## 2016-04-06 LAB — TSH: TSH: 3.08 u[IU]/mL (ref 0.35–4.50)

## 2016-04-06 LAB — T4, FREE: Free T4: 0.77 ng/dL (ref 0.60–1.60)

## 2016-04-06 LAB — T3, FREE: T3 FREE: 3.3 pg/mL (ref 2.3–4.2)

## 2016-04-06 NOTE — Telephone Encounter (Signed)
Pt stated she was returning your call

## 2016-04-06 NOTE — Patient Instructions (Addendum)
Please continue off Methimazole.  Please stop at the lab.  Please come back for a follow-up appointment in 6 months.

## 2016-04-06 NOTE — Progress Notes (Signed)
Patient ID: Faith Edwards, female   DOB: Apr 09, 1932, 82 y.o.   MRN: [id number]   HPI  Faith Edwards is a 82 y.o.-year-old female, returning for f/u for thyrotoxicosis, likely Graves ds. Last visit 3 mo ago.   I reviewed pt's thyroid tests: Lab Results  Component Value Date   TSH 2.20 01/04/2016   TSH 3.09 11/16/2015   TSH 0.04* 09/29/2015   TSH 0.02* 09/19/2015   TSH 0.04* 09/12/2015   TSH 4.09 01/07/2014   TSH 3.54 05/07/2012   TSH 3.89 03/16/2011   TSH 3.77 09/05/2010   TSH 3.57 01/11/2010   FREET4 0.79 01/04/2016   FREET4 0.62 11/16/2015   FREET4 1.02 09/29/2015   FREET4 0.9 08/05/2008    Component     Latest Ref Rng 09/29/2015  TSI     <140 % baseline 531 (H)   In the light of the high TSI, we assume that she had Graves' disease (bypassed the thyroid uptake and scan) and started methimazole. In 11/2015, We decreased MMI to 5 mg qod >> she then stopped (by herself! - cannot remember when).  We tried to stop atenolol at last visit, but she developed slightly higher blood pressure and we had to restart it. She stopped it recently.   Pt denies feeling nodules in neck, + hoarseness, no dysphagia/no odynophagia, no SOB with lying down; she c/o: - + insomnia - worse - on Ativan by Dr Amalia Greenhouse - + heat intolerance - + fatigue - + hair loss - + irritability  Pt does have a FH of thyroid ds.: + mother. No FH of thyroid cancer. No h/o radiation tx to head or neck.  No seaweed or kelp, no recent contrast studies. No steroid use. No herbal supplements. No Biotin use.  ROS: Constitutional: + see HPI Eyes: no blurry vision, no xerophthalmia ENT: no sore throat, no nodules palpated in throat, no dysphagia/no odynophagia, + hoarseness Cardiovascular: no CP/no SOB/+ palpitations/no leg swelling Respiratory:no  cough/no SOB Gastrointestinal: no N/V/DC, + heartburn Musculoskeletal: + muscle aches/+ joint aches Skin: no rashes, + hair loss Neurological:no tremors/no  numbness/tingling/dizziness  I reviewed pt's medications, allergies, PMH, social hx, family hx, and changes were documented in the history of present illness. Otherwise, unchanged from my initial visit note.  Past Medical History  Diagnosis Date  . Pulmonary embolism ([place]) 1993    on HRT  . Asthma   . Hx of blood clots 2007 & 2010    post TKR  . Chronic headaches     [facility]  . Trauma     left lower leg 10/22/2014   . Pneumonia     hx of 1952  . Bronchitis     hx of   . Shortness of breath dyspnea   . Depression   . Anxiety   . Cystitis     1954  . Urinary frequency   . Arthritis   . Skin cancer     basal cell   . History of blood transfusion     2010  . Spinal stenosis   . Shaking     hands bilat  . Ear pain     bilat   . Pelvic fracture ([facility])     hx of in three places secondary to fall   . Right wrist fracture     hx of 1995 has plate secondary to fall   . Hyperthyroidism    Past Surgical History  Procedure Laterality Date  . Replacement total knee  bilateral  2007 & 2010  . Cholecystectomy    . Colonoscopy      diverticulosis  . Esi   11/2013     C7-T1 ; Dr Nelva Bush  . Breast enhancement surgery  1985  . Sp facet injection  01/19/14    RC2-3,3-4,C4-5  . Abdominal hysterectomy    . Tonsillectomy    . Eye surgery      bilat cataract surgery   . Ventral hernia repair N/A 07/08/2015    Procedure: VENTRAL ADULT REPAIR VENTRAL INCISIONAL HERNIA REPAIR;  Surgeon: Armandina Gemma, MD;  Location: [facility];  Service: General;  Laterality: N/A;  . Insertion of mesh N/A 07/08/2015    Procedure: INSERTION OF MESH;  Surgeon: Armandina Gemma, MD;  Location: [facility];  Service: General;  Laterality: N/A;   Social History   Social History  . Marital Status: Married    Spouse Name: N/A  . Number of Children: 3  . Years of Education: N/A   Occupational History  . retired    Social History Main Topics  . Smoking status: Never Smoker   . Smokeless tobacco: Never Used  .  Alcohol Use: Yes     Comment: minimal  . Drug Use: No  . Sexual Activity: Not on file   Other Topics Concern  . Not on file   Social History Narrative   Current Outpatient Prescriptions on File Prior to Visit  Medication Sig Dispense Refill  . albuterol (PROVENTIL HFA;VENTOLIN HFA) 108 (90 BASE) MCG/ACT inhaler Inhale 2 puffs into the lungs every 6 (six) hours as needed for wheezing or shortness of breath. 1 Inhaler 0  . Aspirin-Acetaminophen-Caffeine (EXCEDRIN PO) Take 1 tablet by mouth every 6 (six) hours as needed (for migraines).     Marland Kitchen atenolol (TENORMIN) 25 MG tablet Take 1 tablet (25 mg total) by mouth daily. 30 tablet 2  . cholecalciferol (VITAMIN D) 1000 UNITS tablet Take 1,000 Units by mouth daily.    . Cyanocobalamin 1000 MCG/15ML LIQD Take 2,000 mcg by mouth every morning.     . DULoxetine (CYMBALTA) 60 MG capsule Take 60 mg by mouth daily.    Marland Kitchen LORazepam (ATIVAN) 1 MG tablet Take 0.5-1 mg by mouth 2 (two) times daily as needed for anxiety or sleep.     . methimazole (TAPAZOLE) 5 MG tablet Take 0.5 tablets (2.5 mg total) by mouth daily. 45 tablet 2  . ranitidine (ZANTAC) 150 MG tablet Take 1 tablet (150 mg total) by mouth at bedtime. 90 tablet 1  . [DISCONTINUED] Fluticasone-Salmeterol (ADVAIR) 100-50 MCG/DOSE AEPB Inhale 1 puff into the lungs 2 (two) times daily.     No current facility-administered medications on file prior to visit.   Allergies  Allergen Reactions  . Penicillins Hives  . Sulfa Antibiotics Hives and Nausea And Vomiting    7-15   Family History  Problem Relation Age of Onset  . Allergies Brother   . Asthma Brother   . Heart disease Brother   . Clotting disorder Father     PTE X 2  . Skin cancer Father   . Skin cancer Mother   . Sudden death      2 M aunts & 2 M uncles in 35s  . Heart attack Brother     > 55  . Stroke Neg Hx   . Diabetes Daughter   . Diabetes Son   . Mental illness Son     suicide   PE: BP 114/70 mmHg  Pulse 88  Temp(Src) 98.1 F (36.7 C) (Oral)  Resp 12  Wt 177 lb (80.287 kg)  SpO2 95% Body mass index is 27.72 kg/(m^2). Wt Readings from Last 3 Encounters:  04/06/16 177 lb (80.287 kg)  03/29/16 173 lb 6.4 oz (78.654 kg)  03/15/16 173 lb (78.472 kg)   Constitutional: normal weight, in NAD Eyes: PERRLA, EOMI ENT: moist mucous membranes, + slight thyromegaly, no cervical lymphadenopathy Cardiovascular: RRR, No MRG Respiratory: CTA B Gastrointestinal: abdomen soft, NT, ND, BS+ Musculoskeletal: no deformities, strength intact in all 4 Skin: moist, warm, no rashes Neurological: no tremor with outstretched hands, DTR normal in all 4  ASSESSMENT: 1. Graves ds.  PLAN:  1. Patient with h/o thyrotoxicosis, with high TSI >> likely Graves ds (mild), initially with thyrotoxic sxs: weight loss, heat intolerance, hyperdefecation, palpitations, anxiety. Now with insomnia, fatigue and hair loss. She was on MMI low dose (2.5 mg daily) before last set of TFTs in 01/2016, but stopped MMI by herself around that time >> will continue off MMI for now, pending new labs - will check the TSH, fT3 and fT4 today - as she had tachycardia, tremors and insomnia in the past >> we added low dose of atenolol, 25 mg daily >> stopped at last visit, but BP increased >> I refilled her MMI until she was able to see PCP to adjust her BP med as Atenolol is not a great BP drug. She stopped Atenolol since >> BP is very good. - RTC in 6 months, but likely sooner for repeat labs   Office Visit on 04/06/2016  Component Date Value Ref Range Status  . Free T4 04/06/2016 0.77  0.60 - 1.60 ng/dL Final  . T3, Free 04/06/2016 3.3  2.3 - 4.2 pg/mL Final  . TSH 04/06/2016 3.08  0.35 - 4.50 uIU/mL Final   Normal TFTs despite stopping methimazole. We'll repeat her labs in 6 months.

## 2016-04-06 NOTE — Telephone Encounter (Signed)
Returned pt's call. Advised her of her lab results. Pt pleased.

## 2016-04-12 LAB — THYROID STIMULATING IMMUNOGLOBULIN: TSI: 524 % baseline — ABNORMAL HIGH (ref <140)

## 2016-05-02 DIAGNOSIS — M19011 Primary osteoarthritis, right shoulder: Secondary | ICD-10-CM | POA: Diagnosis not present

## 2016-05-21 ENCOUNTER — Telehealth: Payer: Self-pay | Admitting: Internal Medicine

## 2016-05-21 NOTE — Telephone Encounter (Signed)
Spoke with pt. States that she needs an urgent appointment. Pt has not been seen since 2013. States that she has acute bronchitis. Reports coughing with production of yellow mucus, sinus congestion and R sided pain. Denies chest tightness, wheezing or fever.  MW - please advise if we can use a 15 min slot for this pt. Thanks.

## 2016-05-21 NOTE — Telephone Encounter (Signed)
Pt scheduled for 05/23/16

## 2016-05-22 DIAGNOSIS — R05 Cough: Secondary | ICD-10-CM | POA: Diagnosis not present

## 2016-05-22 DIAGNOSIS — J018 Other acute sinusitis: Secondary | ICD-10-CM | POA: Diagnosis not present

## 2016-05-23 ENCOUNTER — Ambulatory Visit (INDEPENDENT_AMBULATORY_CARE_PROVIDER_SITE_OTHER): Payer: Medicare Other | Admitting: Family

## 2016-05-23 ENCOUNTER — Telehealth: Payer: Self-pay | Admitting: Emergency Medicine

## 2016-05-23 VITALS — BP 140/72 | HR 100 | Temp 98.3°F | Resp 18 | Wt 173.0 lb

## 2016-05-23 DIAGNOSIS — J4520 Mild intermittent asthma, uncomplicated: Secondary | ICD-10-CM

## 2016-05-23 DIAGNOSIS — R05 Cough: Secondary | ICD-10-CM | POA: Diagnosis not present

## 2016-05-23 DIAGNOSIS — R059 Cough, unspecified: Secondary | ICD-10-CM

## 2016-05-23 MED ORDER — ALBUTEROL SULFATE (2.5 MG/3ML) 0.083% IN NEBU
2.5000 mg | INHALATION_SOLUTION | Freq: Once | RESPIRATORY_TRACT | Status: AC
  Administered 2016-05-23: 2.5 mg via RESPIRATORY_TRACT

## 2016-05-23 MED ORDER — BENZONATATE 100 MG PO CAPS: 100.0000 mg | ORAL_CAPSULE | Freq: Three times a day (TID) | ORAL | Status: DC | PRN

## 2016-05-23 NOTE — Progress Notes (Signed)
Pre visit review using our clinic review tool, if applicable. No additional management support is needed unless otherwise documented below in the visit note. 

## 2016-05-23 NOTE — Patient Instructions (Signed)
Suspect pneumonia. Call the urgent care to see what the results of your chest xray were.   Increase intake of clear fluids. Congestion is best treated by hydration, when mucus is wetter, it is thinner, less sticky, and easier to expel from the body, either through coughing up drainage, or by blowing your nose.   Get plenty of rest.   Use saline nasal drops and blow your nose frequently. Run a humidifier at night and elevate the head of the bed. Vicks Vapor rub will help with congestion and cough. Steam showers and sinus massage for congestion.   Use Acetaminophen or Ibuprofen as needed for fever or pain. Avoid second hand smoke. Even the smallest exposure will worsen symptoms.   Over the counter medications you can try include Delsym for cough, a decongestant for congestion, and Mucinex or Robitussin as an expectorant. Be sure to just get the plain Mucinex or Robitussin that just has one medication (Guaifenesen). We don't recommend the combination products. Note, be sure to drink two glasses of water with each dose of Mucinex as the medication will not work well without adequate hydration.   You can also try a teaspoon of honey to see if this will help reduce cough. Throat lozenges can sometimes be beneficial as well.    This illness will typically last 7 - 10 days.   Please follow up with our clinic if you develop a fever greater than 101 F, symptoms worsen, or do not resolve in the next week.   Community-Acquired Pneumonia, Adult Pneumonia is an infection of the lungs. There are different types of pneumonia. One type can develop while a person is in a hospital. A different type, called community-acquired pneumonia, develops in people who are not, or have not recently been, in the hospital or other health care facility.  CAUSES Pneumonia may be caused by bacteria, viruses, or funguses. Community-acquired pneumonia is often caused by Streptococcus pneumonia bacteria. These bacteria are often  passed from one person to another by breathing in droplets from the cough or sneeze of an infected person. RISK FACTORS The condition is more likely to develop in:  People who havechronic diseases, such as chronic obstructive pulmonary disease (COPD), asthma, congestive heart failure, cystic fibrosis, diabetes, or kidney disease.  People who haveearly-stage or late-stage HIV.  People who havesickle cell disease.  People who havehad their spleen removed (splenectomy).  People who havepoor Human resources officer.  People who havemedical conditions that increase the risk of breathing in (aspirating) secretions their own mouth and nose.   People who havea weakened immune system (immunocompromised).  People who smoke.  People whotravel to areas where pneumonia-causing germs commonly exist.  People whoare around animal habitats or animals that have pneumonia-causing germs, including birds, bats, rabbits, cats, and farm animals. SYMPTOMS Symptoms of this condition include:  Adry cough.  A wet (productive) cough.  Fever.  Sweating.  Chest pain, especially when breathing deeply or coughing.  Rapid breathing or difficulty breathing.  Shortness of breath.  Shaking chills.  Fatigue.  Muscle aches. DIAGNOSIS Your health care provider will take a medical history and perform a physical exam. You may also have other tests, including:  Imaging studies of your chest, including X-rays.  Tests to check your blood oxygen level and other blood gases.  Other tests on blood, mucus (sputum), fluid around your lungs (pleural fluid), and urine. If your pneumonia is severe, other tests may be done to identify the specific cause of your illness. TREATMENT The type  of treatment that you receive depends on many factors, such as the cause of your pneumonia, the medicines you take, and other medical conditions that you have. For most adults, treatment and recovery from pneumonia may occur at  home. In some cases, treatment must happen in a hospital. Treatment may include:  Antibiotic medicines, if the pneumonia was caused by bacteria.  Antiviral medicines, if the pneumonia was caused by a virus.  Medicines that are given by mouth or through an IV tube.  Oxygen.  Respiratory therapy. Although rare, treating severe pneumonia may include:  Mechanical ventilation. This is done if you are not breathing well on your own and you cannot maintain a safe blood oxygen level.  Thoracentesis. This procedureremoves fluid around one lung or both lungs to help you breathe better. HOME CARE INSTRUCTIONS  Take over-the-counter and prescription medicines only as told by your health care provider.  Only takecough medicine if you are losing sleep. Understand that cough medicine can prevent your body's natural ability to remove mucus from your lungs.  If you were prescribed an antibiotic medicine, take it as told by your health care provider. Do not stop taking the antibiotic even if you start to feel better.  Sleep in a semi-upright position at night. Try sleeping in a reclining chair, or place a few pillows under your head.  Do not use tobacco products, including cigarettes, chewing tobacco, and e-cigarettes. If you need help quitting, ask your health care provider.  Drink enough water to keep your urine clear or pale yellow. This will help to thin out mucus secretions in your lungs. PREVENTION There are ways that you can decrease your risk of developing community-acquired pneumonia. Consider getting a pneumococcal vaccine if:  You are older than 80 years of age.  You are older than 80 years of age and are undergoing cancer treatment, have chronic lung disease, or have other medical conditions that affect your immune system. Ask your health care provider if this applies to you. There are different types and schedules of pneumococcal vaccines. Ask your health care provider which  vaccination option is best for you. You may also prevent community-acquired pneumonia if you take these actions:  Get an influenza vaccine every year. Ask your health care provider which type of influenza vaccine is best for you.  Go to the dentist on a regular basis.  Wash your hands often. Use hand sanitizer if soap and water are not available. SEEK MEDICAL CARE IF:  You have a fever.  You are losing sleep because you cannot control your cough with cough medicine. SEEK IMMEDIATE MEDICAL CARE IF:  You have worsening shortness of breath.  You have increased chest pain.  Your sickness becomes worse, especially if you are an older adult or have a weakened immune system.  You cough up blood.   This information is not intended to replace advice given to you by your health care provider. Make sure you discuss any questions you have with your health care provider.   Document Released: 12/17/2005 Document Revised: 09/07/2015 Document Reviewed: 04/13/2015 Elsevier Interactive Patient Education [employer].

## 2016-05-23 NOTE — Telephone Encounter (Signed)
Pt was in this evening to see Faith Paris, NP. Requested a referral to pulmonary. She has seen Pulm in the past but it has been 4 years. Please enter.

## 2016-05-23 NOTE — Progress Notes (Signed)
Subjective:    Patient ID: Faith Edwards, female    DOB: [birth year], 82 y.o.   MRN: [id number]   Faith Edwards is a 82 y.o. female who presents today for an acute visit.    HPI Comments: Seen at [facility] yesterday and started on zpack for PNA. Also given a IM depo medrol and had CXR done.      Cough This is a new problem. The current episode started in the past 7 days. The problem has been unchanged. The problem occurs every few hours. The cough is productive of sputum. Associated symptoms include chills, a fever and wheezing. Pertinent negatives include no chest pain, myalgias or sore throat. The symptoms are aggravated by lying down. She has tried OTC cough suppressant (azithromycin, albuterol, ) for the symptoms. The treatment provided mild relief. Her past medical history is significant for asthma, bronchitis and pneumonia. There is no history of COPD.   Past Medical History  Diagnosis Date  . Pulmonary embolism ([place]) 1993    on HRT  . Asthma   . Hx of blood clots 2007 & 2010    post TKR  . Chronic headaches     [facility]  . Trauma     left lower leg 10/22/2014   . Pneumonia     hx of 1952  . Bronchitis     hx of   . Shortness of breath dyspnea   . Depression   . Anxiety   . Cystitis     1954  . Urinary frequency   . Arthritis   . Skin cancer     basal cell   . History of blood transfusion     2010  . Spinal stenosis   . Shaking     hands bilat  . Ear pain     bilat   . Pelvic fracture ([facility])     hx of in three places secondary to fall   . Right wrist fracture     hx of 1995 has plate secondary to fall   . Hyperthyroidism    Allergies: Penicillins and Sulfa antibiotics Current Outpatient Prescriptions on File Prior to Visit  Medication Sig Dispense Refill  . albuterol (PROVENTIL HFA;VENTOLIN HFA) 108 (90 BASE) MCG/ACT inhaler Inhale 2 puffs into the lungs every 6 (six) hours as needed for wheezing or shortness of breath. 1 Inhaler 0    . Aspirin-Acetaminophen-Caffeine (EXCEDRIN PO) Take 1 tablet by mouth every 6 (six) hours as needed (for migraines).     . cholecalciferol (VITAMIN D) 1000 UNITS tablet Take 1,000 Units by mouth daily.    . Cyanocobalamin 1000 MCG/15ML LIQD Take 2,000 mcg by mouth every morning.     . DULoxetine (CYMBALTA) 60 MG capsule Take 60 mg by mouth daily.    Marland Kitchen LORazepam (ATIVAN) 1 MG tablet Take 0.5-1 mg by mouth 2 (two) times daily as needed for anxiety or sleep.     . [DISCONTINUED] Fluticasone-Salmeterol (ADVAIR) 100-50 MCG/DOSE AEPB Inhale 1 puff into the lungs 2 (two) times daily.     No current facility-administered medications on file prior to visit.    Social History  Substance Use Topics  . Smoking status: Never Smoker   . Smokeless tobacco: Never Used  . Alcohol Use: Yes     Comment: minimal    Review of Systems  Constitutional: Positive for fever and chills.  HENT: Positive for congestion. Negative for sinus pressure and sore throat.   Respiratory: Positive for  cough and wheezing.   Cardiovascular: Negative for chest pain and palpitations.  Gastrointestinal: Negative for nausea and vomiting.  Musculoskeletal: Negative for myalgias.      Objective:    BP 140/72 mmHg  Pulse 100  Temp(Src) 98.3 F (36.8 C) (Oral)  Resp 18  Wt 173 lb (78.472 kg)  SpO2 97%   Physical Exam  Constitutional: She appears well-developed and well-nourished.  HENT:  Head: Normocephalic and atraumatic.  Right Ear: Hearing, tympanic membrane, external ear and ear canal normal. No drainage, swelling or tenderness. No foreign bodies. Tympanic membrane is not erythematous and not bulging. No middle ear effusion. No decreased hearing is noted.  Left Ear: Hearing, tympanic membrane, external ear and ear canal normal. No drainage, swelling or tenderness. No foreign bodies. Tympanic membrane is not erythematous and not bulging.  No middle ear effusion. No decreased hearing is noted.  Nose: Nose normal. No  rhinorrhea. Right sinus exhibits no maxillary sinus tenderness and no frontal sinus tenderness. Left sinus exhibits no maxillary sinus tenderness and no frontal sinus tenderness.  Mouth/Throat: Uvula is midline, oropharynx is clear and moist and mucous membranes are normal. No oropharyngeal exudate, posterior oropharyngeal edema, posterior oropharyngeal erythema or tonsillar abscesses.  Eyes: Conjunctivae are normal.  Cardiovascular: Regular rhythm, normal heart sounds and normal pulses.   Pulmonary/Chest: Effort normal. She has wheezes. She has no rhonchi. She has rales in the left lower field.  Few scattered wheezes.  Lymphadenopathy:       Head (right side): No submental, no submandibular, no tonsillar, no preauricular, no posterior auricular and no occipital adenopathy present.       Head (left side): No submental, no submandibular, no tonsillar, no preauricular, no posterior auricular and no occipital adenopathy present.    She has no cervical adenopathy.  Neurological: She is alert.  Skin: Skin is warm and dry.  Psychiatric: She has a normal mood and affect. Her speech is normal and behavior is normal. Thought content normal.  Vitals reviewed.  Patient felt significantly better after albuterol treatment. Lung sounds clear and increased     Assessment & Plan:   1. Cough Working diagnosis of pneumonia. Patient had chest x-ray done yesterday and is awaiting results from urgent care. She is on appropriate medication, azithromycin, for CAP. She also has albuterol inhaler at home. She is not ill-appearing tonight. Afebrile. No acute respiratory distress.  - benzonatate (TESSALON) 100 MG capsule; Take 1 capsule (100 mg total) by mouth 3 (three) times daily as needed for cough.  Dispense: 20 capsule; Refill: 1    I have discontinued Ms. Folger's methimazole, atenolol, and ranitidine. I am also having her maintain her Aspirin-Acetaminophen-Caffeine (EXCEDRIN PO), LORazepam, Cyanocobalamin,  albuterol, cholecalciferol, and DULoxetine.   No orders of the defined types were placed in this encounter.     Start medications as prescribed and explained to patient on After Visit Summary ( AVS). Risks, benefits, and alternatives of the medications and treatment plan prescribed today were discussed, and patient expressed understanding.   Education regarding symptom management and diagnosis given to patient.   Follow-up:Plan follow-up and return precautions given if any worsening symptoms or change in condition. Advised to follow-up for pneumonia next week.   Continue to follow with Binnie Rail, MD for routine health maintenance.   Silvano Rusk Pugmire and I agreed with plan.   Mable Paris, FNP

## 2016-05-23 NOTE — Telephone Encounter (Signed)
Referral ordered

## 2016-05-30 ENCOUNTER — Encounter: Payer: Self-pay | Admitting: Internal Medicine

## 2016-05-30 ENCOUNTER — Ambulatory Visit (INDEPENDENT_AMBULATORY_CARE_PROVIDER_SITE_OTHER)
Admission: RE | Admit: 2016-05-30 | Discharge: 2016-05-30 | Disposition: A | Discharge status: Home / Self Care | Payer: Medicare Other | Source: Ambulatory Visit | Attending: Internal Medicine | Admitting: Internal Medicine

## 2016-05-30 ENCOUNTER — Ambulatory Visit (INDEPENDENT_AMBULATORY_CARE_PROVIDER_SITE_OTHER): Payer: Medicare Other | Admitting: Internal Medicine

## 2016-05-30 VITALS — BP 130/84 | HR 100 | Temp 98.1°F | Resp 16 | Wt 173.0 lb

## 2016-05-30 DIAGNOSIS — R062 Wheezing: Secondary | ICD-10-CM | POA: Diagnosis not present

## 2016-05-30 DIAGNOSIS — R0602 Shortness of breath: Secondary | ICD-10-CM | POA: Diagnosis not present

## 2016-05-30 DIAGNOSIS — J45901 Unspecified asthma with (acute) exacerbation: Secondary | ICD-10-CM | POA: Diagnosis not present

## 2016-05-30 DIAGNOSIS — R05 Cough: Secondary | ICD-10-CM | POA: Diagnosis not present

## 2016-05-30 DIAGNOSIS — R059 Cough, unspecified: Secondary | ICD-10-CM

## 2016-05-30 MED ORDER — DOXYCYCLINE HYCLATE 100 MG PO TABS: 100.0000 mg | ORAL_TABLET | Freq: Two times a day (BID) | ORAL | Status: DC

## 2016-05-30 MED ORDER — PREDNISONE 10 MG PO TABS: ORAL_TABLET | ORAL | Status: DC

## 2016-05-30 NOTE — Assessment & Plan Note (Signed)
Associated with wheeze, sob and asthma exacerbation cxr today Doxycycline bid x 10 days Prednisone taper Continue zyrtec, tessalon perles, inhaler Has an appt with Dr. Melvyn Novas tomorrow

## 2016-05-30 NOTE — Progress Notes (Signed)
Subjective:    Patient ID: Faith Edwards, female    DOB: 03-04-1932, 82 y.o.   MRN: [id number]  HPI She is here for an acute visit.   She started having cold symptoms started 5/16.  She went to urgent care on 5/23.  They did a chest xray, but they never gave her the results.  She even went there yesterday and they did not have the results.  She was prescribed a zpak and given a depo-medrol injection.  She did not have any improvement in her symptoms.  She was seen here 5/24 by Joycelyn Schmid.  She was put on tessalon perles and that has helped with her sleep.  She has not had any improvement in her symptoms.  She is taking zyrtec nightly.  She is using her inhaler.    She did have a fever for 3-4 days, but has not had one recently.  She has nasal congestion, ear pain and a plugged sensation in her ears, post nasal drip, chest tightness, productive cough, wheeze and shortness of breath.  She has had some headaches and mild diarrhea.      Medications and allergies reviewed with patient and updated if appropriate.  Patient Active Problem List   Diagnosis Date Noted  . Hyperlipidemia 03/22/2016  . Choking 03/15/2016  . GERD (gastroesophageal reflux disease) 03/15/2016  . Graves disease 01/04/2016  . Osteopenia, severe 10/14/2015  . Rectal bleeding 09/01/2015  . Other constipation 09/01/2015  . Incisional hernia 07/08/2015  . Ventral hernia without obstruction or gangrene 05/12/2015  . Lower back pain 05/12/2015  . Spinal stenosis in cervical region 11/26/2013  . Depression 08/05/2008  . Personal history of venous thrombosis and embolism 08/05/2008  . Chronic rhinitis 12/29/2007  . Asthma 12/29/2007    Current Outpatient Prescriptions on File Prior to Visit  Medication Sig Dispense Refill  . albuterol (PROVENTIL HFA;VENTOLIN HFA) 108 (90 BASE) MCG/ACT inhaler Inhale 2 puffs into the lungs every 6 (six) hours as needed for wheezing or shortness of breath. 1 Inhaler 0  .  Aspirin-Acetaminophen-Caffeine (EXCEDRIN PO) Take 1 tablet by mouth every 6 (six) hours as needed (for migraines).     . benzonatate (TESSALON) 100 MG capsule Take 1 capsule (100 mg total) by mouth 3 (three) times daily as needed for cough. 20 capsule 1  . cholecalciferol (VITAMIN D) 1000 UNITS tablet Take 1,000 Units by mouth daily.    . Cyanocobalamin 1000 MCG/15ML LIQD Take 2,000 mcg by mouth every morning.     . DULoxetine (CYMBALTA) 60 MG capsule Take 60 mg by mouth daily.    . [DISCONTINUED] Fluticasone-Salmeterol (ADVAIR) 100-50 MCG/DOSE AEPB Inhale 1 puff into the lungs 2 (two) times daily.     No current facility-administered medications on file prior to visit.    Past Medical History  Diagnosis Date  . Pulmonary embolism ([place]) 1993    on HRT  . Asthma   . Hx of blood clots 2007 & 2010    post TKR  . Chronic headaches     [facility]  . Trauma     left lower leg 10/22/2014   . Pneumonia     hx of 1952  . Bronchitis     hx of   . Shortness of breath dyspnea   . Depression   . Anxiety   . Cystitis     1954  . Urinary frequency   . Arthritis   . Skin cancer     basal cell   .  History of blood transfusion     2010  . Spinal stenosis   . Shaking     hands bilat  . Ear pain     bilat   . Pelvic fracture ([facility])     hx of in three places secondary to fall   . Right wrist fracture     hx of 1995 has plate secondary to fall   . Hyperthyroidism     Past Surgical History  Procedure Laterality Date  . 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 Cholecystectomy    . Colonoscopy      diverticulosis  . Esi   11/2013     C7-T1 ; Dr Nelva Bush  . Breast enhancement surgery  1985  . Sp facet injection  01/19/14    RC2-3,3-4,C4-5  . Abdominal hysterectomy    . Tonsillectomy    . Eye surgery      bilat cataract surgery   . Ventral hernia repair N/A 07/08/2015    Procedure: VENTRAL ADULT REPAIR VENTRAL INCISIONAL HERNIA REPAIR;  Surgeon: Armandina Gemma, MD;  Location:  [facility];  Service: General;  Laterality: N/A;  . Insertion of mesh N/A 07/08/2015    Procedure: INSERTION OF MESH;  Surgeon: Armandina Gemma, MD;  Location: [facility];  Service: General;  Laterality: N/A;    Social History   Social History  . Marital Status: Married    Spouse Name: N/A  . Number of Children: 3  . Years of Education: N/A   Occupational History  . retired    Social History Main Topics  . Smoking status: Never Smoker   . Smokeless tobacco: Never Used  . Alcohol Use: Yes     Comment: minimal  . Drug Use: No  . Sexual Activity: Not Asked   Other Topics Concern  . None   Social History Narrative    Family History  Problem Relation Age of Onset  . Allergies Brother   . Asthma Brother   . Heart disease Brother   . Clotting disorder Father     PTE X 2  . Skin cancer Father   . Skin cancer Mother   . Sudden death      2 M aunts & 2 M uncles in 43s  . Heart attack Brother     > 55  . Stroke Neg Hx   . Diabetes Daughter   . Diabetes Son   . Mental illness Son     suicide    Review of Systems  Constitutional: Negative for fever (last fever 5 days).  HENT: Positive for congestion, ear pain (plugged) and postnasal drip. Negative for sinus pressure and sore throat.   Respiratory: Positive for cough (productive, off white in color), chest tightness, shortness of breath and wheezing.   Cardiovascular: Negative for chest pain.  Gastrointestinal: Positive for diarrhea. Negative for nausea and abdominal pain.  Neurological: Positive for headaches (sinus ). Negative for dizziness and light-headedness.       Objective:   Filed Vitals:   05/30/16 1609  BP: 130/84  Pulse: 100  Temp: 98.1 F (36.7 C)  Resp: 16   Filed Weights   05/30/16 1609  Weight: 173 lb (78.472 kg)   Body mass index is 27.09 kg/(m^2).   Physical Exam GENERAL APPEARANCE: Appears stated age, well appearing, NAD EYES: conjunctiva clear, no icterus HEENT: bilateral tympanic membranes and  ear canals normal, oropharynx with mild erythema, no thyromegaly, trachea midline, no cervical or supraclavicular lymphadenopathy LUNGS: Clear  to auscultation without wheeze or crackles, unlabored breathing, good air entry bilaterally HEART: Normal S1,S2 without murmurs EXTREMITIES: Without clubbing, cyanosis, or edema      Assessment & Plan:   See Problem List for Assessment and Plan of chronic medical problems.

## 2016-05-30 NOTE — Patient Instructions (Addendum)
A Chest xray has been ordered.  We wills start an antibiotic and steroid taper.  Your prescription(s) have been submitted to your pharmacy. Please take as directed and contact our office if you believe you are having problem(s) with the medication(s).  Continue the tessalon perles, zyrtec and your inhalers.

## 2016-05-30 NOTE — Assessment & Plan Note (Signed)
cxr today Doxycycline bid x 10 days Prednisone taper Continue zyrtec, tessalon perles, inhaler Has an appt with Dr. Melvyn Novas tomorrow

## 2016-05-30 NOTE — Progress Notes (Signed)
Pre visit review using our clinic review tool, if applicable. No additional management support is needed unless otherwise documented below in the visit note. 

## 2016-05-31 ENCOUNTER — Encounter: Payer: Self-pay | Admitting: Internal Medicine

## 2016-05-31 ENCOUNTER — Ambulatory Visit (INDEPENDENT_AMBULATORY_CARE_PROVIDER_SITE_OTHER): Payer: Medicare Other | Admitting: Internal Medicine

## 2016-05-31 VITALS — BP 102/64 | HR 95 | Temp 97.8°F | Ht 67.0 in | Wt 172.4 lb

## 2016-05-31 DIAGNOSIS — R058 Other specified cough: Secondary | ICD-10-CM

## 2016-05-31 DIAGNOSIS — R05 Cough: Secondary | ICD-10-CM | POA: Diagnosis not present

## 2016-05-31 NOTE — Progress Notes (Signed)
Subjective:    Patient ID: Faith Edwards, female    DOB: 12-Jan-1932, 82 y.o.   MRN: [id number]  HPI 49 yowf never smoker prev eval 2013 for cough variant asthma better on qvar/gerd rx but did not stay on any maint rx just occasional saba with specific exposures maybe twice a month and did fine off gerd rx  until Mid may 2017 acute onset uri/sinus infection/ cough / r lat pleuritic cp   > [facility] eval rx shot/prednisone/zpak> not better so referred to pulmonary clinic 05/31/2016 by Dr Billey Gosling.    05/31/2016  Pulmonary consultation/ office visit/ Faith Edwards  Only rx = saba prn but hfa poor  Chief Complaint  Patient presents with  . Pulmonary Consult    Referred by Dr. Quay Burow; chest congestion, coughing up cloudy mucus.  chest tightness.  discuss air travel, precautions for blood clots; ribs are sore from coughing  hx as above, more coughing issues than sob/ f worse cp with coughing fits - the pain has improved despite the coughing continuing, never started back on gerd rx as prev rec  Hx indicates c/o choking and dysphagia for months prior to onset of cough  - saw GI PA 03/29/16 with [facility] pos only for [facility] and was instructed to stop H1 at this point.  No obvious other patterns in day to day or daytime variabilty or assoc  chest tightness, subjective wheeze overt sinus or hb symptoms. No unusual exp hx or h/o childhood pna/ asthma or knowledge of premature birth.  Sleeping ok without nocturnal  or early am exacerbation  of respiratory  c/o's or need for noct saba. Also denies any obvious fluctuation of symptoms with weather or environmental changes or other aggravating or alleviating factors except as outlined above   Current Medications, Allergies, Complete Past Medical History, Past Surgical History, Family History, and Social History were reviewed in [employer] record.               Review of Systems  Constitutional: Negative for fever, chills and unexpected weight  change.  HENT: Negative for congestion, dental problem, ear pain, nosebleeds, postnasal drip, rhinorrhea, sinus pressure, sneezing, sore throat, trouble swallowing and voice change.   Eyes: Negative for visual disturbance.  Respiratory: Positive for cough, chest tightness, shortness of breath and wheezing. Negative for choking.   Cardiovascular: Negative for chest pain and leg swelling.  Gastrointestinal: Negative for vomiting, abdominal pain and diarrhea.  Genitourinary: Negative for difficulty urinating.  Musculoskeletal: Negative for arthralgias.  Skin: Negative for rash.  Neurological: Negative for tremors, syncope and headaches.  Hematological: Does not bruise/bleed easily.       Objective:   Physical Exam   amb wf nad/ pseudowheeze only   Wt Readings from Last 3 Encounters:  05/31/16 172 lb 6.4 oz (78.2 kg)  05/30/16 173 lb (78.472 kg)  05/23/16 173 lb (78.472 kg)    Vital signs reviewed   HEENT: nl dentition, turbinates, and oropharynx. Nl external ear canals without cough reflex   NECK :  without JVD/Nodes/TM/ nl carotid upstrokes bilaterally   LUNGS: no acc muscle use,  Nl contour chest which is clear to A and P bilaterally without cough on insp or exp maneuvers   CV:  RRR  no s3 or murmur or increase in P2, no edema   ABD:  soft and nontender with nl inspiratory excursion in the supine position. No bruits or organomegaly, bowel sounds nl  MS:  Nl gait/ ext warm  without deformities, calf tenderness, cyanosis or clubbing No obvious joint restrictions   SKIN: warm and dry without lesions    NEURO:  alert, approp, nl sensorium with  no motor deficits      I personally reviewed images and agree with radiology impression as follows:  CXR:  05/30/16  No acute cardiopulmonary disease. COPD      Assessment & Plan:

## 2016-05-31 NOTE — Patient Instructions (Signed)
Only use your albuterol (ventolin) as a rescue medication to be used if you can't catch your breath by resting or doing a relaxed purse lip breathing pattern.  - The less you use it, the better it will work when you need it. - Ok to use up to 2 puffs  every 4 hours if you must but call for immediate appointment if use goes up over your usual need - Don't leave home without it !!  (think of it like the spare tire for your car)   Try prilosec otc 20mg   Take 30-60 min before first meal of the day and Pepcid ac (famotidine) 20 mg one @  bedtime until cough is completely gone for at least a week without the need for cough suppression    GERD (REFLUX)  is an extremely common cause of respiratory symptoms just like yours , many times with no obvious heartburn at all.    It can be treated with medication, but also with lifestyle changes including elevation of the head of your bed (ideally with 6 inch  bed blocks),  Smoking cessation, avoidance of late meals, excessive alcohol, and avoid fatty foods, chocolate, peppermint, colas, red wine, and acidic juices such as orange juice.  NO MINT OR MENTHOL PRODUCTS SO NO COUGH DROPS  USE SUGARLESS CANDY INSTEAD (Jolley ranchers or Stover's or Life Savers) or even ice chips will also do - the key is to swallow to prevent all throat clearing. NO OIL BASED VITAMINS - use powdered substitutes.  If not all better in 2 weeks return to clinic

## 2016-06-01 DIAGNOSIS — R05 Cough: Secondary | ICD-10-CM | POA: Insufficient documentation

## 2016-06-01 DIAGNOSIS — R058 Other specified cough: Secondary | ICD-10-CM | POA: Insufficient documentation

## 2016-06-01 NOTE — Assessment & Plan Note (Addendum)
The most common causes of chronic cough in immunocompetent adults include the following: upper airway cough syndrome (UACS), previously referred to as postnasal drip syndrome (PNDS), which is caused by variety of rhinosinus conditions; (2) asthma; (3) GERD; (4) chronic bronchitis from cigarette smoking or other inhaled environmental irritants; (5) nonasthmatic eosinophilic bronchitis; and (6) bronchiectasis.   These conditions, singly or in combination, have accounted for up to 94% of the causes of chronic cough in prospective studies.   Other conditions have constituted no >6% of the causes in prospective studies These have included bronchogenic carcinoma, chronic interstitial pneumonia, sarcoidosis, left ventricular failure, ACEI-induced cough, and aspiration from a condition associated with pharyngeal dysfunction.    Chronic cough is often simultaneously caused by more than one condition. A single cause has been found from 38 to 82% of the time, multiple causes from 18 to 62%. Multiply caused cough has been the result of three diseases up to 42% of the time.       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   These are the same pts (now being labeled as having "irritable larynx syndrome" by some cough centers) who not infrequently have a history of having failed to tolerate ace  inhibitors,  dry powder inhalers or biphosphonates or report having atypical reflux symptoms that don't respond to standard doses of PPI , and are easily confused as having aecopd or asthma flares by even experienced allergists/ pulmonologists.   The first step is to maximize acid suppression and eliminate cyclical coughing then regroup if the cough persists.  I had an extended discussion with the patient reviewing all relevant studies completed to date and  lasting 35 min/60 min initial eval   1) Explained:   Of the three most common causes of chronic cough, only one (GERD)  can actually cause the other two (asthma and post nasal drip syndrome)  and perpetuate the cylce of cough inducing airway trauma, inflammation, heightened sensitivity to reflux which is prompted by the cough itself via a cyclical mechanism.    This may partially respond to steroids and look like asthma and post nasal drainage but never erradicated completely unless the cough and the secondary reflux are eliminated, preferably both at the same time.  While not intuitively obvious, many patients with chronic low grade reflux do not cough until there is a secondary insult that disturbs the protective epithelial barrier and exposes sensitive nerve endings.  This can be viral or direct physical injury such as with an endotracheal tube.   The point is that once this occurs, it is difficult to eliminate using anything but a maximally effective acid suppression regimen at least in the short run, accompanied by an appropriate diet to address non acid GERD.     2)  Each maintenance medication was reviewed in detail including most  importantly the difference between maintenance and prns and under what circumstances the prns are to be triggered using an action plan format that is not reflected in the computer generated alphabetically organized AVS.    Please see instructions for details which were reviewed in writing and the patient given a  copy highlighting the part that I personally wrote and discussed at today's ov.   See instructions for specific recommendations which were reviewed directly with the patient who was given a copy with highlighter outlining the key components.

## 2016-06-02 ENCOUNTER — Encounter: Payer: Self-pay | Admitting: Family Medicine

## 2016-06-02 ENCOUNTER — Ambulatory Visit (INDEPENDENT_AMBULATORY_CARE_PROVIDER_SITE_OTHER): Payer: Medicare Other | Admitting: Family Medicine

## 2016-06-02 VITALS — BP 120/78 | HR 97 | Temp 98.2°F | Resp 20 | Wt 172.0 lb

## 2016-06-02 DIAGNOSIS — B029 Zoster without complications: Secondary | ICD-10-CM | POA: Diagnosis not present

## 2016-06-02 MED ORDER — VALACYCLOVIR HCL 1 G PO TABS: 1000.0000 mg | ORAL_TABLET | Freq: Three times a day (TID) | ORAL | Status: DC

## 2016-06-02 NOTE — Assessment & Plan Note (Signed)
D/w pt.  Valtrex, tylenol prn for pain.  F/u prn.

## 2016-06-02 NOTE — Patient Instructions (Signed)
Shingles.   Start valtrex today.   Tylenol for pain.  Take care.  Glad to see you.

## 2016-06-02 NOTE — Progress Notes (Signed)
Pre visit review using our clinic review tool, if applicable. No additional management support is needed unless otherwise documented below in the visit note.  Dermatomal rash with stinging pain on R arm in the last 24 hours.  No FCNAVD.   Meds, vitals, and allergies reviewed.   ROS: Per HPI unless specifically indicated in ROS section   nad Dermatomal rash on R arm.  Blanching.

## 2016-06-14 DIAGNOSIS — F064 Anxiety disorder due to known physiological condition: Secondary | ICD-10-CM | POA: Diagnosis not present

## 2016-06-30 HISTORY — PX: WRIST SURGERY: SHX841

## 2016-07-10 DIAGNOSIS — M26623 Arthralgia of bilateral temporomandibular joint: Secondary | ICD-10-CM | POA: Diagnosis not present

## 2016-07-10 DIAGNOSIS — R0982 Postnasal drip: Secondary | ICD-10-CM | POA: Diagnosis not present

## 2016-07-10 DIAGNOSIS — H9209 Otalgia, unspecified ear: Secondary | ICD-10-CM | POA: Diagnosis not present

## 2016-07-26 ENCOUNTER — Emergency Department (HOSPITAL_COMMUNITY)
Admission: EM | Admit: 2016-07-26 | Discharge: 2016-07-26 | Disposition: A | Discharge status: Home / Self Care | Payer: Medicare Other | Attending: Emergency Medicine | Admitting: Emergency Medicine

## 2016-07-26 ENCOUNTER — Encounter (HOSPITAL_COMMUNITY): Payer: Self-pay | Admitting: Emergency Medicine

## 2016-07-26 ENCOUNTER — Emergency Department (HOSPITAL_COMMUNITY): Payer: Medicare Other

## 2016-07-26 DIAGNOSIS — R51 Headache: Secondary | ICD-10-CM | POA: Insufficient documentation

## 2016-07-26 DIAGNOSIS — Y999 Unspecified external cause status: Secondary | ICD-10-CM | POA: Diagnosis not present

## 2016-07-26 DIAGNOSIS — S59912A Unspecified injury of left forearm, initial encounter: Secondary | ICD-10-CM | POA: Diagnosis present

## 2016-07-26 DIAGNOSIS — Z85828 Personal history of other malignant neoplasm of skin: Secondary | ICD-10-CM | POA: Insufficient documentation

## 2016-07-26 DIAGNOSIS — Z96653 Presence of artificial knee joint, bilateral: Secondary | ICD-10-CM | POA: Diagnosis not present

## 2016-07-26 DIAGNOSIS — W19XXXA Unspecified fall, initial encounter: Secondary | ICD-10-CM

## 2016-07-26 DIAGNOSIS — Y92830 Public park as the place of occurrence of the external cause: Secondary | ICD-10-CM | POA: Insufficient documentation

## 2016-07-26 DIAGNOSIS — S098XXA Other specified injuries of head, initial encounter: Secondary | ICD-10-CM | POA: Diagnosis not present

## 2016-07-26 DIAGNOSIS — S0101XA Laceration without foreign body of scalp, initial encounter: Secondary | ICD-10-CM | POA: Diagnosis not present

## 2016-07-26 DIAGNOSIS — S0081XA Abrasion of other part of head, initial encounter: Secondary | ICD-10-CM | POA: Diagnosis not present

## 2016-07-26 DIAGNOSIS — S52502A Unspecified fracture of the lower end of left radius, initial encounter for closed fracture: Secondary | ICD-10-CM | POA: Insufficient documentation

## 2016-07-26 DIAGNOSIS — Z7982 Long term (current) use of aspirin: Secondary | ICD-10-CM | POA: Diagnosis not present

## 2016-07-26 DIAGNOSIS — Z96651 Presence of right artificial knee joint: Secondary | ICD-10-CM | POA: Diagnosis not present

## 2016-07-26 DIAGNOSIS — Z471 Aftercare following joint replacement surgery: Secondary | ICD-10-CM | POA: Diagnosis not present

## 2016-07-26 DIAGNOSIS — S0181XA Laceration without foreign body of other part of head, initial encounter: Secondary | ICD-10-CM | POA: Diagnosis not present

## 2016-07-26 DIAGNOSIS — Y9301 Activity, walking, marching and hiking: Secondary | ICD-10-CM | POA: Insufficient documentation

## 2016-07-26 DIAGNOSIS — W01198A Fall on same level from slipping, tripping and stumbling with subsequent striking against other object, initial encounter: Secondary | ICD-10-CM | POA: Insufficient documentation

## 2016-07-26 DIAGNOSIS — E039 Hypothyroidism, unspecified: Secondary | ICD-10-CM | POA: Diagnosis not present

## 2016-07-26 DIAGNOSIS — J45909 Unspecified asthma, uncomplicated: Secondary | ICD-10-CM | POA: Diagnosis not present

## 2016-07-26 DIAGNOSIS — S52592A Other fractures of lower end of left radius, initial encounter for closed fracture: Secondary | ICD-10-CM | POA: Diagnosis not present

## 2016-07-26 MED ORDER — LIDOCAINE-EPINEPHRINE (PF) 2 %-1:200000 IJ SOLN
20.0000 mL | Freq: Once | INTRAMUSCULAR | Status: AC
  Administered 2016-07-26: 20 mL via INTRADERMAL
  Filled 2016-07-26: qty 20

## 2016-07-26 MED ORDER — OXYCODONE-ACETAMINOPHEN 5-325 MG PO TABS
1.0000 | ORAL_TABLET | Freq: Once | ORAL | Status: AC
  Administered 2016-07-26: 1 via ORAL
  Filled 2016-07-26: qty 1

## 2016-07-26 NOTE — Progress Notes (Signed)
Orthopedic Tech Progress Note Patient Details:  Faith Edwards [facility] 19-Nov-1932 [id number]  Ortho Devices Type of Ortho Device: Arm sling, Sugartong splint Ortho Device/Splint Interventions: Application   [place] Pink 07/26/2016, 8:08 PM

## 2016-07-26 NOTE — ED Provider Notes (Signed)
[facility] DEPT Provider Note   CSN: [id number] Arrival date & time: 07/26/16  1656  First Provider Contact:  None       History   Chief Complaint Chief Complaint  Patient presents with  . Fall    HPI CEARRA NORWICK is a 82 y.o. female.  The history is provided by the patient.  Fall  This is a new problem. The current episode started today. The problem has been unchanged. Associated symptoms include arthralgias and headaches. Pertinent negatives include no abdominal pain, chest pain, chills, coughing, fever, neck pain, rash, sore throat or vomiting. Nothing aggravates the symptoms. She has tried nothing for the symptoms. The treatment provided no relief.    Past Medical History:  Diagnosis Date  . Anxiety   . Arthritis   . Asthma   . Bronchitis    hx of   . Chronic headaches    [facility]  . Cystitis    1954  . Depression   . Ear pain    bilat   . History of blood transfusion    2010  . Hx of blood clots 2007 & 2010   post TKR  . Hyperthyroidism   . Pelvic fracture (HCC)    hx of in three places secondary to fall   . Pneumonia    hx of 1952  . Pulmonary embolism ([place]) 1993   on HRT  . Right wrist fracture    hx of 1995 has plate secondary to fall   . Shaking    hands bilat  . Shortness of breath dyspnea   . Skin cancer    basal cell   . Spinal stenosis   . Trauma    left lower leg 10/22/2014   . Urinary frequency     Patient Active Problem List   Diagnosis Date Noted  . Herpes zoster 06/02/2016  . Upper airway cough syndrome 06/01/2016  . Asthma with exacerbation 05/30/2016  . Cough 05/30/2016  . Hyperlipidemia 03/22/2016  . Choking 03/15/2016  . GERD (gastroesophageal reflux disease) 03/15/2016  . Graves disease 01/04/2016  . Osteopenia, severe 10/14/2015  . Rectal bleeding 09/01/2015  . Other constipation 09/01/2015  . Incisional hernia 07/08/2015  . Ventral hernia without obstruction or gangrene 05/12/2015  . Lower back pain  05/12/2015  . Spinal stenosis in cervical region 11/26/2013  . Depression 08/05/2008  . Personal history of venous thrombosis and embolism 08/05/2008  . Chronic rhinitis 12/29/2007  . Asthma 12/29/2007    Past Surgical History:  Procedure Laterality Date  . ABDOMINAL HYSTERECTOMY    . BREAST ENHANCEMENT SURGERY  1985  . CHOLECYSTECTOMY    . COLONOSCOPY     diverticulosis  . ESI   11/2013    C7-T1 ; Dr Nelva Bush  . EYE SURGERY     bilat cataract surgery   . INSERTION OF MESH N/A 07/08/2015   Procedure: INSERTION OF MESH;  Surgeon: Armandina Gemma, MD;  Location: [facility];  Service: General;  Laterality: N/A;  . REPLACEMENT TOTAL KNEE BILATERAL  2007 & 2010  . SP FACET INJECTION  01/19/14   RC2-3,3-4,C4-5  . TONSILLECTOMY    . VENTRAL HERNIA REPAIR N/A 07/08/2015   Procedure: VENTRAL ADULT REPAIR VENTRAL INCISIONAL HERNIA REPAIR;  Surgeon: Armandina Gemma, MD;  Location: [facility];  Service: General;  Laterality: N/A;    OB History    No data available       Home Medications    Prior to Admission medications   Medication  Sig Start Date End Date Taking? Authorizing Provider  albuterol (PROVENTIL HFA;VENTOLIN HFA) 108 (90 BASE) MCG/ACT inhaler Inhale 2 puffs into the lungs every 6 (six) hours as needed for wheezing or shortness of breath. 03/16/15   Hoyt Koch, MD  Aspirin-Acetaminophen-Caffeine (EXCEDRIN PO) Take 1 tablet by mouth every 6 (six) hours as needed (for migraines).     Historical Provider, MD  benzonatate (TESSALON) 100 MG capsule Take 1 capsule (100 mg total) by mouth 3 (three) times daily as needed for cough. 05/23/16   Burnard Hawthorne, FNP  cholecalciferol (VITAMIN D) 1000 UNITS tablet Take 1,000 Units by mouth daily.    Historical Provider, MD  Cyanocobalamin 1000 MCG/15ML LIQD Take 2,000 mcg by mouth every morning.     Historical Provider, MD  doxycycline (VIBRA-TABS) 100 MG tablet Take 1 tablet (100 mg total) by mouth 2 (two) times daily. 05/30/16   Binnie Rail, MD    DULoxetine (CYMBALTA) 60 MG capsule Take 60 mg by mouth daily.    Historical Provider, MD  valACYclovir (VALTREX) 1000 MG tablet Take 1 tablet (1,000 mg total) by mouth 3 (three) times daily. 06/02/16   Tonia Ghent, MD    Family History Family History  Problem Relation Age of Onset  . Allergies Brother   . Asthma Brother   . Heart disease Brother   . Clotting disorder Father     PTE X 2  . Skin cancer Father   . Skin cancer Mother   . Sudden death      2 M aunts & 2 M uncles in 5s  . Heart attack Brother     > 55  . Diabetes Daughter   . Diabetes Son   . Mental illness Son     suicide  . Stroke Neg Hx     Social History Social History  Substance Use Topics  . Smoking status: Never Smoker  . Smokeless tobacco: Never Used  . Alcohol use Yes     Comment: minimal     Allergies   Penicillins and Sulfa antibiotics   Review of Systems Review of Systems  Constitutional: Negative for chills and fever.  HENT: Negative for ear pain and sore throat.   Eyes: Negative for pain and visual disturbance.  Respiratory: Negative for cough and shortness of breath.   Cardiovascular: Negative for chest pain and palpitations.  Gastrointestinal: Negative for abdominal pain and vomiting.  Genitourinary: Negative for dysuria and hematuria.  Musculoskeletal: Positive for arthralgias. Negative for back pain and neck pain.  Skin: Positive for wound (to head and knee). Negative for color change and rash.  Neurological: Positive for headaches. Negative for seizures and syncope.  All other systems reviewed and are negative.    Physical Exam Updated Vital Signs BP 149/68   Pulse 76   Temp 98.4 F (36.9 C) (Oral)   Resp 14   SpO2 96%   Physical Exam  Constitutional: She appears well-developed and well-nourished. No distress.  HENT:  Head: Normocephalic.    No hyphema, nasal septal hematoma, hemotympanum, battles sign, racoon eyes, or trismus.     Eyes: Conjunctivae are  normal.  Neck: Neck supple.  Cardiovascular: Normal rate and regular rhythm.   No murmur heard. Pulmonary/Chest: Effort normal and breath sounds normal. No respiratory distress.  Abdominal: Soft. There is no tenderness.  Musculoskeletal: She exhibits no edema.       Left wrist: She exhibits tenderness, bony tenderness and swelling. She exhibits normal range of motion,  no effusion, no crepitus, no deformity and no laceration.       Right knee: She exhibits normal range of motion and no swelling. No tenderness found.       Left hand: Decreased sensation is not present in the ulnar distribution, is not present in the medial redistribution and is not present in the radial distribution. She exhibits no finger abduction, no thumb/finger opposition and no wrist extension trouble.       Legs: Neurological: She is alert.  Skin: Skin is warm and dry.  Psychiatric: She has a normal mood and affect.  Nursing note and vitals reviewed.    ED Treatments / Results  Labs (all labs ordered are listed, but only abnormal results are displayed) Labs Reviewed - No data to display  EKG  EKG Interpretation None       Radiology Dg Wrist Complete Left  Result Date: 07/26/2016 CLINICAL DATA:  Left wrist pain for 1 day after fall. EXAM: LEFT WRIST - COMPLETE 3+ VIEW COMPARISON:  None. FINDINGS: There is a displaced/impacted fracture of the distal left radius, with probable extension to the distal articular surface. No fracture appreciated within the distal ulna. Questionable nondisplaced fracture within the mid/lower pole of the scaphoid bone. Fairly extensive calcific densities about the scaphoid bone and within the region of the triangular fibrocartilage complex suggest chondrocalcinosis and underlying CPPD. IMPRESSION: 1. Displaced/impacted fracture of the distal left radius, acute-appearing, with probable fracture extension to the distal articular surface. 2. Probable nondisplaced fracture within the mid  pole of the left scaphoid bone, of uncertain age. 3. Slight widening of the scapholunate joint space suspicious for associated ligamentous injury, again of uncertain age. 4. Extensive chondrocalcinosis at the radiocarpal joint space and TFCC suggesting underlying CPPD. Electronically Signed   By: Franki Cabot M.D.   On: 07/26/2016 17:58  Dg Knee 2 Views Right  Result Date: 07/26/2016 CLINICAL DATA:  Anterior right knee abrasions status post fall today. History of right knee replacement. EXAM: RIGHT KNEE - 1-2 VIEW COMPARISON:  None. FINDINGS: Right knee arthroplasty hardware appears intact and appropriately positioned. Surrounding osseous structures appear intact and normal in alignment. No osseous fracture line or displaced fracture fragment seen. Probable small joint effusion within the suprapatellar bursa, likely chronic. Overlying superficial soft tissues are unremarkable. IMPRESSION: No acute findings. Arthroplasty hardware appears intact and normally aligned. No evidence of acute osseous fracture or dislocation. Electronically Signed   By: Franki Cabot M.D.   On: 07/26/2016 17:53  Ct Head Wo Contrast  Result Date: 07/26/2016 CLINICAL DATA:  Frontal head abrasion. EXAM: CT HEAD WITHOUT CONTRAST CT CERVICAL SPINE WITHOUT CONTRAST TECHNIQUE: Multidetector CT imaging of the head and cervical spine was performed following the standard protocol without intravenous contrast. Multiplanar CT image reconstructions of the cervical spine were also generated. COMPARISON:  None. FINDINGS: CT HEAD FINDINGS There is mild generalized age related parenchymal atrophy with commensurate dilatation of the ventricles and sulci. Chronic small vessel ischemic changes noted within the bilateral periventricular and subcortical white matter regions. There is no mass, hemorrhage, edema or other evidence of acute parenchymal abnormality. No extra-axial hemorrhage. Soft tissue edema overlying the upper frontal bones, with  irregularities of the overlying skin, and multiple small foreign bodies along the skin surface. No underlying fracture seen. CT CERVICAL SPINE FINDINGS Mild scoliosis. No fracture line or displaced fracture fragment identified. Facet joints are normally aligned throughout. Multilevel degenerative disc disease, mild to moderate in degree, with associated disc space narrowings and mild  osseous spurring. No more than mild central canal stenosis appreciated at any level. Atherosclerotic calcifications at each carotid bulb region. Visualized paravertebral soft tissues are otherwise unremarkable. IMPRESSION: 1. Soft tissue edema overlying the midline upper frontal bones, with irregularities of the overlying skin (presumed laceration) and multiple small foreign bodies along the skin surface. No underlying fracture. 2. No acute intracranial abnormality. No intracranial hemorrhage or edema. 3. No fracture or acute subluxation within the cervical spine. Degenerative changes of the cervical spine, mild to moderate in degree, as detailed above. Electronically Signed   By: Franki Cabot M.D.   On: 07/26/2016 17:49  Ct Cervical Spine Wo Contrast  Result Date: 07/26/2016 CLINICAL DATA:  Frontal head abrasion. EXAM: CT HEAD WITHOUT CONTRAST CT CERVICAL SPINE WITHOUT CONTRAST TECHNIQUE: Multidetector CT imaging of the head and cervical spine was performed following the standard protocol without intravenous contrast. Multiplanar CT image reconstructions of the cervical spine were also generated. COMPARISON:  None. FINDINGS: CT HEAD FINDINGS There is mild generalized age related parenchymal atrophy with commensurate dilatation of the ventricles and sulci. Chronic small vessel ischemic changes noted within the bilateral periventricular and subcortical white matter regions. There is no mass, hemorrhage, edema or other evidence of acute parenchymal abnormality. No extra-axial hemorrhage. Soft tissue edema overlying the upper frontal  bones, with irregularities of the overlying skin, and multiple small foreign bodies along the skin surface. No underlying fracture seen. CT CERVICAL SPINE FINDINGS Mild scoliosis. No fracture line or displaced fracture fragment identified. Facet joints are normally aligned throughout. Multilevel degenerative disc disease, mild to moderate in degree, with associated disc space narrowings and mild osseous spurring. No more than mild central canal stenosis appreciated at any level. Atherosclerotic calcifications at each carotid bulb region. Visualized paravertebral soft tissues are otherwise unremarkable. IMPRESSION: 1. Soft tissue edema overlying the midline upper frontal bones, with irregularities of the overlying skin (presumed laceration) and multiple small foreign bodies along the skin surface. No underlying fracture. 2. No acute intracranial abnormality. No intracranial hemorrhage or edema. 3. No fracture or acute subluxation within the cervical spine. Degenerative changes of the cervical spine, mild to moderate in degree, as detailed above. Electronically Signed   By: Franki Cabot M.D.   On: 07/26/2016 17:49   Procedures .Marland KitchenLaceration Repair Date/Time: 07/27/2016 12:00 AM Performed by: Virgel Manifold Authorized by: Geronimo Boot   Laceration details:    Location:  Scalp   Length (cm):  2 Repair type:    Repair type:  Simple Treatment:    Area cleansed with:  Saline   Amount of cleaning:  Standard   Irrigation solution:  Sterile saline   Irrigation method:  Syringe Skin repair:    Repair method:  Sutures   Suture size:  4-0   Suture material:  Prolene   Suture technique:  Simple interrupted   Number of sutures:  2 Approximation:    Approximation:  Close   Vermilion border: well-aligned   Post-procedure details:    Patient tolerance of procedure:  Tolerated well, no immediate complications   (including critical care time)  Medications Ordered in ED Medications    oxyCODONE-acetaminophen (PERCOCET/ROXICET) 5-325 MG per tablet 1 tablet (1 tablet Oral Given 07/26/16 1830)  lidocaine-EPINEPHrine (XYLOCAINE W/EPI) 2 %-1:200000 (PF) injection 20 mL (20 mLs Intradermal Given 07/26/16 2021)     Initial Impression / Assessment and Plan / ED Course  I have reviewed the triage vital signs and the nursing notes.  Pertinent labs & imaging results that were available  during my care of the patient were reviewed by me and considered in my medical decision making (see chart for details).  Clinical Course    The patient is a 82 year old female presenting today after a mechanical fall, tripping over the curb, subsequently hit her head and landed on her right knee and left wrist.  On evaluation the patient's hemoglobin was stable and in no acute distress. Abrasion laceration of her for head. Laceration was repaired as above. CT head and cervical spine without acute abnormalities. X-ray right knee without abnormalities. X-ray left wrist with distal radius fracture and possible scaphoid fracture.  LUE neurovascularly intact.  Finding was communicated with Member of Dr. Bertis Ruddy group who recommends sugar tong splint and follow up tomorrow.  Tetanus UTD.   Labs and images were viewed by myself  incorporated into medical decision making.  Discussed pertinent finding with patient or caregiver prior to discharge with no further questions.  Immediate return precautions given and understood.  Medical decision making supervised by my attending Dr. Wilson Singer.    Geronimo Boot, MD PGY-3 Emergency Medicine   Final Clinical Impressions(s) / ED Diagnoses   Final diagnoses:  Fall, initial encounter  Scalp laceration, initial encounter  Distal radius fracture, left, closed, initial encounter    New Prescriptions Discharge Medication List as of 07/26/2016  9:11 PM       Geronimo Boot, MD 07/27/16 0001    Virgel Manifold, MD 08/06/16 1147

## 2016-07-26 NOTE — ED Notes (Signed)
Gave pt an ice pack. Informed Holly - RN.

## 2016-07-26 NOTE — ED Triage Notes (Signed)
Per [facility] patient was walking her dog and tripped over an uneven part of sidewalk in a park.  Denies LOC, abrasion to forehead, complains of head pain, neckpain, and left wrist pain.  Patient alert and oriented and this time, c-collar in place.

## 2016-07-26 NOTE — ED Notes (Signed)
Cleaned pt's forehead with soap and water. Once stitches were complete, applied Bacitracin and covered wound with a 2X2 gauze.

## 2016-07-27 DIAGNOSIS — S52572A Other intraarticular fracture of lower end of left radius, initial encounter for closed fracture: Secondary | ICD-10-CM | POA: Diagnosis not present

## 2016-07-30 DIAGNOSIS — G8918 Other acute postprocedural pain: Secondary | ICD-10-CM | POA: Diagnosis not present

## 2016-07-30 DIAGNOSIS — S62022A Displaced fracture of middle third of navicular [scaphoid] bone of left wrist, initial encounter for closed fracture: Secondary | ICD-10-CM | POA: Diagnosis not present

## 2016-07-30 DIAGNOSIS — S52572A Other intraarticular fracture of lower end of left radius, initial encounter for closed fracture: Secondary | ICD-10-CM | POA: Diagnosis not present

## 2016-07-30 DIAGNOSIS — S62025A Nondisplaced fracture of middle third of navicular [scaphoid] bone of left wrist, initial encounter for closed fracture: Secondary | ICD-10-CM | POA: Diagnosis not present

## 2016-07-30 DIAGNOSIS — Y999 Unspecified external cause status: Secondary | ICD-10-CM | POA: Diagnosis not present

## 2016-08-02 DIAGNOSIS — S52572A Other intraarticular fracture of lower end of left radius, initial encounter for closed fracture: Secondary | ICD-10-CM | POA: Diagnosis not present

## 2016-08-07 ENCOUNTER — Encounter: Payer: Self-pay | Admitting: Family

## 2016-08-07 ENCOUNTER — Ambulatory Visit (INDEPENDENT_AMBULATORY_CARE_PROVIDER_SITE_OTHER): Payer: Medicare Other | Admitting: Family

## 2016-08-07 DIAGNOSIS — S0190XA Unspecified open wound of unspecified part of head, initial encounter: Secondary | ICD-10-CM

## 2016-08-07 DIAGNOSIS — S060X0D Concussion without loss of consciousness, subsequent encounter: Secondary | ICD-10-CM | POA: Diagnosis not present

## 2016-08-07 DIAGNOSIS — S060XAA Concussion with loss of consciousness status unknown, initial encounter: Secondary | ICD-10-CM | POA: Insufficient documentation

## 2016-08-07 DIAGNOSIS — S060X9A Concussion with loss of consciousness of unspecified duration, initial encounter: Secondary | ICD-10-CM | POA: Insufficient documentation

## 2016-08-07 DIAGNOSIS — M7051 Other bursitis of knee, right knee: Secondary | ICD-10-CM | POA: Insufficient documentation

## 2016-08-07 NOTE — Assessment & Plan Note (Signed)
Symptoms and exam consistent with bursitis of the right knee following a mechanical fall. Treat conservatively with ice, compression, and elevation as needed. Follow-up if symptoms worsen or do not improve.

## 2016-08-07 NOTE — Patient Instructions (Addendum)
Thank you for choosing [facility].  Summary/Instructions:  Tylenol as needed for discomfort.   Continue to take your medications as prescribed.   Ice your knee x 20 minutes.   Fish oil 2 grams daily for 1-2 weeks.    If your symptoms worsen or fail to improve, please contact our office for further instruction, or in case of emergency go directly to the emergency room at the closest medical facility.

## 2016-08-07 NOTE — Assessment & Plan Note (Signed)
Open wound of the head appears with good healing approximation. Sutures removed without complication. Continue basic wound care and follow-up if symptoms worsen or as needed.

## 2016-08-07 NOTE — Progress Notes (Signed)
Subjective:    Patient ID: Faith Edwards, female    DOB: December 14, 1932, 82 y.o.   MRN: [id number]  Chief Complaint  Patient presents with  . Suture / Staple Removal    HPI:  Faith Edwards is a 82 y.o. female who  has a past medical history of Anxiety; Arthritis; Asthma; Bronchitis; Chronic headaches; Cystitis; Depression; Ear pain; History of blood transfusion; blood clots (2007 & 2010); Hyperthyroidism; Pelvic fracture ([place]); Pneumonia; Pulmonary embolism ([place]) (1993); Right wrist fracture; Shaking; Shortness of breath dyspnea; Skin cancer; Spinal stenosis; Trauma; and Urinary frequency. and presents today For a follow-up office visit.  This is a new problem. Recently evaluated in the emergency department for mechanical fall after tripping over a curb with the chief complaints of arthralgias and headaches. She was noted to have a wound on both her head and her knee with also left wrist tenderness and swelling. Her scalp wound measuring 2 cm was cleansed with saline and 2 sutures were placed. CT of the head and cervical spine without acute abnormalities. X-ray of the right knee without abnormalities. X-ray of left wrist with distal radius fracture and possible scaphoid fracture. Left upper extremity was neurovascularly intact. All ED records were reviewed.   Since leaving the ED she reports she continues to have occasional headaches that she indicates appear to be worsening some. Denies changes in vision, nausea, or vomiting. Does have some dizziness on occasion. She has been seen by Dr. Burney Gauze for her left upper extremity. Notes the wound has remained clean and dry with no evidence of infection. Her right knee continues to remain with mild edema which she has tried ice for and questions need for possible drainage.   Allergies  Allergen Reactions  . Penicillins Hives  . Sulfa Antibiotics Hives and Nausea And Vomiting    7-15     Current Outpatient Prescriptions on File Prior to Visit    Medication Sig Dispense Refill  . albuterol (PROVENTIL HFA;VENTOLIN HFA) 108 (90 BASE) MCG/ACT inhaler Inhale 2 puffs into the lungs every 6 (six) hours as needed for wheezing or shortness of breath. 1 Inhaler 0  . Aspirin-Acetaminophen-Caffeine (EXCEDRIN PO) Take 1 tablet by mouth every 6 (six) hours as needed (for migraines).     . benzonatate (TESSALON) 100 MG capsule Take 1 capsule (100 mg total) by mouth 3 (three) times daily as needed for cough. 20 capsule 1  . cholecalciferol (VITAMIN D) 1000 UNITS tablet Take 1,000 Units by mouth daily.    . Cyanocobalamin 1000 MCG/15ML LIQD Take 2,000 mcg by mouth every morning.     . [DISCONTINUED] Fluticasone-Salmeterol (ADVAIR) 100-50 MCG/DOSE AEPB Inhale 1 puff into the lungs 2 (two) times daily.     No current facility-administered medications on file prior to visit.      Past Surgical History:  Procedure Laterality Date  . ABDOMINAL HYSTERECTOMY    . BREAST ENHANCEMENT SURGERY  1985  . CHOLECYSTECTOMY    . COLONOSCOPY     diverticulosis  . ESI   11/2013    C7-T1 ; Dr Nelva Bush  . EYE SURGERY     bilat cataract surgery   . INSERTION OF MESH N/A 07/08/2015   Procedure: INSERTION OF MESH;  Surgeon: Armandina Gemma, MD;  Location: [facility];  Service: General;  Laterality: N/A;  . REPLACEMENT TOTAL KNEE BILATERAL  2007 & 2010  . SP FACET INJECTION  01/19/14   RC2-3,3-4,C4-5  . TONSILLECTOMY    . VENTRAL HERNIA REPAIR N/A 07/08/2015  Procedure: VENTRAL ADULT REPAIR VENTRAL INCISIONAL HERNIA REPAIR;  Surgeon: Armandina Gemma, MD;  Location: [facility];  Service: General;  Laterality: N/A;     Review of Systems  Constitutional: Negative for chills and fever.  Respiratory: Negative for chest tightness and shortness of breath.   Cardiovascular: Negative for chest pain, palpitations and leg swelling.  Neurological: Positive for dizziness and headaches. Negative for weakness, light-headedness and numbness.      Objective:    BP 122/78 (BP Location: Right  Arm, Patient Position: Sitting, Cuff Size: Normal)   Pulse 91   Temp 97.8 F (36.6 C) (Oral)   Resp 16   Ht 5\' 7"  (1.702 m)   Wt 172 lb (78 kg)   SpO2 95%   BMI 26.94 kg/m  Nursing note and vital signs reviewed.  Physical Exam  Constitutional: She is oriented to person, place, and time. She appears well-developed and well-nourished. No distress.  HENT:  Wound appears clean, dry with good approximation and 2 sutures in place. No evidence of infection.   Eyes: Conjunctivae and EOM are normal. Pupils are equal, round, and reactive to light.  Cardiovascular: Normal rate, regular rhythm, normal heart sounds and intact distal pulses.   Pulmonary/Chest: Effort normal and breath sounds normal.  Musculoskeletal:  Right knee - mild edema with no obvious deformity or discoloration. Tenderness elicited over prepatellar bursa with soft mobile fluid. Range of motion within normal limits. Strength is normal. Distal pulses and sensation are intact and appropriate. Ligamentous and meniscal testing are negative.  Neurological: She is alert and oriented to person, place, and time. No cranial nerve deficit. Coordination normal.  Skin: Skin is warm and dry.  Psychiatric: She has a normal mood and affect. Her behavior is normal. Judgment and thought content normal.       Assessment & Plan:   Problem List Items Addressed This Visit      Musculoskeletal and Integument   Bursitis of right knee    Symptoms and exam consistent with bursitis of the right knee following a mechanical fall. Treat conservatively with ice, compression, and elevation as needed. Follow-up if symptoms worsen or do not improve.        Other   Open wound of head    Open wound of the head appears with good healing approximation. Sutures removed without complication. Continue basic wound care and follow-up if symptoms worsen or as needed.      Mild concussion    Symptoms and exam consistent with mild concussion with residual  headache. No reported loss of consciousness. CT scan normal. Neurological exam normal today. Tylenol as needed for discomfort. Start Fish oil 2 weeks. Follow-up if symptoms worsen or do not improve.       Other Visit Diagnoses   None.      I have discontinued Ms. Shahan's doxycycline and valACYclovir. I am also having her maintain her Aspirin-Acetaminophen-Caffeine (EXCEDRIN PO), Cyanocobalamin, albuterol, cholecalciferol, benzonatate, and DULoxetine.   Meds ordered this encounter  Medications  . DULoxetine (CYMBALTA) 30 MG capsule    Sig: Take 30 mg by mouth daily.     Follow-up: Return if symptoms worsen or fail to improve.  Mauricio Po, FNP

## 2016-08-07 NOTE — Assessment & Plan Note (Signed)
Symptoms and exam consistent with mild concussion with residual headache. No reported loss of consciousness. CT scan normal. Neurological exam normal today. Tylenol as needed for discomfort. Start Fish oil 2 weeks. Follow-up if symptoms worsen or do not improve.

## 2016-08-08 ENCOUNTER — Ambulatory Visit: Payer: Medicare Other | Admitting: Internal Medicine

## 2016-08-09 DIAGNOSIS — M25639 Stiffness of unspecified wrist, not elsewhere classified: Secondary | ICD-10-CM | POA: Diagnosis not present

## 2016-08-09 DIAGNOSIS — M25532 Pain in left wrist: Secondary | ICD-10-CM | POA: Diagnosis not present

## 2016-08-23 DIAGNOSIS — S52572D Other intraarticular fracture of lower end of left radius, subsequent encounter for closed fracture with routine healing: Secondary | ICD-10-CM | POA: Diagnosis not present

## 2016-08-23 DIAGNOSIS — Z4789 Encounter for other orthopedic aftercare: Secondary | ICD-10-CM | POA: Diagnosis not present

## 2016-08-24 ENCOUNTER — Encounter: Payer: Self-pay | Admitting: Internal Medicine

## 2016-08-24 ENCOUNTER — Ambulatory Visit (INDEPENDENT_AMBULATORY_CARE_PROVIDER_SITE_OTHER): Payer: Medicare Other | Admitting: Internal Medicine

## 2016-08-24 VITALS — BP 136/88 | HR 86 | Temp 98.0°F | Resp 16 | Wt 171.0 lb

## 2016-08-24 DIAGNOSIS — E785 Hyperlipidemia, unspecified: Secondary | ICD-10-CM

## 2016-08-24 DIAGNOSIS — R2689 Other abnormalities of gait and mobility: Secondary | ICD-10-CM | POA: Insufficient documentation

## 2016-08-24 DIAGNOSIS — R739 Hyperglycemia, unspecified: Secondary | ICD-10-CM | POA: Diagnosis not present

## 2016-08-24 DIAGNOSIS — K219 Gastro-esophageal reflux disease without esophagitis: Secondary | ICD-10-CM

## 2016-08-24 NOTE — Patient Instructions (Addendum)
  Test(s) ordered today. Your results will be released to [place] (or called to you) after review, usually within 72hours after test completion. If any changes need to be made, you will be notified at that same time.  All other Health Maintenance issues reviewed.   All recommended immunizations and age-appropriate screenings are up-to-date or discussed.  No immunizations administered today.   Medications reviewed and updated.  No changes recommended at this time.  A referral was ordered for PT and nutrition.    Please followup in annually

## 2016-08-24 NOTE — Assessment & Plan Note (Signed)
GERD controlled Continue daily medication  

## 2016-08-24 NOTE — Assessment & Plan Note (Signed)
Refer to PT

## 2016-08-24 NOTE — Assessment & Plan Note (Signed)
Check a1c 

## 2016-08-24 NOTE — Progress Notes (Signed)
Pre visit review using our clinic review tool, if applicable. No additional management support is needed unless otherwise documented below in the visit note. 

## 2016-08-24 NOTE — Progress Notes (Signed)
Subjective:    Patient ID: Faith Edwards, female    DOB: 1932/10/25, 82 y.o.   MRN: [id number]  HPI The patient is here for follow up.  GERD, dysphagia/choking:  She saw GI and is taking zantac at night.  She had an barium swallow that showed a small hiatal hernia and mild age related esophageal dysmotility.    Hyperglycemia:  Her fasting sugar when it was last checked was elevated.   Hyperlipidemia:  She has elevated cholesterol.  She has never been on medication.  She is exercising regularly - walking and will start tai chi.    Medications and allergies reviewed with patient and updated if appropriate.  Patient Active Problem List   Diagnosis Date Noted  . Hyperglycemia 08/24/2016  . Open wound of head 08/07/2016  . Bursitis of right knee 08/07/2016  . Mild concussion 08/07/2016  . Herpes zoster 06/02/2016  . Upper airway cough syndrome 06/01/2016  . Asthma with exacerbation 05/30/2016  . Cough 05/30/2016  . Hyperlipidemia 03/22/2016  . Choking 03/15/2016  . GERD (gastroesophageal reflux disease) 03/15/2016  . Graves disease 01/04/2016  . Osteopenia, severe 10/14/2015  . Other constipation 09/01/2015  . Incisional hernia 07/08/2015  . Ventral hernia without obstruction or gangrene 05/12/2015  . Lower back pain 05/12/2015  . Spinal stenosis in cervical region 11/26/2013  . Depression 08/05/2008  . Personal history of venous thrombosis and embolism 08/05/2008  . Chronic rhinitis 12/29/2007  . Asthma 12/29/2007    Current Outpatient Prescriptions on File Prior to Visit  Medication Sig Dispense Refill  . albuterol (PROVENTIL HFA;VENTOLIN HFA) 108 (90 BASE) MCG/ACT inhaler Inhale 2 puffs into the lungs every 6 (six) hours as needed for wheezing or shortness of breath. 1 Inhaler 0  . Aspirin-Acetaminophen-Caffeine (EXCEDRIN PO) Take 1 tablet by mouth every 6 (six) hours as needed (for migraines).     . cholecalciferol (VITAMIN D) 1000 UNITS tablet Take 1,000 Units by  mouth daily.    . Cyanocobalamin 1000 MCG/15ML LIQD Take 2,000 mcg by mouth every morning.     . [DISCONTINUED] Fluticasone-Salmeterol (ADVAIR) 100-50 MCG/DOSE AEPB Inhale 1 puff into the lungs 2 (two) times daily.     No current facility-administered medications on file prior to visit.     Past Medical History:  Diagnosis Date  . Anxiety   . Arthritis   . Asthma   . Bronchitis    hx of   . [facility] headaches    [facility]  . Cystitis    1954  . Depression   . Ear pain    bilat   . History of blood transfusion    2010  . Hx of blood clots 2007 & 2010   post TKR  . Hyperthyroidism   . Pelvic fracture (HCC)    hx of in three places secondary to fall   . Pneumonia    hx of 1952  . Pulmonary embolism ([place]) 1993   on HRT  . Right wrist fracture    hx of 1995 has plate secondary to fall   . Shaking    hands bilat  . Shortness of breath dyspnea   . Skin cancer    basal cell   . Spinal stenosis   . Trauma    left lower leg 10/22/2014   . Urinary frequency     Past Surgical History:  Procedure Laterality Date  . ABDOMINAL HYSTERECTOMY    . BREAST ENHANCEMENT SURGERY  1985  . CHOLECYSTECTOMY    .  COLONOSCOPY     diverticulosis  . ESI   11/2013    C7-T1 ; Dr Nelva Bush  . EYE SURGERY     bilat cataract surgery   . INSERTION OF MESH N/A 07/08/2015   Procedure: INSERTION OF MESH;  Surgeon: Armandina Gemma, MD;  Location: [facility];  Service: General;  Laterality: N/A;  . REPLACEMENT TOTAL KNEE BILATERAL  2007 & 2010  . SP FACET INJECTION  01/19/14   RC2-3,3-4,C4-5  . TONSILLECTOMY    . VENTRAL HERNIA REPAIR N/A 07/08/2015   Procedure: VENTRAL ADULT REPAIR VENTRAL INCISIONAL HERNIA REPAIR;  Surgeon: Armandina Gemma, MD;  Location: [facility];  Service: General;  Laterality: N/A;    Social History   Social History  . Marital status: Married    Spouse name: N/A  . Number of children: 3  . Years of education: N/A   Occupational History  . retired    Social History Main Topics    . Smoking status: Never Smoker  . Smokeless tobacco: Never Used  . Alcohol use Yes     Comment: minimal  . Drug use: No  . Sexual activity: Not Asked   Other Topics Concern  . None   Social History Narrative  . None    Family History  Problem Relation Age of Onset  . Allergies Brother   . Asthma Brother   . Heart disease Brother   . Clotting disorder Father     PTE X 2  . Skin cancer Father   . Skin cancer Mother   . Sudden death      2 M aunts & 2 M uncles in 10s  . Heart attack Brother     > 55  . Diabetes Daughter   . Diabetes Son   . Mental illness Son     suicide  . Stroke Neg Hx     Review of Systems  Constitutional: Negative for fever.  Respiratory: Positive for cough (allergy related). Negative for shortness of breath and wheezing.   Cardiovascular: Negative for chest pain, palpitations and leg swelling.  Neurological: Negative for dizziness, light-headedness and headaches.       Objective:   Vitals:   08/24/16 1302  BP: 136/88  Pulse: 86  Resp: 16  Temp: 98 F (36.7 C)   Filed Weights   08/24/16 1302  Weight: 171 lb (77.6 kg)   Body mass index is 26.78 kg/m.   Physical Exam    Constitutional: Appears well-developed and well-nourished. No distress.  HENT:  Head: Normocephalic and atraumatic.  Neck: Neck supple. No tracheal deviation present. No thyromegaly present.  Cardiovascular: Normal rate, regular rhythm and normal heart sounds.   No murmur heard. No carotid bruit  Pulmonary/Chest: Effort normal and breath sounds normal. No respiratory distress. No has no wheezes. No rales.  Musculoskeletal: No edema.  Lymphadenopathy: No cervical adenopathy.  Skin: Skin is warm and dry. Not diaphoretic.  Psychiatric: Normal mood and affect. Behavior is normal.     Assessment & Plan:    See Problem List for Assessment and Plan of chronic medical problems.   F/u annually

## 2016-08-24 NOTE — Assessment & Plan Note (Addendum)
Recheck lipid panel, cmp Exercising Eating a low fat/cholesterol diet nutrition referral Consider atorvastatin low dose

## 2016-08-28 ENCOUNTER — Other Ambulatory Visit (INDEPENDENT_AMBULATORY_CARE_PROVIDER_SITE_OTHER): Payer: Medicare Other

## 2016-08-28 DIAGNOSIS — E785 Hyperlipidemia, unspecified: Secondary | ICD-10-CM | POA: Diagnosis not present

## 2016-08-28 DIAGNOSIS — R739 Hyperglycemia, unspecified: Secondary | ICD-10-CM | POA: Diagnosis not present

## 2016-08-28 LAB — COMPREHENSIVE METABOLIC PANEL
ALT: 13 U/L (ref 0–35)
AST: 19 U/L (ref 0–37)
Albumin: 4 g/dL (ref 3.5–5.2)
Alkaline Phosphatase: 100 U/L (ref 39–117)
BUN: 17 mg/dL (ref 6–23)
CHLORIDE: 106 meq/L (ref 96–112)
CO2: 27 meq/L (ref 19–32)
Calcium: 9.2 mg/dL (ref 8.4–10.5)
Creatinine, Ser: 1.05 mg/dL (ref 0.40–1.20)
GFR: 53.25 mL/min — AB (ref >60.00)
GLUCOSE: 115 mg/dL — AB (ref 70–99)
POTASSIUM: 4.4 meq/L (ref 3.5–5.1)
Sodium: 139 mEq/L (ref 135–145)
Total Bilirubin: 0.9 mg/dL (ref 0.2–1.2)
Total Protein: 6.7 g/dL (ref 6.0–8.3)

## 2016-08-28 LAB — HEMOGLOBIN A1C: Hgb A1c MFr Bld: 5.5 % (ref 4.6–6.5)

## 2016-08-28 LAB — LIPID PANEL
CHOL/HDL RATIO: 4
Cholesterol: 242 mg/dL — ABNORMAL HIGH (ref 0–200)
HDL: 60.9 mg/dL (ref >39.00)
LDL CALC: 163 mg/dL — AB (ref 0–99)
NONHDL: 181.27
Triglycerides: 93 mg/dL (ref 0.0–149.0)
VLDL: 18.6 mg/dL (ref 0.0–40.0)

## 2016-08-30 ENCOUNTER — Encounter: Payer: Self-pay | Admitting: Internal Medicine

## 2016-08-30 ENCOUNTER — Ambulatory Visit (INDEPENDENT_AMBULATORY_CARE_PROVIDER_SITE_OTHER): Payer: Medicare Other | Admitting: Internal Medicine

## 2016-08-30 VITALS — BP 124/74 | HR 90 | Wt 170.0 lb

## 2016-08-30 DIAGNOSIS — E0500 Thyrotoxicosis with diffuse goiter without thyrotoxic crisis or storm: Secondary | ICD-10-CM | POA: Diagnosis not present

## 2016-08-30 LAB — T4, FREE: Free T4: 0.79 ng/dL (ref 0.60–1.60)

## 2016-08-30 LAB — T3, FREE: T3, Free: 3.1 pg/mL (ref 2.3–4.2)

## 2016-08-30 LAB — TSH: TSH: 2.01 u[IU]/mL (ref 0.35–4.50)

## 2016-08-30 NOTE — Progress Notes (Signed)
Patient ID: Faith Edwards, female   DOB: 1932-06-17, 82 y.o.   MRN: [id number]   HPI  Faith Edwards is a 82 y.o.-year-old female, returning for f/u for thyrotoxicosis, likely Graves ds. Last visit 5 mo ago.   She had a fall after stumbling >> hit head >> slight concussion and now has a metal plate in L forearm.  She feels much better now.   I reviewed pt's thyroid tests: Lab Results  Component Value Date   TSH 3.08 04/06/2016   TSH 2.20 01/04/2016   TSH 3.09 11/16/2015   TSH 0.04 (L) 09/29/2015   TSH 0.02 (L) 09/19/2015   TSH 0.04 (L) 09/12/2015   TSH 4.09 01/07/2014   TSH 3.54 05/07/2012   TSH 3.89 03/16/2011   TSH 3.77 09/05/2010   FREET4 0.77 04/06/2016   FREET4 0.79 01/04/2016   FREET4 0.62 11/16/2015   FREET4 1.02 09/29/2015   FREET4 0.9 08/05/2008    Component     Latest Ref Rng 09/29/2015  TSI     <140 % baseline 531 (H)   In the light of the high TSI, we assume that she had Graves' disease (bypassed the thyroid uptake and scan) and started methimazole. In 11/2015, We decreased MMI to 5 mg qod >> she then stopped (by herself! > 6 mo ago. Subsequent TFTs have been normal >> we kept her off the MMI at last visit.  We stopped atenolol.   Pt denies feeling nodules in neck, no hoarseness, no dysphagia/no odynophagia, no SOB with lying down; she c/o: - + fatigue - + insomnia -- on Ativan by Dr Amalia Greenhouse - + heat intolerance (had night sweats  - resolved) - + hair loss  Pt does have a FH of thyroid ds.: + mother. No FH of thyroid cancer. No h/o radiation tx to head or neck.  No seaweed or kelp, no recent contrast studies. No steroid use. No herbal supplements. No Biotin use.  ROS: Constitutional: + see HPI, + nocturia Eyes: no blurry vision, no xerophthalmia ENT: no sore throat, no nodules palpated in throat, no dysphagia/no odynophagia, no hoarseness Cardiovascular: no CP/no SOB/palpitations/no leg swelling Respiratory:+ cough/+SOB/+ wheezing Gastrointestinal: no  N/V/DC/heartburn Musculoskeletal: no muscle aches/+ joint aches Skin: no rashes, + hair loss Neurological:no tremors/no numbness/tingling/dizziness, + HA  I reviewed pt's medications, allergies, PMH, social hx, family hx, and changes were documented in the history of present illness. Otherwise, unchanged from my initial visit note.  Past Medical History:  Diagnosis Date  . Anxiety   . Arthritis   . Asthma   . Bronchitis    hx of   . [facility] headaches    [facility]  . Cystitis    1954  . Depression   . Ear pain    bilat   . History of blood transfusion    2010  . Hx of blood clots 2007 & 2010   post TKR  . Hyperthyroidism   . Pelvic fracture (HCC)    hx of in three places secondary to fall   . Pneumonia    hx of 1952  . Pulmonary embolism ([place]) 1993   on HRT  . Right wrist fracture    hx of 1995 has plate secondary to fall   . Shaking    hands bilat  . Shortness of breath dyspnea   . Skin cancer    basal cell   . Spinal stenosis   . Trauma    left lower leg 10/22/2014   .  Urinary frequency    Past Surgical History:  Procedure Laterality Date  . ABDOMINAL HYSTERECTOMY    . BREAST ENHANCEMENT SURGERY  1985  . CHOLECYSTECTOMY    . COLONOSCOPY     diverticulosis  . ESI   11/2013    C7-T1 ; Dr Nelva Bush  . EYE SURGERY     bilat cataract surgery   . INSERTION OF MESH N/A 07/08/2015   Procedure: INSERTION OF MESH;  Surgeon: Armandina Gemma, MD;  Location: [facility];  Service: General;  Laterality: N/A;  . REPLACEMENT TOTAL KNEE BILATERAL  2007 & 2010  . SP FACET INJECTION  01/19/14   RC2-3,3-4,C4-5  . TONSILLECTOMY    . VENTRAL HERNIA REPAIR N/A 07/08/2015   Procedure: VENTRAL ADULT REPAIR VENTRAL INCISIONAL HERNIA REPAIR;  Surgeon: Armandina Gemma, MD;  Location: [facility];  Service: General;  Laterality: N/A;   Social History   Social History  . Marital status: Married    Spouse name: N/A  . Number of children: 3  . Years of education: N/A   Occupational History  .  retired    Social History Main Topics  . Smoking status: Never Smoker  . Smokeless tobacco: Never Used  . Alcohol use Yes     Comment: minimal  . Drug use: No  . Sexual activity: Not on file   Other Topics Concern  . Not on file   Social History Narrative  . No narrative on file   Current Outpatient Prescriptions on File Prior to Visit  Medication Sig Dispense Refill  . albuterol (PROVENTIL HFA;VENTOLIN HFA) 108 (90 BASE) MCG/ACT inhaler Inhale 2 puffs into the lungs every 6 (six) hours as needed for wheezing or shortness of breath. 1 Inhaler 0  . Aspirin-Acetaminophen-Caffeine (EXCEDRIN PO) Take 1 tablet by mouth every 6 (six) hours as needed (for migraines).     . cholecalciferol (VITAMIN D) 1000 UNITS tablet Take 1,000 Units by mouth daily.    . Cyanocobalamin 1000 MCG/15ML LIQD Take 2,000 mcg by mouth every morning.     . DULoxetine (CYMBALTA) 20 MG capsule Take 40 mg by mouth daily.    . ranitidine (ZANTAC) 150 MG tablet Take 150 mg by mouth at bedtime.    . [DISCONTINUED] Fluticasone-Salmeterol (ADVAIR) 100-50 MCG/DOSE AEPB Inhale 1 puff into the lungs 2 (two) times daily.     No current facility-administered medications on file prior to visit.    Allergies  Allergen Reactions  . Penicillins Hives  . Sulfa Antibiotics Hives and Nausea And Vomiting    7-15   Family History  Problem Relation Age of Onset  . Allergies Brother   . Asthma Brother   . Heart disease Brother   . Clotting disorder Father     PTE X 2  . Skin cancer Father   . Skin cancer Mother   . Sudden death      2 M aunts & 2 M uncles in 69s  . Heart attack Brother     > 55  . Diabetes Daughter   . Diabetes Son   . Mental illness Son     suicide  . Stroke Neg Hx    PE: BP 124/74 (BP Location: Left Arm, Patient Position: Sitting)   Pulse 90   Wt 170 lb (77.1 kg)   SpO2 97%   BMI 26.63 kg/m  Body mass index is 26.63 kg/m. Wt Readings from Last 3 Encounters:  08/30/16 170 lb (77.1 kg)   08/24/16 171 lb (77.6 kg)  08/07/16 172 lb (78 kg)   Constitutional: normal weight, in NAD Eyes: PERRLA, EOMI ENT: moist mucous membranes, no thyromegaly, no cervical lymphadenopathy Cardiovascular: RRR, No MRG Respiratory: CTA B Gastrointestinal: abdomen soft, NT, ND, BS+ Musculoskeletal: no deformities, strength intact in all 4 Skin: moist, warm, no rashes Neurological: no tremor with outstretched hands, DTR normal in all 4  ASSESSMENT: 1. Graves ds.  PLAN:  1. Patient with h/o thyrotoxicosis, with high TSI >> likely Graves ds (mild), initially with thyrotoxic sxs: weight loss, heat intolerance, hyperdefecation, palpitations, anxiety. Now with insomnia, fatigue and hair loss. She was on MMI, but stopped MMI by herself before last visit >> at last visit, TFTs were normal, so we continued off MMI - as she had tachycardia, tremors and insomnia in the past >> we added low dose of atenolol, 25 mg daily >> now off - will check the TSH, fT3 and fT4 today  - RTC in 1 year and in 6 months for repeat labs  Component     Latest Ref Rng & Units 08/30/2016  TSH     0.35 - 4.50 uIU/mL 2.01  T4,Free(Direct)     0.60 - 1.60 ng/dL 0.79  Triiodothyronine,Free,Serum     2.3 - 4.2 pg/mL 3.1   TFTs are normal. Plan to repeat the TFTs in 6 months.  Philemon Kingdom, MD PhD [facility] Endocrinology

## 2016-08-30 NOTE — Patient Instructions (Signed)
Please continue off Methimazole.  Please stop at the lab.  Please come back for a follow-up appointment in 1 year and in 6 months for labs.

## 2016-08-31 ENCOUNTER — Telehealth: Payer: Self-pay

## 2016-08-31 NOTE — Telephone Encounter (Signed)
Called patient and gave lab results. Advised patient to call back to make appointment in 6 months for labs. Patient stated she would call back as she was not around her calender right now. Patient had no other questions or concerns.

## 2016-09-06 ENCOUNTER — Other Ambulatory Visit: Payer: Self-pay | Admitting: Emergency Medicine

## 2016-09-06 ENCOUNTER — Other Ambulatory Visit: Payer: Self-pay | Admitting: Internal Medicine

## 2016-09-06 DIAGNOSIS — E785 Hyperlipidemia, unspecified: Secondary | ICD-10-CM

## 2016-09-06 MED ORDER — ATORVASTATIN CALCIUM 20 MG PO TABS: 20.0000 mg | ORAL_TABLET | Freq: Every day | ORAL | 3 refills | Status: DC

## 2016-09-12 DIAGNOSIS — F064 Anxiety disorder due to known physiological condition: Secondary | ICD-10-CM | POA: Diagnosis not present

## 2016-09-25 DIAGNOSIS — S52572A Other intraarticular fracture of lower end of left radius, initial encounter for closed fracture: Secondary | ICD-10-CM | POA: Diagnosis not present

## 2016-09-25 DIAGNOSIS — Z23 Encounter for immunization: Secondary | ICD-10-CM | POA: Diagnosis not present

## 2016-09-28 ENCOUNTER — Ambulatory Visit: Payer: Medicare Other | Admitting: Internal Medicine

## 2016-10-05 ENCOUNTER — Ambulatory Visit: Payer: Medicare Other | Attending: Internal Medicine | Admitting: Rehabilitative and Restorative Service Providers"

## 2016-10-05 DIAGNOSIS — R2689 Other abnormalities of gait and mobility: Secondary | ICD-10-CM | POA: Diagnosis not present

## 2016-10-05 DIAGNOSIS — M6281 Muscle weakness (generalized): Secondary | ICD-10-CM | POA: Insufficient documentation

## 2016-10-06 NOTE — Therapy (Signed)
[facility] [address] [place], [place], [address] Phone: [phone]   Fax:  [phone]  Physical Therapy Evaluation  Patient Details  Name: Faith Edwards MRN: [id number] Date of Birth: June 28, 1932 Referring Provider: Billey Gosling, MD  Encounter Date: 10/05/2016      PT End of Session - 10/05/16 1941    Visit Number 1   Number of Visits 8   Date for PT Re-Evaluation 11/04/16   Authorization Type G code every 10th visit   PT Start Time 1405   PT Stop Time 1450   PT Time Calculation (min) 45 min   Equipment Utilized During Treatment Gait belt   Activity Tolerance Patient tolerated treatment well   Behavior During Therapy [facility] for tasks assessed/performed      Past Medical History:  Diagnosis Date  . Anxiety   . Arthritis   . Asthma   . Bronchitis    hx of   . Chronic headaches    [facility]  . Cystitis    1954  . Depression   . Ear pain    bilat   . History of blood transfusion    2010  . Hx of blood clots 2007 & 2010   post TKR  . Hyperthyroidism   . Pelvic fracture (HCC)    hx of in three places secondary to fall   . Pneumonia    hx of 1952  . Pulmonary embolism ([place]) 1993   on HRT  . Right wrist fracture    hx of 1995 has plate secondary to fall   . Shaking    hands bilat  . Shortness of breath dyspnea   . Skin cancer    basal cell   . Spinal stenosis   . Trauma    left lower leg 10/22/2014   . Urinary frequency     Past Surgical History:  Procedure Laterality Date  . ABDOMINAL HYSTERECTOMY    . BREAST ENHANCEMENT SURGERY  1985  . CHOLECYSTECTOMY    . COLONOSCOPY     diverticulosis  . ESI   11/2013    C7-T1 ; Dr Nelva Bush  . EYE SURGERY     bilat cataract surgery   . INSERTION OF MESH N/A 07/08/2015   Procedure: INSERTION OF MESH;  Surgeon: Armandina Gemma, MD;  Location: [facility];  Service: General;  Laterality: N/A;  . REPLACEMENT TOTAL KNEE BILATERAL  2007 & 2010  . SP FACET  INJECTION  01/19/14   RC2-3,3-4,C4-5  . TONSILLECTOMY    . VENTRAL HERNIA REPAIR N/A 07/08/2015   Procedure: VENTRAL ADULT REPAIR VENTRAL INCISIONAL HERNIA REPAIR;  Surgeon: Armandina Gemma, MD;  Location: [facility];  Service: General;  Laterality: N/A;    There were no vitals filed for this visit.       Subjective Assessment - 10/05/16 1411    Subjective The patient is s/p severe fall 2 months ago with wrist surgery, and hit her head during fall.   She feels that fall was due to sidewalk area being blocked off.  The patient reports she has rules of "not stepping off curbs, I don't go down steps that don't have handrails, and have husband walk in front of /behind her while on spiral staircase."  The patient is wearing a shoe size too large b/c of her sore toe.     Patient Stated Goals "I hope I can gain confidence in walking."   Start back to tai chi.   Currently in  Pain? Yes   Pain Score --  PT to monitor response to treatment, but no goal to follow due to nature of referral.   Pain Location Shoulder   Pain Orientation Right   Pain Descriptors / Indicators Aching   Pain Type Chronic pain   Pain Onset More than a month ago   Pain Frequency Constant   Aggravating Factors  PT to monitor, no goals to follow.            [facility] PT Assessment - 10/05/16 1410      Assessment   Medical Diagnosis poor balance   Referring Provider Billey Gosling, MD   Onset Date/Surgical Date --  06/2016   Hand Dominance Left   Prior Therapy none     Precautions   Precautions Fall     Restrictions   Weight Bearing Restrictions No     Balance Screen   Has the patient fallen in the past 6 months Yes   How many times? 1   Has the patient had a decrease in activity level because of a fear of falling?  Yes   Is the patient reluctant to leave their home because of a fear of falling?  Yes     [place] Private residence   Living Arrangements Spouse/significant other   Type of [place] to enter   Entrance Stairs-Number of Steps 2   Entrance Stairs-Rails Right   Home Layout Two level   Alternate Level Stairs-Number of Steps --  15   Alternate Level Stairs-Rails Right  *spiral staircase, needs help to negotiate   Home Equipment None   Additional Comments patient has guard rails in bathroom.  She wants a shower chair, but notes husband does not like the appearance of one.  Feels most insecure when on spiral staircase and while walking her dog.      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     Observation/Other Assessments   Focus on Therapeutic Outcomes (FOTO)  49%   Activities of Balance Confidence Scale (ABC Scale)  26.9%     Sensation   Light Touch Impaired Detail   Additional Comments numbness in hands; reports cold sensation in hands and feet.     Posture/Postural Control   Posture/Postural Control Postural limitations   Postural Limitations Forward head;Rounded Shoulders     ROM / Strength   AROM / PROM / Strength AROM;Strength     AROM   Overall AROM  Deficits   Overall AROM Comments Mildly diminished R UE AROM as compared to the L shoulder.  Mildly dec'd ankle dorsiflexion.     Strength   Overall Strength Deficits   Overall Strength Comments 4/5 bilateral shoulder flexion and abduction; 4/5 bilateral elbow flexion.  5/5 bilateral hip flexion, 5/5 bilateral knee flexion/extension, 3/5 ankle dorsiflexion with mildly diminished AROM.      Transfers   Transfers Sit to Stand   Five time sit to stand comments  15.41 seconds without UE support.     Ambulation/Gait   Ambulation/Gait Yes   Ambulation/Gait Assistance 6: Modified independent (Device/Increase time)  slowed pace, holds walls intermittently   Ambulation Distance (Feet) 200 Feet   Assistive device None   Gait Pattern Step-through pattern;Decreased stride length;Decreased dorsiflexion -  right;Decreased dorsiflexion - left   Gait velocity 2.27 ft/sec   Stairs Yes   Stairs Assistance 5: Supervision  Stairs Assistance Details (indicate cue type and reason) toes hit edge of step ascending   Stair Management Technique One rail Right;Alternating pattern   Number of Stairs 4     Standardized Balance Assessment   Standardized Balance Assessment Berg Balance Test;Timed Up and Go Test     Berg Balance Test   Sit to Stand Able to stand  independently using hands   Standing Unsupported Able to stand safely 2 minutes   Sitting with Back Unsupported but Feet Supported on Floor or Stool Able to sit safely and securely 2 minutes   Stand to Sit Sits safely with minimal use of hands   Transfers Able to transfer safely, definite need of hands   Standing Unsupported with Eyes Closed Able to stand 10 seconds safely   Standing Ubsupported with Feet Together Able to place feet together independently and stand 1 minute safely   From Standing, Reach Forward with Outstretched Arm Can reach forward >12 cm safely (5")   From Standing Position, Pick up Object from Floor Able to pick up shoe safely and easily   From Standing Position, Turn to Look Behind Over each Shoulder Looks behind one side only/other side shows less weight shift   Turn 360 Degrees Able to turn 360 degrees safely but slowly   Standing Unsupported, Alternately Place Feet on Step/Stool Able to stand independently and safely and complete 8 steps in 20 seconds   Standing Unsupported, One Foot in Front Able to take small step independently and hold 30 seconds   Standing on One Leg Tries to lift leg/unable to hold 3 seconds but remains standing independently   Total Score 45   Berg comment: 45/56 indicating high fall risk     Timed Up and Go Test   TUG --  15.13 seconds indicating high fall risk            Vestibular Assessment - 10/05/16 1425      Vestibular Assessment   General Observation reports intermittent h/o  vertigo related to "fluid in my head"     Symptom Behavior   Frequency of Dizziness 2 days per month; takes med intermittently for dizziness                       PT Education - 10/05/16 1938    Education provided Yes   Education Details Physical therapy goals and plan for treatment.   Person(s) Educated Patient   Methods Explanation   Comprehension Verbalized understanding             PT Long Term Goals - 10/05/16 1942      PT LONG TERM GOAL #1   Title The patient will be indep with HEP for for posture, ankle strengthening, balance and general conditioning.   Baseline Target date 11/05/2016   Time 4   Period Weeks     PT LONG TERM GOAL #2   Title The patient will improve ABC score from 26.9% to > or equal to 38% to demo improved confidence with balance.   Baseline Target date 11/05/2016   Time 4   Period Weeks     PT LONG TERM GOAL #3   Title The patient wil reduce TUG score from 15.13 seconds to < or equal to 13.5 seconds to demo dec'd risk for falls.   Baseline Target date 11/05/2016   Time 4   Period Weeks     PT LONG TERM GOAL #4   Title The patient will  improve gait speed from 2.27 ft/sec to > or equal to 2.62 ft/sec to demo transition to "full community ambulator" classification of gait.   Baseline Target date 11/05/2016   Time 4   Period Weeks     PT LONG TERM GOAL #5   Title The patient will improve Berg from 45/56 to > or equal to 49/56 to demo dec'd risk for falls.   Baseline Target date 11/05/2016   Time 4   Period Weeks     Additional Long Term Goals   Additional Long Term Goals Yes     PT LONG TERM GOAL #6   Title The patient will negotiate 4 steps with one handrail modified indep using reciprocal pattern.   Baseline Target date 11/05/2016   Time 4   Period Weeks               Plan - 10/05/16 3491    Clinical Impression Statement The patient is an 82 year old female presenting to OP rehab with abnormal posture, ankle  weakness, abnormality of gait and imbalance.  She has sustained a fall with injury within the past 6 months and will benefit from skilled PT to optimize current functional status.   Rehab Potential Good   PT Frequency 2x / week   PT Duration 4 weeks   PT Treatment/Interventions ADLs/Self Care Home Management;Neuromuscular re-education;Therapeutic activities;Therapeutic exercise;Balance training;Gait training;Functional mobility training;DME Instruction;Patient/family education   PT Next Visit Plan Establish HEP:  single limb stance near countertop, heel cord stretch, ankle DF strengthening, high level balance activities, posture.   Consulted and Agree with Plan of Care Patient      Patient will benefit from skilled therapeutic intervention in order to improve the following deficits and impairments:  Abnormal gait, Decreased balance, Decreased mobility, Decreased strength, Postural dysfunction, Difficulty walking  Visit Diagnosis: Other abnormalities of gait and mobility  Muscle weakness (generalized)     Problem List Patient Active Problem List   Diagnosis Date Noted  . Hyperglycemia 08/24/2016  . Poor balance 08/24/2016  . Open wound of head 08/07/2016  . Bursitis of right knee 08/07/2016  . Mild concussion 08/07/2016  . Herpes zoster 06/02/2016  . Upper airway cough syndrome 06/01/2016  . Asthma with exacerbation 05/30/2016  . Cough 05/30/2016  . Hyperlipidemia 03/22/2016  . Choking 03/15/2016  . GERD (gastroesophageal reflux disease) 03/15/2016  . Graves disease 01/04/2016  . Osteopenia, severe 10/14/2015  . Other constipation 09/01/2015  . Incisional hernia 07/08/2015  . Ventral hernia without obstruction or gangrene 05/12/2015  . Lower back pain 05/12/2015  . Spinal stenosis in cervical region 11/26/2013  . Depression 08/05/2008  . Personal history of venous thrombosis and embolism 08/05/2008  . Chronic rhinitis 12/29/2007  . Asthma 12/29/2007    [place],  PT 10/06/2016, 6:50 AM  [facility] [address] [place], [place], [address] Phone: [phone]   Fax:  [phone]  Name: Faith Edwards MRN: [id number] Date of Birth: 03-17-1932

## 2016-10-08 ENCOUNTER — Ambulatory Visit: Payer: Medicare Other | Admitting: Rehabilitative and Restorative Service Providers"

## 2016-10-10 ENCOUNTER — Ambulatory Visit: Payer: Medicare Other | Admitting: Physical Therapy

## 2016-10-10 DIAGNOSIS — R2689 Other abnormalities of gait and mobility: Secondary | ICD-10-CM

## 2016-10-10 DIAGNOSIS — M6281 Muscle weakness (generalized): Secondary | ICD-10-CM

## 2016-10-10 NOTE — Patient Instructions (Signed)
Heel Raise: Bilateral (Standing)    Rise on balls of feet. Repeat _10__ times per set. Do _1__ sets per session. Do _2__ sessions per day.  ALSO DO EACH LEG SEPARATELY - 10 reps each leg  Dorsiflexion: Resisted    Facing anchor, tubing around left foot, pull toward face.  Repeat _10___ times per set. Do 1__ sets per session. Do 1__ sessions per day. Do in standing position  http://orth.exer.us/9   Copyright  VHI. All rights reserved.  Neck: Retraction    Sit with back and head straight. Pull chin back to line up ear with shoulder. Do not turn or tilt head. May assist if child cannot keep correct position. Hold _5___ seconds. Repeat __10__ times. Do __2_ sessions per day.  AGAINST WALL CAUTION: Movement should be gentle, steady and slow.  Copyright  VHI. All rights reserved.    http://orth.exer.us/39   Copyright  VHI. All rights reserved.  Gastroc / Heel Cord Stretch - On Step    Stand with heels over edge of stair. Holding rail, lower heels until stretch is felt in calf of legs. Repeat _2__ times. Do __2_ times per day.  HOLD 30 secs Copyright  VHI. All rights reserved.  Tandem Stance    Right foot in front of left, heel touching toe both feet "straight ahead". Stand on Foot Triangle of Support with both feet. Balance in this position _30_ seconds. Do with left foot in front of right.  Copyright  VHI. All rights reserved.  SINGLE LIMB STANCE    Stance: single leg on floor. Raise leg. Hold __10_ seconds. Repeat with other leg. _2_ reps per set, _2__ sets per day, _5__ days per week  Copyright  VHI. All rights reserved.

## 2016-10-10 NOTE — Therapy (Signed)
[facility] [address] [place], [place], [address] Phone: [phone]   Fax:  [phone]  Physical Therapy Treatment  Patient Details  Name: Faith Edwards MRN: [id number] Date of Birth: Oct 17, 1932 Referring Provider: Billey Gosling, MD  Encounter Date: 10/10/2016      PT End of Session - 10/10/16 2006    Visit Number 2   Date for PT Re-Evaluation 11/04/16   Authorization Type G code every 10th visit   PT Start Time 1017   PT Stop Time 1105   PT Time Calculation (min) 48 min      Past Medical History:  Diagnosis Date  . Anxiety   . Arthritis   . Asthma   . Bronchitis    hx of   . Chronic headaches    [facility]  . Cystitis    1954  . Depression   . Ear pain    bilat   . History of blood transfusion    2010  . Hx of blood clots 2007 & 2010   post TKR  . Hyperthyroidism   . Pelvic fracture (HCC)    hx of in three places secondary to fall   . Pneumonia    hx of 1952  . Pulmonary embolism ([place]) 1993   on HRT  . Right wrist fracture    hx of 1995 has plate secondary to fall   . Shaking    hands bilat  . Shortness of breath dyspnea   . Skin cancer    basal cell   . Spinal stenosis   . Trauma    left lower leg 10/22/2014   . Urinary frequency     Past Surgical History:  Procedure Laterality Date  . ABDOMINAL HYSTERECTOMY    . BREAST ENHANCEMENT SURGERY  1985  . CHOLECYSTECTOMY    . COLONOSCOPY     diverticulosis  . ESI   11/2013    C7-T1 ; Dr Nelva Bush  . EYE SURGERY     bilat cataract surgery   . INSERTION OF MESH N/A 07/08/2015   Procedure: INSERTION OF MESH;  Surgeon: Armandina Gemma, MD;  Location: [facility];  Service: General;  Laterality: N/A;  . REPLACEMENT TOTAL KNEE BILATERAL  2007 & 2010  . SP FACET INJECTION  01/19/14   RC2-3,3-4,C4-5  . TONSILLECTOMY    . VENTRAL HERNIA REPAIR N/A 07/08/2015   Procedure: VENTRAL ADULT REPAIR VENTRAL INCISIONAL HERNIA REPAIR;  Surgeon: Armandina Gemma,  MD;  Location: [facility];  Service: General;  Laterality: N/A;    There were no vitals filed for this visit.      Subjective Assessment - 10/10/16 1955    Subjective Pt reports she had more dizziness yesterday than she does today - states she is not feeling quite as well today as she was last Friday at time of evaluation; thinks she sustained a concussion when she fell in July                                                Patient Stated Goals "I hope I can gain confidence in walking."   Start back to tai chi.   Currently in Pain? Yes   Pain Score --  mild to moderate   Pain Location Shoulder   Pain Orientation Right   Pain Descriptors / Indicators Aching  Pain Type Chronic pain     TherEx;  Pt performed heel raises bil. LE's 10 reps with UE support;  Heel cord stretch for RLE and LLE 30 sec hold x 2 reps Seated dorsiflexion with red theraband for resistance - 10 reps each LE; progressed this exercise to standing due to No difficulty reported in seated position: much less AROM noted in standing - pt performed 10 reps with red theraband in standing with  Limited ROM  Ankle sways inside // bars 10 reps  sit to stand 5 reps without UE support from mat surface  Cervical retraction for postural abnormality - pt performed in standing 10 reps with 2-3 sec hold, using wall as feedback  Instructed in HEP - see pt instructions     NeuroRe-ed:  Pt performed alternate tap ups to 1st step (6") 10 reps each; then to 2nd step for incr. SLS - 10 reps with  UE support prn with CGA              Balance Exercises - 10/10/16 1040      Balance Exercises: Standing   Tandem Stance Eyes open;2 reps;30 secs   SLS Eyes open;Solid surface;2 reps;10 secs   Rockerboard Anterior/posterior;EO;30 seconds;Intermittent UE support   Sidestepping 2 reps  inside // bars without UE support   Other Standing Exercises standing on blue Airex - marching in place, alternate tap down to floor with UE  support           PT Education - 10/10/16 2005    Education provided Yes   Education Details HEP for balance and LE strenthening - see pt education   Person(s) Educated Patient   Methods Explanation;Handout;Demonstration   Comprehension Verbalized understanding;Returned demonstration             PT Long Term Goals - 10/05/16 1942      PT LONG TERM GOAL #1   Title The patient will be indep with HEP for for posture, ankle strengthening, balance and general conditioning.   Baseline Target date 11/05/2016   Time 4   Period Weeks     PT LONG TERM GOAL #2   Title The patient will improve ABC score from 26.9% to > or equal to 38% to demo improved confidence with balance.   Baseline Target date 11/05/2016   Time 4   Period Weeks     PT LONG TERM GOAL #3   Title The patient wil reduce TUG score from 15.13 seconds to < or equal to 13.5 seconds to demo dec'd risk for falls.   Baseline Target date 11/05/2016   Time 4   Period Weeks     PT LONG TERM GOAL #4   Title The patient will improve gait speed from 2.27 ft/sec to > or equal to 2.62 ft/sec to demo transition to "full community ambulator" classification of gait.   Baseline Target date 11/05/2016   Time 4   Period Weeks     PT LONG TERM GOAL #5   Title The patient will improve Berg from 45/56 to > or equal to 49/56 to demo dec'd risk for falls.   Baseline Target date 11/05/2016   Time 4   Period Weeks     Additional Long Term Goals   Additional Long Term Goals Yes     PT LONG TERM GOAL #6   Title The patient will negotiate 4 steps with one handrail modified indep using reciprocal pattern.   Baseline Target date 11/05/2016   Time  [place] - 10/10/16 2006    Clinical Impression Statement Pt needs intermittent UE support with high level balance exercises, due to decreased SLS; pt also has significant fear of falling, with apprehension with curb and step negotiation; decreased  dorsiflexion noted in standing compared to pt's ROM with this movement in seated position   Rehab Potential Good   PT Frequency 2x / week   PT Duration 4 weeks   PT Treatment/Interventions ADLs/Self Care Home Management;Neuromuscular re-education;Therapeutic activities;Therapeutic exercise;Balance training;Gait training;Functional mobility training;DME Instruction;Patient/family education   PT Next Visit Plan check understanding of HEP given on 10-10-16: add more posture exs as only cervical retraction given for HEP; continue high level balance and gait   PT Home Exercise Plan SLS, tandem stance, heel cord stretch, dorsiflexion strengthening (pt states she has bands), cervical retraction, heel raises   Consulted and Agree with Plan of Care Patient      Patient will benefit from skilled therapeutic intervention in order to improve the following deficits and impairments:  Abnormal gait, Decreased balance, Decreased mobility, Decreased strength, Postural dysfunction, Difficulty walking  Visit Diagnosis: Other abnormalities of gait and mobility  Muscle weakness (generalized)     Problem List Patient Active Problem List   Diagnosis Date Noted  . Hyperglycemia 08/24/2016  . Poor balance 08/24/2016  . Open wound of head 08/07/2016  . Bursitis of right knee 08/07/2016  . Mild concussion 08/07/2016  . Herpes zoster 06/02/2016  . Upper airway cough syndrome 06/01/2016  . Asthma with exacerbation 05/30/2016  . Cough 05/30/2016  . Hyperlipidemia 03/22/2016  . Choking 03/15/2016  . GERD (gastroesophageal reflux disease) 03/15/2016  . Graves disease 01/04/2016  . Osteopenia, severe 10/14/2015  . Other constipation 09/01/2015  . Incisional hernia 07/08/2015  . Ventral hernia without obstruction or gangrene 05/12/2015  . Lower back pain 05/12/2015  . Spinal stenosis in cervical region 11/26/2013  . Depression 08/05/2008  . Personal history of venous thrombosis and embolism 08/05/2008  .  Chronic rhinitis 12/29/2007  . Asthma 12/29/2007    Alda Lea, PT 10/10/2016, 8:15 PM  [facility] [address] [place], [place], [address] Phone: [phone]   Fax:  [phone]  Name: CARNELIA OSCAR MRN: [id number] Date of Birth: May 15, 1932

## 2016-10-12 ENCOUNTER — Ambulatory Visit: Payer: Medicare Other | Admitting: Physical Therapy

## 2016-10-12 DIAGNOSIS — M6281 Muscle weakness (generalized): Secondary | ICD-10-CM | POA: Diagnosis not present

## 2016-10-12 DIAGNOSIS — R2689 Other abnormalities of gait and mobility: Secondary | ICD-10-CM

## 2016-10-12 NOTE — Therapy (Signed)
[facility] [address] [place], [place], [address] Phone: [phone]   Fax:  [phone]  Physical Therapy Treatment  Patient Details  Name: Faith Edwards MRN: [id number] Date of Birth: Sep 22, 1932 Referring Provider: Billey Gosling, MD  Encounter Date: 10/12/2016      PT End of Session - 10/12/16 1240    Visit Number 3   Number of Visits 8   PT Start Time 6962   PT Stop Time 1230   PT Time Calculation (min) 44 min      Past Medical History:  Diagnosis Date  . Anxiety   . Arthritis   . Asthma   . Bronchitis    hx of   . [facility]    [facility]  . Cystitis    1954  . Depression   . Ear pain    bilat   . History of blood transfusion    2010  . Hx of blood clots 2007 & 2010   post TKR  . Hyperthyroidism   . Pelvic fracture (HCC)    hx of in three places secondary to fall   . Pneumonia    hx of 1952  . Pulmonary embolism ([place]) 1993   on HRT  . Right wrist fracture    hx of 1995 has plate secondary to fall   . Shaking    hands bilat  . Shortness of breath dyspnea   . Skin cancer    basal cell   . Spinal stenosis   . Trauma    left lower leg 10/22/2014   . Urinary frequency     Past Surgical History:  Procedure Laterality Date  . ABDOMINAL HYSTERECTOMY    . BREAST ENHANCEMENT SURGERY  1985  . CHOLECYSTECTOMY    . COLONOSCOPY     diverticulosis  . ESI   11/2013    C7-T1 ; Dr Nelva Bush  . EYE SURGERY     bilat cataract surgery   . INSERTION OF MESH N/A 07/08/2015   Procedure: INSERTION OF MESH;  Surgeon: Armandina Gemma, MD;  Location: [facility];  Service: General;  Laterality: N/A;  . REPLACEMENT TOTAL KNEE BILATERAL  2007 & 2010  . SP FACET INJECTION  01/19/14   RC2-3,3-4,C4-5  . TONSILLECTOMY    . VENTRAL HERNIA REPAIR N/A 07/08/2015   Procedure: VENTRAL ADULT REPAIR VENTRAL INCISIONAL HERNIA REPAIR;  Surgeon: Armandina Gemma, MD;  Location: [facility];  Service: General;  Laterality: N/A;     There were no vitals filed for this visit.      Subjective Assessment - 10/12/16 1239    Currently in Pain? No/denies          Neuromuscular re-ed Active standing with back against wall; multi bouts; with education on posture re-ed and vestibular component of her forward head and eyes needing to be horizontal; with active stance; had her move head side to side;  Ambulating between active stance bouts; vq's for posture and head mvt side to side identifying targets;   There ex Calf stretch off step 30 sec hold  5 reps each foot                        PT Education - 10/12/16 1240    Education provided Yes   Education Details posutre re-ed and calf stretching; using wall for active standing    Person(s) Educated Patient   Methods Demonstration   Comprehension Returned demonstration  PT Long Term Goals - 10/05/16 1942      PT LONG TERM GOAL #1   Title The patient will be indep with HEP for for posture, ankle strengthening, balance and general conditioning.   Baseline Target date 11/05/2016   Time 4   Period Weeks     PT LONG TERM GOAL #2   Title The patient will improve ABC score from 26.9% to > or equal to 38% to demo improved confidence with balance.   Baseline Target date 11/05/2016   Time 4   Period Weeks     PT LONG TERM GOAL #3   Title The patient wil reduce TUG score from 15.13 seconds to < or equal to 13.5 seconds to demo dec'd risk for falls.   Baseline Target date 11/05/2016   Time 4   Period Weeks     PT LONG TERM GOAL #4   Title The patient will improve gait speed from 2.27 ft/sec to > or equal to 2.62 ft/sec to demo transition to "full community ambulator" classification of gait.   Baseline Target date 11/05/2016   Time 4   Period Weeks     PT LONG TERM GOAL #5   Title The patient will improve Berg from 45/56 to > or equal to 49/56 to demo dec'd risk for falls.   Baseline Target date 11/05/2016   Time 4   Period  Weeks     Additional Long Term Goals   Additional Long Term Goals Yes     PT LONG TERM GOAL #6   Title The patient will negotiate 4 steps with one handrail modified indep using reciprocal pattern.   Baseline Target date 11/05/2016   Time 4   Period Weeks               Plan - 10/12/16 1241    Clinical Impression Statement Pt was able to achieve a near neurtral posture standing against the wall; c/o calf pain which improved after doing calf stretch; by end of session she was able to walk with near neutral posture; she will need to practice this a lot to get out of the flexed posture she is in now. should help her headaches as well; she was able to walk and turn head-disassociating head and body mvt. w/o loss of balance.;    Rehab Potential Good   PT Frequency 2x / week   PT Duration 4 weeks   PT Treatment/Interventions ADLs/Self Care Home Management;Neuromuscular re-education;Therapeutic activities;Therapeutic exercise;Balance training;Gait training;Functional mobility training;DME Instruction;Patient/family education   PT Next Visit Plan progress dynamic balance ex's with corrected posture; review HEP and advance as able      Patient will benefit from skilled therapeutic intervention in order to improve the following deficits and impairments:  Abnormal gait, Decreased balance, Decreased mobility, Decreased strength, Postural dysfunction, Difficulty walking  Visit Diagnosis: Other abnormalities of gait and mobility  Muscle weakness (generalized)     Problem List Patient Active Problem List   Diagnosis Date Noted  . Hyperglycemia 08/24/2016  . Poor balance 08/24/2016  . Open wound of head 08/07/2016  . Bursitis of right knee 08/07/2016  . Mild concussion 08/07/2016  . Herpes zoster 06/02/2016  . Upper airway cough syndrome 06/01/2016  . Asthma with exacerbation 05/30/2016  . Cough 05/30/2016  . Hyperlipidemia 03/22/2016  . Choking 03/15/2016  . GERD (gastroesophageal  reflux disease) 03/15/2016  . Graves disease 01/04/2016  . Osteopenia, severe 10/14/2015  . Other constipation 09/01/2015  . Incisional hernia 07/08/2015  .  Ventral hernia without obstruction or gangrene 05/12/2015  . Lower back pain 05/12/2015  . Spinal stenosis in cervical region 11/26/2013  . Depression 08/05/2008  . Personal history of venous thrombosis and embolism 08/05/2008  . Chronic rhinitis 12/29/2007  . Asthma 12/29/2007    Rosaura Carpenter D PT DPT  10/12/2016, 12:46 PM  [facility] [address] [place], [place], [address] Phone: [phone]   Fax:  [phone]  Name: Faith Edwards MRN: [id number] Date of Birth: 02-12-1932

## 2016-10-15 ENCOUNTER — Ambulatory Visit: Payer: Medicare Other | Admitting: Physical Therapy

## 2016-10-15 DIAGNOSIS — R2689 Other abnormalities of gait and mobility: Secondary | ICD-10-CM | POA: Diagnosis not present

## 2016-10-15 DIAGNOSIS — M6281 Muscle weakness (generalized): Secondary | ICD-10-CM

## 2016-10-15 NOTE — Patient Instructions (Addendum)
Compensatory Strategies: Corrective Saccades    1. Holding two stationary targets placed ___18_ inches apart, move eyes to target, keep head still. 2. Then move head in direction of target while eyes remain on target. 3/4. Repeat in opposite direction. Perform sitting. Repeat sequence __20__ times per session. Do ___2_ sessions per day.  When this is easy  ; do it in standing; then feet together; then standing tandem then on one foot  Copyright  VHI. All rights reserved.  Gaze Stabilization: Standing Feet Heel-Toe "Tandem"    Feet in full heel-toe position, keeping eyes on target on wall _10___ feet away, tilt head down 15-30 and move head side to side for __30__ seconds. Repeat while moving head up and down for __30__ seconds. Do ___2_ sessions per day. Repeat using target on pattern background.  Copyright  VHI. All rights reserved.  Four Square Stepping    Stand in right front square of four square pattern. Move clockwise, stepping from one square to next. Keep facing forward.  Then do counter clockwise Repeat __10__ times per session. Do ____ sessions per day.  Copyright  VHI. All rights reserved.

## 2016-10-15 NOTE — Therapy (Signed)
[facility] [address] [place], [place], [address] Phone: [phone]   Fax:  [phone]  Physical Therapy Treatment  Patient Details  Name: Faith Edwards MRN: [id number] Date of Birth: 08/09/1932 Referring Provider: Billey Gosling, MD  Encounter Date: 10/15/2016      PT End of Session - 10/15/16 0925    Visit Number 4   Number of Visits 8   Date for PT Re-Evaluation 11/04/16   Authorization Type G code every 10th visit   PT Start Time 0850   PT Stop Time 0930   PT Time Calculation (min) 40 min   Activity Tolerance Patient tolerated treatment well   Behavior During Therapy [facility] for tasks assessed/performed      Past Medical History:  Diagnosis Date  . Anxiety   . Arthritis   . Asthma   . Bronchitis    hx of   . Chronic headaches    [facility]  . Cystitis    1954  . Depression   . Ear pain    bilat   . History of blood transfusion    2010  . Hx of blood clots 2007 & 2010   post TKR  . Hyperthyroidism   . Pelvic fracture (HCC)    hx of in three places secondary to fall   . Pneumonia    hx of 1952  . Pulmonary embolism ([place]) 1993   on HRT  . Right wrist fracture    hx of 1995 has plate secondary to fall   . Shaking    hands bilat  . Shortness of breath dyspnea   . Skin cancer    basal cell   . Spinal stenosis   . Trauma    left lower leg 10/22/2014   . Urinary frequency     Past Surgical History:  Procedure Laterality Date  . ABDOMINAL HYSTERECTOMY    . BREAST ENHANCEMENT SURGERY  1985  . CHOLECYSTECTOMY    . COLONOSCOPY     diverticulosis  . ESI   11/2013    C7-T1 ; Dr Nelva Bush  . EYE SURGERY     bilat cataract surgery   . INSERTION OF MESH N/A 07/08/2015   Procedure: INSERTION OF MESH;  Surgeon: Armandina Gemma, MD;  Location: [facility];  Service: General;  Laterality: N/A;  . REPLACEMENT TOTAL KNEE BILATERAL  2007 & 2010  . SP FACET INJECTION  01/19/14   RC2-3,3-4,C4-5  .  TONSILLECTOMY    . VENTRAL HERNIA REPAIR N/A 07/08/2015   Procedure: VENTRAL ADULT REPAIR VENTRAL INCISIONAL HERNIA REPAIR;  Surgeon: Armandina Gemma, MD;  Location: [facility];  Service: General;  Laterality: N/A;    There were no vitals filed for this visit.      Subjective Assessment - 10/15/16 0854    Subjective Pt stated that after last visit she has been very aware of her posture and her lower back is feeling better; stated her balance is about the same; had a bout of dizziness over the weekend; denies vertigo; describe as fullness in her head;    Currently in Pain? No/denies                 neruomuscular re ed   gaze stabilization ex;s and step reflex ex's ; see the HEP we set up today;  Focus  On disassociation of head , eye , body mvt;  PT Education - 10/15/16 0924    Education provided Yes   Education Details reviewed posture re-ed; added stepping and stanidng ex's with emphasis on disassociation of  head and eye mvt   Person(s) Educated Patient   Methods Handout;Demonstration   Comprehension Verbalized understanding;Returned demonstration             PT Long Term Goals - 10/05/16 1942      PT LONG TERM GOAL #1   Title The patient will be indep with HEP for for posture, ankle strengthening, balance and general conditioning.   Baseline Target date 11/05/2016   Time 4   Period Weeks     PT LONG TERM GOAL #2   Title The patient will improve ABC score from 26.9% to > or equal to 38% to demo improved confidence with balance.   Baseline Target date 11/05/2016   Time 4   Period Weeks     PT LONG TERM GOAL #3   Title The patient wil reduce TUG score from 15.13 seconds to < or equal to 13.5 seconds to demo dec'd risk for falls.   Baseline Target date 11/05/2016   Time 4   Period Weeks     PT LONG TERM GOAL #4   Title The patient will improve gait speed from 2.27 ft/sec to > or equal to 2.62 ft/sec to demo transition to "full  community ambulator" classification of gait.   Baseline Target date 11/05/2016   Time 4   Period Weeks     PT LONG TERM GOAL #5   Title The patient will improve Berg from 45/56 to > or equal to 49/56 to demo dec'd risk for falls.   Baseline Target date 11/05/2016   Time 4   Period Weeks     Additional Long Term Goals   Additional Long Term Goals Yes     PT LONG TERM GOAL #6   Title The patient will negotiate 4 steps with one handrail modified indep using reciprocal pattern.   Baseline Target date 11/05/2016   Time 4   Period Weeks               Plan - 10/15/16 5643    Clinical Impression Statement Pt demo saccades with disassociation of head and eye mvt; improved as she worked through the ex's;  difficult for her to stand tandem and move head; she did well with the 4 square step ex's;  she has hx of doing tai chi and this was relavent to her   PT Frequency 2x / week   PT Duration 4 weeks   PT Next Visit Plan progress dynamic balance ex's with corrected posture; review HEP and advance as able      Patient will benefit from skilled therapeutic intervention in order to improve the following deficits and impairments:  Abnormal gait, Decreased balance, Decreased mobility, Decreased strength, Postural dysfunction, Difficulty walking  Visit Diagnosis: Other abnormalities of gait and mobility  Muscle weakness (generalized)     Problem List Patient Active Problem List   Diagnosis Date Noted  . Hyperglycemia 08/24/2016  . Poor balance 08/24/2016  . Open wound of head 08/07/2016  . Bursitis of right knee 08/07/2016  . Mild concussion 08/07/2016  . Herpes zoster 06/02/2016  . Upper airway cough syndrome 06/01/2016  . Asthma with exacerbation 05/30/2016  . Cough 05/30/2016  . Hyperlipidemia 03/22/2016  . Choking 03/15/2016  . GERD (gastroesophageal reflux disease) 03/15/2016  . Graves disease 01/04/2016  . Osteopenia, severe 10/14/2015  .  Other constipation 09/01/2015   . Incisional hernia 07/08/2015  . Ventral hernia without obstruction or gangrene 05/12/2015  . Lower back pain 05/12/2015  . Spinal stenosis in cervical region 11/26/2013  . Depression 08/05/2008  . Personal history of venous thrombosis and embolism 08/05/2008  . Chronic rhinitis 12/29/2007  . Asthma 12/29/2007    Rosaura Carpenter D  PT DPT  10/15/2016, 9:31 AM  [facility] [address] [place], [place], [address] Phone: [phone]   Fax:  [phone]  Name: Faith Edwards MRN: [id number] Date of Birth: Aug 13, 1932

## 2016-10-19 ENCOUNTER — Ambulatory Visit: Payer: Medicare Other | Admitting: Rehabilitative and Restorative Service Providers"

## 2016-10-19 DIAGNOSIS — H5202 Hypermetropia, left eye: Secondary | ICD-10-CM | POA: Diagnosis not present

## 2016-10-19 DIAGNOSIS — R2689 Other abnormalities of gait and mobility: Secondary | ICD-10-CM | POA: Diagnosis not present

## 2016-10-19 DIAGNOSIS — Z961 Presence of intraocular lens: Secondary | ICD-10-CM | POA: Diagnosis not present

## 2016-10-19 DIAGNOSIS — M6281 Muscle weakness (generalized): Secondary | ICD-10-CM

## 2016-10-19 DIAGNOSIS — H1859 Other hereditary corneal dystrophies: Secondary | ICD-10-CM | POA: Diagnosis not present

## 2016-10-19 DIAGNOSIS — H52223 Regular astigmatism, bilateral: Secondary | ICD-10-CM | POA: Diagnosis not present

## 2016-10-19 DIAGNOSIS — H524 Presbyopia: Secondary | ICD-10-CM | POA: Diagnosis not present

## 2016-10-19 NOTE — Therapy (Signed)
[facility] [address] [place], [place], [address] Phone: [phone]   Fax:  [phone]  Physical Therapy Treatment  Patient Details  Name: Faith Edwards MRN: [id number] Date of Birth: 1932/01/21 Referring Provider: Billey Gosling, MD  Encounter Date: 10/19/2016      PT End of Session - 10/19/16 1438    Visit Number 5   Number of Visits 8   Date for PT Re-Evaluation 11/04/16   Authorization Type G code every 10th visit   PT Start Time 1413   PT Stop Time 1453   PT Time Calculation (min) 40 min   Equipment Utilized During Treatment Gait belt   Activity Tolerance Patient tolerated treatment well   Behavior During Therapy [facility] for tasks assessed/performed      Past Medical History:  Diagnosis Date  . Anxiety   . Arthritis   . Asthma   . Bronchitis    hx of   . Chronic headaches    [facility]  . Cystitis    1954  . Depression   . Ear pain    bilat   . History of blood transfusion    2010  . Hx of blood clots 2007 & 2010   post TKR  . Hyperthyroidism   . Pelvic fracture (HCC)    hx of in three places secondary to fall   . Pneumonia    hx of 1952  . Pulmonary embolism ([place]) 1993   on HRT  . Right wrist fracture    hx of 1995 has plate secondary to fall   . Shaking    hands bilat  . Shortness of breath dyspnea   . Skin cancer    basal cell   . Spinal stenosis   . Trauma    left lower leg 10/22/2014   . Urinary frequency     Past Surgical History:  Procedure Laterality Date  . ABDOMINAL HYSTERECTOMY    . BREAST ENHANCEMENT SURGERY  1985  . CHOLECYSTECTOMY    . COLONOSCOPY     diverticulosis  . ESI   11/2013    C7-T1 ; Dr Nelva Bush  . EYE SURGERY     bilat cataract surgery   . INSERTION OF MESH N/A 07/08/2015   Procedure: INSERTION OF MESH;  Surgeon: Armandina Gemma, MD;  Location: [facility];  Service: General;  Laterality: N/A;  . REPLACEMENT TOTAL KNEE BILATERAL  2007 & 2010  . SP FACET  INJECTION  01/19/14   RC2-3,3-4,C4-5  . TONSILLECTOMY    . VENTRAL HERNIA REPAIR N/A 07/08/2015   Procedure: VENTRAL ADULT REPAIR VENTRAL INCISIONAL HERNIA REPAIR;  Surgeon: Armandina Gemma, MD;  Location: [facility];  Service: General;  Laterality: N/A;    There were no vitals filed for this visit.      Subjective Assessment - 10/19/16 1437    Subjective The patient feels that emphasis on posture over prior 2 sessions was helpful.    Patient Stated Goals "I hope I can gain confidence in walking."   Start back to tai chi.   Currently in Pain? Yes   Pain Score --  minimal   Pain Location Shoulder  from trying to hold posture upright   Pain Orientation Right;Left   Pain Descriptors / Indicators Aching   Pain Type Acute pain   Pain Onset 1 to 4 weeks ago   Pain Frequency Constant   Aggravating Factors  PT to monitor, no goals to follow  Gait: Ambulation without device with cues on arm swing (tactile), toe walking, marching, forward/backwards walking with CGA and postural cues Treadmill with bilateral UE support x 3 minutes with tactile, verbal and demonstration cues for longer stride length at 1.2 mph with UE support and CGA Gait with direction changes and multi-tasking with ball toss and CGA for safety x 300 ft  NEUROMUSCULAR RE-EDUCATION: Standing balance activities marching, sidestepping with CGA for safety Sit<>stand emphasizing postural re-ed  THERAPEUTIC EXERCISE: Self mobilization thoracic spine lying on towel roll x 2 minutes  Overhead supine reaching for bilateral shoulders with A/AROM Heel raises x 10 reps with mirror for cues to maintain ankle positioning Sitting scapular retraction x 10 reps without resistance due to L wrist injury/post surgical          PT Long Term Goals - 10/19/16 1444      PT LONG TERM GOAL #1   Title The patient will be indep with HEP for for posture, ankle strengthening, balance and general conditioning.   Baseline Target date 11/05/2016    Time 4   Period Weeks     PT LONG TERM GOAL #2   Title The patient will improve ABC score from 26.9% to > or equal to 38% to demo improved confidence with balance.   Baseline Target date 11/05/2016   Time 4   Period Weeks     PT LONG TERM GOAL #3   Title The patient wil reduce TUG score from 15.13 seconds to < or equal to 13.5 seconds to demo dec'd risk for falls.   Baseline Target date 11/05/2016   Time 4   Period Weeks     PT LONG TERM GOAL #4   Title The patient will improve gait speed from 2.27 ft/sec to > or equal to 2.62 ft/sec to demo transition to "full community ambulator" classification of gait.   Baseline Target date 11/05/2016   Time 4   Period Weeks     PT LONG TERM GOAL #5   Title The patient will improve Berg from 45/56 to > or equal to 49/56 to demo dec'd risk for falls.   Baseline Target date 11/05/2016   Time 4   Period Weeks     PT LONG TERM GOAL #6   Title The patient will negotiate 4 steps with one handrail modified indep using reciprocal pattern.   Baseline Met on 10/20/2016   Time 4   Period Weeks   Status Achieved               Plan - 10/19/16 1500    Clinical Impression Statement The patient met LTG for stair negotiation.  She is demonstrating increased postural awareness, improved consistency with foot clearance during gait, improved functional mobility.  PT to continue progressing to LTGs.    PT Treatment/Interventions ADLs/Self Care Home Management;Neuromuscular re-education;Therapeutic activities;Therapeutic exercise;Balance training;Gait training;Functional mobility training;DME Instruction;Patient/family education   PT Next Visit Plan dynamic balance/gait, posture, ankle strengthening   Consulted and Agree with Plan of Care Patient      Patient will benefit from skilled therapeutic intervention in order to improve the following deficits and impairments:  Abnormal gait, Decreased balance, Decreased mobility, Decreased strength, Postural  dysfunction, Difficulty walking  Visit Diagnosis: Other abnormalities of gait and mobility  Muscle weakness (generalized)     Problem List Patient Active Problem List   Diagnosis Date Noted  . Hyperglycemia 08/24/2016  . Poor balance 08/24/2016  . Open wound of head 08/07/2016  . Bursitis of  right knee 08/07/2016  . Mild concussion 08/07/2016  . Herpes zoster 06/02/2016  . Upper airway cough syndrome 06/01/2016  . Asthma with exacerbation 05/30/2016  . Cough 05/30/2016  . Hyperlipidemia 03/22/2016  . Choking 03/15/2016  . GERD (gastroesophageal reflux disease) 03/15/2016  . Graves disease 01/04/2016  . Osteopenia, severe 10/14/2015  . Other constipation 09/01/2015  . Incisional hernia 07/08/2015  . Ventral hernia without obstruction or gangrene 05/12/2015  . Lower back pain 05/12/2015  . Spinal stenosis in cervical region 11/26/2013  . Depression 08/05/2008  . Personal history of venous thrombosis and embolism 08/05/2008  . Chronic rhinitis 12/29/2007  . Asthma 12/29/2007    [place], PT 10/19/2016, 3:02 PM  [facility] [address] [place], [place], [address] Phone: [phone]   Fax:  [phone]  Name: KIMBERLLY NORGARD MRN: [id number] Date of Birth: 1932/07/03

## 2016-10-26 ENCOUNTER — Ambulatory Visit: Payer: Medicare Other | Admitting: Rehabilitative and Restorative Service Providers"

## 2016-10-29 ENCOUNTER — Ambulatory Visit: Payer: Medicare Other | Admitting: Internal Medicine

## 2016-10-30 ENCOUNTER — Ambulatory Visit: Payer: Medicare Other | Admitting: Rehabilitative and Restorative Service Providers"

## 2016-10-30 DIAGNOSIS — J019 Acute sinusitis, unspecified: Secondary | ICD-10-CM | POA: Diagnosis not present

## 2016-11-01 ENCOUNTER — Ambulatory Visit: Payer: Medicare Other | Attending: Internal Medicine | Admitting: Rehabilitative and Restorative Service Providers"

## 2016-11-01 DIAGNOSIS — R2689 Other abnormalities of gait and mobility: Secondary | ICD-10-CM | POA: Diagnosis not present

## 2016-11-01 DIAGNOSIS — M6281 Muscle weakness (generalized): Secondary | ICD-10-CM | POA: Insufficient documentation

## 2016-11-01 NOTE — Therapy (Signed)
[facility] [address] [place], [place], [address] Phone: [phone]   Fax:  [phone]  Physical Therapy Treatment  Patient Details  Name: Faith Edwards MRN: [id number] Date of Birth: 1932/08/12 Referring Provider: Billey Gosling, MD  Encounter Date: 11/01/2016      PT End of Session - 11/01/16 1328    Visit Number 6   Number of Visits 8   Date for PT Re-Evaluation 11/04/16   Authorization Type G code every 10th visit   PT Start Time 1322   PT Stop Time 1402   PT Time Calculation (min) 40 min   Equipment Utilized During Treatment Gait belt   Activity Tolerance Patient tolerated treatment well   Behavior During Therapy [facility] for tasks assessed/performed      Past Medical History:  Diagnosis Date  . Anxiety   . Arthritis   . Asthma   . Bronchitis    hx of   . Chronic headaches    [facility]  . Cystitis    1954  . Depression   . Ear pain    bilat   . History of blood transfusion    2010  . Hx of blood clots 2007 & 2010   post TKR  . Hyperthyroidism   . Pelvic fracture (HCC)    hx of in three places secondary to fall   . Pneumonia    hx of 1952  . Pulmonary embolism ([place]) 1993   on HRT  . Right wrist fracture    hx of 1995 has plate secondary to fall   . Shaking    hands bilat  . Shortness of breath dyspnea   . Skin cancer    basal cell   . Spinal stenosis   . Trauma    left lower leg 10/22/2014   . Urinary frequency     Past Surgical History:  Procedure Laterality Date  . ABDOMINAL HYSTERECTOMY    . BREAST ENHANCEMENT SURGERY  1985  . CHOLECYSTECTOMY    . COLONOSCOPY     diverticulosis  . ESI   11/2013    C7-T1 ; Dr Nelva Bush  . EYE SURGERY     bilat cataract surgery   . INSERTION OF MESH N/A 07/08/2015   Procedure: INSERTION OF MESH;  Surgeon: Armandina Gemma, MD;  Location: [facility];  Service: General;  Laterality: N/A;  . REPLACEMENT TOTAL KNEE BILATERAL  2007 & 2010  . SP FACET  INJECTION  01/19/14   RC2-3,3-4,C4-5  . TONSILLECTOMY    . VENTRAL HERNIA REPAIR N/A 07/08/2015   Procedure: VENTRAL ADULT REPAIR VENTRAL INCISIONAL HERNIA REPAIR;  Surgeon: Armandina Gemma, MD;  Location: [facility];  Service: General;  Laterality: N/A;    There were no vitals filed for this visit.      Subjective Assessment - 11/01/16 1325    Subjective The patient reports that she has a sinus infection that is making her have vertigo.  She saw Dr. Redmond Baseman on Tuesday and was prescribed an anti-histamine and antibiotic.  When she experiences sinus infection, she notes her balance is off and she has difficulty walking around.  She also inquires about "why is my hand getting numb that I had surgery on?".  She saw Dr. Burney Gauze and is planning to see him again.  She reports the hand goes completely numb.    Patient Stated Goals "I hope I can gain confidence in walking."   Start back to tai chi.   Currently in  Pain? Yes  migraine this morning            [facility] PT Assessment - 11/01/16 1404      Berg Balance Test   Sit to Stand Able to stand without using hands and stabilize independently   Standing Unsupported Able to stand safely 2 minutes   Sitting with Back Unsupported but Feet Supported on Floor or Stool Able to sit safely and securely 2 minutes   Stand to Sit Sits safely with minimal use of hands   Transfers Able to transfer safely, minor use of hands   Standing Unsupported with Eyes Closed Able to stand 10 seconds safely   Standing Ubsupported with Feet Together Able to place feet together independently and stand 1 minute safely   From Standing, Reach Forward with Outstretched Arm Can reach forward >12 cm safely (5")   From Standing Position, Pick up Object from Floor Able to pick up shoe safely and easily   From Standing Position, Turn to Look Behind Over each Shoulder Looks behind one side only/other side shows less weight shift   Turn 360 Degrees Able to turn 360 degrees safely but slowly    Standing Unsupported, Alternately Place Feet on Step/Stool Able to stand independently and safely and complete 8 steps in 20 seconds   Standing Unsupported, One Foot in Front Able to plae foot ahead of the other independently and hold 30 seconds   Standing on One Leg Able to lift leg independently and hold 5-10 seconds   Total Score 50   Berg comment: 50/56 improved from 45/56 at eval     NEUROMUSCULAR RE-EDUCATION: Berg=50/56 Alternating foot taps to 6" and 12" surfaces Sidestepping with UEs overhead (to R shoulder tolerance) for postural/core stability  THERAPEUTIC EXERCISE: Postural stretch supine with thoracic self extension over towel roll Shoulder rolls x 10 reps Heel raises x 10 reps without UE support Toe raises in standing with minimal UE support  Gait: Emphasized longer stride length, arm swing, and increased pace  Forward/backward walking; direction changes with gait      PT Long Term Goals - 11/01/16 2056      PT LONG TERM GOAL #1   Title The patient will be indep with HEP for for posture, ankle strengthening, balance and general conditioning.   Baseline Target date 11/05/2016   Time 4   Period Weeks   Status On-going     PT LONG TERM GOAL #2   Title The patient will improve ABC score from 26.9% to > or equal to 38% to demo improved confidence with balance.   Baseline Target date 11/05/2016   Time 4   Period Weeks   Status On-going     PT LONG TERM GOAL #3   Title The patient wil reduce TUG score from 15.13 seconds to < or equal to 13.5 seconds to demo dec'd risk for falls.   Baseline Target date 11/05/2016   Time 4   Period Weeks   Status On-going     PT LONG TERM GOAL #4   Title The patient will improve gait speed from 2.27 ft/sec to > or equal to 2.62 ft/sec to demo transition to "full community ambulator" classification of gait.   Baseline Target date 11/05/2016   Time 4   Period Weeks   Status On-going     PT LONG TERM GOAL #5   Title The patient  will improve Berg from 45/56 to > or equal to 49/56 to demo dec'd risk for falls.  Baseline Met on 11/01/2016   Time 4   Period Weeks   Status Achieved     PT LONG TERM GOAL #6   Title The patient will negotiate 4 steps with one handrail modified indep using reciprocal pattern.   Baseline Met on 10/20/2016   Time 4   Period Weeks   Status Achieved               Plan - 11/01/16 2056    Clinical Impression Statement The patient has met 2 LTGs- today she met LTG for Berg balance score improving from 45/56 up to 50/56.  The patient is progressing to other LTGs.  PT to check remaining LTGs next week, ensure return to community exercise and discharge as appropriate.    PT Treatment/Interventions ADLs/Self Care Home Management;Neuromuscular re-education;Therapeutic activities;Therapeutic exercise;Balance training;Gait training;Functional mobility training;DME Instruction;Patient/family education   PT Next Visit Plan Check LTGs, review HEP, discuss return to community activities   Consulted and Agree with Plan of Care Patient      Patient will benefit from skilled therapeutic intervention in order to improve the following deficits and impairments:  Abnormal gait, Decreased balance, Decreased mobility, Decreased strength, Postural dysfunction, Difficulty walking  Visit Diagnosis: Other abnormalities of gait and mobility  Muscle weakness (generalized)     Problem List Patient Active Problem List   Diagnosis Date Noted  . Hyperglycemia 08/24/2016  . Poor balance 08/24/2016  . Open wound of head 08/07/2016  . Bursitis of right knee 08/07/2016  . Mild concussion 08/07/2016  . Herpes zoster 06/02/2016  . Upper airway cough syndrome 06/01/2016  . Asthma with exacerbation 05/30/2016  . Cough 05/30/2016  . Hyperlipidemia 03/22/2016  . Choking 03/15/2016  . GERD (gastroesophageal reflux disease) 03/15/2016  . Graves disease 01/04/2016  . Osteopenia, severe 10/14/2015  . Other  constipation 09/01/2015  . Incisional hernia 07/08/2015  . Ventral hernia without obstruction or gangrene 05/12/2015  . Lower back pain 05/12/2015  . Spinal stenosis in cervical region 11/26/2013  . Depression 08/05/2008  . Personal history of venous thrombosis and embolism 08/05/2008  . Chronic rhinitis 12/29/2007  . Asthma 12/29/2007    [place], PT 11/01/2016, 8:58 PM  [facility] [address] [place], [place], [address] Phone: [phone]   Fax:  [phone]  Name: Faith Edwards MRN: [id number] Date of Birth: Sep 05, 1932

## 2016-11-02 ENCOUNTER — Other Ambulatory Visit (INDEPENDENT_AMBULATORY_CARE_PROVIDER_SITE_OTHER): Payer: Medicare Other

## 2016-11-02 DIAGNOSIS — E785 Hyperlipidemia, unspecified: Secondary | ICD-10-CM

## 2016-11-02 LAB — LIPID PANEL
CHOLESTEROL: 171 mg/dL (ref 0–200)
HDL: 69.3 mg/dL (ref >39.00)
LDL Cholesterol: 82 mg/dL (ref 0–99)
NonHDL: 101.51
Total CHOL/HDL Ratio: 2
Triglycerides: 100 mg/dL (ref 0.0–149.0)
VLDL: 20 mg/dL (ref 0.0–40.0)

## 2016-11-02 LAB — COMPREHENSIVE METABOLIC PANEL
ALBUMIN: 4.2 g/dL (ref 3.5–5.2)
ALK PHOS: 106 U/L (ref 39–117)
ALT: 16 U/L (ref 0–35)
AST: 17 U/L (ref 0–37)
BILIRUBIN TOTAL: 0.8 mg/dL (ref 0.2–1.2)
BUN: 24 mg/dL — AB (ref 6–23)
CO2: 28 mEq/L (ref 19–32)
Calcium: 9.6 mg/dL (ref 8.4–10.5)
Chloride: 103 mEq/L (ref 96–112)
Creatinine, Ser: 1.47 mg/dL — ABNORMAL HIGH (ref 0.40–1.20)
GFR: 36.1 mL/min — ABNORMAL LOW (ref >60.00)
GLUCOSE: 108 mg/dL — AB (ref 70–99)
POTASSIUM: 4.4 meq/L (ref 3.5–5.1)
SODIUM: 139 meq/L (ref 135–145)
TOTAL PROTEIN: 6.8 g/dL (ref 6.0–8.3)

## 2016-11-05 ENCOUNTER — Ambulatory Visit: Payer: Medicare Other | Admitting: Rehabilitative and Restorative Service Providers"

## 2016-11-06 ENCOUNTER — Other Ambulatory Visit: Payer: Self-pay | Admitting: Emergency Medicine

## 2016-11-06 ENCOUNTER — Other Ambulatory Visit (INDEPENDENT_AMBULATORY_CARE_PROVIDER_SITE_OTHER): Payer: Medicare Other

## 2016-11-06 DIAGNOSIS — N289 Disorder of kidney and ureter, unspecified: Secondary | ICD-10-CM | POA: Diagnosis not present

## 2016-11-06 LAB — BASIC METABOLIC PANEL
BUN: 21 mg/dL (ref 6–23)
CHLORIDE: 104 meq/L (ref 96–112)
CO2: 28 meq/L (ref 19–32)
CREATININE: 1.25 mg/dL — AB (ref 0.40–1.20)
Calcium: 9.7 mg/dL (ref 8.4–10.5)
GFR: 43.53 mL/min — ABNORMAL LOW (ref >60.00)
GLUCOSE: 88 mg/dL (ref 70–99)
Potassium: 4.6 mEq/L (ref 3.5–5.1)
Sodium: 137 mEq/L (ref 135–145)

## 2016-11-07 ENCOUNTER — Ambulatory Visit: Payer: Medicare Other | Admitting: Rehabilitative and Restorative Service Providers"

## 2016-11-08 ENCOUNTER — Other Ambulatory Visit: Payer: Self-pay | Admitting: Internal Medicine

## 2016-11-08 DIAGNOSIS — N183 Chronic kidney disease, stage 3 unspecified: Secondary | ICD-10-CM

## 2016-11-12 DIAGNOSIS — F064 Anxiety disorder due to known physiological condition: Secondary | ICD-10-CM | POA: Diagnosis not present

## 2016-11-20 ENCOUNTER — Ambulatory Visit: Payer: Medicare Other | Admitting: Rehabilitative and Restorative Service Providers"

## 2016-11-23 ENCOUNTER — Other Ambulatory Visit: Payer: Self-pay | Admitting: Internal Medicine

## 2016-12-14 ENCOUNTER — Encounter: Payer: Self-pay | Admitting: Rehabilitative and Restorative Service Providers"

## 2016-12-14 NOTE — Therapy (Signed)
[facility] [address] [place], [place], [address] Phone: [phone]   Fax:  [phone]  Patient Details  Name: Faith Edwards MRN: [id number] Date of Birth: [birth year] Referring Provider:  Billey Gosling, MD  Encounter Date: last encounter 11/01/2016  PHYSICAL THERAPY DISCHARGE SUMMARY  Visits from Start of Care: 6  Current functional level related to goals / functional outcomes:  Goals not reassessed as patient cancelled due to illness and has not returned.  See note from 11/01/16 for patient status at time of d/c.     PT Long Term Goals - 11/01/16 2056      PT LONG TERM GOAL #1   Title The patient will be indep with HEP for for posture, ankle strengthening, balance and general conditioning.   Baseline Target date 11/05/2016   Time 4   Period Weeks   Status On-going     PT LONG TERM GOAL #2   Title The patient will improve ABC score from 26.9% to > or equal to 38% to demo improved confidence with balance.   Baseline Target date 11/05/2016   Time 4   Period Weeks   Status On-going     PT LONG TERM GOAL #3   Title The patient wil reduce TUG score from 15.13 seconds to < or equal to 13.5 seconds to demo dec'd risk for falls.   Baseline Target date 11/05/2016   Time 4   Period Weeks   Status On-going     PT LONG TERM GOAL #4   Title The patient will improve gait speed from 2.27 ft/sec to > or equal to 2.62 ft/sec to demo transition to "full community ambulator" classification of gait.   Baseline Target date 11/05/2016   Time 4   Period Weeks   Status On-going     PT LONG TERM GOAL #5   Title The patient will improve Berg from 45/56 to > or equal to 49/56 to demo dec'd risk for falls.   Baseline Met on 11/01/2016   Time 4   Period Weeks   Status Achieved     PT LONG TERM GOAL #6   Title The patient will negotiate 4 steps with one handrail modified indep using reciprocal pattern.   Baseline Met on  10/20/2016   Time 4   Period Weeks   Status Achieved        Remaining deficits: See note 11/2 for last known status.   Education / Equipment: HEP, Engineer, materials, community wellness.   Plan: Patient agrees to discharge.  Patient goals were not met. Patient is being discharged due to not returning since the last visit.  ?????        Thank you for the referral of this patient. Rudell Cobb, MPT   Eithan Beagle 12/14/2016, 9:18 AM  [facility] [address] [place], [place], [address] Phone: [phone]   Fax:  [phone]

## 2016-12-17 DIAGNOSIS — F064 Anxiety disorder due to known physiological condition: Secondary | ICD-10-CM | POA: Diagnosis not present

## 2017-01-08 DIAGNOSIS — F064 Anxiety disorder due to known physiological condition: Secondary | ICD-10-CM | POA: Diagnosis not present

## 2017-01-28 DIAGNOSIS — F064 Anxiety disorder due to known physiological condition: Secondary | ICD-10-CM | POA: Diagnosis not present

## 2017-01-29 DIAGNOSIS — F411 Generalized anxiety disorder: Secondary | ICD-10-CM | POA: Diagnosis not present

## 2017-01-29 DIAGNOSIS — F3181 Bipolar II disorder: Secondary | ICD-10-CM | POA: Diagnosis not present

## 2017-04-24 DIAGNOSIS — H5202 Hypermetropia, left eye: Secondary | ICD-10-CM | POA: Diagnosis not present

## 2017-04-24 DIAGNOSIS — H1859 Other hereditary corneal dystrophies: Secondary | ICD-10-CM | POA: Diagnosis not present

## 2017-04-24 DIAGNOSIS — H5211 Myopia, right eye: Secondary | ICD-10-CM | POA: Diagnosis not present

## 2017-04-24 DIAGNOSIS — H52223 Regular astigmatism, bilateral: Secondary | ICD-10-CM | POA: Diagnosis not present

## 2017-04-25 DIAGNOSIS — F064 Anxiety disorder due to known physiological condition: Secondary | ICD-10-CM | POA: Diagnosis not present

## 2017-05-04 ENCOUNTER — Ambulatory Visit (HOSPITAL_COMMUNITY)
Admission: EM | Admit: 2017-05-04 | Discharge: 2017-05-04 | Disposition: A | Discharge status: Home / Self Care | Payer: Medicare Other | Attending: Internal Medicine | Admitting: Internal Medicine

## 2017-05-04 ENCOUNTER — Encounter (HOSPITAL_COMMUNITY): Payer: Self-pay | Admitting: Emergency Medicine

## 2017-05-04 DIAGNOSIS — X158XXA Contact with other hot household appliances, initial encounter: Secondary | ICD-10-CM | POA: Diagnosis not present

## 2017-05-04 DIAGNOSIS — T300 Burn of unspecified body region, unspecified degree: Secondary | ICD-10-CM

## 2017-05-04 DIAGNOSIS — T23112A Burn of first degree of left thumb (nail), initial encounter: Secondary | ICD-10-CM

## 2017-05-04 MED ORDER — IBUPROFEN 800 MG PO TABS
ORAL_TABLET | ORAL | Status: AC
  Filled 2017-05-04: qty 1

## 2017-05-04 MED ORDER — SILVER SULFADIAZINE 1 % EX CREA
TOPICAL_CREAM | CUTANEOUS | Status: AC
  Filled 2017-05-04: qty 85

## 2017-05-04 MED ORDER — IBUPROFEN 800 MG PO TABS: 800.0000 mg | ORAL_TABLET | Freq: Three times a day (TID) | ORAL | 0 refills | Status: DC

## 2017-05-04 MED ORDER — IBUPROFEN 800 MG PO TABS
800.0000 mg | ORAL_TABLET | Freq: Once | ORAL | Status: AC
  Administered 2017-05-04: 800 mg via ORAL

## 2017-05-04 MED ORDER — SILVER SULFADIAZINE 1 % EX CREA
TOPICAL_CREAM | Freq: Once | CUTANEOUS | Status: AC
  Administered 2017-05-04: 16:00:00 via TOPICAL

## 2017-05-04 NOTE — ED Triage Notes (Signed)
Pt here for 1st degree burn to left hand onset 1400  States she burned her hand w/an electric tea kettle..  A&O x4... NAD

## 2017-05-04 NOTE — ED Provider Notes (Signed)
CSN: [id number]     Arrival date & time 05/04/17  1425 History   First MD Initiated Contact with Patient 05/04/17 1541     Chief Complaint  Patient presents with  . Hand Burn   (Consider location/radiation/quality/duration/timing/severity/associated sxs/prior Treatment) 83 y.o. female presents with first degree burn to left thumb. Patient states that she burned it on an electric tea kettle.  Patient has full ROM of extremity and is requesting silverdine. X Condition is acute in nature. Patient denies any relief from ice applied prior to there arrival at this facility.  No blistering or sloughed skin noted Last tetanus 2015.        Past Medical History:  Diagnosis Date  . Anxiety   . Arthritis   . Asthma   . Bronchitis    hx of   . [facility] headaches    [facility]  . Cystitis    1954  . Depression   . Ear pain    bilat   . History of blood transfusion    2010  . Hx of blood clots 2007 & 2010   post TKR  . Hyperthyroidism   . Pelvic fracture (HCC)    hx of in three places secondary to fall   . Pneumonia    hx of 1952  . Pulmonary embolism ([place]) 1993   on HRT  . Right wrist fracture    hx of 1995 has plate secondary to fall   . Shaking    hands bilat  . Shortness of breath dyspnea   . Skin cancer    basal cell   . Spinal stenosis   . Trauma    left lower leg 10/22/2014   . Urinary frequency    Past Surgical History:  Procedure Laterality Date  . ABDOMINAL HYSTERECTOMY    . BREAST ENHANCEMENT SURGERY  1985  . CHOLECYSTECTOMY    . COLONOSCOPY     diverticulosis  . ESI   11/2013    C7-T1 ; Dr Nelva Bush  . EYE SURGERY     bilat cataract surgery   . INSERTION OF MESH N/A 07/08/2015   Procedure: INSERTION OF MESH;  Surgeon: Armandina Gemma, MD;  Location: [facility];  Service: General;  Laterality: N/A;  . REPLACEMENT TOTAL KNEE BILATERAL  2007 & 2010  . SP FACET INJECTION  01/19/14   RC2-3,3-4,C4-5  . TONSILLECTOMY    . VENTRAL HERNIA REPAIR N/A 07/08/2015   Procedure:  VENTRAL ADULT REPAIR VENTRAL INCISIONAL HERNIA REPAIR;  Surgeon: Armandina Gemma, MD;  Location: [facility];  Service: General;  Laterality: N/A;   Family History  Problem Relation Age of Onset  . Allergies Brother   . Asthma Brother   . Heart disease Brother   . Clotting disorder Father     PTE X 2  . Skin cancer Father   . Skin cancer Mother   . Sudden death      2 M aunts & 2 M uncles in 47s  . Heart attack Brother     > 55  . Diabetes Daughter   . Diabetes Son   . Mental illness Son     suicide  . Stroke Neg Hx    Social History  Substance Use Topics  . Smoking status: Never Smoker  . Smokeless tobacco: Never Used  . Alcohol use Yes     Comment: minimal   OB History    No data available     Review of Systems  Constitutional: Negative for chills  and fever.  HENT: Negative for ear pain and sore throat.   Eyes: Negative for pain and visual disturbance.  Respiratory: Negative for cough and shortness of breath.   Cardiovascular: Negative for chest pain and palpitations.  Gastrointestinal: Negative for abdominal pain and vomiting.  Genitourinary: Negative for dysuria and hematuria.  Musculoskeletal: Negative for arthralgias and back pain.  Skin: Negative for color change and rash.       Burn to left thumb  Neurological: Negative for seizures and syncope.  All other systems reviewed and are negative.   Allergies  Penicillins and Sulfa antibiotics  Home Medications   Prior to Admission medications   Medication Sig Start Date End Date Taking? Authorizing Provider  albuterol (PROVENTIL HFA;VENTOLIN HFA) 108 (90 BASE) MCG/ACT inhaler Inhale 2 puffs into the lungs every 6 (six) hours as needed for wheezing or shortness of breath. 03/16/15  Yes Hoyt Koch, MD  Aspirin-Acetaminophen-Caffeine (EXCEDRIN PO) Take 1 tablet by mouth every 6 (six) hours as needed (for migraines).    Yes [provider]  atorvastatin (LIPITOR) 20 MG tablet Take 1 tablet (20 mg total)  by mouth daily. 09/06/16  Yes Burns, Claudina Lick, MD  buPROPion (WELLBUTRIN) 75 MG tablet Take 75 mg by mouth 2 (two) times daily.   Yes [provider]  cholecalciferol (VITAMIN D) 1000 UNITS tablet Take 1,000 Units by mouth daily.   Yes [provider]  Cyanocobalamin 1000 MCG/15ML LIQD Take 2,000 mcg by mouth every morning.    Yes [provider]  DULoxetine (CYMBALTA) 20 MG capsule Take 40 mg by mouth daily.   Yes [provider]  ranitidine (ZANTAC) 150 MG tablet Take 150 mg by mouth at bedtime.   Yes [provider]  ranitidine (ZANTAC) 150 MG tablet TAKE 1 TABLET(150 MG) BY MOUTH AT BEDTIME 11/26/16  Yes Burns, Claudina Lick, MD  ibuprofen (ADVIL,MOTRIN) 800 MG tablet Take 1 tablet (800 mg total) by mouth 3 (three) times daily. 05/04/17   Jacqualine Mau, NP   Meds Ordered and Administered this Visit   Medications  silver sulfADIAZINE (SILVADENE) 1 % cream ( Topical Given 05/04/17 1603)  ibuprofen (ADVIL,MOTRIN) tablet 800 mg (800 mg Oral Given 05/04/17 1603)   Patient is allergic to Sulfa. Allergy discussed with patient. Patient requests silvadene. Patient education provided regarding allergic reactions.    BP 138/68 (BP Location: Left Arm)   Pulse 85   Temp 98.6 F (37 C) (Oral)   Resp 18   SpO2 97%  No data found.   Physical Exam  Constitutional: She appears well-developed and well-nourished. No distress.  HENT:  Head: Normocephalic and atraumatic.  Eyes: Conjunctivae are normal.  Neck: Neck supple.  Cardiovascular: Normal rate and regular rhythm.   No murmur heard. Pulmonary/Chest: Effort normal and breath sounds normal. No respiratory distress.  Abdominal: Soft. There is no tenderness.  Musculoskeletal: She exhibits no edema.  Neurological: She is alert.  Skin: Skin is warm and dry.  Burn noted approximately 5 cm in length to left thumb. No sloughing or blistering noted.   Psychiatric: She has a normal mood and affect.  Nursing  note and vitals reviewed.   Urgent Care Course     Procedures (including critical care time)  Labs Review Labs Reviewed - No data to display  Imaging Review No results found.      MDM   1. Burn       Jacqualine Mau, NP 05/04/17 1609

## 2017-05-04 NOTE — Discharge Instructions (Signed)
Apply topical antibiotic ointment and change dressing daily.

## 2017-05-07 ENCOUNTER — Other Ambulatory Visit (INDEPENDENT_AMBULATORY_CARE_PROVIDER_SITE_OTHER): Payer: Medicare Other

## 2017-05-07 ENCOUNTER — Encounter: Payer: Self-pay | Admitting: Internal Medicine

## 2017-05-07 ENCOUNTER — Ambulatory Visit (INDEPENDENT_AMBULATORY_CARE_PROVIDER_SITE_OTHER): Payer: Medicare Other | Admitting: Internal Medicine

## 2017-05-07 VITALS — BP 124/76 | HR 76 | Temp 98.1°F | Resp 16 | Ht 67.0 in | Wt 158.0 lb

## 2017-05-07 DIAGNOSIS — T23012A Burn of unspecified degree of left thumb (nail), initial encounter: Secondary | ICD-10-CM | POA: Diagnosis not present

## 2017-05-07 DIAGNOSIS — N183 Chronic kidney disease, stage 3 unspecified: Secondary | ICD-10-CM

## 2017-05-07 DIAGNOSIS — R739 Hyperglycemia, unspecified: Secondary | ICD-10-CM | POA: Diagnosis not present

## 2017-05-07 DIAGNOSIS — Z0000 Encounter for general adult medical examination without abnormal findings: Secondary | ICD-10-CM | POA: Diagnosis not present

## 2017-05-07 DIAGNOSIS — E7800 Pure hypercholesterolemia, unspecified: Secondary | ICD-10-CM

## 2017-05-07 DIAGNOSIS — K5909 Other constipation: Secondary | ICD-10-CM

## 2017-05-07 DIAGNOSIS — K219 Gastro-esophageal reflux disease without esophagitis: Secondary | ICD-10-CM

## 2017-05-07 DIAGNOSIS — T300 Burn of unspecified body region, unspecified degree: Secondary | ICD-10-CM

## 2017-05-07 LAB — COMPREHENSIVE METABOLIC PANEL
ALK PHOS: 107 U/L (ref 39–117)
ALT: 23 U/L (ref 0–35)
AST: 29 U/L (ref 0–37)
Albumin: 4.2 g/dL (ref 3.5–5.2)
BILIRUBIN TOTAL: 0.8 mg/dL (ref 0.2–1.2)
BUN: 23 mg/dL (ref 6–23)
CO2: 27 meq/L (ref 19–32)
CREATININE: 1.01 mg/dL (ref 0.40–1.20)
Calcium: 9.7 mg/dL (ref 8.4–10.5)
Chloride: 106 mEq/L (ref 96–112)
GFR: 55.6 mL/min — ABNORMAL LOW (ref >60.00)
GLUCOSE: 104 mg/dL — AB (ref 70–99)
Potassium: 4.6 mEq/L (ref 3.5–5.1)
Sodium: 139 mEq/L (ref 135–145)
TOTAL PROTEIN: 6.9 g/dL (ref 6.0–8.3)

## 2017-05-07 LAB — LIPID PANEL
CHOL/HDL RATIO: 3
Cholesterol: 174 mg/dL (ref 0–200)
HDL: 62 mg/dL (ref >39.00)
LDL CALC: 96 mg/dL (ref 0–99)
NONHDL: 111.92
Triglycerides: 78 mg/dL (ref 0.0–149.0)
VLDL: 15.6 mg/dL (ref 0.0–40.0)

## 2017-05-07 LAB — HEMOGLOBIN A1C: Hgb A1c MFr Bld: 5.8 % (ref 4.6–6.5)

## 2017-05-07 MED ORDER — SILVER SULFADIAZINE 1 % EX CREA: 1.0000 "application " | TOPICAL_CREAM | Freq: Two times a day (BID) | CUTANEOUS | 0 refills | Status: DC

## 2017-05-07 NOTE — Progress Notes (Signed)
Subjective:    Patient ID: Faith Edwards, female    DOB: Jul 08, 1932, 83 y.o.   MRN: [id number]  HPI The patient is here for follow up.  Burn:  Three days ago she burned her left thumb.  She did go to the ED and they applied silver sulfadiazine and she did not react.  They did not give her a prescription due to her sulfa allergy.  The burn hurts.   She would like a prescription.   Constipation:  She takes smooth move and that helps.   She would like to see Dr Fuller Plan. She is concerned she is reliant on smooth move  CKD: she is having darker urine.  She denies change in amount of urine.  She deneis dysuria or hematuria.    Cold feet;  Her feet are freezing.  She denies LE edema.  She is exercising regularly and walks up to 4 miles with her dog.    GERD;  She has occasional GERD and takes zantac only as needed.   Hyperlipidemia: She is taking her medication daily. She is compliant with a low fat/cholesterol diet. She is exercising regularly. She denies myalgias.   Poor sleep:  She following with Dr Casimiro Needle.  She was prescribed restoril and she does not like it.  She takes melatonin.    Medications and allergies reviewed with patient and updated if appropriate.  Patient Active Problem List   Diagnosis Date Noted  . CKD (chronic kidney disease) stage 3, GFR 30-59 ml/min 11/08/2016  . Hyperglycemia 08/24/2016  . Poor balance 08/24/2016  . Open wound of head 08/07/2016  . Bursitis of right knee 08/07/2016  . Mild concussion 08/07/2016  . Herpes zoster 06/02/2016  . Upper airway cough syndrome 06/01/2016  . Asthma with exacerbation 05/30/2016  . Cough 05/30/2016  . Hyperlipidemia 03/22/2016  . Choking 03/15/2016  . GERD (gastroesophageal reflux disease) 03/15/2016  . Graves disease 01/04/2016  . Osteopenia, severe 10/14/2015  . Other constipation 09/01/2015  . Incisional hernia 07/08/2015  . Ventral hernia without obstruction or gangrene 05/12/2015  . Lower back pain 05/12/2015    . Spinal stenosis in cervical region 11/26/2013  . Depression 08/05/2008  . Personal history of venous thrombosis and embolism 08/05/2008  . Chronic rhinitis 12/29/2007  . Asthma 12/29/2007    Current Outpatient Prescriptions on File Prior to Visit  Medication Sig Dispense Refill  . albuterol (PROVENTIL HFA;VENTOLIN HFA) 108 (90 BASE) MCG/ACT inhaler Inhale 2 puffs into the lungs every 6 (six) hours as needed for wheezing or shortness of breath. 1 Inhaler 0  . Aspirin-Acetaminophen-Caffeine (EXCEDRIN PO) Take 1 tablet by mouth every 6 (six) hours as needed (for migraines).     Marland Kitchen atorvastatin (LIPITOR) 20 MG tablet Take 1 tablet (20 mg total) by mouth daily. 90 tablet 3  . cholecalciferol (VITAMIN D) 1000 UNITS tablet Take 1,000 Units by mouth daily.    . Cyanocobalamin 1000 MCG/15ML LIQD Take 2,000 mcg by mouth every morning.     . DULoxetine (CYMBALTA) 20 MG capsule Take 30 mg by mouth daily.     Marland Kitchen ibuprofen (ADVIL,MOTRIN) 800 MG tablet Take 1 tablet (800 mg total) by mouth 3 (three) times daily. 21 tablet 0  . ranitidine (ZANTAC) 150 MG tablet Take 150 mg by mouth at bedtime.    . [DISCONTINUED] Fluticasone-Salmeterol (ADVAIR) 100-50 MCG/DOSE AEPB Inhale 1 puff into the lungs 2 (two) times daily.     No current facility-administered medications on file prior to visit.  Past Medical History:  Diagnosis Date  . Anxiety   . Arthritis   . Asthma   . Bronchitis    hx of   . Chronic headaches    [facility]  . Cystitis    1954  . Depression   . Ear pain    bilat   . History of blood transfusion    2010  . Hx of blood clots 2007 & 2010   post TKR  . Hyperthyroidism   . Pelvic fracture (HCC)    hx of in three places secondary to fall   . Pneumonia    hx of 1952  . Pulmonary embolism ([place]) 1993   on HRT  . Right wrist fracture    hx of 1995 has plate secondary to fall   . Shaking    hands bilat  . Shortness of breath dyspnea   . Skin cancer    basal cell   .  Spinal stenosis   . Trauma    left lower leg 10/22/2014   . Urinary frequency     Past Surgical History:  Procedure Laterality Date  . ABDOMINAL HYSTERECTOMY    . BREAST ENHANCEMENT SURGERY  1985  . CHOLECYSTECTOMY    . COLONOSCOPY     diverticulosis  . ESI   11/2013    C7-T1 ; Dr Nelva Bush  . EYE SURGERY     bilat cataract surgery   . INSERTION OF MESH N/A 07/08/2015   Procedure: INSERTION OF MESH;  Surgeon: Armandina Gemma, MD;  Location: [facility];  Service: General;  Laterality: N/A;  . REPLACEMENT TOTAL KNEE BILATERAL  2007 & 2010  . SP FACET INJECTION  01/19/14   RC2-3,3-4,C4-5  . TONSILLECTOMY    . VENTRAL HERNIA REPAIR N/A 07/08/2015   Procedure: VENTRAL ADULT REPAIR VENTRAL INCISIONAL HERNIA REPAIR;  Surgeon: Armandina Gemma, MD;  Location: [facility];  Service: General;  Laterality: N/A;    Social History   Social History  . Marital status: Married    Spouse name: N/A  . Number of children: 3  . Years of education: N/A   Occupational History  . retired    Social History Main Topics  . Smoking status: Never Smoker  . Smokeless tobacco: Never Used  . Alcohol use Yes     Comment: minimal  . Drug use: No  . Sexual activity: Not on file   Other Topics Concern  . Not on file   Social History Narrative  . No narrative on file    Family History  Problem Relation Age of Onset  . Allergies Brother   . Asthma Brother   . Heart disease Brother   . Clotting disorder Father     PTE X 2  . Skin cancer Father   . Skin cancer Mother   . Sudden death      2 M aunts & 2 M uncles in 17s  . Heart attack Brother     > 55  . Diabetes Daughter   . Diabetes Son   . Mental illness Son     suicide  . Stroke Neg Hx     Review of Systems  Constitutional: Negative for chills and fever.  Respiratory: Negative for cough, shortness of breath and wheezing.   Cardiovascular: Negative for chest pain, palpitations and leg swelling.  Genitourinary: Negative for decreased urine volume,  dysuria and hematuria.  Neurological: Negative for light-headedness and headaches.       Objective:   Vitals:  05/07/17 1509  BP: 124/76  Pulse: 76  Resp: 16  Temp: 98.1 F (36.7 C)   Wt Readings from Last 3 Encounters:  05/07/17 158 lb (71.7 kg)  08/30/16 170 lb (77.1 kg)  08/24/16 171 lb (77.6 kg)   Body mass index is 24.75 kg/m.   Physical Exam    Constitutional: Appears well-developed and well-nourished. No distress.  HENT:  Head: Normocephalic and atraumatic.  Neck: Neck supple. No tracheal deviation present. No thyromegaly present.  No cervical lymphadenopathy Cardiovascular: Normal rate, regular rhythm and normal heart sounds.   No murmur heard. No carotid bruit .  No edema Pulmonary/Chest: Effort normal and breath sounds normal. No respiratory distress. No has no wheezes. No rales.  Skin: Skin is warm and dry. Not diaphoretic. left thumb with erythema and tender to palpation, no skin break or wound Psychiatric: Normal mood and affect. Behavior is normal.      Assessment & Plan:    See Problem List for Assessment and Plan of chronic medical problems.   FU in 1 year

## 2017-05-07 NOTE — Assessment & Plan Note (Signed)
cmp

## 2017-05-07 NOTE — Assessment & Plan Note (Signed)
Left thumb No evidence of infection Skin intact She does have some pain Will prescribe silvadene - she will monitor closely for reactions - she still wants to try it

## 2017-05-07 NOTE — Patient Instructions (Addendum)
Continue to eat heart healthy diet (full of fruits, vegetables, whole grains, lean protein, water--limit salt, fat, and sugar intake) and increase physical activity as tolerated.  Continue doing brain stimulating activities (puzzles, reading, adult coloring books, staying active) to keep memory sharp.    Faith Edwards , Thank you for taking time to come for your Medicare Wellness Visit. I appreciate your ongoing commitment to your health goals. Please review the following plan we discussed and let me know if I can assist you in the future.   These are the goals we discussed: Goals    . Maintain current health status          Continue to exercise and eat healthy       This is a list of the screening recommended for you and due dates:  Health Maintenance  Topic Date Due  . Flu Shot  07/31/2017  . Tetanus Vaccine  10/10/2024  . DEXA scan (bone density measurement)  Completed  . Pneumonia vaccines  Completed     Test(s) ordered today. Your results will be released to [facility] (or called to you) after review, usually within 72hours after test completion. If any changes need to be made, you will be notified at that same time.   Medications reviewed and updated.  No changes recommended at this time.  Your prescription(s) have been submitted to your pharmacy. Please take as directed and contact our office if you believe you are having problem(s) with the medication(s).  A referral was ordered for GI   Please followup in 6 months

## 2017-05-07 NOTE — Progress Notes (Signed)
Subjective:   Faith Edwards is a 83 y.o. female who presents for Medicare Annual (Subsequent) preventive examination.  Review of Systems:  No ROS.  Medicare Wellness Visit.  Cardiac Risk Factors include: advanced age (>106men, >52 women);dyslipidemia;hypertension Sleep patterns: has frequent nighttime awakenings, gets up 1-2 times nightly to void and sleeps 5-6 hours nightly.  Patient reports long-term insomnia issues, discussed recommended sleep tips.   Home Safety/Smoke Alarms: Feels safe in home. Smoke alarms in place.   Living environment; residence and Firearm Safety: 2-story house, number of inside stairs: 15 circle step that goes to her bedroom, states this is becoming difficult to climb and is dicussing putting in an elevator., no firearms., no needs for DME at this time. Seat Belt Safety/Bike Helmet: Wears seat belt.   Counseling:   Eye Exam- appointment yearly Dental- appointment every 6 months   Female:   Pap-  N/A     Mammo- Last 01/21/11, BI-RADS category 1:  negative       Dexa scan- Last 10/12/15, osteopenia        CCS- N/A     Objective:     Vitals: BP 124/76   Pulse 76   Temp 98.1 F (36.7 C) (Oral)   Resp 16   Ht 5\' 7"  (1.702 m)   Wt 158 lb (71.7 kg)   SpO2 93%   BMI 24.75 kg/m   Body mass index is 24.75 kg/m.   Tobacco History  Smoking Status  . Never Smoker  Smokeless Tobacco  . Never Used     Counseling given: Not Answered   Past Medical History:  Diagnosis Date  . Anxiety   . Arthritis   . Asthma   . Bronchitis    hx of   . Chronic headaches    [facility]  . Cystitis    1954  . Depression   . Ear pain    bilat   . History of blood transfusion    2010  . Hx of blood clots 2007 & 2010   post TKR  . Hyperthyroidism   . Pelvic fracture (HCC)    hx of in three places secondary to fall   . Pneumonia    hx of 1952  . Pulmonary embolism ([place]) 1993   on HRT  . Right wrist fracture    hx of 1995 has plate secondary to  fall   . Shaking    hands bilat  . Shortness of breath dyspnea   . Skin cancer    basal cell   . Spinal stenosis   . Trauma    left lower leg 10/22/2014   . Urinary frequency    Past Surgical History:  Procedure Laterality Date  . ABDOMINAL HYSTERECTOMY    . BREAST ENHANCEMENT SURGERY  1985  . CHOLECYSTECTOMY    . COLONOSCOPY     diverticulosis  . ESI   11/2013    C7-T1 ; Dr Nelva Bush  . EYE SURGERY     bilat cataract surgery   . INSERTION OF MESH N/A 07/08/2015   Procedure: INSERTION OF MESH;  Surgeon: Armandina Gemma, MD;  Location: [facility];  Service: General;  Laterality: N/A;  . REPLACEMENT TOTAL KNEE BILATERAL  2007 & 2010  . SP FACET INJECTION  01/19/14   RC2-3,3-4,C4-5  . TONSILLECTOMY    . VENTRAL HERNIA REPAIR N/A 07/08/2015   Procedure: VENTRAL ADULT REPAIR VENTRAL INCISIONAL HERNIA REPAIR;  Surgeon: Armandina Gemma, MD;  Location: [facility];  Service: General;  Laterality: N/A;   Family History  Problem Relation Age of Onset  . Allergies Brother   . Asthma Brother   . Heart disease Brother   . Clotting disorder Father     PTE X 2  . Skin cancer Father   . Skin cancer Mother   . Sudden death      2 M aunts & 2 M uncles in 65s  . Heart attack Brother     > 55  . Bladder Cancer Brother   . Diabetes Daughter   . Breast cancer Daughter   . Diabetes Son   . Mental illness Son     suicide  . Stroke Neg Hx    History  Sexual Activity  . Sexual activity: Not on file    Outpatient Encounter Prescriptions as of 05/07/2017  Medication Sig  . albuterol (PROVENTIL HFA;VENTOLIN HFA) 108 (90 BASE) MCG/ACT inhaler Inhale 2 puffs into the lungs every 6 (six) hours as needed for wheezing or shortness of breath.  . Aspirin-Acetaminophen-Caffeine (EXCEDRIN PO) Take 1 tablet by mouth every 6 (six) hours as needed (for migraines).   Marland Kitchen atorvastatin (LIPITOR) 20 MG tablet Take 1 tablet (20 mg total) by mouth daily.  . cholecalciferol (VITAMIN D) 1000 UNITS tablet Take 1,000 Units by mouth  daily.  . Cyanocobalamin 1000 MCG/15ML LIQD Take 2,000 mcg by mouth every morning.   . DULoxetine (CYMBALTA) 20 MG capsule Take 30 mg by mouth daily.   Marland Kitchen ibuprofen (ADVIL,MOTRIN) 800 MG tablet Take 1 tablet (800 mg total) by mouth 3 (three) times daily.  . ranitidine (ZANTAC) 150 MG tablet Take 150 mg by mouth at bedtime.  . temazepam (RESTORIL) 15 MG capsule Take 15 mg by mouth at bedtime as needed for sleep.  . silver sulfADIAZINE (SILVADENE) 1 % cream Apply 1 application topically 2 (two) times daily.  . [DISCONTINUED] buPROPion (WELLBUTRIN) 75 MG tablet Take 75 mg by mouth 2 (two) times daily.  . [DISCONTINUED] ranitidine (ZANTAC) 150 MG tablet TAKE 1 TABLET(150 MG) BY MOUTH AT BEDTIME   No facility-administered encounter medications on file as of 05/07/2017.     Activities of Daily Living In your present state of health, do you have any difficulty performing the following activities: 05/07/2017  Hearing? N  Vision? N  Difficulty concentrating or making decisions? Y  Walking or climbing stairs? Y  Dressing or bathing? N  Doing errands, shopping? N  Preparing Food and eating ? N  Using the Toilet? N  In the past six months, have you accidently leaked urine? Y  Do you have problems with loss of bowel control? N  Managing your Medications? N  Managing your Finances? N  Housekeeping or managing your Housekeeping? N  Some recent data might be hidden    Patient Care Team: Binnie Rail, MD as PCP - General (Internal Medicine)    Assessment:    Physical assessment deferred to PCP.  Exercise Activities and Dietary recommendations Current Exercise Habits: Home exercise routine, Type of exercise: walking, Time (Minutes): 50, Frequency (Times/Week): 5, Weekly Exercise (Minutes/Week): 250, Intensity: Mild, Exercise limited by: None identified Diet (meal preparation, eat out, water intake, caffeinated beverages, dairy products, fruits and vegetables): in general, a "healthy" diet  , well  balanced, high fiber, low fat/ cholesterol, low salt eats a variety of fruits and vegetables daily, limits salt, fat/cholesterol, sugar, caffeine, drinks 6-8 glasses of water daily.     Goals    . Maintain current health status  Continue to exercise and eat healthy      Fall Risk Fall Risk  05/07/2017 05/07/2017 03/15/2016 01/07/2014  Falls in the past year? Yes No Yes No  Number falls in past yr: 1 - 1 -  Injury with Fall? Yes - Yes -  Risk Factor Category  High Fall Risk - - -  Risk for fall due to : Impaired balance/gait - - -  Risk for fall due to (comments): recently finish PT to help improve ambulatory gait - - -  Follow up Education provided;Falls prevention discussed - - -   Depression Screen PHQ 2/9 Scores 05/07/2017 05/07/2017 03/15/2016 01/07/2014  PHQ - 2 Score 1 0 1 6  PHQ- 9 Score 4 0 - 15     Cognitive Function MMSE - Mini Mental State Exam 05/07/2017  Orientation to time 3  Orientation to Place 5  Registration 3  Attention/ Calculation 5  Recall 3  Language- name 2 objects 2  Language- repeat 1  Language- follow 3 step command 3  Language- read & follow direction 1  Write a sentence 1  Copy design 1  Total score 28        Immunization History  Administered Date(s) Administered  . Influenza Whole 10/28/2008, 09/28/2009, 10/08/2011  . Influenza, Seasonal, Injecte, Preservative Fre 11/03/2013  . Influenza,inj,Quad PF,36+ Mos 10/05/2014, 09/30/2015  . Influenza-Unspecified 09/25/2016  . Pneumococcal Conjugate-13 09/30/2015  . Pneumococcal Polysaccharide-23 01/07/2014  . Tdap 10/10/2014   Screening Tests Health Maintenance  Topic Date Due  . INFLUENZA VACCINE  07/31/2017  . TETANUS/TDAP  10/10/2024  . DEXA SCAN  Completed  . PNA vac Low Risk Adult  Completed      Plan:     Continue to eat heart healthy diet (full of fruits, vegetables, whole grains, lean protein, water--limit salt, fat, and sugar intake) and increase physical activity as  tolerated.  Continue doing brain stimulating activities (puzzles, reading, adult coloring books, staying active) to keep memory sharp.   I have personally reviewed and noted the following in the patient's chart:   . Medical and social history . Use of alcohol, tobacco or illicit drugs  . Current medications and supplements . Functional ability and status . Nutritional status . Physical activity . Advanced directives . List of other physicians . Hospitalizations, surgeries, and ER visits in previous 12 months . Vitals . Screenings to include cognitive, depression, and falls . Referrals and appointments  In addition, I have reviewed and discussed with patient certain preventive protocols, quality metrics, and best practice recommendations. A written personalized care plan for preventive services as well as general preventive health recommendations were provided to patient.     Michiel Cowboy, RN  05/07/2017   Medical screening examination/treatment/procedure(s) were performed by non-physician practitioner and as supervising physician I was immediately available for consultation/collaboration. I agree with above. Binnie Rail, MD

## 2017-05-07 NOTE — Assessment & Plan Note (Signed)
Check a1c 

## 2017-05-07 NOTE — Assessment & Plan Note (Addendum)
Taking smooth move - controlled, but concerned about needing smooth move Has occ Left sided abd pain with certain foods Would like to  See Dr Fuller Plan- wants to consider a colonoscopy

## 2017-05-07 NOTE — Progress Notes (Signed)
Pre visit review using our clinic review tool, if applicable. No additional management support is needed unless otherwise documented below in the visit note. 

## 2017-05-07 NOTE — Assessment & Plan Note (Signed)
Takes zantac as needed Has only occasional GERD Continue zantac prn

## 2017-05-07 NOTE — Assessment & Plan Note (Signed)
Check lipid panel  Continue daily statin Regular exercise and healthy diet encouraged  

## 2017-05-09 ENCOUNTER — Encounter: Payer: Self-pay | Admitting: Internal Medicine

## 2017-05-09 DIAGNOSIS — R7303 Prediabetes: Secondary | ICD-10-CM | POA: Insufficient documentation

## 2017-05-21 DIAGNOSIS — H52223 Regular astigmatism, bilateral: Secondary | ICD-10-CM | POA: Diagnosis not present

## 2017-05-21 DIAGNOSIS — H5211 Myopia, right eye: Secondary | ICD-10-CM | POA: Diagnosis not present

## 2017-05-21 DIAGNOSIS — H524 Presbyopia: Secondary | ICD-10-CM | POA: Diagnosis not present

## 2017-05-21 DIAGNOSIS — Z961 Presence of intraocular lens: Secondary | ICD-10-CM | POA: Diagnosis not present

## 2017-05-21 DIAGNOSIS — H1859 Other hereditary corneal dystrophies: Secondary | ICD-10-CM | POA: Diagnosis not present

## 2017-05-21 DIAGNOSIS — H5202 Hypermetropia, left eye: Secondary | ICD-10-CM | POA: Diagnosis not present

## 2017-05-27 ENCOUNTER — Other Ambulatory Visit: Payer: Self-pay | Admitting: Internal Medicine

## 2017-06-12 ENCOUNTER — Encounter: Payer: Self-pay | Admitting: Gastroenterology

## 2017-06-12 ENCOUNTER — Ambulatory Visit (INDEPENDENT_AMBULATORY_CARE_PROVIDER_SITE_OTHER): Payer: Medicare Other | Admitting: Gastroenterology

## 2017-06-12 VITALS — BP 116/58 | HR 60 | Ht 67.0 in | Wt 157.0 lb

## 2017-06-12 DIAGNOSIS — K5900 Constipation, unspecified: Secondary | ICD-10-CM

## 2017-06-12 DIAGNOSIS — R1012 Left upper quadrant pain: Secondary | ICD-10-CM | POA: Diagnosis not present

## 2017-06-12 NOTE — Progress Notes (Signed)
History of Present Illness: This is an 83 year old female with small, pellet like stools and chronic constipation. Notes LUQ pain when walking or bending for about 1 month. She takes smooth move tea and milk of magnesia intermittently for management of constipation. She has also tried probiotics but they have not been helpful. She has adjusted her diet more high-fiber foods recently with improvement in her stool form and color. Colonoscopy in September 2016 showed mild sigmoid colon diverticulosis and internal hemorrhoids, otherwise normal. CMP in May was unremarkable except for a slightly decreased GFR and slightly increased glucose. Denies weight loss, diarrhea, change in stool caliber, melena, hematochezia, nausea, vomiting, dysphagia, reflux symptoms, chest pain.   Allergies  Allergen Reactions  . Penicillins Hives  . Sulfa Antibiotics Hives and Nausea And Vomiting    7-15   Outpatient Medications Prior to Visit  Medication Sig Dispense Refill  . albuterol (PROVENTIL HFA;VENTOLIN HFA) 108 (90 BASE) MCG/ACT inhaler Inhale 2 puffs into the lungs every 6 (six) hours as needed for wheezing or shortness of breath. 1 Inhaler 0  . Aspirin-Acetaminophen-Caffeine (EXCEDRIN PO) Take 1 tablet by mouth every 6 (six) hours as needed (for migraines).     Marland Kitchen atorvastatin (LIPITOR) 20 MG tablet Take 1 tablet (20 mg total) by mouth daily. 90 tablet 3  . cholecalciferol (VITAMIN D) 1000 UNITS tablet Take 1,000 Units by mouth daily.    . Cyanocobalamin 1000 MCG/15ML LIQD Take 2,000 mcg by mouth every morning.     . DULoxetine (CYMBALTA) 20 MG capsule Take 30 mg by mouth daily.     Marland Kitchen ibuprofen (ADVIL,MOTRIN) 800 MG tablet Take 1 tablet (800 mg total) by mouth 3 (three) times daily. 21 tablet 0  . silver sulfADIAZINE (SILVADENE) 1 % cream Apply 1 application topically 2 (two) times daily. 25 g 0  . temazepam (RESTORIL) 15 MG capsule Take 15 mg by mouth at bedtime as needed for sleep.    . ranitidine (ZANTAC)  150 MG tablet Take 150 mg by mouth at bedtime.    . ranitidine (ZANTAC) 150 MG tablet TAKE 1 TABLET(150 MG) BY MOUTH AT BEDTIME 90 tablet 3   No facility-administered medications prior to visit.    Past Medical History:  Diagnosis Date  . Anxiety   . Arthritis   . Asthma   . Bronchitis    hx of   . Chronic headaches    [facility]  . Cystitis    1954  . Depression   . Ear pain    bilat   . Hiatal hernia   . History of blood transfusion    2010  . Hx of blood clots 2007 & 2010   post TKR  . Hyperthyroidism   . Pelvic fracture (HCC)    hx of in three places secondary to fall   . Pneumonia    hx of 1952  . Pulmonary embolism ([place]) 1993   on HRT  . Right wrist fracture    hx of 1995 has plate secondary to fall   . Shaking    hands bilat  . Shortness of breath dyspnea   . Skin cancer    basal cell   . Spinal stenosis   . Trauma    left lower leg 10/22/2014   . Urinary frequency    Past Surgical History:  Procedure Laterality Date  . ABDOMINAL HYSTERECTOMY    . BREAST ENHANCEMENT SURGERY  1985  . CHOLECYSTECTOMY    . COLONOSCOPY  diverticulosis  . ESI   11/2013    C7-T1 ; Dr Nelva Bush  . EYE SURGERY     bilat cataract surgery   . INSERTION OF MESH N/A 07/08/2015   Procedure: INSERTION OF MESH;  Surgeon: Armandina Gemma, MD;  Location: [facility];  Service: General;  Laterality: N/A;  . REPLACEMENT TOTAL KNEE BILATERAL  2007 & 2010  . SP FACET INJECTION  01/19/14   RC2-3,3-4,C4-5  . TONSILLECTOMY    . VENTRAL HERNIA REPAIR N/A 07/08/2015   Procedure: VENTRAL ADULT REPAIR VENTRAL INCISIONAL HERNIA REPAIR;  Surgeon: Armandina Gemma, MD;  Location: [facility];  Service: General;  Laterality: N/A;   Social History   Social History  . Marital status: Married    Spouse name: N/A  . Number of children: 3  . Years of education: N/A   Occupational History  . retired    Social History Main Topics  . Smoking status: Never Smoker  . Smokeless tobacco: Never Used  . Alcohol use  Yes     Comment: minimal  . Drug use: No  . Sexual activity: Not Asked   Other Topics Concern  . None   Social History Narrative  . None   Family History  Problem Relation Age of Onset  . Allergies Brother   . Asthma Brother   . Heart disease Brother   . Clotting disorder Father        PTE X 2  . Skin cancer Father   . Skin cancer Mother   . Sudden death Unknown        2 M aunts & 2 M uncles in 46s  . Heart attack Brother        > 55  . Bladder Cancer Brother   . Diabetes Daughter   . Breast cancer Daughter   . Diabetes Son   . Mental illness Son        suicide  . Stroke Neg Hx       Physical Exam: General: Well developed, well nourished, elderly, no acute distress Head: Normocephalic and atraumatic Eyes:  sclerae anicteric, EOMI Ears: Normal auditory acuity Mouth: No deformity or lesions Lungs: Clear throughout to auscultation Heart: Regular rate and rhythm; no murmurs, rubs or bruits Abdomen: Soft, non tender and non distended. No masses, hepatosplenomegaly or hernias noted. Normal Bowel sounds Rectal: no lesions, slightly decreased sphincter tone, heme neg brown stool Musculoskeletal: Symmetrical with no gross deformities  Pulses:  Normal pulses noted Extremities: No clubbing, cyanosis, edema or deformities noted Neurological: Alert oriented x 4, grossly nonfocal Psychological:  Alert and cooperative. Normal mood and affect  Assessment and Recommendations:  1. Chronic constipation. Increase dietary fiber and daily water intake. Increase frequency of using smooth move tea and/or milk of magnesia to achieve complete, well formed bowel movements daily or every other day.  2. LUQ pain. This does not appear to be related to meals or bowel movements. R/O intraabdominal process vs musculoskeletal pain. Schedule abdominal/pelvic CT.

## 2017-06-12 NOTE — Patient Instructions (Signed)
You can take smooth move laxative tea or milk of magnesia for constipation.  You have been scheduled for a CT scan of the abdomen and pelvis at [facility] ([address]---this is in the same building as [facility]).   You are scheduled on 06/17/17 at 1:30pm. You should arrive 15 minutes prior to your appointment time for registration. Please follow the written instructions below on the day of your exam:  WARNING: IF YOU ARE ALLERGIC TO IODINE/X-RAY DYE, PLEASE NOTIFY RADIOLOGY IMMEDIATELY AT [phone]! YOU WILL BE GIVEN A 13 HOUR PREMEDICATION PREP.  1) Do not eat or drink anything after 9:30am (4 hours prior to your test) 2) You have been given 2 bottles of oral contrast to drink. The solution may taste               better if refrigerated, but do NOT add ice or any other liquid to this solution. Shake             well before drinking.    Drink 1 bottle of contrast @ 11:30am (2 hours prior to your exam)  Drink 1 bottle of contrast @ 12:30pm (1 hour prior to your exam)  You may take any medications as prescribed with a small amount of water except for the following: Metformin, Glucophage, Glucovance, Avandamet, Riomet, Fortamet, Actoplus Met, Janumet, Glumetza or Metaglip. The above medications must be held the day of the exam AND 48 hours after the exam.  The purpose of you drinking the oral contrast is to aid in the visualization of your intestinal tract. The contrast solution may cause some diarrhea. Before your exam is started, you will be given a small amount of fluid to drink. Depending on your individual set of symptoms, you may also receive an intravenous injection of x-ray contrast/dye. Plan on being at [facility] for 30 minutes or longer, depending on the type of exam you are having performed.  This test typically takes 30-45 minutes to complete.  If you have any questions regarding your exam or if you need to reschedule, you may call the CT department  at [phone] between the hours of 8:00 am and 5:00 pm, Monday-Friday.  ________________________________________________________________________  Thank you for choosing me and [facility].  Pricilla Riffle. Dagoberto Ligas., MD., Marval Regal

## 2017-06-17 ENCOUNTER — Ambulatory Visit (INDEPENDENT_AMBULATORY_CARE_PROVIDER_SITE_OTHER)
Admission: RE | Admit: 2017-06-17 | Discharge: 2017-06-17 | Disposition: A | Discharge status: Home / Self Care | Payer: Medicare Other | Source: Ambulatory Visit | Attending: Gastroenterology | Admitting: Gastroenterology

## 2017-06-17 DIAGNOSIS — R1032 Left lower quadrant pain: Secondary | ICD-10-CM | POA: Diagnosis not present

## 2017-06-17 DIAGNOSIS — R1012 Left upper quadrant pain: Secondary | ICD-10-CM | POA: Diagnosis not present

## 2017-06-17 DIAGNOSIS — K5900 Constipation, unspecified: Secondary | ICD-10-CM

## 2017-06-17 MED ORDER — IOPAMIDOL (ISOVUE-300) INJECTION 61%
100.0000 mL | Freq: Once | INTRAVENOUS | Status: AC | PRN
  Administered 2017-06-17: 100 mL via INTRAVENOUS

## 2017-06-25 DIAGNOSIS — H1859 Other hereditary corneal dystrophies: Secondary | ICD-10-CM | POA: Diagnosis not present

## 2017-06-25 DIAGNOSIS — H524 Presbyopia: Secondary | ICD-10-CM | POA: Diagnosis not present

## 2017-06-25 DIAGNOSIS — H5212 Myopia, left eye: Secondary | ICD-10-CM | POA: Diagnosis not present

## 2017-06-25 DIAGNOSIS — H5201 Hypermetropia, right eye: Secondary | ICD-10-CM | POA: Diagnosis not present

## 2017-06-25 DIAGNOSIS — H52223 Regular astigmatism, bilateral: Secondary | ICD-10-CM | POA: Diagnosis not present

## 2017-08-22 DIAGNOSIS — F3181 Bipolar II disorder: Secondary | ICD-10-CM | POA: Diagnosis not present

## 2017-08-30 ENCOUNTER — Ambulatory Visit (INDEPENDENT_AMBULATORY_CARE_PROVIDER_SITE_OTHER): Payer: Medicare Other | Admitting: Internal Medicine

## 2017-08-30 ENCOUNTER — Encounter: Payer: Self-pay | Admitting: Internal Medicine

## 2017-08-30 VITALS — BP 110/64 | HR 95 | Resp 16 | Wt 154.6 lb

## 2017-08-30 DIAGNOSIS — E0500 Thyrotoxicosis with diffuse goiter without thyrotoxic crisis or storm: Secondary | ICD-10-CM | POA: Diagnosis not present

## 2017-08-30 LAB — TSH: TSH: 2.07 u[IU]/mL (ref 0.35–4.50)

## 2017-08-30 LAB — T3, FREE: T3, Free: 3 pg/mL (ref 2.3–4.2)

## 2017-08-30 LAB — T4, FREE: Free T4: 0.81 ng/dL (ref 0.60–1.60)

## 2017-08-30 NOTE — Progress Notes (Signed)
Patient ID: LUNETTE TAPP, female   DOB: Mar 13, 1932, 83 y.o.   MRN: [id number]   HPI  Faith Edwards is a 83 y.o.-year-old female, returning for f/u for Graves ds. Last visit 1 year ago.  Since last visit, she lost 16 lbs since last visit by avoiding milk and sweets. She also avoids most meats.  I reviewed pt's thyroid tests: Lab Results  Component Value Date   TSH 2.01 08/30/2016   TSH 3.08 04/06/2016   TSH 2.20 01/04/2016   TSH 3.09 11/16/2015   TSH 0.04 (L) 09/29/2015   TSH 0.02 (L) 09/19/2015   TSH 0.04 (L) 09/12/2015   TSH 4.09 01/07/2014   TSH 3.54 05/07/2012   TSH 3.89 03/16/2011   FREET4 0.79 08/30/2016   FREET4 0.77 04/06/2016   FREET4 0.79 01/04/2016   FREET4 0.62 11/16/2015   FREET4 1.02 09/29/2015   FREET4 0.9 08/05/2008    Lab Results  Component Value Date   TSI 524 (H) 04/06/2016   TSI 531 (H) 09/29/2015   In the light of the high TSI, we assume that she had Graves' disease (bypassed the thyroid uptake and scan) and started methimazole. In 11/2015, We decreased MMI to 5 mg qod >> she then stopped (by herself) 02/2016. Subsequent TFTs have been normal >> We kept her off methimazole at last visit.  She has been on atenolol, which we stopped 2 years ago.  Pt denies: - feeling nodules in neck - hoarseness - dysphagia - choking - SOB with lying down  She c/o: - + fatigue - + insomnia -- on Ativan by Dr Amalia Greenhouse - + heat intolerance (had night sweats  - resolved) over the summer - + hair loss  Pt does have a FH of thyroid ds.: + mother. No FH of thyroid cancer. No h/o radiation tx to head or neck.  No seaweed or kelp. No recent contrast studies. No herbal supplements. No Biotin use. No recent steroids use.   She will take a trip to [place] next month.  ROS: Constitutional: + weight loss - intentional, + fatigue,  + subjective hyperthermia, no subjective hypothermia, + nocturia Eyes: no blurry vision, no xerophthalmia ENT: no sore throat, no nodules  palpated in throat, no dysphagia, no odynophagia, no hoarseness Cardiovascular: no CP/no SOB/no palpitations/no leg swelling Respiratory: + cough/no SOB/+ wheezing Gastrointestinal: no N/no V/no D/no C/no acid reflux Musculoskeletal: no muscle aches/+ joint aches Skin: no rashes, + hair loss Neurological: no tremors/no numbness/no tingling/no dizziness, + HA  I reviewed pt's medications, allergies, PMH, social hx, family hx, and changes were documented in the history of present illness. Otherwise, unchanged from my initial visit note.  Past Medical History:  Diagnosis Date  . Anxiety   . Arthritis   . Asthma   . Bronchitis    hx of   . Chronic headaches    [facility]  . Cystitis    1954  . Depression   . Ear pain    bilat   . Hiatal hernia   . History of blood transfusion    2010  . Hx of blood clots 2007 & 2010   post TKR  . Hyperthyroidism   . Pelvic fracture (HCC)    hx of in three places secondary to fall   . Pneumonia    hx of 1952  . Pulmonary embolism ([place]) 1993   on HRT  . Right wrist fracture    hx of 1995 has plate secondary to fall   .  Shaking    hands bilat  . Shortness of breath dyspnea   . Skin cancer    basal cell   . Spinal stenosis   . Trauma    left lower leg 10/22/2014   . Urinary frequency    Past Surgical History:  Procedure Laterality Date  . ABDOMINAL HYSTERECTOMY    . BREAST ENHANCEMENT SURGERY  1985  . CHOLECYSTECTOMY    . COLONOSCOPY     diverticulosis  . ESI   11/2013    C7-T1 ; Dr Nelva Bush  . EYE SURGERY     bilat cataract surgery   . INSERTION OF MESH N/A 07/08/2015   Procedure: INSERTION OF MESH;  Surgeon: Armandina Gemma, MD;  Location: [facility];  Service: General;  Laterality: N/A;  . REPLACEMENT TOTAL KNEE BILATERAL  2007 & 2010  . SP FACET INJECTION  01/19/14   RC2-3,3-4,C4-5  . TONSILLECTOMY    . VENTRAL HERNIA REPAIR N/A 07/08/2015   Procedure: VENTRAL ADULT REPAIR VENTRAL INCISIONAL HERNIA REPAIR;  Surgeon: Armandina Gemma, MD;   Location: [facility];  Service: General;  Laterality: N/A;   Social History   Social History  . Marital status: Married    Spouse name: N/A  . Number of children: 3  . Years of education: N/A   Occupational History  . retired    Social History Main Topics  . Smoking status: Never Smoker  . Smokeless tobacco: Never Used  . Alcohol use Yes     Comment: minimal  . Drug use: No  . Sexual activity: Not on file   Other Topics Concern  . Not on file   Social History Narrative  . No narrative on file   Current Outpatient Prescriptions on File Prior to Visit  Medication Sig Dispense Refill  . albuterol (PROVENTIL HFA;VENTOLIN HFA) 108 (90 BASE) MCG/ACT inhaler Inhale 2 puffs into the lungs every 6 (six) hours as needed for wheezing or shortness of breath. 1 Inhaler 0  . Aspirin-Acetaminophen-Caffeine (EXCEDRIN PO) Take 1 tablet by mouth every 6 (six) hours as needed (for migraines).     Marland Kitchen atorvastatin (LIPITOR) 20 MG tablet Take 1 tablet (20 mg total) by mouth daily. 90 tablet 3  . cholecalciferol (VITAMIN D) 1000 UNITS tablet Take 1,000 Units by mouth daily.    . Cyanocobalamin 1000 MCG/15ML LIQD Take 2,000 mcg by mouth every morning.     . DULoxetine (CYMBALTA) 20 MG capsule Take 30 mg by mouth daily.     . [DISCONTINUED] Fluticasone-Salmeterol (ADVAIR) 100-50 MCG/DOSE AEPB Inhale 1 puff into the lungs 2 (two) times daily.     No current facility-administered medications on file prior to visit.    Allergies  Allergen Reactions  . Penicillins Hives  . Sulfa Antibiotics Hives and Nausea And Vomiting    7-15   Family History  Problem Relation Age of Onset  . Allergies Brother   . Asthma Brother   . Heart disease Brother   . Clotting disorder Father        PTE X 2  . Skin cancer Father   . Skin cancer Mother   . Sudden death Unknown        2 M aunts & 2 M uncles in 71s  . Heart attack Brother        > 55  . Bladder Cancer Brother   . Diabetes Daughter   . Breast cancer  Daughter   . Diabetes Son   . Mental illness Son  suicide  . Stroke Neg Hx    PE: BP 110/64   Pulse 95   Resp 16   Wt 154 lb 9.6 oz (70.1 kg)   SpO2 97%   BMI 24.21 kg/m  Body mass index is 24.21 kg/m. Wt Readings from Last 3 Encounters:  08/30/17 154 lb 9.6 oz (70.1 kg)  06/12/17 157 lb (71.2 kg)  05/07/17 158 lb (71.7 kg)   Constitutional: normal weight, in NAD Eyes: PERRLA, EOMI, no exophthalmos ENT: moist mucous membranes, no thyromegaly, no cervical lymphadenopathy Cardiovascular: tachycardia, RR, No MRG Respiratory: CTA B Gastrointestinal: abdomen soft, NT, ND, BS+ Musculoskeletal: no deformities, strength intact in all 4 Skin: moist, warm, no rashes Neurological: no tremor with outstretched hands, DTR normal in all 4  ASSESSMENT: 1. Graves ds.  PLAN:  1. Patient with h/o thyrotoxicosis, with high TSI's >> most likely Graves ds. She has been on MMI >> we were able to stop in 02/2016 >> TFTs remained normal since then. - at this visit, she had some weight loss which she mentions is intentional. She also has tachycardia today (just found out she will go to [place] in 1 mo) - we discussed that she needs to check pulse at home >> if >90 >> to let me know as we may need to restart Atenolol - will check  Today: Orders Placed This Encounter  Procedures  . TSH  . T4, free  . T3, free  - RTC in 1 year  Office Visit on 08/30/2017  Component Date Value Ref Range Status  . TSH 08/30/2017 2.07  0.35 - 4.50 uIU/mL Final  . Free T4 08/30/2017 0.81  0.60 - 1.60 ng/dL Final   Comment: Specimens from patients who are undergoing biotin therapy and /or ingesting biotin supplements may contain high levels of biotin.  The higher biotin concentration in these specimens interferes with this Free T4 assay.  Specimens that contain high levels  of biotin may cause false high results for this Free T4 assay.  Please interpret results in light of the total clinical presentation of the  patient.    . T3, Free 08/30/2017 3.0  2.3 - 4.2 pg/mL Final   Normal labs.  Philemon Kingdom, MD PhD [facility]

## 2017-08-30 NOTE — Patient Instructions (Addendum)
Please continue off Methimazole.  Please stop at the lab.  Please let me know if pulse is >90 at rest.  Please come back for a follow-up appointment in 1 year.  

## 2017-09-03 ENCOUNTER — Telehealth: Payer: Self-pay | Admitting: Internal Medicine

## 2017-09-03 NOTE — Telephone Encounter (Signed)
Routing to you °

## 2017-09-03 NOTE — Telephone Encounter (Signed)
Patient was advised that if her heart rate increased to call the office, her heart rate is up to 100. Call patient to further advise.

## 2017-09-03 NOTE — Telephone Encounter (Signed)
Please advise 

## 2017-09-03 NOTE — Telephone Encounter (Signed)
If this is at rest and happens consistently, add back Atenolol 25 mg daily >> can you please refill.

## 2017-09-04 NOTE — Telephone Encounter (Signed)
Patient would like a call back to discuss Atenolol script that will be called in.   Just need instructions on what to do if her heart rate goes back to normal.  Ty,  -LL

## 2017-09-05 NOTE — Telephone Encounter (Signed)
Patient notified of message and voiced understanding.  

## 2017-09-05 NOTE — Telephone Encounter (Signed)
This is an every day medicine to avoid the peaks in heart rate. I would suggest to continue it for now as she may go above 90 at rest.

## 2017-09-05 NOTE — Telephone Encounter (Signed)
See message and please advise, Thanks!  

## 2017-09-05 NOTE — Progress Notes (Signed)
I spoke with patient and she was advised of results and voiced understanding.

## 2017-09-06 NOTE — Telephone Encounter (Signed)
Routing to you °

## 2017-09-06 NOTE — Telephone Encounter (Signed)
Patient is requesting a call from nurse to discuss the medication that she was to be placed on. Possibly the atenolol (patient was not sure). Call patient to advise.

## 2017-09-09 ENCOUNTER — Telehealth: Payer: Self-pay

## 2017-09-09 DIAGNOSIS — Z23 Encounter for immunization: Secondary | ICD-10-CM | POA: Diagnosis not present

## 2017-09-09 NOTE — Telephone Encounter (Signed)
Called patient and advised that there were not any new medications on her list as of right now. She understood, I advised she may have to go on the atenolol if her heart rate was above 90 at rest, patient stated an understanding per the last office visit note.

## 2017-09-10 DIAGNOSIS — N393 Stress incontinence (female) (male): Secondary | ICD-10-CM | POA: Diagnosis not present

## 2017-09-10 DIAGNOSIS — N3941 Urge incontinence: Secondary | ICD-10-CM | POA: Diagnosis not present

## 2017-09-17 ENCOUNTER — Other Ambulatory Visit: Payer: Self-pay | Admitting: Internal Medicine

## 2017-09-20 DIAGNOSIS — Z471 Aftercare following joint replacement surgery: Secondary | ICD-10-CM | POA: Diagnosis not present

## 2017-09-20 DIAGNOSIS — M25461 Effusion, right knee: Secondary | ICD-10-CM | POA: Diagnosis not present

## 2017-09-20 DIAGNOSIS — Z96652 Presence of left artificial knee joint: Secondary | ICD-10-CM | POA: Diagnosis not present

## 2017-09-20 DIAGNOSIS — M170 Bilateral primary osteoarthritis of knee: Secondary | ICD-10-CM | POA: Diagnosis not present

## 2017-10-09 DIAGNOSIS — R300 Dysuria: Secondary | ICD-10-CM | POA: Diagnosis not present

## 2017-10-09 DIAGNOSIS — R8271 Bacteriuria: Secondary | ICD-10-CM | POA: Diagnosis not present

## 2017-10-09 DIAGNOSIS — N952 Postmenopausal atrophic vaginitis: Secondary | ICD-10-CM | POA: Diagnosis not present

## 2017-11-01 ENCOUNTER — Ambulatory Visit: Payer: Medicare Other | Admitting: Internal Medicine

## 2017-11-07 NOTE — Progress Notes (Signed)
Subjective:    Patient ID: Faith Edwards, female    DOB: Feb 03, 1932, 83 y.o.   MRN: [id number]  HPI The patient is here for follow up.  She is seeing urology.  She did have a lump near the opening of   She wore hose on her flight back to [place] and she was there for two weeks.  She has poor circulation in her hands and feet.  She wonders if she should wear them regularly.   She had the old shingles vaccine.  She wonders about hte new vaccine.    Prediabetes:  She is compliant with a low sugar/carbohydrate diet.  She is exercising regularly - walking regularly.  Hyperlipidemia: She is taking her medication daily. She is compliant with a low fat/cholesterol diet. She is exercising regularly. She denies myalgias.     Medications and allergies reviewed with patient and updated if appropriate.  Patient Active Problem List   Diagnosis Date Noted  . Prediabetes 05/09/2017  . Burn 05/07/2017  . CKD (chronic kidney disease) stage 3, GFR 30-59 ml/min (HCC) 11/08/2016  . Hyperglycemia 08/24/2016  . Bursitis of right knee 08/07/2016  . Herpes zoster 06/02/2016  . Upper airway cough syndrome 06/01/2016  . Cough 05/30/2016  . Hyperlipidemia 03/22/2016  . Choking 03/15/2016  . GERD (gastroesophageal reflux disease) 03/15/2016  . Graves disease 01/04/2016  . Osteopenia, severe 10/14/2015  . Other constipation 09/01/2015  . Incisional hernia 07/08/2015  . Ventral hernia without obstruction or gangrene 05/12/2015  . Lower back pain 05/12/2015  . Spinal stenosis in cervical region 11/26/2013  . Depression 08/05/2008  . Personal history of venous thrombosis and embolism 08/05/2008  . Chronic rhinitis 12/29/2007  . Asthma 12/29/2007    Current Outpatient Medications on File Prior to Visit  Medication Sig Dispense Refill  . albuterol (PROVENTIL HFA;VENTOLIN HFA) 108 (90 BASE) MCG/ACT inhaler Inhale 2 puffs into the lungs every 6 (six) hours as needed for wheezing or shortness of breath.  1 Inhaler 0  . Aspirin-Acetaminophen-Caffeine (EXCEDRIN PO) Take 1 tablet by mouth every 6 (six) hours as needed (for migraines).     Marland Kitchen atorvastatin (LIPITOR) 20 MG tablet TAKE 1 TABLET(20 MG) BY MOUTH DAILY 90 tablet 1  . cholecalciferol (VITAMIN D) 1000 UNITS tablet Take 1,000 Units by mouth daily.    . Cyanocobalamin 1000 MCG/15ML LIQD Take 2,000 mcg by mouth every morning.     . DULoxetine (CYMBALTA) 20 MG capsule Take 30 mg by mouth daily.     . trospium (SANCTURA) 20 MG tablet Take 20 mg 2 (two) times daily by mouth.    . [DISCONTINUED] Fluticasone-Salmeterol (ADVAIR) 100-50 MCG/DOSE AEPB Inhale 1 puff into the lungs 2 (two) times daily.     No current facility-administered medications on file prior to visit.     Past Medical History:  Diagnosis Date  . Anxiety   . Arthritis   . Asthma   . Bronchitis    hx of   . Chronic headaches    [facility]  . Cystitis    1954  . Depression   . Ear pain    bilat   . Hiatal hernia   . History of blood transfusion    2010  . Hx of blood clots 2007 & 2010   post TKR  . Hyperthyroidism   . Pelvic fracture (HCC)    hx of in three places secondary to fall   . Pneumonia    hx of 1952  .  Pulmonary embolism ([place]) 1993   on HRT  . Right wrist fracture    hx of 1995 has plate secondary to fall   . Shaking    hands bilat  . Shortness of breath dyspnea   . Skin cancer    basal cell   . Spinal stenosis   . Trauma    left lower leg 10/22/2014   . Urinary frequency     Past Surgical History:  Procedure Laterality Date  . ABDOMINAL HYSTERECTOMY    . BREAST ENHANCEMENT SURGERY  1985  . CHOLECYSTECTOMY    . COLONOSCOPY     diverticulosis  . ESI   11/2013    C7-T1 ; Dr Nelva Bush  . EYE SURGERY     bilat cataract surgery   . REPLACEMENT TOTAL KNEE BILATERAL  2007 & 2010  . SP FACET INJECTION  01/19/14   RC2-3,3-4,C4-5  . TONSILLECTOMY      Social History   Socioeconomic History  . Marital status: Married    Spouse name:  None  . Number of children: 3  . Years of education: None  . Highest education level: None  Social Needs  . Financial resource strain: None  . Food insecurity - worry: None  . Food insecurity - inability: None  . Transportation needs - medical: None  . Transportation needs - non-medical: None  Occupational History  . Occupation: retired  Tobacco Use  . Smoking status: Never Smoker  . Smokeless tobacco: Never Used  Substance and Sexual Activity  . Alcohol use: Yes    Comment: minimal  . Drug use: No  . Sexual activity: None  Other Topics Concern  . None  Social History Narrative  . None    Family History  Problem Relation Age of Onset  . Allergies Brother   . Asthma Brother   . Heart disease Brother   . Clotting disorder Father        PTE X 2  . Skin cancer Father   . Skin cancer Mother   . Sudden death Unknown        2 M aunts & 2 M uncles in 47s  . Heart attack Brother        > 55  . Bladder Cancer Brother   . Diabetes Daughter   . Breast cancer Daughter   . Diabetes Son   . Mental illness Son        suicide  . Stroke Neg Hx     Review of Systems  Constitutional: Negative for chills and fever.  Respiratory: Positive for cough, shortness of breath and wheezing.        Symptoms related to asthma  Cardiovascular: Negative for chest pain, palpitations and leg swelling.  Neurological: Negative for light-headedness and headaches.       Objective:   Vitals:   11/08/17 1520  BP: 122/72  Pulse: 86  Temp: 98.2 F (36.8 C)  SpO2: (!) 82%   Wt Readings from Last 3 Encounters:  11/08/17 151 lb (68.5 kg)  08/30/17 154 lb 9.6 oz (70.1 kg)  06/12/17 157 lb (71.2 kg)   Body mass index is 23.65 kg/m.   Physical Exam    Constitutional: Appears well-developed and well-nourished. No distress.  HENT:  Head: Normocephalic and atraumatic.  Neck: Neck supple. No tracheal deviation present. No thyromegaly present.  No cervical lymphadenopathy Cardiovascular:  Normal rate, regular rhythm and normal heart sounds.   No murmur heard.   No edema Pulmonary/Chest: Effort normal and  breath sounds normal. No respiratory distress. No has no wheezes. No rales.  Skin: Skin is warm and dry. Not diaphoretic.  Psychiatric: Normal mood and affect. Behavior is normal.      Assessment & Plan:    See Problem List for Assessment and Plan of chronic medical problems.

## 2017-11-08 ENCOUNTER — Ambulatory Visit (INDEPENDENT_AMBULATORY_CARE_PROVIDER_SITE_OTHER): Payer: Medicare Other | Admitting: Internal Medicine

## 2017-11-08 ENCOUNTER — Other Ambulatory Visit (INDEPENDENT_AMBULATORY_CARE_PROVIDER_SITE_OTHER): Payer: Medicare Other

## 2017-11-08 ENCOUNTER — Encounter: Payer: Self-pay | Admitting: Internal Medicine

## 2017-11-08 DIAGNOSIS — N183 Chronic kidney disease, stage 3 unspecified: Secondary | ICD-10-CM

## 2017-11-08 DIAGNOSIS — E7849 Other hyperlipidemia: Secondary | ICD-10-CM

## 2017-11-08 DIAGNOSIS — R7303 Prediabetes: Secondary | ICD-10-CM | POA: Diagnosis not present

## 2017-11-08 LAB — COMPREHENSIVE METABOLIC PANEL
ALT: 17 U/L (ref 0–35)
AST: 20 U/L (ref 0–37)
Albumin: 4.5 g/dL (ref 3.5–5.2)
Alkaline Phosphatase: 88 U/L (ref 39–117)
BILIRUBIN TOTAL: 0.7 mg/dL (ref 0.2–1.2)
BUN: 33 mg/dL — ABNORMAL HIGH (ref 6–23)
CO2: 29 meq/L (ref 19–32)
Calcium: 9.7 mg/dL (ref 8.4–10.5)
Chloride: 102 mEq/L (ref 96–112)
Creatinine, Ser: 1.1 mg/dL (ref 0.40–1.20)
GFR: 50.32 mL/min — AB (ref >60.00)
GLUCOSE: 103 mg/dL — AB (ref 70–99)
Potassium: 4.6 mEq/L (ref 3.5–5.1)
Sodium: 139 mEq/L (ref 135–145)
Total Protein: 7.3 g/dL (ref 6.0–8.3)

## 2017-11-08 LAB — BASIC METABOLIC PANEL
BUN: 33 mg/dL — ABNORMAL HIGH (ref 6–23)
CALCIUM: 9.7 mg/dL (ref 8.4–10.5)
CO2: 29 mEq/L (ref 19–32)
CREATININE: 1.1 mg/dL (ref 0.40–1.20)
Chloride: 102 mEq/L (ref 96–112)
GFR: 50.32 mL/min — AB (ref >60.00)
Glucose, Bld: 103 mg/dL — ABNORMAL HIGH (ref 70–99)
Potassium: 4.6 mEq/L (ref 3.5–5.1)
Sodium: 139 mEq/L (ref 135–145)

## 2017-11-08 LAB — LIPID PANEL
CHOL/HDL RATIO: 3
Cholesterol: 179 mg/dL (ref 0–200)
HDL: 67.1 mg/dL (ref >39.00)
LDL CALC: 92 mg/dL (ref 0–99)
NONHDL: 112.29
Triglycerides: 101 mg/dL (ref 0.0–149.0)
VLDL: 20.2 mg/dL (ref 0.0–40.0)

## 2017-11-08 LAB — HEMOGLOBIN A1C: Hgb A1c MFr Bld: 5.6 % (ref 4.6–6.5)

## 2017-11-08 NOTE — Assessment & Plan Note (Signed)
Check lipid panel  Continue daily statin Regular exercise and healthy diet encouraged  

## 2017-11-08 NOTE — Patient Instructions (Signed)
  Test(s) ordered today. Your results will be released to MyChart (or called to you) after review, usually within 72hours after test completion. If any changes need to be made, you will be notified at that same time.    Medications reviewed and updated.  No changes recommended at this time.     

## 2017-11-08 NOTE — Assessment & Plan Note (Signed)
Check a1c Low sugar / carb diet Stressed regular exercise   

## 2017-11-14 ENCOUNTER — Telehealth: Payer: Self-pay | Admitting: Internal Medicine

## 2017-11-14 NOTE — Telephone Encounter (Signed)
Pt called and said that she is not paying the $50.00 for the no show fee  *PT NOT COMING - has had a migraine since 3am and is not able to come in* Kidney doctor wanted her to 11/01/2017-  She wants this waived

## 2017-11-15 NOTE — Telephone Encounter (Signed)
Please let patient know we will do a one time courtesy waive for her.

## 2017-11-19 ENCOUNTER — Telehealth: Payer: Self-pay | Admitting: Internal Medicine

## 2017-11-19 NOTE — Telephone Encounter (Signed)
Pt called stating she received a no show fee for $50 dollars, she called stating she will not be paying this, she could not call the day before because she did not have a migraine the day before, and Dr Quay Burow knows of her condition,  Please advise on No show Fee

## 2017-11-20 NOTE — Telephone Encounter (Signed)
Please let patient know we will do a one time courtesy refund.

## 2017-11-20 NOTE — Telephone Encounter (Signed)
Pt notified and thanked [place]

## 2018-01-08 DIAGNOSIS — R350 Frequency of micturition: Secondary | ICD-10-CM | POA: Diagnosis not present

## 2018-01-08 DIAGNOSIS — N3941 Urge incontinence: Secondary | ICD-10-CM | POA: Diagnosis not present

## 2018-01-08 DIAGNOSIS — R3915 Urgency of urination: Secondary | ICD-10-CM | POA: Diagnosis not present

## 2018-01-21 DIAGNOSIS — F064 Anxiety disorder due to known physiological condition: Secondary | ICD-10-CM | POA: Diagnosis not present

## 2018-02-10 DIAGNOSIS — N3946 Mixed incontinence: Secondary | ICD-10-CM | POA: Diagnosis not present

## 2018-02-10 DIAGNOSIS — R350 Frequency of micturition: Secondary | ICD-10-CM | POA: Diagnosis not present

## 2018-02-18 DIAGNOSIS — N952 Postmenopausal atrophic vaginitis: Secondary | ICD-10-CM | POA: Diagnosis not present

## 2018-02-18 DIAGNOSIS — M6281 Muscle weakness (generalized): Secondary | ICD-10-CM | POA: Diagnosis not present

## 2018-02-18 DIAGNOSIS — M62838 Other muscle spasm: Secondary | ICD-10-CM | POA: Diagnosis not present

## 2018-02-18 DIAGNOSIS — N393 Stress incontinence (female) (male): Secondary | ICD-10-CM | POA: Diagnosis not present

## 2018-02-18 DIAGNOSIS — M6289 Other specified disorders of muscle: Secondary | ICD-10-CM | POA: Diagnosis not present

## 2018-02-18 DIAGNOSIS — N3941 Urge incontinence: Secondary | ICD-10-CM | POA: Diagnosis not present

## 2018-02-26 DIAGNOSIS — M62838 Other muscle spasm: Secondary | ICD-10-CM | POA: Diagnosis not present

## 2018-02-26 DIAGNOSIS — R350 Frequency of micturition: Secondary | ICD-10-CM | POA: Diagnosis not present

## 2018-02-26 DIAGNOSIS — N3946 Mixed incontinence: Secondary | ICD-10-CM | POA: Diagnosis not present

## 2018-02-26 DIAGNOSIS — M6281 Muscle weakness (generalized): Secondary | ICD-10-CM | POA: Diagnosis not present

## 2018-02-26 DIAGNOSIS — M6289 Other specified disorders of muscle: Secondary | ICD-10-CM | POA: Diagnosis not present

## 2018-03-10 DIAGNOSIS — R350 Frequency of micturition: Secondary | ICD-10-CM | POA: Diagnosis not present

## 2018-03-10 DIAGNOSIS — M62838 Other muscle spasm: Secondary | ICD-10-CM | POA: Diagnosis not present

## 2018-03-10 DIAGNOSIS — M6281 Muscle weakness (generalized): Secondary | ICD-10-CM | POA: Diagnosis not present

## 2018-03-10 DIAGNOSIS — M6289 Other specified disorders of muscle: Secondary | ICD-10-CM | POA: Diagnosis not present

## 2018-03-10 DIAGNOSIS — N3946 Mixed incontinence: Secondary | ICD-10-CM | POA: Diagnosis not present

## 2018-03-10 DIAGNOSIS — R351 Nocturia: Secondary | ICD-10-CM | POA: Diagnosis not present

## 2018-03-19 ENCOUNTER — Other Ambulatory Visit (INDEPENDENT_AMBULATORY_CARE_PROVIDER_SITE_OTHER): Payer: Medicare Other

## 2018-03-19 ENCOUNTER — Ambulatory Visit (INDEPENDENT_AMBULATORY_CARE_PROVIDER_SITE_OTHER): Payer: Medicare Other | Admitting: Nurse Practitioner

## 2018-03-19 ENCOUNTER — Encounter: Payer: Self-pay | Admitting: Nurse Practitioner

## 2018-03-19 VITALS — BP 96/60 | HR 117 | Ht 67.0 in | Wt 158.0 lb

## 2018-03-19 DIAGNOSIS — K921 Melena: Secondary | ICD-10-CM | POA: Diagnosis not present

## 2018-03-19 LAB — CBC
HEMATOCRIT: 40 % (ref 36.0–46.0)
HEMOGLOBIN: 13.3 g/dL (ref 12.0–15.0)
MCHC: 33.3 g/dL (ref 30.0–36.0)
MCV: 94.3 fl (ref 78.0–100.0)
PLATELETS: 292 10*3/uL (ref 150.0–400.0)
RBC: 4.24 Mil/uL (ref 3.87–5.11)
RDW: 12.8 % (ref 11.5–15.5)
WBC: 7.3 10*3/uL (ref 4.0–10.5)

## 2018-03-19 NOTE — Progress Notes (Signed)
Reviewed and agree with initial management plan.  Keiron Iodice T. Taivon Haroon, MD FACG 

## 2018-03-19 NOTE — Progress Notes (Signed)
      IMPRESSION and PLAN:     32. 84 yo female here with complaints of black stool for a month. Takes daily baby asa. No abdominal pain or nausea / vomiting. On exam stools are light brown. No hemoccult developer to check for blood.    -she doesn't take iron or bismuth. Eats a lot of spinach which can cause darker stools in some people. Stool light brown on exam today.  -cbc today. Will call with results.   2. Chronic constipation, stable. Takes Smooth Move tea once weekly  HPI:    Chief Complaint: dark stools   Patient is an 84 yo female known tot Dr. Fuller Plan. She has a hx of hemorrhoids and mild diverticulosis. Up to date on colon cancer screening, no polyps on last one in  2016. Patient is here today for evaluation of  dark brown stools present for about a month. Stools are otherwise normal. No bismuth or iron. Takes a baby asa every day, no other NSAIDs. Excedrin on med list but none in 3 months.  Able to walks 2-3 miles a day. She gets heartburn at night 1-2 times a week. Takes a med for GERD as needed and sometimes just takes baking soda with water. She has occasional constipation relieved with smooth move tea. Intentionally lost 20 pounds, stopped drinking milk.   Review of systems:    Positive for fatigue.  No SOB. No dizziness.   Past Medical History:  Diagnosis Date  . Anxiety   . Arthritis   . Asthma   . Bronchitis    hx of   . Chronic headaches    [facility]  . Cystitis    1954  . Depression   . Ear pain    bilat   . Hiatal hernia   . History of blood transfusion    2010  . Hx of blood clots 2007 & 2010   post TKR  . Hyperthyroidism   . Pelvic fracture (HCC)    hx of in three places secondary to fall   . Pneumonia    hx of 1952  . Pulmonary embolism ([place]) 1993   on HRT  . Right wrist fracture    hx of 1995 has plate secondary to fall   . Shaking    hands bilat  . Shortness of breath dyspnea   . Skin cancer    basal cell   . Spinal stenosis   .  Trauma    left lower leg 10/22/2014   . Urinary frequency     Patient's surgical history, family medical history, social history, medications and allergies were all reviewed in Epic    Physical Exam:     BP 96/60   Pulse (!) 117   Ht 5\' 7"  (1.702 m)   Wt 158 lb (71.7 kg)   BMI 24.75 kg/m   GENERAL:  Well developed white female in NAD PSYCH: :Pleasant, cooperative, normal affect EENT:  conjunctiva pink, mucous membranes moist, neck supple without masses CARDIAC:  RRR,  no peripheral edema PULM: Normal respiratory effort, lungs CTA bilaterally, no wheezing ABDOMEN:  Nondistended, soft, nontender. No obvious masses, no hepatomegaly,  normal bowel sounds RECTAL: light brown stool in vault SKIN:  turgor, no lesions seen Musculoskeletal:  Normal muscle tone, normal strength NEURO: Alert and oriented x 3, no focal neurologic deficits   Tye Savoy , NP 03/19/2018, 2:26 PM

## 2018-03-19 NOTE — Patient Instructions (Signed)
If you are age 82 or older, your body mass index should be between 23-30. Your Body mass index is 24.75 kg/m. If this is out of the aforementioned range listed, please consider follow up with your Primary Care Provider.  If you are age 40 or younger, your body mass index should be between 19-25. Your Body mass index is 24.75 kg/m. If this is out of the aformentioned range listed, please consider follow up with your Primary Care Provider.   Your physician has requested that you go to the basement for the following lab work before leaving today: CBC  Thank you for choosing [facility]  Tye Savoy, NP

## 2018-03-24 ENCOUNTER — Telehealth: Payer: Self-pay | Admitting: Nurse Practitioner

## 2018-03-24 NOTE — Telephone Encounter (Signed)
Pt is looking for lab results

## 2018-03-24 NOTE — Telephone Encounter (Signed)
Patient is advised of normal results.

## 2018-04-15 ENCOUNTER — Ambulatory Visit (INDEPENDENT_AMBULATORY_CARE_PROVIDER_SITE_OTHER): Payer: Medicare Other | Admitting: Internal Medicine

## 2018-04-15 ENCOUNTER — Encounter: Payer: Self-pay | Admitting: Internal Medicine

## 2018-04-15 DIAGNOSIS — J4521 Mild intermittent asthma with (acute) exacerbation: Secondary | ICD-10-CM

## 2018-04-15 MED ORDER — BENZONATATE 200 MG PO CAPS: 200.0000 mg | ORAL_CAPSULE | Freq: Three times a day (TID) | ORAL | 0 refills | Status: DC | PRN

## 2018-04-15 MED ORDER — DOXYCYCLINE HYCLATE 100 MG PO TABS: 100.0000 mg | ORAL_TABLET | Freq: Two times a day (BID) | ORAL | 0 refills | Status: DC

## 2018-04-15 NOTE — Progress Notes (Signed)
   Subjective:    Patient ID: Faith Edwards, female    DOB: 12/11/1932, 84 y.o.   MRN: [id number]  HPI The patient is an 84 YO female coming in for cough for about 1 week or so. Productive of green sputum. Denies fevers but some chills. Overall worsening. Taking mucinex over the counter and allergy medicine which do not seem to be helping. Some nose congestion and drainage. Mild sinus pressure and headaches. Denies ear pain or drainage. Does have asthma but denies using albuterol inhaler more often. Tired and activity is limited but not getting SOB or wheezing.   Review of Systems  Constitutional: Positive for activity change, appetite change and chills. Negative for fatigue, fever and unexpected weight change.  HENT: Positive for congestion, postnasal drip, rhinorrhea and sinus pressure. Negative for ear discharge, ear pain, sinus pain, sneezing, sore throat, tinnitus, trouble swallowing and voice change.   Eyes: Negative.   Respiratory: Positive for cough. Negative for chest tightness, shortness of breath and wheezing.   Cardiovascular: Negative.   Gastrointestinal: Negative.   Musculoskeletal: Negative.   Neurological: Positive for headaches.      Objective:   Physical Exam  Constitutional: She is oriented to person, place, and time. She appears well-developed and well-nourished.  HENT:  Head: Normocephalic and atraumatic.  Oropharynx with redness and clear drainage, nose with swollen turbinates, TMs normal bilaterally  Eyes: EOM are normal.  Neck: Normal range of motion. No thyromegaly present.  Cardiovascular: Normal rate and regular rhythm.  Pulmonary/Chest: Effort normal. No respiratory distress. She has wheezes. She has no rales.  Mild wheezing bilaterally  Abdominal: Soft.  Musculoskeletal: She exhibits no tenderness.  Lymphadenopathy:    She has no cervical adenopathy.  Neurological: She is alert and oriented to person, place, and time.  Skin: Skin is warm and dry.    Vitals:   04/15/18 1540  BP: 108/60  Pulse: 89  Temp: 98.2 F (36.8 C)  TempSrc: Oral  SpO2: 97%  Weight: 158 lb (71.7 kg)  Height: 5\' 7"  (1.702 m)      Assessment & Plan:

## 2018-04-15 NOTE — Patient Instructions (Addendum)
We have sent in doxycycline to take 1 pill twice a day for 1 week.  Get some allegra over the counter to take daily for 1-2 weeks if needed during this time.   We have sent in tessalon perles for cough that you can use if needed up to 3 times per day.   Call us back if you are not getting better.

## 2018-04-16 NOTE — Assessment & Plan Note (Signed)
Treat with doxycyline and tessalon perles given some lung findings and time course. Advised to use albuterol inhaler as needed for SOB.

## 2018-04-21 DIAGNOSIS — F064 Anxiety disorder due to known physiological condition: Secondary | ICD-10-CM | POA: Diagnosis not present

## 2018-05-07 NOTE — Progress Notes (Signed)
Subjective:    Patient ID: Faith Edwards, female    DOB: [birth year], 84 y.o.   MRN: [id number]  HPI The patient is here for follow up.  Hyperlipidemia: She is taking her medication daily. She is compliant with a low fat/cholesterol diet. She is exercising - walking 2 miles a day. She denies myalgias.   CKD:  She is drinking lots of water.  She does not take any nsaids.    Prediabetes:  She is compliant with a low sugar/carbohydrate diet.  She is exercising regularly.  Dysuria:  She has been having dysuria for a couple of days.  She denies any change in her urinary frequency or blood in the urine.  She denies any nausea or abdominal pain.  She is concerned about the possibility of the urinary tract infection.  Her husband had a heart attack in the past few months and there is an increase in stress.  She is doing more around the house.   Medications and allergies reviewed with patient and updated if appropriate.  Patient Active Problem List   Diagnosis Date Noted  . Prediabetes 05/09/2017  . CKD (chronic kidney disease) stage 3, GFR 30-59 ml/min (HCC) 11/08/2016  . Bursitis of right knee 08/07/2016  . Herpes zoster 06/02/2016  . Upper airway cough syndrome 06/01/2016  . Hyperlipidemia 03/22/2016  . Choking 03/15/2016  . GERD (gastroesophageal reflux disease) 03/15/2016  . Graves disease 01/04/2016  . Osteopenia, severe 10/14/2015  . Other constipation 09/01/2015  . Incisional hernia 07/08/2015  . Ventral hernia without obstruction or gangrene 05/12/2015  . Lower back pain 05/12/2015  . Spinal stenosis in cervical region 11/26/2013  . Depression 08/05/2008  . Personal history of venous thrombosis and embolism 08/05/2008  . Chronic rhinitis 12/29/2007  . Asthma 12/29/2007    Current Outpatient Medications on File Prior to Visit  Medication Sig Dispense Refill  . albuterol (PROVENTIL HFA;VENTOLIN HFA) 108 (90 BASE) MCG/ACT inhaler Inhale 2 puffs into the lungs every 6  (six) hours as needed for wheezing or shortness of breath. 1 Inhaler 0  . Aspirin-Acetaminophen-Caffeine (EXCEDRIN PO) Take 1 tablet by mouth every 6 (six) hours as needed (for migraines).     Marland Kitchen atorvastatin (LIPITOR) 20 MG tablet TAKE 1 TABLET(20 MG) BY MOUTH DAILY 90 tablet 1  . cholecalciferol (VITAMIN D) 1000 UNITS tablet Take 1,000 Units by mouth daily.    . Cyanocobalamin 1000 MCG/15ML LIQD Take 2,000 mcg by mouth every morning.     Marland Kitchen doxylamine, Sleep, (UNISOM) 25 MG tablet Take 25 mg by mouth at bedtime as needed.    . DULoxetine (CYMBALTA) 30 MG capsule TK ONE C PO QD  4  . [DISCONTINUED] Fluticasone-Salmeterol (ADVAIR) 100-50 MCG/DOSE AEPB Inhale 1 puff into the lungs 2 (two) times daily.     No current facility-administered medications on file prior to visit.     Past Medical History:  Diagnosis Date  . Anxiety   . Arthritis   . Asthma   . Bronchitis    hx of   . [facility]    [facility]  . Cystitis    1954  . Depression   . Ear pain    bilat   . Hiatal hernia   . History of blood transfusion    2010  . Hx of blood clots 2007 & 2010   post TKR  . Hyperthyroidism   . Pelvic fracture (HCC)    hx of in three places secondary to fall   .  Pneumonia    hx of 1952  . Pulmonary embolism ([place]) 1993   on HRT  . Right wrist fracture    hx of 1995 has plate secondary to fall   . Shaking    hands bilat  . Shortness of breath dyspnea   . Skin cancer    basal cell   . Spinal stenosis   . Trauma    left lower leg 10/22/2014   . Urinary frequency     Past Surgical History:  Procedure Laterality Date  . ABDOMINAL HYSTERECTOMY    . BREAST ENHANCEMENT SURGERY  1985  . CHOLECYSTECTOMY    . COLONOSCOPY     diverticulosis  . ESI   11/2013    C7-T1 ; Dr Nelva Bush  . EYE SURGERY     bilat cataract surgery   . INSERTION OF MESH N/A 07/08/2015   Procedure: INSERTION OF MESH;  Surgeon: Armandina Gemma, MD;  Location: [facility];  Service: General;  Laterality: N/A;  .  REPLACEMENT TOTAL KNEE BILATERAL  2007 & 2010  . SP FACET INJECTION  01/19/14   RC2-3,3-4,C4-5  . TONSILLECTOMY    . VENTRAL HERNIA REPAIR N/A 07/08/2015   Procedure: VENTRAL ADULT REPAIR VENTRAL INCISIONAL HERNIA REPAIR;  Surgeon: Armandina Gemma, MD;  Location: [facility];  Service: General;  Laterality: N/A;    Social History   Socioeconomic History  . Marital status: Married    Spouse name: Not on file  . Number of children: 3  . Years of education: Not on file  . Highest education level: Not on file  Occupational History  . Occupation: retired  Scientific laboratory technician  . Financial resource strain: Not on file  . Food insecurity:    Worry: Not on file    Inability: Not on file  . Transportation needs:    Medical: Not on file    Non-medical: Not on file  Tobacco Use  . Smoking status: Never Smoker  . Smokeless tobacco: Never Used  Substance and Sexual Activity  . Alcohol use: Yes    Comment: minimal  . Drug use: No  . Sexual activity: Not on file  Lifestyle  . Physical activity:    Days per week: Not on file    Minutes per session: Not on file  . Stress: Not on file  Relationships  . Social connections:    Talks on phone: Not on file    Gets together: Not on file    Attends religious service: Not on file    Active member of club or organization: Not on file    Attends meetings of clubs or organizations: Not on file    Relationship status: Not on file  Other Topics Concern  . Not on file  Social History Narrative  . Not on file    Family History  Problem Relation Age of Onset  . Allergies Brother   . Asthma Brother   . Heart disease Brother   . Clotting disorder Father        PTE X 2  . Skin cancer Father   . Skin cancer Mother   . Sudden death Unknown        2 M aunts & 2 M uncles in 98s  . Heart attack Brother        > 55  . Bladder Cancer Brother   . Diabetes Daughter   . Breast cancer Daughter   . Diabetes Son   . Mental illness Son  suicide  . Stroke Neg  Hx     Review of Systems  Constitutional: Negative for chills and fever.  Respiratory: Positive for cough (allergies), shortness of breath (related to allergies) and wheezing (allergy related).   Cardiovascular: Positive for palpitations (occ, chronic). Negative for chest pain and leg swelling.  Genitourinary: Positive for dysuria. Negative for hematuria.  Neurological: Positive for headaches. Negative for light-headedness.       Objective:   Vitals:   05/08/18 1038  BP: 120/72  Pulse: 100  Resp: 16  Temp: 97.6 F (36.4 C)  SpO2: 98%   BP Readings from Last 3 Encounters:  05/08/18 120/72  04/15/18 108/60  03/19/18 96/60   Wt Readings from Last 3 Encounters:  05/08/18 159 lb (72.1 kg)  04/15/18 158 lb (71.7 kg)  03/19/18 158 lb (71.7 kg)   Body mass index is 24.9 kg/m.   Physical Exam    Constitutional: Appears well-developed and well-nourished. No distress.  HENT:  Head: Normocephalic and atraumatic.  Neck: Neck supple. No tracheal deviation present. No thyromegaly present.  No cervical lymphadenopathy Cardiovascular: Normal rate, regular rhythm and normal heart sounds.    No carotid bruit .  1+ bilateral lower extremity edema with bilateral lower extremity varicose veins Pulmonary/Chest: Effort normal and breath sounds normal. No respiratory distress. No has no wheezes. No rales.  Skin: Skin is warm and dry. Not diaphoretic.  Psychiatric: Normal mood and affect. Behavior is normal.      Assessment & Plan:    See Problem List for Assessment and Plan of chronic medical problems.

## 2018-05-07 NOTE — Patient Instructions (Addendum)

## 2018-05-08 ENCOUNTER — Encounter: Payer: Self-pay | Admitting: Internal Medicine

## 2018-05-08 ENCOUNTER — Other Ambulatory Visit (INDEPENDENT_AMBULATORY_CARE_PROVIDER_SITE_OTHER): Payer: Medicare Other

## 2018-05-08 ENCOUNTER — Ambulatory Visit (INDEPENDENT_AMBULATORY_CARE_PROVIDER_SITE_OTHER): Payer: Medicare Other | Admitting: Internal Medicine

## 2018-05-08 ENCOUNTER — Other Ambulatory Visit: Payer: Self-pay | Admitting: Internal Medicine

## 2018-05-08 ENCOUNTER — Telehealth: Payer: Self-pay | Admitting: Internal Medicine

## 2018-05-08 VITALS — BP 120/72 | HR 100 | Temp 97.6°F | Resp 16 | Wt 159.0 lb

## 2018-05-08 DIAGNOSIS — N183 Chronic kidney disease, stage 3 unspecified: Secondary | ICD-10-CM

## 2018-05-08 DIAGNOSIS — R300 Dysuria: Secondary | ICD-10-CM

## 2018-05-08 DIAGNOSIS — E7849 Other hyperlipidemia: Secondary | ICD-10-CM

## 2018-05-08 DIAGNOSIS — R7303 Prediabetes: Secondary | ICD-10-CM

## 2018-05-08 LAB — URINALYSIS, ROUTINE W REFLEX MICROSCOPIC
Bilirubin Urine: NEGATIVE
Ketones, ur: NEGATIVE
Nitrite: NEGATIVE
Specific Gravity, Urine: 1.02 (ref 1.000–1.030)
Total Protein, Urine: NEGATIVE
Urine Glucose: NEGATIVE
Urobilinogen, UA: 0.2 (ref 0.0–1.0)
pH: 6 (ref 5.0–8.0)

## 2018-05-08 LAB — LIPID PANEL
Cholesterol: 225 mg/dL — ABNORMAL HIGH (ref 0–200)
HDL: 72.1 mg/dL (ref >39.00)
LDL Cholesterol: 136 mg/dL — ABNORMAL HIGH (ref 0–99)
NonHDL: 152.97
Total CHOL/HDL Ratio: 3
Triglycerides: 85 mg/dL (ref 0.0–149.0)
VLDL: 17 mg/dL (ref 0.0–40.0)

## 2018-05-08 LAB — CBC WITH DIFFERENTIAL/PLATELET
BASOS PCT: 0.8 % (ref 0.0–3.0)
Basophils Absolute: 0.1 10*3/uL (ref 0.0–0.1)
Eosinophils Absolute: 0.1 10*3/uL (ref 0.0–0.7)
Eosinophils Relative: 1.2 % (ref 0.0–5.0)
HEMATOCRIT: 39.4 % (ref 36.0–46.0)
Hemoglobin: 13.1 g/dL (ref 12.0–15.0)
LYMPHS PCT: 23.6 % (ref 12.0–46.0)
Lymphs Abs: 2.1 10*3/uL (ref 0.7–4.0)
MCHC: 33.2 g/dL (ref 30.0–36.0)
MCV: 94.5 fl (ref 78.0–100.0)
Monocytes Absolute: 1 10*3/uL (ref 0.1–1.0)
Monocytes Relative: 10.7 % (ref 3.0–12.0)
NEUTROS ABS: 5.7 10*3/uL (ref 1.4–7.7)
Neutrophils Relative %: 63.7 % (ref 43.0–77.0)
PLATELETS: 317 10*3/uL (ref 150.0–400.0)
RBC: 4.17 Mil/uL (ref 3.87–5.11)
RDW: 13.1 % (ref 11.5–15.5)
WBC: 8.9 10*3/uL (ref 4.0–10.5)

## 2018-05-08 LAB — COMPREHENSIVE METABOLIC PANEL
ALBUMIN: 4 g/dL (ref 3.5–5.2)
ALK PHOS: 102 U/L (ref 39–117)
ALT: 14 U/L (ref 0–35)
AST: 18 U/L (ref 0–37)
BUN: 23 mg/dL (ref 6–23)
CO2: 29 mEq/L (ref 19–32)
CREATININE: 1.02 mg/dL (ref 0.40–1.20)
Calcium: 9.2 mg/dL (ref 8.4–10.5)
Chloride: 104 mEq/L (ref 96–112)
GFR: 54.84 mL/min — ABNORMAL LOW (ref >60.00)
Glucose, Bld: 87 mg/dL (ref 70–99)
POTASSIUM: 4.7 meq/L (ref 3.5–5.1)
Sodium: 138 mEq/L (ref 135–145)
TOTAL PROTEIN: 6.9 g/dL (ref 6.0–8.3)
Total Bilirubin: 0.7 mg/dL (ref 0.2–1.2)

## 2018-05-08 LAB — HEMOGLOBIN A1C: Hgb A1c MFr Bld: 5.7 % (ref 4.6–6.5)

## 2018-05-08 MED ORDER — NITROFURANTOIN MONOHYD MACRO 100 MG PO CAPS: 100.0000 mg | ORAL_CAPSULE | Freq: Two times a day (BID) | ORAL | 0 refills | Status: DC

## 2018-05-08 NOTE — Assessment & Plan Note (Signed)
Check lipid panel  Continue daily statin Regular exercise and healthy diet encouraged  

## 2018-05-08 NOTE — Assessment & Plan Note (Signed)
Check a1c Low sugar / carb diet Stressed regular exercise   

## 2018-05-08 NOTE — Assessment & Plan Note (Signed)
Will check urinalysis, urine culture to rule out UTI

## 2018-05-08 NOTE — Telephone Encounter (Signed)
When resulted, can these be mailed to her? Thanks!

## 2018-05-08 NOTE — Assessment & Plan Note (Signed)
Continue increased water intake Avoid NSAIDs CMP

## 2018-05-08 NOTE — Telephone Encounter (Signed)
Copied from [place] [id number]. Topic: Quick Communication - See Telephone Encounter >> May 08, 2018  3:29 PM Rutherford Nail, [place] wrote: CRM for notification. See Telephone encounter for: 05/08/18. Patient calling and is requesting the lab results from today (05/08/18) be printed and mailed to her. Address verified. Please advise. CB#: [phone]

## 2018-05-09 ENCOUNTER — Encounter: Payer: Self-pay | Admitting: Emergency Medicine

## 2018-05-09 NOTE — Telephone Encounter (Signed)
Results mailed 

## 2018-05-10 LAB — URINE CULTURE
MICRO NUMBER:: 90567230
SPECIMEN QUALITY:: ADEQUATE

## 2018-05-12 DIAGNOSIS — N952 Postmenopausal atrophic vaginitis: Secondary | ICD-10-CM | POA: Diagnosis not present

## 2018-05-12 DIAGNOSIS — N3946 Mixed incontinence: Secondary | ICD-10-CM | POA: Diagnosis not present

## 2018-05-27 ENCOUNTER — Other Ambulatory Visit: Payer: Self-pay | Admitting: Internal Medicine

## 2018-07-07 ENCOUNTER — Encounter: Payer: Self-pay | Admitting: Internal Medicine

## 2018-07-07 ENCOUNTER — Ambulatory Visit (INDEPENDENT_AMBULATORY_CARE_PROVIDER_SITE_OTHER): Payer: Medicare Other | Admitting: Internal Medicine

## 2018-07-07 VITALS — BP 100/60 | HR 103 | Ht 67.0 in | Wt 163.4 lb

## 2018-07-07 DIAGNOSIS — R221 Localized swelling, mass and lump, neck: Secondary | ICD-10-CM

## 2018-07-07 DIAGNOSIS — E0500 Thyrotoxicosis with diffuse goiter without thyrotoxic crisis or storm: Secondary | ICD-10-CM

## 2018-07-07 NOTE — Patient Instructions (Signed)
Please continue off Methimazole.  Please stop at the lab.  Please let me know if pulse is >90 at rest.  Please come back for a follow-up appointment in 1 year.

## 2018-07-07 NOTE — Progress Notes (Signed)
Patient ID: Faith Edwards, female   DOB: 01-17-1932, 84 y.o.   MRN: [id number]   HPI  Faith Edwards is a 84 y.o.-year-old female, returning for f/u for Graves ds. Last visit 10 months ago.  She scheduled this appointment because she feels very tired and feels that she has a nodule in her neck.She felt a R neck mass  - painful. This is improving.   Her husband (who is an Arboriculturist) had a heart attack in 12/2017.  She was very stressed at that time and ate more. Before last visit, she lost 16 pounds by avoiding milk, sweets, and reducing meat.  Since last visit, she gained 4 lbs.    Reviewed patient's TFTs: Lab Results  Component Value Date   TSH 2.07 08/30/2017   TSH 2.01 08/30/2016   TSH 3.08 04/06/2016   TSH 2.20 01/04/2016   TSH 3.09 11/16/2015   TSH 0.04 (L) 09/29/2015   TSH 0.02 (L) 09/19/2015   TSH 0.04 (L) 09/12/2015   TSH 4.09 01/07/2014   TSH 3.54 05/07/2012   FREET4 0.81 08/30/2017   FREET4 0.79 08/30/2016   FREET4 0.77 04/06/2016   FREET4 0.79 01/04/2016   FREET4 0.62 11/16/2015   FREET4 1.02 09/29/2015   FREET4 0.9 08/05/2008    TSI levels remained high: Lab Results  Component Value Date   TSI 524 (H) 04/06/2016   TSI 531 (H) 09/29/2015   Since the TSI levels were high, we skipped the thyroid uptake and scan and started her on methimazole.  In 11/2015, we decrease the methimazole to 5 mg every other day and then she stopped for herself in 02/2016.  Subsequent TFTs have been normal.  She has previously been on atenolol, which was stopped almost 3 years ago.  At last visit, we discussed that she may need to restart this if her sugars at home or higher than 90, but she did not contact me since then and she did not start it.  Pt denies: - hoarseness - dysphagia - SOB with lying down  But she does have: -Cough-related to allergies "I am allergic to everything" -Choking  She does complain of -Fatigue -Resolved Insomnia-- started to take Cymbalta at night -Heat  intolerance  -Hair loss  Pt does have a FH of thyroid ds.: her mother. No FH of thyroid cancer. No h/o radiation tx to head or neck.  No recent contrast studies (last CT with contrast 06/17/2017). No herbal supplements. No Biotin use. No recent steroids use.   She has COPD.  She took a trip to [place] and [place] after our last visit.  ROS: Constitutional: No weight gain, or weight loss + See HPI, + nocturia Eyes: no blurry vision, no xerophthalmia ENT: no sore throat, + see HPI Cardiovascular: no CP/+ SOB/no palpitations/no leg swelling Respiratory: + cough (allergies)/+ SOB/no wheezing Gastrointestinal: no N/no V/no D/+ C/+ acid reflux Musculoskeletal: no muscle aches/no joint aches Skin: no rashes, + hair loss Neurological: no tremors/no numbness/no tingling/no dizziness, + headache  I reviewed pt's medications, allergies, PMH, social hx, family hx, and changes were documented in the history of present illness. Otherwise, unchanged from my initial visit note.  Past Medical History:  Diagnosis Date  . Anxiety   . Arthritis   . Asthma   . Bronchitis    hx of   . [facility] headaches    [facility]  . Cystitis    1954  . Depression   . Ear pain    bilat   .  Hiatal hernia   . History of blood transfusion    2010  . Hx of blood clots 2007 & 2010   post TKR  . Hyperthyroidism   . Pelvic fracture (HCC)    hx of in three places secondary to fall   . Pneumonia    hx of 1952  . Pulmonary embolism ([place]) 1993   on HRT  . Right wrist fracture    hx of 1995 has plate secondary to fall   . Shaking    hands bilat  . Shortness of breath dyspnea   . Skin cancer    basal cell   . Spinal stenosis   . Trauma    left lower leg 10/22/2014   . Urinary frequency    Past Surgical History:  Procedure Laterality Date  . ABDOMINAL HYSTERECTOMY    . BREAST ENHANCEMENT SURGERY  1985  . CHOLECYSTECTOMY    . COLONOSCOPY     diverticulosis  . ESI   11/2013    C7-T1 ; Dr Nelva Bush  .  EYE SURGERY     bilat cataract surgery   . INSERTION OF MESH N/A 07/08/2015   Procedure: INSERTION OF MESH;  Surgeon: Armandina Gemma, MD;  Location: [facility];  Service: General;  Laterality: N/A;  . REPLACEMENT TOTAL KNEE BILATERAL  2007 & 2010  . SP FACET INJECTION  01/19/14   RC2-3,3-4,C4-5  . TONSILLECTOMY    . VENTRAL HERNIA REPAIR N/A 07/08/2015   Procedure: VENTRAL ADULT REPAIR VENTRAL INCISIONAL HERNIA REPAIR;  Surgeon: Armandina Gemma, MD;  Location: [facility];  Service: General;  Laterality: N/A;   Social History   Socioeconomic History  . Marital status: Married    Spouse name: Not on file  . Number of children: 3  . Years of education: Not on file  . Highest education level: Not on file  Occupational History  . Occupation: retired  Scientific laboratory technician  . Financial resource strain: Not on file  . Food insecurity:    Worry: Not on file    Inability: Not on file  . Transportation needs:    Medical: Not on file    Non-medical: Not on file  Tobacco Use  . Smoking status: Never Smoker  . Smokeless tobacco: Never Used  Substance and Sexual Activity  . Alcohol use: Yes    Comment: minimal  . Drug use: No  . Sexual activity: Not on file  Lifestyle  . Physical activity:    Days per week: Not on file    Minutes per session: Not on file  . Stress: Not on file  Relationships  . Social connections:    Talks on phone: Not on file    Gets together: Not on file    Attends religious service: Not on file    Active member of club or organization: Not on file    Attends meetings of clubs or organizations: Not on file    Relationship status: Not on file  . Intimate partner violence:    Fear of current or ex partner: Not on file    Emotionally abused: Not on file    Physically abused: Not on file    Forced sexual activity: Not on file  Other Topics Concern  . Not on file  Social History Narrative  . Not on file   Current Outpatient Medications on File Prior to Visit  Medication Sig Dispense  Refill  . albuterol (PROVENTIL HFA;VENTOLIN HFA) 108 (90 BASE) MCG/ACT inhaler Inhale 2 puffs into the  lungs every 6 (six) hours as needed for wheezing or shortness of breath. 1 Inhaler 0  . Aspirin-Acetaminophen-Caffeine (EXCEDRIN PO) Take 1 tablet by mouth every 6 (six) hours as needed (for migraines).     Marland Kitchen atorvastatin (LIPITOR) 20 MG tablet TAKE 1 TABLET(20 MG) BY MOUTH DAILY 90 tablet 1  . cholecalciferol (VITAMIN D) 1000 UNITS tablet Take 1,000 Units by mouth daily.    . Cyanocobalamin 1000 MCG/15ML LIQD Take 2,000 mcg by mouth every morning.     Marland Kitchen doxylamine, Sleep, (UNISOM) 25 MG tablet Take 25 mg by mouth at bedtime as needed.    . DULoxetine (CYMBALTA) 30 MG capsule TK ONE C PO QD  4  . nitrofurantoin, macrocrystal-monohydrate, (MACROBID) 100 MG capsule Take 1 capsule (100 mg total) by mouth 2 (two) times daily. 14 capsule 0  . [DISCONTINUED] Fluticasone-Salmeterol (ADVAIR) 100-50 MCG/DOSE AEPB Inhale 1 puff into the lungs 2 (two) times daily.     No current facility-administered medications on file prior to visit.    Allergies  Allergen Reactions  . Penicillins Hives  . Sulfa Antibiotics Hives and Nausea And Vomiting    7-15   Family History  Problem Relation Age of Onset  . Allergies Brother   . Asthma Brother   . Heart disease Brother   . Clotting disorder Father        PTE X 2  . Skin cancer Father   . Skin cancer Mother   . Sudden death Unknown        2 M aunts & 2 M uncles in 30s  . Heart attack Brother        > 55  . Bladder Cancer Brother   . Diabetes Daughter   . Breast cancer Daughter   . Diabetes Son   . Mental illness Son        suicide  . Stroke Neg Hx    PE: BP 100/60   Pulse (!) 103   Ht 5\' 7"  (1.702 m)   Wt 163 lb 6.4 oz (74.1 kg)   SpO2 98%   BMI 25.59 kg/m  Body mass index is 25.59 kg/m. Wt Readings from Last 3 Encounters:  07/07/18 163 lb 6.4 oz (74.1 kg)  05/08/18 159 lb (72.1 kg)  04/15/18 158 lb (71.7 kg)   Constitutional: Normal  weight, in NAD Eyes: PERRLA, EOMI, no exophthalmos ENT: moist mucous membranes, no thyromegaly, no cervical lymphadenopathy, but I can feel a right inferior (~3 mm) cervical lymph node (which does not appear enlarged though) on palpation. Cardiovascular: tachycardia, RR, No MRG Respiratory: CTA B Gastrointestinal: abdomen soft, NT, ND, BS+ Musculoskeletal: no deformities, strength intact in all 4 Skin: moist, warm, no rashes Neurological: + Tremor with outstretched hands, DTR normal in all 4  ASSESSMENT: 1. Graves ds.  2.  Neck nodule  PLAN:  1. Patient with history of thyrotoxicosis, with high TSI levels, most likely Graves' disease.  Because of the timeline and the elevated Graves' antibodies with skipped a thyroid uptake and scan. - She has been on methimazole in the past but we were able to stop this in 02/2016 with subsequent normal TFTs. - At last visit, thyroid tests were normal, but her pulse was higher than 90.  She was telling me that she was excited that she just found out that she would go to [place] in 1 month after her last visit.  I advised her to let me know if pulse at home is higher than 90, in which  case we needed to restart atenolol.  She did called me afterwards and did not start the methimazole, however, at today's visit, her pulse is even higher, at 103.  At this point, we discussed that she will need to be on a beta-blocker if the thyroid tests are abnormal, however, she will need to see her cardiologist if the thyroid tests are normal to look for other etiologies or treatments.  We have to be careful with beta-blockers since she does have COPD. -Today we will check the following labs Orders Placed This Encounter  Procedures  . TSH  . T4, free  . T3, free  . Thyroid stimulating immunoglobulin  - I will see her back in a year, but have her back in 6 months for recheck of her TFTs  2.  Neck nodule - I can feel a very small lymph node on palpation of the right  inferior cervical region, lateral to the thyroid.  She mentions that this has increased in size an does not bother her anymore.  - I advised her to wait another 2 weeks to see if the discomfort disappears completely and if not, to let me know.  We may need to get a thyroid ultrasound.  Component     Latest Ref Rng & Units 07/07/2018  TSH     0.35 - 4.50 uIU/mL 2.32  T4,Free(Direct)     0.60 - 1.60 ng/dL 0.86  Triiodothyronine,Free,Serum     2.3 - 4.2 pg/mL 3.3  TSI     <140 % baseline 442 (H)  Labs are normal. TSI's are still high, but decreased from before. I would suggest to have further investigation for her tachycardia.  With normal TFTs, I doubt that this is related to her Graves' disease.  Philemon Kingdom, MD PhD [facility]

## 2018-07-08 ENCOUNTER — Telehealth: Payer: Self-pay | Admitting: Internal Medicine

## 2018-07-08 DIAGNOSIS — Z889 Allergy status to unspecified drugs, medicaments and biological substances status: Secondary | ICD-10-CM

## 2018-07-08 LAB — TSH: TSH: 2.32 u[IU]/mL (ref 0.35–4.50)

## 2018-07-08 LAB — T4, FREE: Free T4: 0.86 ng/dL (ref 0.60–1.60)

## 2018-07-08 LAB — T3, FREE: T3 FREE: 3.3 pg/mL (ref 2.3–4.2)

## 2018-07-08 NOTE — Telephone Encounter (Signed)
Patient would like for you to give her a call regarding an allergy referral.

## 2018-07-09 ENCOUNTER — Telehealth: Payer: Self-pay | Admitting: Emergency Medicine

## 2018-07-09 NOTE — Telephone Encounter (Signed)
ordered

## 2018-07-09 NOTE — Telephone Encounter (Signed)
Pt is ready to schedule a wellness visit. Please contact pt.

## 2018-07-09 NOTE — Telephone Encounter (Signed)
Spoke with pt, she would like a referral entered to see an allergy doctor. She feels as if she as many allergies to multiple things.

## 2018-07-10 LAB — THYROID STIMULATING IMMUNOGLOBULIN: TSI: 442 % baseline — ABNORMAL HIGH (ref <140)

## 2018-07-17 NOTE — Progress Notes (Signed)
Subjective:   Faith Edwards is a 84 y.o. female who presents for Medicare Annual (Subsequent) preventive examination.  Review of Systems:  No ROS.  Medicare Wellness Visit. Additional risk factors are reflected in the social history.  Cardiac Risk Factors include: advanced age (>67men, >65 women);dyslipidemia;hypertension Sleep patterns: has restless sleep, has frequent nighttime awakenings, gets up 1-2 times nightly to void and sleeps 4-6 hours nightly. Patient reports insomnia issues, discussed recommended sleep tips.   Home Safety/Smoke Alarms: Feels safe in home. Smoke alarms in place.  Living environment; residence and Firearm Safety: 2-story house, no firearms.Lives with husband, no needs for DME, good support system Seat Belt Safety/Bike Helmet: Wears seat belt.      Objective:     Vitals: BP 112/68   Pulse 73   Resp 18   Ht 5\' 7"  (1.702 m)   Wt 162 lb (73.5 kg)   SpO2 97%   BMI 25.37 kg/m   Body mass index is 25.37 kg/m.  Advanced Directives 07/18/2018 05/04/2017 09/06/2015 07/06/2015 10/10/2014  Does Patient Have a Medical Advance Directive? No No No No No  Would patient like information on creating a medical advance directive? Yes (ED - Information included in AVS) - - No - patient declined information No - patient declined information    Tobacco Social History   Tobacco Use  Smoking Status Never Smoker  Smokeless Tobacco Never Used     Counseling given: Not Answered  Past Medical History:  Diagnosis Date  . Anxiety   . Arthritis   . Asthma   . Bronchitis    hx of   . Chronic headaches    [facility]  . Cystitis    1954  . Depression   . Ear pain    bilat   . Hiatal hernia   . History of blood transfusion    2010  . Hx of blood clots 2007 & 2010   post TKR  . Hyperthyroidism   . Pelvic fracture ([facility])    hx of in three places secondary to fall   . Pneumonia    hx of 1952  . Pulmonary embolism ([place]) 1993   on HRT  . Right wrist fracture    hx of 1995 has plate secondary to fall   . Shaking    hands bilat  . Shortness of breath dyspnea   . Skin cancer    basal cell   . Spinal stenosis   . Trauma    left lower leg 10/22/2014   . Urinary frequency    Past Surgical History:  Procedure Laterality Date  . ABDOMINAL HYSTERECTOMY    . BREAST ENHANCEMENT SURGERY  1985  . CHOLECYSTECTOMY    . COLONOSCOPY     diverticulosis  . ESI   11/2013    C7-T1 ; Dr Nelva Bush  . EYE SURGERY     bilat cataract surgery   . INSERTION OF MESH N/A 07/08/2015   Procedure: INSERTION OF MESH;  Surgeon: Armandina Gemma, MD;  Location: [facility];  Service: General;  Laterality: N/A;  . REPLACEMENT TOTAL KNEE BILATERAL  2007 & 2010  . SP FACET INJECTION  01/19/14   RC2-3,3-4,C4-5  . TONSILLECTOMY    . VENTRAL HERNIA REPAIR N/A 07/08/2015   Procedure: VENTRAL ADULT REPAIR VENTRAL INCISIONAL HERNIA REPAIR;  Surgeon: Armandina Gemma, MD;  Location: [facility];  Service: General;  Laterality: N/A;   Family History  Problem Relation Age of Onset  . Allergies Brother   .  Asthma Brother   . Heart disease Brother   . Clotting disorder Father        PTE X 2  . Skin cancer Father   . Skin cancer Mother   . Sudden death Unknown        2 M aunts & 2 M uncles in 25s  . Heart attack Brother        > 55  . Bladder Cancer Brother   . Diabetes Daughter   . Breast cancer Daughter   . Diabetes Son   . Mental illness Son        suicide  . Stroke Neg Hx    Social History   Socioeconomic History  . Marital status: Married    Spouse name: Not on file  . Number of children: 3  . Years of education: Not on file  . Highest education level: Not on file  Occupational History  . Occupation: retired  Scientific laboratory technician  . Financial resource strain: Not hard at all  . Food insecurity:    Worry: Never true    Inability: Never true  . Transportation needs:    Medical: No    Non-medical: No  Tobacco Use  . Smoking status: Never Smoker  . Smokeless tobacco: Never Used    Substance and Sexual Activity  . Alcohol use: Yes    Comment: minimal  . Drug use: No  . Sexual activity: Not Currently  Lifestyle  . Physical activity:    Days per week: 3 days    Minutes per session: 30 min  . Stress: Only a little  Relationships  . Social connections:    Talks on phone: More than three times a week    Gets together: More than three times a week    Attends religious service: More than 4 times per year    Active member of club or organization: Yes    Attends meetings of clubs or organizations: More than 4 times per year    Relationship status: Married  Other Topics Concern  . Not on file  Social History Narrative  . Not on file    Outpatient Encounter Medications as of 07/18/2018  Medication Sig  . albuterol (PROVENTIL HFA;VENTOLIN HFA) 108 (90 BASE) MCG/ACT inhaler Inhale 2 puffs into the lungs every 6 (six) hours as needed for wheezing or shortness of breath.  Marland Kitchen atorvastatin (LIPITOR) 20 MG tablet Take 20 mg by mouth daily.  . cholecalciferol (VITAMIN D) 1000 UNITS tablet Take 1,000 Units by mouth daily.  . Cyanocobalamin 1000 MCG/15ML LIQD Take 2,000 mcg by mouth every morning.   . DULoxetine (CYMBALTA) 30 MG capsule TK ONE C PO QD  . [DISCONTINUED] Aspirin-Acetaminophen-Caffeine (EXCEDRIN PO) Take 1 tablet by mouth every 6 (six) hours as needed (for migraines).   . [DISCONTINUED] Fluticasone-Salmeterol (ADVAIR) 100-50 MCG/DOSE AEPB Inhale 1 puff into the lungs 2 (two) times daily.   No facility-administered encounter medications on file as of 07/18/2018.     Activities of Daily Living In your present state of health, do you have any difficulty performing the following activities: 07/18/2018  Hearing? N  Vision? N  Difficulty concentrating or making decisions? N  Walking or climbing stairs? N  Dressing or bathing? N  Doing errands, shopping? N  Preparing Food and eating ? N  Using the Toilet? N  In the past six months, have you accidently leaked  urine? Y  Comment wears pads  Do you have problems with loss of bowel control?  N  Managing your Medications? N  Managing your Finances? N  Housekeeping or managing your Housekeeping? N  Some recent data might be hidden    Patient Care Team: Binnie Rail, MD as PCP - General (Internal Medicine) Tanda Rockers, MD as Consulting Physician (Pulmonary Disease) Marica Otter, [place] (Optometry) Norma Fredrickson, MD as Consulting Physician (Psychiatry) Philemon Kingdom, MD as Consulting Physician (Internal Medicine) Leatha Gilding, MD as Referring Physician (Orthopedic Surgery)    Assessment:   This is a routine wellness examination for Kierah. Physical assessment deferred to PCP.   Exercise Activities and Dietary recommendations Current Exercise Habits: Home exercise routine, Type of exercise: walking, Time (Minutes): 30, Frequency (Times/Week): 3, Weekly Exercise (Minutes/Week): 90, Intensity: Mild, Exercise limited by: None identified  Diet (meal preparation, eat out, water intake, caffeinated beverages, dairy products, fruits and vegetables): in general, a "healthy" diet  , well balanced, eats a variety of fruits and vegetables daily, limits salt, fat/cholesterol, sugar,carbohydrates,caffeine, drinks 6-8 glasses of water daily.     Goals    . Patient Stated     Stay as healthy and as independent as possible. Continue to eat healthy and be active physically and socially.       Fall Risk Fall Risk  07/18/2018 05/07/2017 05/07/2017 03/15/2016 01/07/2014  Falls in the past year? No Yes No Yes No  Number falls in past yr: - 1 - 1 -  Injury with Fall? - Yes - Yes -  Risk Factor Category  - High Fall Risk - - -  Risk for fall due to : Impaired mobility;Impaired balance/gait;History of fall(s) Impaired balance/gait - - -  Risk for fall due to: Comment - recently finish PT to help improve ambulatory gait - - -  Follow up - Education provided;Falls prevention discussed - - -    Depression  Screen PHQ 2/9 Scores 07/18/2018 05/07/2017 05/07/2017 03/15/2016  PHQ - 2 Score 1 1 0 1  PHQ- 9 Score 7 4 0 -     Cognitive Function MMSE - Mini Mental State Exam 07/18/2018 05/07/2017  Orientation to time 5 3  Orientation to Place 5 5  Registration 3 3  Attention/ Calculation 5 5  Recall 0 3  Language- name 2 objects 2 2  Language- repeat 1 1  Language- follow 3 step command 3 3  Language- read & follow direction 1 1  Write a sentence 1 1  Copy design 1 1  Total score 27 28        Immunization History  Administered Date(s) Administered  . Influenza Whole 10/28/2008, 09/28/2009, 10/08/2011  . Influenza, Seasonal, Injecte, Preservative Fre 11/03/2013  . Influenza,inj,Quad PF,6+ Mos 10/05/2014, 09/30/2015  . Influenza-Unspecified 09/25/2016, 09/09/2017  . Pneumococcal Conjugate-13 09/30/2015  . Pneumococcal Polysaccharide-23 01/07/2014  . Tdap 10/10/2014   Screening Tests Health Maintenance  Topic Date Due  . INFLUENZA VACCINE  07/31/2018  . TETANUS/TDAP  10/10/2024  . DEXA SCAN  Completed  . PNA vac Low Risk Adult  Completed      Plan:    Continue doing brain stimulating activities (puzzles, reading, adult coloring books, staying active) to keep memory sharp.   Continue to eat heart healthy diet (full of fruits, vegetables, whole grains, lean protein, water--limit salt, fat, and sugar intake) and increase physical activity as tolerated.   I have personally reviewed and noted the following in the patient's chart:   . Medical and social history . Use of alcohol, tobacco or illicit drugs  . Current medications  and supplements . Functional ability and status . Nutritional status . Physical activity . Advanced directives . List of other physicians . Vitals . Screenings to include cognitive, depression, and falls . Referrals and appointments  In addition, I have reviewed and discussed with patient certain preventive protocols, quality metrics, and best practice  recommendations. A written personalized care plan for preventive services as well as general preventive health recommendations were provided to patient.     Michiel Cowboy, RN  07/18/2018

## 2018-07-18 ENCOUNTER — Ambulatory Visit (INDEPENDENT_AMBULATORY_CARE_PROVIDER_SITE_OTHER): Payer: Medicare Other | Admitting: *Deleted

## 2018-07-18 VITALS — BP 112/68 | HR 73 | Resp 18 | Ht 67.0 in | Wt 162.0 lb

## 2018-07-18 DIAGNOSIS — Z0000 Encounter for general adult medical examination without abnormal findings: Secondary | ICD-10-CM | POA: Diagnosis not present

## 2018-07-18 NOTE — Patient Instructions (Addendum)
Continue doing brain stimulating activities (puzzles, reading, adult coloring books, staying active) to keep memory sharp.   Continue to eat heart healthy diet (full of fruits, vegetables, whole grains, lean protein, water--limit salt, fat, and sugar intake) and increase physical activity as tolerated.   [name] , Thank you for taking time to come for your Medicare Wellness Visit. I appreciate your ongoing commitment to your health goals. Please review the following plan we discussed and let me know if I can assist you in the future.   These are the goals we discussed: Goals    . Patient Stated     Stay as healthy and as independent as possible. Continue to eat healthy and be active physically and socially.       This is a list of the screening recommended for you and due dates:  Health Maintenance  Topic Date Due  . Flu Shot  07/31/2018  . Tetanus Vaccine  10/10/2024  . DEXA scan (bone density measurement)  Completed  . Pneumonia vaccines  Completed   Health Maintenance, Female Adopting a healthy lifestyle and getting preventive care can go a long way to promote health and wellness. Talk with your health care provider about what schedule of regular examinations is right for you. This is a good chance for you to check in with your provider about disease prevention and staying healthy. In between checkups, there are plenty of things you can do on your own. Experts have done a lot of research about which lifestyle changes and preventive measures are most likely to keep you healthy. Ask your health care provider for more information. Weight and diet Eat a healthy diet  Be sure to include plenty of vegetables, fruits, low-fat dairy products, and lean protein.  Do not eat a lot of foods high in solid fats, added sugars, or salt.  Get regular exercise. This is one of the most important things you can do for your health. ? Most adults should exercise for at least 150 minutes each week.  The exercise should increase your heart rate and make you sweat (moderate-intensity exercise). ? Most adults should also do strengthening exercises at least twice a week. This is in addition to the moderate-intensity exercise.  Maintain a healthy weight  Body mass index (BMI) is a measurement that can be used to identify possible weight problems. It estimates body fat based on height and weight. Your health care provider can help determine your BMI and help you achieve or maintain a healthy weight.  For females 34 years of age and older: ? A BMI below 18.5 is considered underweight. ? A BMI of 18.5 to 24.9 is normal. ? A BMI of 25 to 29.9 is considered overweight. ? A BMI of 30 and above is considered obese.  Watch levels of cholesterol and blood lipids  You should start having your blood tested for lipids and cholesterol at 82 years of age, then have this test every 5 years.  You may need to have your cholesterol levels checked more often if: ? Your lipid or cholesterol levels are high. ? You are older than 82 years of age. ? You are at high risk for heart disease.  Cancer screening Lung Cancer  Lung cancer screening is recommended for adults 34-74 years old who are at high risk for lung cancer because of a history of smoking.  A yearly low-dose CT scan of the lungs is recommended for people who: ? Currently smoke. ? Have quit within  the past 15 years. ? Have at least a 30-pack-year history of smoking. A pack year is smoking an average of one pack of cigarettes a day for 1 year.  Yearly screening should continue until it has been 15 years since you quit.  Yearly screening should stop if you develop a health problem that would prevent you from having lung cancer treatment.  Breast Cancer  Practice breast self-awareness. This means understanding how your breasts normally appear and feel.  It also means doing regular breast self-exams. Let your health care provider know about  any changes, no matter how small.  If you are in your 20s or 30s, you should have a clinical breast exam (CBE) by a health care provider every 1-3 years as part of a regular health exam.  If you are 62 or older, have a CBE every year. Also consider having a breast X-ray (mammogram) every year.  If you have a family history of breast cancer, talk to your health care provider about genetic screening.  If you are at high risk for breast cancer, talk to your health care provider about having an MRI and a mammogram every year.  Breast cancer gene (BRCA) assessment is recommended for women who have family members with BRCA-related cancers. BRCA-related cancers include: ? Breast. ? Ovarian. ? Tubal. ? Peritoneal cancers.  Results of the assessment will determine the need for genetic counseling and BRCA1 and BRCA2 testing.  Cervical Cancer Your health care provider may recommend that you be screened regularly for cancer of the pelvic organs (ovaries, uterus, and vagina). This screening involves a pelvic examination, including checking for microscopic changes to the surface of your cervix (Pap test). You may be encouraged to have this screening done every 3 years, beginning at age 7.  For women ages 56-65, health care providers may recommend pelvic exams and Pap testing every 3 years, or they may recommend the Pap and pelvic exam, combined with testing for human papilloma virus (HPV), every 5 years. Some types of HPV increase your risk of cervical cancer. Testing for HPV may also be done on women of any age with unclear Pap test results.  Other health care providers may not recommend any screening for nonpregnant women who are considered low risk for pelvic cancer and who do not have symptoms. Ask your health care provider if a screening pelvic exam is right for you.  If you have had past treatment for cervical cancer or a condition that could lead to cancer, you need Pap tests and screening for  cancer for at least 20 years after your treatment. If Pap tests have been discontinued, your risk factors (such as having a new sexual partner) need to be reassessed to determine if screening should resume. Some women have medical problems that increase the chance of getting cervical cancer. In these cases, your health care provider may recommend more frequent screening and Pap tests.  Colorectal Cancer  This type of cancer can be detected and often prevented.  Routine colorectal cancer screening usually begins at 82 years of age and continues through 82 years of age.  Your health care provider may recommend screening at an earlier age if you have risk factors for colon cancer.  Your health care provider may also recommend using home test kits to check for hidden blood in the stool.  A small camera at the end of a tube can be used to examine your colon directly (sigmoidoscopy or colonoscopy). This is done to check for  the earliest forms of colorectal cancer.  Routine screening usually begins at age 32.  Direct examination of the colon should be repeated every 5-10 years through 82 years of age. However, you may need to be screened more often if early forms of precancerous polyps or small growths are found.  Skin Cancer  Check your skin from head to toe regularly.  Tell your health care provider about any new moles or changes in moles, especially if there is a change in a mole's shape or color.  Also tell your health care provider if you have a mole that is larger than the size of a pencil eraser.  Always use sunscreen. Apply sunscreen liberally and repeatedly throughout the day.  Protect yourself by wearing long sleeves, pants, a wide-brimmed hat, and sunglasses whenever you are outside.  Heart disease, diabetes, and high blood pressure  High blood pressure causes heart disease and increases the risk of stroke. High blood pressure is more likely to develop in: ? People who have blood  pressure in the high end of the normal range (130-139/85-89 mm Hg). ? People who are overweight or obese. ? People who are African American.  If you are 17-32 years of age, have your blood pressure checked every 3-5 years. If you are 13 years of age or older, have your blood pressure checked every year. You should have your blood pressure measured twice-once when you are at a hospital or clinic, and once when you are not at a hospital or clinic. Record the average of the two measurements. To check your blood pressure when you are not at a hospital or clinic, you can use: ? An automated blood pressure machine at a pharmacy. ? A home blood pressure monitor.  If you are between 81 years and 29 years old, ask your health care provider if you should take aspirin to prevent strokes.  Have regular diabetes screenings. This involves taking a blood sample to check your fasting blood sugar level. ? If you are at a normal weight and have a low risk for diabetes, have this test once every three years after 82 years of age. ? If you are overweight and have a high risk for diabetes, consider being tested at a younger age or more often. Preventing infection Hepatitis B  If you have a higher risk for hepatitis B, you should be screened for this virus. You are considered at high risk for hepatitis B if: ? You were born in a country where hepatitis B is common. Ask your health care provider which countries are considered high risk. ? Your parents were born in a high-risk country, and you have not been immunized against hepatitis B (hepatitis B vaccine). ? You have HIV or AIDS. ? You use needles to inject street drugs. ? You live with someone who has hepatitis B. ? You have had sex with someone who has hepatitis B. ? You get hemodialysis treatment. ? You take certain medicines for conditions, including cancer, organ transplantation, and autoimmune conditions.  Hepatitis C  Blood testing is recommended  for: ? Everyone born from [birth year] through 1963. ? Anyone with known risk factors for hepatitis C.  Sexually transmitted infections (STIs)  You should be screened for sexually transmitted infections (STIs) including gonorrhea and chlamydia if: ? You are sexually active and are younger than 82 years of age. ? You are older than 82 years of age and your health care provider tells you that you are at risk for this  type of infection. ? Your sexual activity has changed since you were last screened and you are at an increased risk for chlamydia or gonorrhea. Ask your health care provider if you are at risk.  If you do not have HIV, but are at risk, it may be recommended that you take a prescription medicine daily to prevent HIV infection. This is called pre-exposure prophylaxis (PrEP). You are considered at risk if: ? You are sexually active and do not regularly use condoms or know the HIV status of your partner(s). ? You take drugs by injection. ? You are sexually active with a partner who has HIV.  Talk with your health care provider about whether you are at high risk of being infected with HIV. If you choose to begin PrEP, you should first be tested for HIV. You should then be tested every 3 months for as long as you are taking PrEP. Pregnancy  If you are premenopausal and you may become pregnant, ask your health care provider about preconception counseling.  If you may become pregnant, take 400 to 800 micrograms (mcg) of folic acid every day.  If you want to prevent pregnancy, talk to your health care provider about birth control (contraception). Osteoporosis and menopause  Osteoporosis is a disease in which the bones lose minerals and strength with aging. This can result in serious bone fractures. Your risk for osteoporosis can be identified using a bone density scan.  If you are 48 years of age or older, or if you are at risk for osteoporosis and fractures, ask your health care provider if  you should be screened.  Ask your health care provider whether you should take a calcium or vitamin D supplement to lower your risk for osteoporosis.  Menopause may have certain physical symptoms and risks.  Hormone replacement therapy may reduce some of these symptoms and risks. Talk to your health care provider about whether hormone replacement therapy is right for you. Follow these instructions at home:  Schedule regular health, dental, and eye exams.  Stay current with your immunizations.  Do not use any tobacco products including cigarettes, chewing tobacco, or electronic cigarettes.  If you are pregnant, do not drink alcohol.  If you are breastfeeding, limit how much and how often you drink alcohol.  Limit alcohol intake to no more than 1 drink per day for nonpregnant women. One drink equals 12 ounces of beer, 5 ounces of Ellon Marasco, or 1 ounces of hard liquor.  Do not use street drugs.  Do not share needles.  Ask your health care provider for help if you need support or information about quitting drugs.  Tell your health care provider if you often feel depressed.  Tell your health care provider if you have ever been abused or do not feel safe at home. This information is not intended to replace advice given to you by your health care provider. Make sure you discuss any questions you have with your health care provider. Document Released: 07/02/2011 Document Revised: 05/24/2016 Document Reviewed: 09/20/2015 Elsevier Interactive Patient Education  [employer]. It is important to avoid accidents which may result in broken bones.  Here are a few ideas on how to make your home safer so you will be less likely to trip or fall.  1. Use nonskid mats or non slip strips in your shower or tub, on your bathroom floor and around sinks.  If you know that you have spilled water, wipe it up! 2. In the bathroom,  it is important to have properly installed grab bars on the walls or on the  edge of the tub.  Towel racks are NOT strong enough for you to hold onto or to pull on for support. 3. Stairs and hallways should have enough light.  Add lamps or night lights if you need ore light. 4. It is good to have handrails on both sides of the stairs if possible.  Always fix broken handrails right away. 5. It is important to see the edges of steps.  Paint the edges of outdoor steps white so you can see them better.  Put colored tape on the edge of inside steps. 6. Throw-rugs are dangerous because they can slide.  Removing the rugs is the best idea, but if they must stay, add adhesive carpet tape to prevent slipping. 7. Do not keep things on stairs or in the halls.  Remove small furniture that blocks the halls as it may cause you to trip.  Keep telephone and electrical cords out of the way where you walk. 8. Always were sturdy, rubber-soled shoes for good support.  Never wear just socks, especially on the stairs.  Socks may cause you to slip or fall.  Do not wear full-length housecoats as you can easily trip on the bottom.  9. Place the things you use the most on the shelves that are the easiest to reach.  If you use a stepstool, make sure it is in good condition.  If you feel unsteady, DO NOT climb, ask for help. 10. If a health professional advises you to use a cane or walker, do not be ashamed.  These items can keep you from falling and breaking your bones.

## 2018-07-18 NOTE — Progress Notes (Signed)
Medical screening examination/treatment/procedure(s) were performed by non-physician practitioner and as supervising physician I was immediately available for consultation/collaboration. I agree with above. Audi Conover A Mearle Drew, MD 

## 2018-07-21 DIAGNOSIS — F411 Generalized anxiety disorder: Secondary | ICD-10-CM | POA: Diagnosis not present

## 2018-07-28 ENCOUNTER — Encounter: Payer: Self-pay | Admitting: Internal Medicine

## 2018-07-28 ENCOUNTER — Ambulatory Visit (INDEPENDENT_AMBULATORY_CARE_PROVIDER_SITE_OTHER)
Admission: RE | Admit: 2018-07-28 | Discharge: 2018-07-28 | Disposition: A | Discharge status: Home / Self Care | Payer: Medicare Other | Source: Ambulatory Visit | Attending: Internal Medicine | Admitting: Internal Medicine

## 2018-07-28 ENCOUNTER — Telehealth: Payer: Self-pay | Admitting: Gastroenterology

## 2018-07-28 ENCOUNTER — Ambulatory Visit: Payer: Self-pay

## 2018-07-28 ENCOUNTER — Ambulatory Visit (INDEPENDENT_AMBULATORY_CARE_PROVIDER_SITE_OTHER): Payer: Medicare Other | Admitting: Internal Medicine

## 2018-07-28 ENCOUNTER — Other Ambulatory Visit: Payer: Self-pay | Admitting: Internal Medicine

## 2018-07-28 ENCOUNTER — Other Ambulatory Visit (INDEPENDENT_AMBULATORY_CARE_PROVIDER_SITE_OTHER): Payer: Medicare Other

## 2018-07-28 VITALS — BP 112/68 | HR 95 | Temp 98.5°F | Resp 16 | Wt 164.0 lb

## 2018-07-28 DIAGNOSIS — R05 Cough: Secondary | ICD-10-CM

## 2018-07-28 DIAGNOSIS — R195 Other fecal abnormalities: Secondary | ICD-10-CM

## 2018-07-28 DIAGNOSIS — R059 Cough, unspecified: Secondary | ICD-10-CM

## 2018-07-28 DIAGNOSIS — R0789 Other chest pain: Secondary | ICD-10-CM

## 2018-07-28 DIAGNOSIS — R079 Chest pain, unspecified: Secondary | ICD-10-CM | POA: Diagnosis not present

## 2018-07-28 LAB — COMPREHENSIVE METABOLIC PANEL
ALT: 12 U/L (ref 0–35)
AST: 16 U/L (ref 0–37)
Albumin: 3.9 g/dL (ref 3.5–5.2)
Alkaline Phosphatase: 115 U/L (ref 39–117)
BUN: 25 mg/dL — AB (ref 6–23)
CHLORIDE: 104 meq/L (ref 96–112)
CO2: 27 meq/L (ref 19–32)
CREATININE: 0.99 mg/dL (ref 0.40–1.20)
Calcium: 9.2 mg/dL (ref 8.4–10.5)
GFR: 56.73 mL/min — ABNORMAL LOW (ref >60.00)
GLUCOSE: 95 mg/dL (ref 70–99)
Potassium: 4.4 mEq/L (ref 3.5–5.1)
SODIUM: 138 meq/L (ref 135–145)
Total Bilirubin: 0.8 mg/dL (ref 0.2–1.2)
Total Protein: 6.9 g/dL (ref 6.0–8.3)

## 2018-07-28 LAB — CBC WITH DIFFERENTIAL/PLATELET
BASOS PCT: 0.4 % (ref 0.0–3.0)
Basophils Absolute: 0 10*3/uL (ref 0.0–0.1)
EOS PCT: 2.3 % (ref 0.0–5.0)
Eosinophils Absolute: 0.2 10*3/uL (ref 0.0–0.7)
HCT: 37.6 % (ref 36.0–46.0)
HEMOGLOBIN: 12.5 g/dL (ref 12.0–15.0)
LYMPHS ABS: 2 10*3/uL (ref 0.7–4.0)
Lymphocytes Relative: 26.4 % (ref 12.0–46.0)
MCHC: 33.1 g/dL (ref 30.0–36.0)
MCV: 94.2 fl (ref 78.0–100.0)
MONO ABS: 1 10*3/uL (ref 0.1–1.0)
Monocytes Relative: 12.9 % — ABNORMAL HIGH (ref 3.0–12.0)
Neutro Abs: 4.5 10*3/uL (ref 1.4–7.7)
Neutrophils Relative %: 58 % (ref 43.0–77.0)
Platelets: 303 10*3/uL (ref 150.0–400.0)
RBC: 4 Mil/uL (ref 3.87–5.11)
RDW: 12.9 % (ref 11.5–15.5)
WBC: 7.7 10*3/uL (ref 4.0–10.5)

## 2018-07-28 LAB — D-DIMER, QUANTITATIVE (NOT AT ARMC): D DIMER QUANT: 3.67 ug{FEU}/mL — AB (ref <0.50)

## 2018-07-28 NOTE — Telephone Encounter (Signed)
Called office to discuss pt. Appointment and pt. Has already arrived for appointment per Tammy.

## 2018-07-28 NOTE — Assessment & Plan Note (Signed)
Started 3 days ago Has chronic constipation that is controlled, no other concerning symptoms including chest pain, nausea and fever CBC, CMP Fecal occult

## 2018-07-28 NOTE — Patient Instructions (Signed)
Have blood work and a chest xray today.  Test(s) ordered today. Your results will be released to [place] (or called to you) after review, usually within 72hours after test completion. If any changes need to be made, you will be notified at that same time.  An EKG was done today.    Medications reviewed and updated.  Changes include taking something for your cough.    Call if no improvement

## 2018-07-28 NOTE — Telephone Encounter (Signed)
The pt last saw Tye Savoy on 03/19/18

## 2018-07-28 NOTE — Telephone Encounter (Signed)
Spoke with pt and let her know there are no available appts and Dr. Fuller Plan is off. Pt instructed to contact her PCP office to be seen sooner and get stool checked. Pt verbalized understanding.

## 2018-07-28 NOTE — Assessment & Plan Note (Signed)
She has a chronic cough-possibly slightly increased She feels the bilateral lower rib pain is from coughing, but is also concerned about the possibility of a blood clot because of her history of blood clots and long car ride Oxygen saturation normal, no tachycardia EKG today Chest x-ray D-dimer-if positive will need CT angios of chest

## 2018-07-28 NOTE — Telephone Encounter (Signed)
Pt. Reports she has been having black stools x 4 days. States she has been coughing as well and is hurting on both sides of her chest/ribs.Hurting on and off x 1 week. Reports she has a hiatal hernia as well. No chest pain right now. 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 "This has not felt like that." Agent gave pt. Appointment for 2:30 today. If symptoms worsen , will go to ED. Verbalizes understanding. Reason for Disposition . [1] Chest pain lasting <= 5 minutes AND [2] NO chest pain or cardiac symptoms now(Exceptions: pains lasting a few seconds)  Answer Assessment - Initial Assessment Questions 1. LOCATION: "Where does it hurt?"       Hurts on both sides - the left side is worse 2. RADIATION: "Does the pain go anywhere else?" (e.g., into neck, jaw, arms, back)     No 3. ONSET: "When did the chest pain begin?" (Minutes, hours or days)      1 week ago 4. PATTERN "Does the pain come and go, or has it been constant since it started?"  "Does it get worse with exertion?"      Comes and goes 5. DURATION: "How long does it last" (e.g., seconds, minutes, hours)     Can lasts hours - can be from coughing 6. SEVERITY: "How bad is the pain?"  (e.g., Scale 1-10; mild, moderate, or severe)    - MILD (1-3): doesn't interfere with normal activities     - MODERATE (4-7): interferes with normal activities or awakens from sleep    - SEVERE (8-10): excruciating pain, unable to do any normal activities       5 7. CARDIAC RISK FACTORS: "Do you have any history of heart problems or risk factors for heart disease?" (e.g., prior heart attack, angina; high blood pressure, diabetes, being overweight, high cholesterol, smoking, or strong family history of heart disease)     No 8. PULMONARY RISK FACTORS: "Do you have any history of lung disease?"  (e.g., blood clots in lung, asthma, emphysema, birth control pills)     Yes - blood clots in 1990 9. CAUSE: "What  do you think is causing the chest pain?"     Cough 10. OTHER SYMPTOMS: "Do you have any other symptoms?" (e.g., dizziness, nausea, vomiting, sweating, fever, difficulty breathing, cough)       No 11. PREGNANCY: "Is there any chance you are pregnant?" "When was your last menstrual period?"       N/A  Protocols used: CHEST PAIN-A-AH

## 2018-07-28 NOTE — Assessment & Plan Note (Addendum)
Bilateral lower rib pain-she notices it most when moving around at night, but no increased pain with deep breaths Slightly tender to touch on left side ?  Muscular skeletal Given her history of DVT/PE and recent long car ride need to rule out PE Chest x-ray, EKG, d-dimer EKG - sinus at 89 bpm, low voltage in limb leads, only change compared to prior EKG is low voltage May need CT chest angio

## 2018-07-28 NOTE — Progress Notes (Signed)
Subjective:    Patient ID: Faith Edwards, female    DOB: [birth year], 84 y.o.   MRN: [id number]  HPI The patient is here for an acute visit.  Two weeks she went to [place] and was in the car for 10 hours.  She is concerned about a possible blood clot because she has had them in the past  - several years ago - the 1990's.  She has a chronic cough with clear sputum, chronic SOB that is unchanged and denies wheeze.  She has lower b/l chest pain that hurts more with movement, but not with deep breaths.  She states it feels like a muscle strain from coughing.     She has dark green-black stools.  She noticed the darker color three days.  She denies changes in diet.  She denies otc medications.  Her stools are regular and formed.  She denies abdominal pain.  She has some mild chronic constipation of which is controlled with smooth move.     Medications and allergies reviewed with patient and updated if appropriate.  Patient Active Problem List   Diagnosis Date Noted  . Dysuria 05/08/2018  . Prediabetes 05/09/2017  . CKD (chronic kidney disease) stage 3, GFR 30-59 ml/min (HCC) 11/08/2016  . Bursitis of right knee 08/07/2016  . Herpes zoster 06/02/2016  . Upper airway cough syndrome 06/01/2016  . Hyperlipidemia 03/22/2016  . Choking 03/15/2016  . GERD (gastroesophageal reflux disease) 03/15/2016  . Graves disease 01/04/2016  . Osteopenia, severe 10/14/2015  . Other constipation 09/01/2015  . Incisional hernia 07/08/2015  . Ventral hernia without obstruction or gangrene 05/12/2015  . Lower back pain 05/12/2015  . Spinal stenosis in cervical region 11/26/2013  . Depression 08/05/2008  . Personal history of venous thrombosis and embolism 08/05/2008  . Chronic rhinitis 12/29/2007  . Asthma 12/29/2007    Current Outpatient Medications on File Prior to Visit  Medication Sig Dispense Refill  . albuterol (PROVENTIL HFA;VENTOLIN HFA) 108 (90 BASE) MCG/ACT inhaler Inhale 2 puffs into the  lungs every 6 (six) hours as needed for wheezing or shortness of breath. 1 Inhaler 0  . atorvastatin (LIPITOR) 20 MG tablet Take 20 mg by mouth daily.    . cholecalciferol (VITAMIN D) 1000 UNITS tablet Take 1,000 Units by mouth daily.    . Cyanocobalamin 1000 MCG/15ML LIQD Take 2,000 mcg by mouth every morning.     . DULoxetine (CYMBALTA) 30 MG capsule TK ONE C PO QD  4  . [DISCONTINUED] Fluticasone-Salmeterol (ADVAIR) 100-50 MCG/DOSE AEPB Inhale 1 puff into the lungs 2 (two) times daily.     No current facility-administered medications on file prior to visit.     Past Medical History:  Diagnosis Date  . Anxiety   . Arthritis   . Asthma   . Bronchitis    hx of   . [facility] headaches    [facility]  . Cystitis    1954  . Depression   . Ear pain    bilat   . Hiatal hernia   . History of blood transfusion    2010  . Hx of blood clots 2007 & 2010   post TKR  . Hyperthyroidism   . Pelvic fracture (HCC)    hx of in three places secondary to fall   . Pneumonia    hx of 1952  . Pulmonary embolism ([place]) 1993   on HRT  . Right wrist fracture    hx of 1995 has plate secondary  to fall   . Shaking    hands bilat  . Shortness of breath dyspnea   . Skin cancer    basal cell   . Spinal stenosis   . Trauma    left lower leg 10/22/2014   . Urinary frequency     Past Surgical History:  Procedure Laterality Date  . ABDOMINAL HYSTERECTOMY    . BREAST ENHANCEMENT SURGERY  1985  . CHOLECYSTECTOMY    . COLONOSCOPY     diverticulosis  . ESI   11/2013    C7-T1 ; Dr Nelva Bush  . EYE SURGERY     bilat cataract surgery   . INSERTION OF MESH N/A 07/08/2015   Procedure: INSERTION OF MESH;  Surgeon: Armandina Gemma, MD;  Location: [facility];  Service: General;  Laterality: N/A;  . REPLACEMENT TOTAL KNEE BILATERAL  2007 & 2010  . SP FACET INJECTION  01/19/14   RC2-3,3-4,C4-5  . TONSILLECTOMY    . VENTRAL HERNIA REPAIR N/A 07/08/2015   Procedure: VENTRAL ADULT REPAIR VENTRAL INCISIONAL HERNIA  REPAIR;  Surgeon: Armandina Gemma, MD;  Location: [facility];  Service: General;  Laterality: N/A;    Social History   Socioeconomic History  . Marital status: Married    Spouse name: Not on file  . Number of children: 3  . Years of education: Not on file  . Highest education level: Not on file  Occupational History  . Occupation: retired  Scientific laboratory technician  . Financial resource strain: Not hard at all  . Food insecurity:    Worry: Never true    Inability: Never true  . Transportation needs:    Medical: No    Non-medical: No  Tobacco Use  . Smoking status: Never Smoker  . Smokeless tobacco: Never Used  Substance and Sexual Activity  . Alcohol use: Yes    Comment: minimal  . Drug use: No  . Sexual activity: Not Currently  Lifestyle  . Physical activity:    Days per week: 3 days    Minutes per session: 30 min  . Stress: Only a little  Relationships  . Social connections:    Talks on phone: More than three times a week    Gets together: More than three times a week    Attends religious service: More than 4 times per year    Active member of club or organization: Yes    Attends meetings of clubs or organizations: More than 4 times per year    Relationship status: Married  Other Topics Concern  . Not on file  Social History Narrative  . Not on file    Family History  Problem Relation Age of Onset  . Allergies Brother   . Asthma Brother   . Heart disease Brother   . Clotting disorder Father        PTE X 2  . Skin cancer Father   . Skin cancer Mother   . Sudden death Unknown        2 M aunts & 2 M uncles in 39s  . Heart attack Brother        > 55  . Bladder Cancer Brother   . Diabetes Daughter   . Breast cancer Daughter   . Diabetes Son   . Mental illness Son        suicide  . Stroke Neg Hx     Review of Systems  Constitutional: Negative for chills, fatigue and fever.  Respiratory: Positive for cough (chronic, clear sputum)  and shortness of breath (little - no  change, chronic). Negative for wheezing.   Cardiovascular: Positive for chest pain (b/l lower ribs - feels like muscle strain from coughing, but h/o PE). Negative for palpitations and leg swelling.  Gastrointestinal: Positive for constipation (controlled with smooth move). Negative for abdominal pain, blood in stool, diarrhea and nausea.       Had GERD - symptomatic < 1/week  Neurological: Negative for light-headedness and headaches.       Objective:   Vitals:   07/28/18 1336  BP: 112/68  Pulse: 95  Resp: 16  Temp: 98.5 F (36.9 C)  SpO2: 95%   BP Readings from Last 3 Encounters:  07/28/18 112/68  07/18/18 112/68  07/07/18 100/60   Wt Readings from Last 3 Encounters:  07/28/18 164 lb (74.4 kg)  07/18/18 162 lb (73.5 kg)  07/07/18 163 lb 6.4 oz (74.1 kg)   Body mass index is 25.69 kg/m.   Physical Exam    Constitutional: Appears well-developed and well-nourished. No distress.  HENT:  Head: Normocephalic and atraumatic.  Neck: Neck supple. No tracheal deviation present. No thyromegaly present.  No cervical lymphadenopathy Cardiovascular: Normal rate, regular rhythm and normal heart sounds.   No murmur heard.   No edema Pulmonary/Chest: Effort normal and breath sounds normal. No respiratory distress. No has no wheezes. No rales. minimal tenderness with palpation left lateral ribs Abdomen: soft, NT, nd Skin: Skin is warm and dry. Not diaphoretic.  Psychiatric: Normal mood and affect. Behavior is normal.     Assessment & Plan:    See Problem List for Assessment and Plan of chronic medical problems.

## 2018-07-29 ENCOUNTER — Ambulatory Visit (INDEPENDENT_AMBULATORY_CARE_PROVIDER_SITE_OTHER)
Admission: RE | Admit: 2018-07-29 | Discharge: 2018-07-29 | Disposition: A | Discharge status: Home / Self Care | Payer: Medicare Other | Source: Ambulatory Visit | Attending: Internal Medicine | Admitting: Internal Medicine

## 2018-07-29 ENCOUNTER — Telehealth: Payer: Self-pay | Admitting: Internal Medicine

## 2018-07-29 ENCOUNTER — Encounter: Payer: Self-pay | Admitting: Internal Medicine

## 2018-07-29 DIAGNOSIS — R918 Other nonspecific abnormal finding of lung field: Secondary | ICD-10-CM | POA: Insufficient documentation

## 2018-07-29 DIAGNOSIS — R0789 Other chest pain: Secondary | ICD-10-CM

## 2018-07-29 DIAGNOSIS — R079 Chest pain, unspecified: Secondary | ICD-10-CM | POA: Diagnosis not present

## 2018-07-29 MED ORDER — IOPAMIDOL (ISOVUE-370) INJECTION 76%
100.0000 mL | Freq: Once | INTRAVENOUS | Status: AC | PRN
  Administered 2018-07-29: 80 mL via INTRAVENOUS

## 2018-07-29 NOTE — Telephone Encounter (Signed)
I spoke to pt and gave her her ct appt info. Talked a little about labs but she would still like to talk with you.  thanks

## 2018-07-29 NOTE — Telephone Encounter (Signed)
Spoke with pt to clarify directions for stool cards.

## 2018-07-30 ENCOUNTER — Telehealth: Payer: Self-pay | Admitting: Internal Medicine

## 2018-07-30 ENCOUNTER — Other Ambulatory Visit (INDEPENDENT_AMBULATORY_CARE_PROVIDER_SITE_OTHER): Payer: Medicare Other

## 2018-07-30 DIAGNOSIS — R195 Other fecal abnormalities: Secondary | ICD-10-CM

## 2018-07-30 LAB — FECAL OCCULT BLOOD, IMMUNOCHEMICAL: FECAL OCCULT BLD: NEGATIVE

## 2018-07-30 NOTE — Telephone Encounter (Signed)
Spoke with patient. She has agreed to follow up with MW. She has been scheduled for 08/08/18 at 2pm. She verbalized understanding about results and appt.   Nothing further needed at time of call.

## 2018-07-30 NOTE — Telephone Encounter (Signed)
Notes recorded by Cresenciano Lick, CMA on 07/30/2018 at 11:24 AM EDT Pt informed of below. Results routed to Dr. Melvyn Novas per patient's request. ------  Notes recorded by Binnie Rail, MD on 07/29/2018 at 8:31 PM EDT Ct of her chest shows no PE. There are some tiny nodules in the right lung. She never smoked so does not need follow up, but we could also do a ct scan in one year to re-evaluate.

## 2018-07-30 NOTE — Telephone Encounter (Signed)
Patient asking how quickly are the fecal occult tests processed by the lab. I advised though I was not certain how these were batched I felt sure her PCP would have her results in a week.

## 2018-07-30 NOTE — Telephone Encounter (Signed)
Patient states she went to pcp and took stool test, and now she is having to wait 4 days for results. Patient is very concerned about black liquid stools and would like advice on where to go next.

## 2018-07-30 NOTE — Telephone Encounter (Signed)
Copied from [place] [id number]. Topic: Inquiry >> Jul 30, 2018  8:54 AM Mylinda Latina, NT wrote: Reason for CRM: Patient called and is inquiring about her CT scan reports . Please call CB# [phone].

## 2018-07-30 NOTE — Telephone Encounter (Signed)
They are probably completely benign but since we have not seen her in two years would like to see her back in office to develop a game plan for appropriate follow up  - any time in next 4 weeks is fine

## 2018-07-30 NOTE — Telephone Encounter (Signed)
Called and spoke with pt regarding CT scan showed small nodules per Dr. Quay Burow MD Pt is requesting MW to look over the CT scan from 07/29/18 for additional review  MW please advise.

## 2018-07-30 NOTE — Telephone Encounter (Signed)
Beth this pt saw Nevin Bloodgood

## 2018-07-30 NOTE — Telephone Encounter (Signed)
Copied from [place] [id number]. Topic: Inquiry >> Jul 30, 2018  8:54 AM Faith Edwards, NT wrote: Reason for CRM: Patient called and is inquiring about her CT scan reports . Please call CB# [phone].

## 2018-07-31 ENCOUNTER — Telehealth: Payer: Self-pay | Admitting: Internal Medicine

## 2018-07-31 NOTE — Telephone Encounter (Signed)
See result note.  

## 2018-07-31 NOTE — Telephone Encounter (Signed)
Copied from [place] [id number]. Topic: Quick Communication - Lab Results >> Jul 31, 2018  4:12 PM Blondell Reveal, [place] wrote: Called patient to inform them of 07/30/2018 lab results. When patient returns call, triage nurse may disclose results.    Please call Cb# [phone]

## 2018-08-01 ENCOUNTER — Other Ambulatory Visit: Payer: Self-pay | Admitting: Internal Medicine

## 2018-08-01 DIAGNOSIS — R195 Other fecal abnormalities: Secondary | ICD-10-CM

## 2018-08-06 ENCOUNTER — Encounter: Payer: Self-pay | Admitting: Physician Assistant

## 2018-08-06 ENCOUNTER — Ambulatory Visit (INDEPENDENT_AMBULATORY_CARE_PROVIDER_SITE_OTHER): Payer: Medicare Other | Admitting: Physician Assistant

## 2018-08-06 ENCOUNTER — Encounter (INDEPENDENT_AMBULATORY_CARE_PROVIDER_SITE_OTHER): Payer: Self-pay

## 2018-08-06 VITALS — BP 96/56 | HR 89 | Ht 65.0 in | Wt 165.4 lb

## 2018-08-06 DIAGNOSIS — K5730 Diverticulosis of large intestine without perforation or abscess without bleeding: Secondary | ICD-10-CM | POA: Diagnosis not present

## 2018-08-06 DIAGNOSIS — R195 Other fecal abnormalities: Secondary | ICD-10-CM | POA: Diagnosis not present

## 2018-08-06 DIAGNOSIS — R1032 Left lower quadrant pain: Secondary | ICD-10-CM | POA: Diagnosis not present

## 2018-08-06 NOTE — Progress Notes (Signed)
 Subjective:    Patient ID: Faith Edwards, female    DOB: 08/14/1932, 84 y.o.   MRN: [id number]  HPI [name] is a very nice 84-year-old white female, known to Dr. Stark.  She has history of GERD, chronic kidney disease stage III, prior history of pulmonary emboli, Graves' disease and is status post cholecystectomy. Last colonoscopy September 2016 no polyps, she had mild sigmoid diverticulosis and grade 1 internal hemorrhoids. She had been seen in March 2019 by Paula Guenther at that time with complaints of dark stools.  Labs were checked and were within normal limits. Patient states she had onset of dark stools again about 3 weeks ago which concerned her for bleeding.  She had not had any changes in her diet or p.o. intake, no new meds supplements etc.  She was seen by primary care on 07/28/2018 and fecal occult blood was tested and returned negative for blood.  Hemoglobin 12.5 hematocrit of 37.6 MCV of 94, C met unremarkable. At that time she had also been complaining of some discomfort and shortness of breath she had CT angios of the chest which was negative for pulmonary embolus. She comes in today still concerned about the dark stool.  She says that she had dark stools all the way up to yesterday and has had brown stools since.  She has some mild alternating constipation and diarrhea, generally tends more towards constipation.  She is been using smooth move tea which works well.  She has not noticed any bright red blood.  She is not on any blood thinners, no regular NSAIDs and no Pepto-Bismol. She also states she is been having some mild discomfort on her left side over the past couple of weeks.   Review of Systems Pertinent positive and negative review of systems were noted in the above HPI section.  All other review of systems was otherwise negative.  Outpatient Encounter Medications as of 08/06/2018  Medication Sig  . albuterol (PROVENTIL HFA;VENTOLIN HFA) 108 (90 BASE) MCG/ACT inhaler Inhale 2  puffs into the lungs every 6 (six) hours as needed for wheezing or shortness of breath.  . atorvastatin (LIPITOR) 20 MG tablet Take 20 mg by mouth daily.  . cholecalciferol (VITAMIN D) 1000 UNITS tablet Take 1,000 Units by mouth daily.  . Cyanocobalamin 1000 MCG/15ML LIQD Take 2,000 mcg by mouth every morning.   . DULoxetine (CYMBALTA) 30 MG capsule TK ONE C PO QD  . [DISCONTINUED] Fluticasone-Salmeterol (ADVAIR) 100-50 MCG/DOSE AEPB Inhale 1 puff into the lungs 2 (two) times daily.   No facility-administered encounter medications on file as of 08/06/2018.    Allergies  Allergen Reactions  . Penicillins Hives  . Sulfa Antibiotics Hives and Nausea And Vomiting    7-15   Patient Active Problem List   Diagnosis Date Noted  . Multiple lung nodules on CT 07/29/2018  . Other chest pain 07/28/2018  . Dark stools 07/28/2018  . Dysuria 05/08/2018  . Prediabetes 05/09/2017  . CKD (chronic kidney disease) stage 3, GFR 30-59 ml/min (HCC) 11/08/2016  . Bursitis of right knee 08/07/2016  . Herpes zoster 06/02/2016  . Upper airway cough syndrome 06/01/2016  . Hyperlipidemia 03/22/2016  . Choking 03/15/2016  . GERD (gastroesophageal reflux disease) 03/15/2016  . Graves disease 01/04/2016  . Osteopenia, severe 10/14/2015  . Other constipation 09/01/2015  . Incisional hernia 07/08/2015  . Ventral hernia without obstruction or gangrene 05/12/2015  . Lower back pain 05/12/2015  . Spinal stenosis in cervical region 11/26/2013  .   Cough 03/07/2012  . Depression 08/05/2008  . Personal history of venous thrombosis and embolism 08/05/2008  . Chronic rhinitis 12/29/2007  . Asthma 12/29/2007   Social History   Socioeconomic History  . Marital status: Married    Spouse name: Not on file  . Number of children: 3  . Years of education: Not on file  . Highest education level: Not on file  Occupational History  . Occupation: retired  Social Needs  . Financial resource strain: Not hard at all  .  Food insecurity:    Worry: Never true    Inability: Never true  . Transportation needs:    Medical: No    Non-medical: No  Tobacco Use  . Smoking status: Never Smoker  . Smokeless tobacco: Never Used  Substance and Sexual Activity  . Alcohol use: Yes    Comment: minimal  . Drug use: No  . Sexual activity: Not Currently  Lifestyle  . Physical activity:    Days per week: 3 days    Minutes per session: 30 min  . Stress: Only a little  Relationships  . Social connections:    Talks on phone: More than three times a week    Gets together: More than three times a week    Attends religious service: More than 4 times per year    Active member of club or organization: Yes    Attends meetings of clubs or organizations: More than 4 times per year    Relationship status: Married  . Intimate partner violence:    Fear of current or ex partner: No    Emotionally abused: No    Physically abused: No    Forced sexual activity: No  Other Topics Concern  . Not on file  Social History Narrative  . Not on file    Ms. Raj's family history includes Allergies in her brother; Asthma in her brother; Bladder Cancer in her brother; Breast cancer in her daughter; Clotting disorder in her father; Diabetes in her daughter and son; Heart attack in her brother; Heart disease in her brother; Mental illness in her son; Skin cancer in her father and mother; Sudden death in her unknown relative.      Objective:    Vitals:   08/06/18 0822  BP: (!) 96/56  Pulse: 89    Physical Exam; well-developed elderly white female in no acute distress, very pleasant, appears younger than stated age blood pressure 96/56 pulse 89, height 5 foot 5, weight 165, BMI 27.5.  HEENT ;nontraumatic normocephalic EOMI PERRLA sclera anicteric, oropharynx benign, neck supple, cardiovascular regular rate and rhythm with S1-S2 no murmur rub or gallop, Pulmonary ;clear bilaterally, Abdomen; soft, bowel sounds are present no palpable  mass or hepatosplenomegaly she has very minimal tenderness in the left lower quadrant left mid quadrant no guarding or rebound, Rectal; exam stool brown and Hemoccult negative, Extremities; no clubbing cyanosis or edema skin warm and dry, Neuro psych; alert and oriented, grossly nonfocal mood and affect appropriate       Assessment & Plan:   #1 84-year-old white female with recent dark stools, present over about a 2-week period-fecal occult blood testing was negative on July 29, and Hemoccult again negative for blood today.  Patient has no evidence of GI bleeding, and was reassured of that today  #2 left lower quadrant discomfort-mild.  Rule out mild diverticulitis versus symptomatic diverticular disease, versus secondary to constipation. #3 internal hemorrhoids #4.  GERD stable #5.  Status post cholecystectomy #6.    Chronic kidney disease stage III  Plan; Long discussion with patient today, she is reassured that changes in her stools were likely due to something she had eaten, there is no evidence of GI bleeding and she is reassured. She prefers not to take antibiotics at this time I am not convinced she has diverticulitis so will observe.  She will continue high-fiber diet, avoid nuts and popcorn.  Increase water intake. She is asked to call in the next week or so if she has any worsening of left lower  quadrant discomfort-and would start her on a course of antibiotics at that time. She will follow-up with Dr. Fuller Plan or myself on an as-needed basis.  Greater than 50% of visit spent in education, counseling and management of GI disease.  Waver Dibiasio S Vincie Linn PA-C 08/06/2018   Cc: Binnie Rail, MD

## 2018-08-06 NOTE — Patient Instructions (Signed)
Call us if the lower abdominal pain worsens over the next couple of weeks . Ask for Faith Edwards's nurse.  [phone], choose option 2 and ask for Faith Edwards's nurse.  Normal BMI (Body Mass Index- based on height and weight) is between 23 and 30. Your BMI today is Body mass index is 27.52 kg/m. Marland Kitchen Please consider follow up  regarding your BMI with your Primary Care Provider.

## 2018-08-07 ENCOUNTER — Encounter: Payer: Self-pay | Admitting: Allergy

## 2018-08-07 ENCOUNTER — Ambulatory Visit (INDEPENDENT_AMBULATORY_CARE_PROVIDER_SITE_OTHER): Payer: Medicare Other | Admitting: Allergy

## 2018-08-07 VITALS — BP 112/68 | HR 78 | Temp 98.0°F | Resp 16 | Ht 65.0 in | Wt 165.0 lb

## 2018-08-07 DIAGNOSIS — R05 Cough: Secondary | ICD-10-CM | POA: Diagnosis not present

## 2018-08-07 DIAGNOSIS — R053 Chronic cough: Secondary | ICD-10-CM

## 2018-08-07 DIAGNOSIS — J3089 Other allergic rhinitis: Secondary | ICD-10-CM

## 2018-08-07 MED ORDER — AZELASTINE HCL 0.1 % NA SOLN: 2.0000 | Freq: Two times a day (BID) | NASAL | 5 refills | Status: DC

## 2018-08-07 NOTE — Patient Instructions (Addendum)
Cough   - multifactorial likely due to post-nasal drainage and reflux   - environmental allergy skin testing today is positive grass pollen, weed pollen, mold and cockroach.  Allergen avoidance measures discussed and handouts provided   - for post-nasal drainage recommend use of nasal antihistamine, Astelin 1-2 sprays twice a day   - continue your Allegra daily   - take your ranitidine 150mg  twice a day to control reflux (you could also have silent reflux which may not present with heartburn symptoms)     - have access to albuterol inhaler 2 puffs every 4-6 hours as needed for cough/wheeze/shortness of breath/chest tightness.  May use 15-20 minutes prior to activity.   Monitor frequency of use.     Follow-up 4-6 months or sooner if needed

## 2018-08-07 NOTE — Progress Notes (Signed)
New Patient Note  RE: Faith Edwards MRN: [id number] DOB: 1932/06/11 Date of Office Visit: 08/07/2018  Referring provider: Binnie Rail, MD Primary care provider: Binnie Rail, MD  Chief Complaint: cough  History of present illness: Faith Edwards is a 84 y.o. female presenting today for consultation for chronic cough. She reports that she has had a cough productive of clear mucous for years, but it has gotten worse since this past spring. The coughing occurs all day long and also wakes her up at night. She has noticed that carpeted rooms set off her cough. She also reports that she has mildew and maybe mold in her house. She endorses congestion, post-nasal drip, and occasional burning/itching eyes. She had allergy testing performed in the 1990s, which was positive for beech and birch trees. She began taking Allegra one week ago and did not notice much difference. She has not taken any antihistamines in the past 3 days in anticipation of this visit. She states that she does not have a history of asthma, but has an albuterol inhaler. She will use this inhaler when she wants to quiet her cough in a public place, but she does not believe it helps at all. She hasn't used the albuterol in months. She also has a history of GERD and hiatal hernia. She takes ranitidine PRN for symptoms, and only takes this about 2 times per month. She has no history of food allergies or eczema.    Review of systems: Review of Systems  Constitutional: Negative for chills, fever and malaise/fatigue.  HENT: Positive for congestion. Negative for ear discharge, ear pain, nosebleeds and sore throat.   Eyes: Negative for pain, discharge and redness.  Respiratory: Positive for cough. Negative for sputum production, shortness of breath and wheezing.   Cardiovascular: Negative for chest pain.  Gastrointestinal: Negative for abdominal pain, constipation, diarrhea, heartburn, nausea and vomiting.  Musculoskeletal: Negative  for joint pain.  Skin: Negative for itching and rash.  Neurological: Negative for headaches.    All other systems negative unless noted above in HPI  Past medical history: Past Medical History:  Diagnosis Date  . Anxiety   . Arthritis   . Asthma   . Bronchitis    hx of   . Chronic headaches    [facility]  . Cystitis    1954  . Depression   . Ear pain    bilat   . Hiatal hernia   . History of blood transfusion    2010  . Hx of blood clots 2007 & 2010   post TKR  . Hyperthyroidism   . Pelvic fracture (HCC)    hx of in three places secondary to fall   . Pneumonia    hx of 1952  . Pulmonary embolism ([facility]) 1993   on HRT  . Right wrist fracture    hx of 1995 has plate secondary to fall   . Shaking    hands bilat  . Shortness of breath dyspnea   . Skin cancer    basal cell   . Spinal stenosis   . Trauma    left lower leg 10/22/2014   . Urinary frequency   . Urticaria     Past surgical history: Past Surgical History:  Procedure Laterality Date  . ABDOMINAL HYSTERECTOMY    . ADENOIDECTOMY    . BREAST ENHANCEMENT SURGERY  1985  . CHOLECYSTECTOMY    . COLONOSCOPY     diverticulosis  . ESI  11/2013    C7-T1 ; Dr Nelva Bush  . EYE SURGERY     bilat cataract surgery   . INSERTION OF MESH N/A 07/08/2015   Procedure: INSERTION OF MESH;  Surgeon: Armandina Gemma, MD;  Location: [facility];  Service: General;  Laterality: N/A;  . REPLACEMENT TOTAL KNEE BILATERAL  2007 & 2010  . SP FACET INJECTION  01/19/14   RC2-3,3-4,C4-5  . TONSILLECTOMY    . VENTRAL HERNIA REPAIR N/A 07/08/2015   Procedure: VENTRAL ADULT REPAIR VENTRAL INCISIONAL HERNIA REPAIR;  Surgeon: Armandina Gemma, MD;  Location: [facility];  Service: General;  Laterality: N/A;  . WRIST SURGERY Left 06/2016   plate     Family history:  Family History  Problem Relation Age of Onset  . Allergies Brother   . Asthma Brother   . Heart disease Brother   . Clotting disorder Father        PTE X 2  . Skin cancer Father   .  Skin cancer Mother   . Sudden death Unknown        2 M aunts & 2 M uncles in 28s  . Heart attack Brother        > 55  . Bladder Cancer Brother   . Diabetes Daughter   . Breast cancer Daughter   . Diabetes Son   . Mental illness Son        suicide  . Stroke Neg Hx     Social history: Lives in a kind home without carpeting with electric heating and central cooling.  There is a dog in the home.  There is concern for mildew in the home.  No concern for roaches in the home.  She is retired.  She denies a smoking history.  Medication List: Allergies as of 08/07/2018      Reactions   Penicillins Hives   Sulfa Antibiotics Hives, Nausea And Vomiting   7-15      Medication List        Accurate as of 08/07/18  2:55 PM. Always use your most recent med list.          albuterol 108 (90 Base) MCG/ACT inhaler Commonly known as:  PROVENTIL HFA;VENTOLIN HFA Inhale 2 puffs into the lungs every 6 (six) hours as needed for wheezing or shortness of breath.   atorvastatin 20 MG tablet Commonly known as:  LIPITOR Take 20 mg by mouth daily.   calcium-vitamin D 500-200 MG-UNIT tablet Commonly known as:  OSCAL WITH D Take 1 tablet by mouth.   cholecalciferol 1000 units tablet Commonly known as:  VITAMIN D Take 1,000 Units by mouth daily.   Cyanocobalamin 1000 MCG/15ML Liqd Take 2,000 mcg by mouth every morning.   DULoxetine 30 MG capsule Commonly known as:  CYMBALTA TK ONE C PO QD   ranitidine 150 MG/10ML syrup Commonly known as:  ZANTAC Take 150 mg by mouth as needed for heartburn.       Known medication allergies: Allergies  Allergen Reactions  . Penicillins Hives  . Sulfa Antibiotics Hives and Nausea And Vomiting    7-15     Physical examination: Blood pressure 112/68, pulse 78, temperature 98 F (36.7 C), temperature source Oral, resp. rate 16, height 5\' 5"  (1.651 m), weight 165 lb (74.8 kg), SpO2 96 %.  General: Alert, interactive, in no acute distress. HEENT:  PERRLA, TMs pearly gray, turbinates edematous and pale with clear discharge, post-pharynx mildly erythematous. Neck: Supple without lymphadenopathy. Lungs: Clear to auscultation without wheezing,  rhonchi or rales. {no increased work of breathing. CV: Normal S1, S2 without murmurs. Abdomen: Nondistended, nontender. Skin: Warm and dry, without lesions or rashes. Extremities:  No clubbing, cyanosis or edema. Neuro:   Grossly intact.  Diagnositics/Labs:  Spirometry: FEV1: 1.52L 80%, FVC: 1.87L 73%, ratio consistent with nonobstructive pattern  Allergy testing: Environmental allergy skin prick testing is positive to [place], sheep Sorrell, Phoma betae and cockroach.   allergy testing results were read and interpreted by provider, documented by clinical staff. Positive to grass pollen, weed pollen, mold, and cockroach.   Assessment and plan:   Chronic Cough due to allergic rhinitis   - multifactorial likely due to post-nasal drainage and reflux   - environmental allergy skin testing today is positive grass pollen, weed pollen, mold and cockroach.  Allergen avoidance measures discussed and handouts provided   - for post-nasal drainage recommend use of nasal antihistamine, Astelin 1-2 sprays twice a day   - continue your Allegra daily   - take your ranitidine 150mg  twice a day to control reflux (you could also have silent reflux which may not present with heartburn symptoms)     - have access to albuterol inhaler 2 puffs every 4-6 hours as needed for cough/wheeze/shortness of breath/chest tightness.  May use 15-20 minutes prior to activity.   Monitor frequency of use.     Follow-up 4-6 months or sooner if needed  I appreciate the opportunity to take part in Faith Edwards's care. Please do not hesitate to contact me with questions.  Sincerely,   Prudy Feeler, MD Allergy/Immunology [facility]

## 2018-08-08 ENCOUNTER — Encounter: Payer: Self-pay | Admitting: Internal Medicine

## 2018-08-08 ENCOUNTER — Ambulatory Visit (INDEPENDENT_AMBULATORY_CARE_PROVIDER_SITE_OTHER): Payer: Medicare Other | Admitting: Internal Medicine

## 2018-08-08 VITALS — BP 128/72 | HR 98 | Ht 65.0 in | Wt 166.0 lb

## 2018-08-08 DIAGNOSIS — R918 Other nonspecific abnormal finding of lung field: Secondary | ICD-10-CM

## 2018-08-08 DIAGNOSIS — R05 Cough: Secondary | ICD-10-CM

## 2018-08-08 DIAGNOSIS — R058 Other specified cough: Secondary | ICD-10-CM

## 2018-08-08 NOTE — Assessment & Plan Note (Addendum)
<   5 mm, never smoker - seen on Ct Angio of chest 07/29/18 > no directed f/u indicated   CT results reviewed with pt >>> Too small for PET or bx, not suspicious enough for excisional bx > really only option for now is follow the Fleischner society guidelines as rec by radiology.   Discussed in detail all the  indications, usual  risks and alternatives  relative to the benefits with patient who agrees to proceed with conservative f/u as outlined = no directed f/u needed

## 2018-08-08 NOTE — Patient Instructions (Addendum)
Try prilosec otc 20mg   Take 30-60 min before first meal of the day and Ranitidine to where you take it after supper and at bedtime  until cough is completely gone for at least a week without the need for cough suppression  GERD (REFLUX)  is an extremely common cause of respiratory symptoms just like yours , many times with no obvious heartburn at all.    It can be treated with medication, but also with lifestyle changes including elevation of the head of your bed (ideally with 6 inch  bed blocks),  Smoking cessation, avoidance of late meals, excessive alcohol, and avoid fatty foods, chocolate, peppermint, colas, red wine, and acidic juices such as orange juice.  NO MINT OR MENTHOL PRODUCTS SO NO COUGH DROPS   USE SUGARLESS CANDY INSTEAD (Jolley ranchers or Stover's or Life Savers) or even ice chips will also do - the key is to swallow to prevent all throat clearing. NO OIL BASED VITAMINS - use powdered substitutes.   For drainage / throat tickle try take CHLORPHENIRAMINE  4 mg - take one every 4 hours as needed - available over the counter- may cause drowsiness so start with just a bedtime dose or two and see how you tolerate it before trying in daytime     For cough > delsym 2 tsp every 12 hours as needed    If not better after you complete you allergy work up and treatment then return here with all medications in hand

## 2018-08-08 NOTE — Assessment & Plan Note (Signed)
Recurrent mid may 2017 off all gerd rx and again Jan 2019 off gerd maint rx > resumed 08/08/2018    Upper airway cough syndrome (previously labeled PNDS),  is so named because it's frequently impossible to sort out how much is  CR/sinusitis with freq throat clearing (which can be related to primary GERD)   vs  causing  secondary (" extra esophageal")  GERD from wide swings in gastric pressure that occur with throat clearing, often  promoting self use of mint and menthol lozenges that reduce the lower esophageal sphincter tone and exacerbate the problem further in a cyclical fashion.   These are the same pts (now being labeled as having "irritable larynx syndrome" by some cough centers) who not infrequently have a history of having failed to tolerate ace inhibitors,  dry powder inhalers or biphosphonates or report having atypical/extraesophageal reflux symptoms that don't respond to standard doses of PPI  and are easily confused as having aecopd or asthma flares by even experienced allergists/ pulmonologists (myself included).    Of the three most common causes of  Sub-acute / recurrent or chronic cough, only one (GERD)  can actually contribute to/ trigger  the other two (asthma and post nasal drip syndrome)  and perpetuate the cylce of cough.  While not intuitively obvious, many patients with chronic low grade reflux do not cough until there is a primary insult that disturbs the protective epithelial barrier and exposes sensitive nerve endings.   This is typically viral but can due to PNDS and  either may apply here.   The point is that once this occurs, it is difficult to eliminate the cycle  using anything but a maximally effective acid suppression regimen at least in the short run, accompanied by an appropriate diet to address non acid GERD and control / eliminate pnds with 1st gen H1 per guidelines.    If not controlled needs to return with all meds in hand using a trust but verify approach to confirm  accurate Medication  Reconciliation The principal here is that until we are certain that the  patients are doing what we've asked, it makes no sense to ask them to do more.    I had an extended discussion with the patient reviewing all relevant studies completed to date and  lasting 25 minutes of a 40  minute acute office visit addressing  severe non-specific but potentially very serious refractory respiratory symptoms of uncertain and potentially multiple  etiologies.   Each maintenance medication was reviewed in detail including most importantly the difference between maintenance and prns and under what circumstances the prns are to be triggered using an action plan format that is not reflected in the computer generated alphabetically organized AVS.    Please see AVS for specific instructions unique to this office visit that I personally wrote and verbalized to the the pt in detail and then reviewed with pt  by my nurse highlighting any changes in therapy/plan of care  recommended at today's visit.

## 2018-08-08 NOTE — Progress Notes (Signed)
Subjective:    Patient ID: Faith Edwards, female    DOB: Jan 25, 1932 .   MRN: [id number]  Brief patient profile:  86 yowf never smoker  eval 2013 for cough variant asthma better on qvar/gerd rx but did not stay on any maint rx just occasional saba with specific exposures maybe twice a month and did fine off gerd rx  until Mid may 2017 acute onset uri/sinus infection/ cough / r lat pleuritic cp   > [facility] eval rx shot/prednisone/zpak> not better so referred to pulmonary clinic 05/31/2016 by Dr Billey Gosling.   History of Present Illness  05/31/2016  Pulmonary consultation/ office visit/ Edinson Domeier  Only rx = saba prn but hfa poor  Chief Complaint  Patient presents with  . Pulmonary Consult    Referred by Dr. Quay Burow; chest congestion, coughing up cloudy mucus.  chest tightness.  discuss air travel, precautions for blood clots; ribs are sore from coughing  hx as above, more coughing issues than sob/  worse cp with coughing fits - the pain has improved despite the coughing continuing, never started back on gerd rx as prev rec Hx indicates c/o choking and dysphagia for months prior to onset of cough  - saw GI PA 03/29/16 with D[facility] pos only for [facility] and was instructed to stop H1 at this point. rec  Only use your albuterol (ventolin) as a rescue medication  Try prilosec otc 20mg   Take 30-60 min before first meal of the day and Pepcid ac (famotidine) 20 mg one @  bedtime until cough is completely gone for at least a week without the need for cough suppression GERD .diet  If not all better in 2 weeks return to clinic  > did not return    Allergy eval [facility] 08/07/18  Spirometry: FEV1: 1.52L 80%, FVC: 1.87L 73%, ratio consistent with nonobstructive pattern  Allergy testing: Environmental allergy skin prick testing is positive to Congo, sheep Sorrell, Phoma betae and cockroach.   allergy testing results were read and interpreted by provider, documented by clinical staff. Positive to grass pollen, weed pollen,  mold, and cockroach.  Imp/Rec:  Chronic Cough due to allergic rhinitis   - multifactorial likely due to post-nasal drainage and reflux   - environmental allergy skin testing today is positive grass pollen, weed pollen, mold and cockroach.  Allergen avoidance measures discussed and handouts provided   - for post-nasal drainage recommend use of nasal antihistamine, Astelin 1-2 sprays twice a day   - continue your Allegra daily   - take your ranitidine 150mg  twice a day to control reflux (you could also have silent reflux which may not present with heartburn symptoms)     - have access to albuterol inhaler 2 puffs every 4-6 hours as needed for cough/wheeze/shortness of breath/chest tightness.  May use 15-20 minutes prior to activity.   Monitor frequency of use.         08/08/2018  Acute extended ov/[facility] Bossi re: re-establish re cough/sob recurrent/ abn ct chest  Chief Complaint  Patient presents with  . Acute Visit    Pt c/o increased SOB, cough and chest discomfort since Jan 2019. She had CTA on 07/29/18- no PE but nodules present.    Dyspnea:  Ok unless coughing but fatigued all the time  Cough: dry day > noct, non-productive, indolent onset Jan 2019 persistent daily  Developed off gerd rx  Sleeping: disturbed by coughing  SABA use: not using / no better on advair or saba  02: none  gen anterior chest pain with coughing only  > CTa 07/29/18 >>No evidence of pulmonary emboli. Tiny nodules in the right lung along the major fissure measuring less than 5 mm. No follow-up needed if patient is low-risk (and has no known or suspected primary neoplasm).     No obvious patterns in day to day or daytime variability or assoc excess/ purulent sputum or mucus plugs or hemoptysis or   chest tightness, subjective wheeze or overt sinus or hb symptoms.    Also denies any obvious fluctuation of symptoms with weather or environmental changes or other aggravating or alleviating factors except as outlined  above   No unusual exposure hx or h/o childhood pna/ asthma or knowledge of premature birth.  Current Allergies, Complete Past Medical History, Past Surgical History, Family History, and Social History were reviewed in [employer] record.  ROS  The following are not active complaints unless bolded Hoarseness, sore throat, dysphagia, dental problems, itching, sneezing,  nasal congestion or discharge of excess mucus or purulent secretions, ear ache,   fever, chills, sweats, unintended wt loss or wt gain, classically pleuritic or exertional cp,  orthopnea pnd or arm/hand swelling  or leg swelling, presyncope, palpitations, abdominal pain, anorexia, nausea, vomiting, diarrhea  or change in bowel habits or change in bladder habits, change in stools or change in urine, dysuria, hematuria,  rash, arthralgias, visual complaints, headache, numbness, weakness or ataxia or problems with walking or coordination,  change in mood= anxious  or  memory.        Current Meds  Medication Sig  . albuterol (PROVENTIL HFA;VENTOLIN HFA) 108 (90 BASE) MCG/ACT inhaler Inhale 2 puffs into the lungs every 6 (six) hours as needed for wheezing or shortness of breath.  Marland Kitchen atorvastatin (LIPITOR) 20 MG tablet Take 20 mg by mouth daily.  Marland Kitchen azelastine (ASTELIN) 0.1 % nasal spray Place 2 sprays into both nostrils 2 (two) times daily.  . cholecalciferol (VITAMIN D) 1000 UNITS tablet Take 1,000 Units by mouth daily.  . Cyanocobalamin 1000 MCG/15ML LIQD Take 2,000 mcg by mouth every morning.   . DULoxetine (CYMBALTA) 30 MG capsule TK ONE C PO QD             .       Objective:   Physical Exam    amb wf nad   08/08/2018         166    05/31/16 172 lb 6.4 oz (78.2 kg)  05/30/16 173 lb (78.472 kg)  05/23/16 173 lb (78.472 kg)    Vital signs reviewed - Note on arrival 02 sats  94% on  RA      HEENT: nl dentition, turbinates bilaterally, and oropharynx. Nl external ear canals without cough  reflex   NECK :  without JVD/Nodes/TM/ nl carotid upstrokes bilaterally   LUNGS: no acc muscle use,  Nl contour chest which is clear to A and P bilaterally with cough early on insp / none on exp  CV:  RRR  no s3 or murmur or increase in P2, and no edema   ABD:  soft and nontender with nl inspiratory excursion in the supine position. No bruits or organomegaly appreciated, bowel sounds nl  MS:  Nl gait/ ext warm without deformities, calf tenderness, cyanosis or clubbing No obvious joint restrictions   SKIN: warm and dry without lesions    NEURO:  alert, approp, nl sensorium with  no motor or cerebellar deficits apparent.      I personally  reviewed images and agree with radiology impression as follows:   Chest CTa 07/29/18 as above     Assessment & Plan:

## 2018-08-11 NOTE — Progress Notes (Signed)
Reviewed and agree with management plan.  Ascencion Coye T. Derion Kreiter, MD FACG 

## 2018-08-12 ENCOUNTER — Ambulatory Visit: Payer: Self-pay | Admitting: Allergy and Immunology

## 2018-08-15 ENCOUNTER — Telehealth: Payer: Self-pay | Admitting: Gastroenterology

## 2018-08-15 ENCOUNTER — Other Ambulatory Visit: Payer: Self-pay

## 2018-08-15 DIAGNOSIS — R195 Other fecal abnormalities: Secondary | ICD-10-CM

## 2018-08-15 NOTE — Telephone Encounter (Signed)
Please have her do hemocults and get a cbc  She had liver labs a couple weeks ago which were normal, and when I saw her very recently -stool was heme negative

## 2018-08-15 NOTE — Telephone Encounter (Signed)
I spoke with the patient and gave her this information.  Orders placed in Epic.

## 2018-08-15 NOTE — Telephone Encounter (Signed)
Patient is "seeing black stools again. That is not normal." She denies tarry or sticky stools. She states she alternates between hard stool and loose stools. One stool today that describes as "diarrhea." She denies nausea. She denies indigestion though she does have off and on stomach discomfort that occurs after she eats. She has put herself on the [employer]. Her daughter is a Marine scientist and recommended this. The patient has "googled this" and read the black stool can mean liver problems or bleeding. She is not taking any OTC medications.  She is adamant she needs to be seen or have labs. She cannot come in today because she is in [place] with her daughter. Please advise.

## 2018-08-18 ENCOUNTER — Other Ambulatory Visit (INDEPENDENT_AMBULATORY_CARE_PROVIDER_SITE_OTHER): Payer: Medicare Other

## 2018-08-18 DIAGNOSIS — R195 Other fecal abnormalities: Secondary | ICD-10-CM

## 2018-08-18 LAB — CBC WITH DIFFERENTIAL/PLATELET
BASOS PCT: 0.5 % (ref 0.0–3.0)
Basophils Absolute: 0 10*3/uL (ref 0.0–0.1)
Eosinophils Absolute: 0.2 10*3/uL (ref 0.0–0.7)
Eosinophils Relative: 2.4 % (ref 0.0–5.0)
HCT: 39 % (ref 36.0–46.0)
HEMOGLOBIN: 12.7 g/dL (ref 12.0–15.0)
Lymphocytes Relative: 29.8 % (ref 12.0–46.0)
Lymphs Abs: 2.4 10*3/uL (ref 0.7–4.0)
MCHC: 32.6 g/dL (ref 30.0–36.0)
MCV: 93.7 fl (ref 78.0–100.0)
MONOS PCT: 11.9 % (ref 3.0–12.0)
Monocytes Absolute: 1 10*3/uL (ref 0.1–1.0)
NEUTROS ABS: 4.5 10*3/uL (ref 1.4–7.7)
Neutrophils Relative %: 55.4 % (ref 43.0–77.0)
Platelets: 304 10*3/uL (ref 150.0–400.0)
RBC: 4.16 Mil/uL (ref 3.87–5.11)
RDW: 13.1 % (ref 11.5–15.5)
WBC: 8.2 10*3/uL (ref 4.0–10.5)

## 2018-08-25 ENCOUNTER — Other Ambulatory Visit: Payer: Medicare Other

## 2018-08-25 DIAGNOSIS — R195 Other fecal abnormalities: Secondary | ICD-10-CM

## 2018-08-26 ENCOUNTER — Other Ambulatory Visit: Payer: Self-pay

## 2018-08-26 ENCOUNTER — Other Ambulatory Visit (INDEPENDENT_AMBULATORY_CARE_PROVIDER_SITE_OTHER): Payer: Medicare Other

## 2018-08-26 DIAGNOSIS — R195 Other fecal abnormalities: Secondary | ICD-10-CM | POA: Diagnosis not present

## 2018-08-26 LAB — FECAL OCCULT BLOOD, IMMUNOCHEMICAL: FECAL OCCULT BLD: POSITIVE — AB

## 2018-08-29 ENCOUNTER — Ambulatory Visit: Payer: Medicare Other | Admitting: Internal Medicine

## 2018-09-11 ENCOUNTER — Ambulatory Visit (INDEPENDENT_AMBULATORY_CARE_PROVIDER_SITE_OTHER): Payer: Medicare Other | Admitting: Physician Assistant

## 2018-09-11 ENCOUNTER — Other Ambulatory Visit (INDEPENDENT_AMBULATORY_CARE_PROVIDER_SITE_OTHER): Payer: Medicare Other

## 2018-09-11 ENCOUNTER — Encounter: Payer: Self-pay | Admitting: Physician Assistant

## 2018-09-11 ENCOUNTER — Encounter (INDEPENDENT_AMBULATORY_CARE_PROVIDER_SITE_OTHER): Payer: Self-pay

## 2018-09-11 VITALS — BP 110/72 | HR 78 | Ht 66.0 in | Wt 161.5 lb

## 2018-09-11 DIAGNOSIS — K5900 Constipation, unspecified: Secondary | ICD-10-CM | POA: Diagnosis not present

## 2018-09-11 DIAGNOSIS — R300 Dysuria: Secondary | ICD-10-CM | POA: Diagnosis not present

## 2018-09-11 DIAGNOSIS — R195 Other fecal abnormalities: Secondary | ICD-10-CM | POA: Diagnosis not present

## 2018-09-11 DIAGNOSIS — R194 Change in bowel habit: Secondary | ICD-10-CM | POA: Diagnosis not present

## 2018-09-11 LAB — CBC WITH DIFFERENTIAL/PLATELET
BASOS ABS: 0 10*3/uL (ref 0.0–0.1)
Basophils Relative: 0.4 % (ref 0.0–3.0)
EOS ABS: 0.2 10*3/uL (ref 0.0–0.7)
Eosinophils Relative: 2.7 % (ref 0.0–5.0)
HEMATOCRIT: 39.7 % (ref 36.0–46.0)
Hemoglobin: 13 g/dL (ref 12.0–15.0)
LYMPHS ABS: 2.5 10*3/uL (ref 0.7–4.0)
LYMPHS PCT: 32.1 % (ref 12.0–46.0)
MCHC: 32.7 g/dL (ref 30.0–36.0)
MCV: 93.3 fl (ref 78.0–100.0)
Monocytes Absolute: 1.1 10*3/uL — ABNORMAL HIGH (ref 0.1–1.0)
Monocytes Relative: 13.8 % — ABNORMAL HIGH (ref 3.0–12.0)
NEUTROS ABS: 4 10*3/uL (ref 1.4–7.7)
Neutrophils Relative %: 51 % (ref 43.0–77.0)
PLATELETS: 286 10*3/uL (ref 150.0–400.0)
RBC: 4.26 Mil/uL (ref 3.87–5.11)
RDW: 13.2 % (ref 11.5–15.5)
WBC: 7.9 10*3/uL (ref 4.0–10.5)

## 2018-09-11 LAB — URINALYSIS, ROUTINE W REFLEX MICROSCOPIC
Bilirubin Urine: NEGATIVE
Hgb urine dipstick: NEGATIVE
KETONES UR: NEGATIVE
Nitrite: POSITIVE — AB
RBC / HPF: NONE SEEN (ref 0–?)
Specific Gravity, Urine: 1.015 (ref 1.000–1.030)
Total Protein, Urine: NEGATIVE
Urine Glucose: NEGATIVE
Urobilinogen, UA: 0.2 (ref 0.0–1.0)
pH: 7 (ref 5.0–8.0)

## 2018-09-11 NOTE — Patient Instructions (Addendum)
Your provider has requested that you go to the basement level for lab work and a urine test before leaving today. Press "B" on the elevator. The lab is located at the first door on the left as you exit the elevator. Take Benefiber - 1 dose daily.  We did put you on a wait list for an appointment for the procedures with morning hours.    You have been scheduled for an endoscopy and colonoscopy. Please follow the written instructions given to you at your visit today. Please pick up your prep supplies at the pharmacy within the next 1-3 days. If you use inhalers (even only as needed), please bring them with you on the day of your procedure.

## 2018-09-11 NOTE — Progress Notes (Signed)
Subjective:    Patient ID: Faith Edwards, female    DOB: 01-29-1932, 84 y.o.   MRN: [id number]  HPI Faith Edwards is a pleasant 84 year old white female, known to Faith Edwards who was seen in the office about a month ago by myself with complaints of dark stools.  She had undergone stool for occult blood testing after that complaint which was heme negative, and hemoglobin was normal.  Had rectal exam, by myself at that office visit and was Hemoccult negative.  She was reassured that there was no evidence of GI bleeding. She had undergone colonoscopy most recently in September 2016 for complaints of hematochezia.  She was noted to have internal hemorrhoids and  sigmoid diverticulosis but no polyps. Patient called back again in mid August and had again seen very dark stool.  CBC was done and found hemoglobin 12.7 hematocrit of 39 MCV of 93.  We had her do Hemoccult again and this time returned positive. She says she is very concerned and worried about colon cancer.  She is also developed constipation quiring Her to Take Milk Of Magnesia.  She has noticed some piecey type stools feels that at times her stools are more narrower.  She is also complaining of fatigue.  She wants to have endoscopic evaluation to have a definite answer to her issues.  Review of Systems Pertinent positive and negative review of systems were noted in the above HPI section.  All other review of systems was otherwise negative.  Outpatient Encounter Medications as of 09/11/2018  Medication Sig  . albuterol (PROVENTIL HFA;VENTOLIN HFA) 108 (90 BASE) MCG/ACT inhaler Inhale 2 puffs into the lungs every 6 (six) hours as needed for wheezing or shortness of breath.  Marland Kitchen atorvastatin (LIPITOR) 20 MG tablet Take 20 mg by mouth daily.  Marland Kitchen azelastine (ASTELIN) 0.1 % nasal spray Place 2 sprays into both nostrils 2 (two) times daily.  . cholecalciferol (VITAMIN D) 1000 UNITS tablet Take 1,000 Units by mouth daily.  . Cyanocobalamin 1000 MCG/15ML LIQD  Take 2,000 mcg by mouth every morning.   . DULoxetine (CYMBALTA) 30 MG capsule TK ONE C PO QD  . [DISCONTINUED] Fluticasone-Salmeterol (ADVAIR) 100-50 MCG/DOSE AEPB Inhale 1 puff into the lungs 2 (two) times daily.   No facility-administered encounter medications on file as of 09/11/2018.    Allergies  Allergen Reactions  . Penicillins Hives  . Sulfa Antibiotics Hives and Nausea And Vomiting    82-15   Patient Active Problem List   Diagnosis Date Noted  . Multiple lung nodules on CT 07/29/2018  . Other chest pain 07/28/2018  . Dark stools 07/28/2018  . Dysuria 05/08/2018  . Prediabetes 05/09/2017  . CKD (chronic kidney disease) stage 3, GFR 30-59 ml/min (HCC) 11/08/2016  . Bursitis of right knee 08/07/2016  . Herpes zoster 06/02/2016  . Upper airway cough syndrome 06/01/2016  . Hyperlipidemia 03/22/2016  . Choking 03/15/2016  . GERD (gastroesophageal reflux disease) 03/15/2016  . Graves disease 01/04/2016  . Osteopenia, severe 10/14/2015  . Other constipation 09/01/2015  . Incisional hernia 07/08/2015  . Ventral hernia without obstruction or gangrene 05/12/2015  . Lower back pain 05/12/2015  . Spinal stenosis in cervical region 11/26/2013  . Cough 03/07/2012  . Depression 08/05/2008  . Personal history of venous thrombosis and embolism 08/05/2008  . Chronic rhinitis 12/29/2007  . Asthma 12/29/2007   Social History   Socioeconomic History  . Marital status: Married    Spouse name: Not on file  . Number of  children: 3  . Years of education: Not on file  . Highest education level: Not on file  Occupational History  . Occupation: retired  Scientific laboratory technician  . Financial resource strain: Not hard at all  . Food insecurity:    Worry: Never true    Inability: Never true  . Transportation needs:    Medical: No    Non-medical: No  Tobacco Use  . Smoking status: Never Smoker  . Smokeless tobacco: Never Used  Substance and Sexual Activity  . Alcohol use: Yes    Comment:  minimal  . Drug use: No  . Sexual activity: Not Currently  Lifestyle  . Physical activity:    Days per week: 3 days    Minutes per session: 30 min  . Stress: Only a little  Relationships  . Social connections:    Talks on phone: More than three times a week    Gets together: More than three times a week    Attends religious service: More than 4 times per year    Active member of club or organization: Yes    Attends meetings of clubs or organizations: More than 4 times per year    Relationship status: Married  . Intimate partner violence:    Fear of current or ex partner: No    Emotionally abused: No    Physically abused: No    Forced sexual activity: No  Other Topics Concern  . Not on file  Social History Narrative  . Not on file    [name]'s family history includes Allergies in her brother; Asthma in her brother; Bladder Cancer in her brother; Breast cancer in her daughter; Clotting disorder in her father; Diabetes in her daughter and son; Heart attack in her brother; Heart disease in her brother; Mental illness in her son; Skin cancer in her father and mother; Sudden death in her unknown relative.      Objective:    Vitals:   09/11/18 1458  BP: 110/72  Pulse: 78    Physical Exam; developed 84 white female in no acute distress, pleasant blood pressure 110/72 pulse 78, BMI 26.0.  HEENT ;nontraumatic normocephalic EOMI PERRLA sclera anicteric oral mucosa is moist, Cardiovascular; regular rate and rhythm with S1-S2 no murmur rub or gallop, Pulmonary; clear bilaterally, Abdomen soft, no focal tenderness, no palpable mass or hepatosplenomegaly no guarding or rebound bowel sounds are present, Rectal; exam not done recently documented Hemoccult positive, Extremities; no clubbing cyanosis or edema skin warm and dry, Neuro psych; alert and oriented, grossly nonfocal mood and affect appropriate       Assessment & Edwards:   52. 84 year old white female with 82-month history of  intermittent dark stool now new complaint of constipation and vague abdominal discomfort. She had previously been documented heme negative and was not anemic.  However on most recent check stool returned heme positive. Will rule out occult colon or upper gut lesion, AVMs, gastropathy  2 diverticulosis 3.  Status post cholecystectomy 4.  GERD mild stable on Nexium 5.  Asthma 6.  Chronic kidney disease stage III 7.  Hyperlipidemia  Edwards; start Benefiber 1 scoop daily and a large glass of water, encourage continue high-fiber diet and liberal water intake Pete CBC today We will also check UA with complaint of dysuria Patient will be scheduled for upper endoscopy and colonoscopy with Faith Edwards.  Both procedures were discussed in detail with the patient including indications risks and benefits and she is agreeable to proceed  Olivier Frayre Genia Harold PA-C 09/11/2018   Cc: Binnie Rail, MD

## 2018-09-12 ENCOUNTER — Other Ambulatory Visit: Payer: Self-pay | Admitting: Internal Medicine

## 2018-09-12 MED ORDER — NITROFURANTOIN MONOHYD MACRO 100 MG PO CAPS: 100.0000 mg | ORAL_CAPSULE | Freq: Two times a day (BID) | ORAL | 0 refills | Status: DC

## 2018-09-12 NOTE — Progress Notes (Signed)
Reviewed and agree with management plan.  Gaetan Spieker T. Javelle Donigan, MD FACG 

## 2018-10-16 DIAGNOSIS — H9190 Unspecified hearing loss, unspecified ear: Secondary | ICD-10-CM | POA: Diagnosis not present

## 2018-10-16 DIAGNOSIS — H903 Sensorineural hearing loss, bilateral: Secondary | ICD-10-CM | POA: Diagnosis not present

## 2018-10-23 ENCOUNTER — Encounter

## 2018-10-23 ENCOUNTER — Encounter: Payer: Medicare Other | Admitting: Gastroenterology

## 2018-10-23 ENCOUNTER — Encounter: Payer: Self-pay | Admitting: Gastroenterology

## 2018-10-23 ENCOUNTER — Ambulatory Visit (AMBULATORY_SURGERY_CENTER): Payer: Medicare Other | Admitting: Gastroenterology

## 2018-10-23 VITALS — BP 134/76 | HR 82 | Temp 99.3°F | Resp 19 | Ht 66.0 in | Wt 161.0 lb

## 2018-10-23 DIAGNOSIS — K449 Diaphragmatic hernia without obstruction or gangrene: Secondary | ICD-10-CM

## 2018-10-23 DIAGNOSIS — R195 Other fecal abnormalities: Secondary | ICD-10-CM | POA: Diagnosis not present

## 2018-10-23 DIAGNOSIS — R109 Unspecified abdominal pain: Secondary | ICD-10-CM | POA: Diagnosis not present

## 2018-10-23 DIAGNOSIS — E7800 Pure hypercholesterolemia, unspecified: Secondary | ICD-10-CM | POA: Diagnosis not present

## 2018-10-23 DIAGNOSIS — R194 Change in bowel habit: Secondary | ICD-10-CM

## 2018-10-23 DIAGNOSIS — K5730 Diverticulosis of large intestine without perforation or abscess without bleeding: Secondary | ICD-10-CM

## 2018-10-23 DIAGNOSIS — K921 Melena: Secondary | ICD-10-CM | POA: Diagnosis not present

## 2018-10-23 MED ORDER — SODIUM CHLORIDE 0.9 % IV SOLN: 500.0000 mL | Freq: Once | INTRAVENOUS | Status: DC

## 2018-10-23 NOTE — Progress Notes (Signed)
Report given to PACU, vss 

## 2018-10-23 NOTE — Patient Instructions (Signed)
YOU HAD AN ENDOSCOPIC PROCEDURE TODAY AT THE [facility]:   Refer to the procedure report that was given to you for any specific questions about what was found during the examination.  If the procedure report does not answer your questions, please call your gastroenterologist to clarify.  If you requested that your care partner not be given the details of your procedure findings, then the procedure report has been included in a sealed envelope for you to review at your convenience later.  YOU SHOULD EXPECT: Some feelings of bloating in the abdomen. Passage of more gas than usual.  Walking can help get rid of the air that was put into your GI tract during the procedure and reduce the bloating. If you had a lower endoscopy (such as a colonoscopy or flexible sigmoidoscopy) you may notice spotting of blood in your stool or on the toilet paper. If you underwent a bowel prep for your procedure, you may not have a normal bowel movement for a few days.  Please Note:  You might notice some irritation and congestion in your nose or some drainage.  This is from the oxygen used during your procedure.  There is no need for concern and it should clear up in a day or so.  SYMPTOMS TO REPORT IMMEDIATELY:   Following lower endoscopy (colonoscopy or flexible sigmoidoscopy):  Excessive amounts of blood in the stool  Significant tenderness or worsening of abdominal pains  Swelling of the abdomen that is new, acute  Fever of 100F or higher   Following upper endoscopy (EGD)  Vomiting of blood or coffee ground material  New chest pain or pain under the shoulder blades  Painful or persistently difficult swallowing  New shortness of breath  Fever of 100F or higher  Black, tarry-looking stools  For urgent or emergent issues, a gastroenterologist can be reached at any hour by calling [phone].   DIET:  We do recommend a small meal at first, but then you may proceed to your regular diet.  Drink  plenty of fluids but you should avoid alcoholic beverages for 24 hours.  ACTIVITY:  You should plan to take it easy for the rest of today and you should NOT DRIVE or use heavy machinery until tomorrow (because of the sedation medicines used during the test).    FOLLOW UP: Our staff will call the number listed on your records the next business day following your procedure to check on you and address any questions or concerns that you may have regarding the information given to you following your procedure. If we do not reach you, we will leave a message.  However, if you are feeling well and you are not experiencing any problems, there is no need to return our call.  We will assume that you have returned to your regular daily activities without incident.  If any biopsies were taken you will be contacted by phone or by letter within the next 1-3 weeks.  Please call us at [phone] if you have not heard about the biopsies in 3 weeks.    SIGNATURES/CONFIDENTIALITY: You and/or your care partner have signed paperwork which will be entered into your electronic medical record.  These signatures attest to the fact that that the information above on your After Visit Summary has been reviewed and is understood.  Full responsibility of the confidentiality of this discharge information lies with you and/or your care-partner.  Read all of the handouts given to you by your recovery   room nurse. 

## 2018-10-23 NOTE — Op Note (Addendum)
[facility] Patient Name: Nataly Pacifico Procedure Date: 10/23/2018 3:52 PM MRN: [id number] Endoscopist: Ladene Artist , MD Age: 84 Referring MD:  Date of Birth: December 16, 1932 Gender: Female Account #: [id number] Procedure:                Colonoscopy Indications:              Heme positive stool Medicines:                Monitored Anesthesia Care Procedure:                Pre-Anesthesia Assessment:                           - Prior to the procedure, a History and Physical                            was performed, and patient medications and                            allergies were reviewed. The patient's tolerance of                            previous anesthesia was also reviewed. The risks                            and benefits of the procedure and the sedation                            options and risks were discussed with the patient.                            All questions were answered, and informed consent                            was obtained. Prior Anticoagulants: The patient has                            taken no previous anticoagulant or antiplatelet                            agents. ASA Grade Assessment: II - A patient with                            mild systemic disease. After reviewing the risks                            and benefits, the patient was deemed in                            satisfactory condition to undergo the procedure.                           After obtaining informed consent, the colonoscope  was passed under direct vision. Throughout the                            procedure, the patient's blood pressure, pulse, and                            oxygen saturations were monitored continuously. The                            Colonoscope was introduced through the anus and                            advanced to the the cecum, identified by                            appendiceal orifice and ileocecal valve. The                        ileocecal valve, appendiceal orifice, and rectum                            were photographed. The quality of the bowel                            preparation was adequate after extensive lavage and                            suctioning. The patient tolerated the procedure                            well. The colonoscopy was somewhat difficult due to                            a redundant colon, significant looping and a                            tortuous colon. Successful completion of the                            procedure was aided by using manual pressure,                            withdrawing and reinserting the scope,                            straightening and shortening the scope to obtain                            bowel loop reduction, using scope torsion and                            applying abdominal pressure. Scope In: 4:10:15 PM Scope Out: 4:37:50 PM Scope Withdrawal Time: 0 hours 17 minutes 50 seconds  Total Procedure Duration: 0 hours 27 minutes 35 seconds  Findings:  The perianal and digital rectal examinations were                            normal.                           A few medium-mouthed diverticula were found in the                            sigmoid colon.                           The exam was otherwise without abnormality on                            direct and retroflexion views. Complications:            No immediate complications. Estimated blood loss:                            None. Estimated Blood Loss:     Estimated blood loss: none. Impression:               - Diverticulosis in the sigmoid colon.                           - The examination was otherwise normal on direct                            and retroflexion views.                           - No specimens collected. Recommendation:           - Patient has a contact number available for                            emergencies. The signs and symptoms of  potential                            delayed complications were discussed with the                            patient. Return to normal activities tomorrow.                            Written discharge instructions were provided to the                            patient.                           - Resume previous diet.                           - Continue present medications.                           -  No repeat colonoscopy due to age.                           - GI follow up prn.                           - Return to your PCP for ongoing care. Ladene Artist, MD 10/23/2018 4:49:17 PM This report has been signed electronically.

## 2018-10-23 NOTE — Op Note (Addendum)
[place] Patient Name: Rana Hochstein Procedure Date: 10/23/2018 3:52 PM MRN: [id number] Endoscopist: Ladene Artist , MD Age: 84 Referring MD:  Date of Birth: [birth year] Gender: Female Account #: [id number] Procedure:                Upper GI endoscopy Indications:              Heme positive stool Medicines:                Monitored Anesthesia Care Procedure:                Pre-Anesthesia Assessment:                           - Prior to the procedure, a History and Physical                            was performed, and patient medications and                            allergies were reviewed. The patient's tolerance of                            previous anesthesia was also reviewed. The risks                            and benefits of the procedure and the sedation                            options and risks were discussed with the patient.                            All questions were answered, and informed consent                            was obtained. Prior Anticoagulants: The patient has                            taken no previous anticoagulant or antiplatelet                            agents. ASA Grade Assessment: II - A patient with                            mild systemic disease. After reviewing the risks                            and benefits, the patient was deemed in                            satisfactory condition to undergo the procedure.                           After obtaining informed consent, the endoscope was  passed under direct vision. Throughout the                            procedure, the patient's blood pressure, pulse, and                            oxygen saturations were monitored continuously. The                            Endoscope was introduced through the mouth, and                            advanced to the second part of duodenum. The upper                            GI endoscopy was accomplished  without difficulty.                            The patient tolerated the procedure well. Scope In: Scope Out: Findings:                 The examined esophagus was normal.                           A medium-sized hiatal hernia was present.                           The exam of the stomach was otherwise normal.                           The duodenal bulb and second portion of the                            duodenum were normal. Complications:            No immediate complications. Estimated Blood Loss:     Estimated blood loss: none. Impression:               - Normal esophagus.                           - Medium-sized hiatal hernia.                           - Normal duodenal bulb and second portion of the                            duodenum.                           - No specimens collected. Recommendation:           - Patient has a contact number available for                            emergencies. The signs and symptoms of potential  delayed complications were discussed with the                            patient. Return to normal activities tomorrow.                            Written discharge instructions were provided to the                            patient.                           - Resume previous diet.                           - Continue present medications.                           - GI follow up as needed.                           - Return to your PCP for ongoing care. Ladene Artist, MD 10/23/2018 4:52:19 PM This report has been signed electronically.

## 2018-10-24 ENCOUNTER — Telehealth: Payer: Self-pay

## 2018-10-24 NOTE — Telephone Encounter (Signed)
  Follow up Call-  Call back number 10/23/2018  Post procedure Call Back phone  # [phone]  Permission to leave phone message Yes  Some recent data might be hidden     Patient questions:  Do you have a fever, pain , or abdominal swelling? No. Pain Score  0 *  Have you tolerated food without any problems? Yes.    Have you been able to return to your normal activities? Yes.    Do you have any questions about your discharge instructions: Diet   No. Medications  No. Follow up visit  No.  Do you have questions or concerns about your Care? No.  Actions: * If pain score is 4 or above: No action needed, pain <4.

## 2018-10-25 DIAGNOSIS — Z23 Encounter for immunization: Secondary | ICD-10-CM | POA: Diagnosis not present

## 2018-11-17 DIAGNOSIS — F064 Anxiety disorder due to known physiological condition: Secondary | ICD-10-CM | POA: Diagnosis not present

## 2018-12-05 ENCOUNTER — Other Ambulatory Visit: Payer: Self-pay | Admitting: Internal Medicine

## 2019-04-16 DIAGNOSIS — F411 Generalized anxiety disorder: Secondary | ICD-10-CM | POA: Diagnosis not present

## 2019-07-07 DIAGNOSIS — K429 Umbilical hernia without obstruction or gangrene: Secondary | ICD-10-CM | POA: Diagnosis not present

## 2019-07-07 DIAGNOSIS — K449 Diaphragmatic hernia without obstruction or gangrene: Secondary | ICD-10-CM | POA: Diagnosis not present

## 2019-07-07 DIAGNOSIS — R1084 Generalized abdominal pain: Secondary | ICD-10-CM | POA: Diagnosis not present

## 2019-07-07 DIAGNOSIS — E785 Hyperlipidemia, unspecified: Secondary | ICD-10-CM | POA: Diagnosis not present

## 2019-07-07 DIAGNOSIS — R112 Nausea with vomiting, unspecified: Secondary | ICD-10-CM | POA: Diagnosis not present

## 2019-07-07 DIAGNOSIS — N186 End stage renal disease: Secondary | ICD-10-CM | POA: Diagnosis not present

## 2019-07-07 DIAGNOSIS — R10819 Abdominal tenderness, unspecified site: Secondary | ICD-10-CM | POA: Diagnosis not present

## 2019-07-07 DIAGNOSIS — Z79899 Other long term (current) drug therapy: Secondary | ICD-10-CM | POA: Diagnosis not present

## 2019-07-07 DIAGNOSIS — Z86711 Personal history of pulmonary embolism: Secondary | ICD-10-CM | POA: Diagnosis not present

## 2019-07-07 DIAGNOSIS — N1330 Unspecified hydronephrosis: Secondary | ICD-10-CM | POA: Diagnosis not present

## 2019-07-07 DIAGNOSIS — I774 Celiac artery compression syndrome: Secondary | ICD-10-CM | POA: Diagnosis not present

## 2019-07-14 ENCOUNTER — Ambulatory Visit: Payer: Medicare Other | Admitting: Internal Medicine

## 2019-08-08 ENCOUNTER — Other Ambulatory Visit: Payer: Self-pay | Admitting: Internal Medicine

## 2019-08-10 ENCOUNTER — Other Ambulatory Visit: Payer: Self-pay | Admitting: Internal Medicine

## 2019-08-12 ENCOUNTER — Other Ambulatory Visit: Payer: Self-pay

## 2019-08-12 ENCOUNTER — Ambulatory Visit (INDEPENDENT_AMBULATORY_CARE_PROVIDER_SITE_OTHER): Payer: Medicare Other | Admitting: Gastroenterology

## 2019-08-12 ENCOUNTER — Encounter: Payer: Self-pay | Admitting: Gastroenterology

## 2019-08-12 VITALS — BP 112/62 | HR 98 | Temp 98.3°F | Ht 66.0 in | Wt 165.0 lb

## 2019-08-12 DIAGNOSIS — R1013 Epigastric pain: Secondary | ICD-10-CM

## 2019-08-12 DIAGNOSIS — K219 Gastro-esophageal reflux disease without esophagitis: Secondary | ICD-10-CM

## 2019-08-12 DIAGNOSIS — F064 Anxiety disorder due to known physiological condition: Secondary | ICD-10-CM | POA: Diagnosis not present

## 2019-08-12 MED ORDER — FAMOTIDINE 40 MG PO TABS: 40.0000 mg | ORAL_TABLET | Freq: Every day | ORAL | 11 refills | Status: DC

## 2019-08-12 NOTE — Progress Notes (Signed)
    History of Present Illness: This is an  85 year old female here for follow up after an ED evaluation on 07/07/2019 at [facility] in [place] for acute abdominal pain, abdominal bloating, nausea, vomiting.  She relates the acute onset of abdominal pain while visiting her daughter in [place].  This is associated with several meals and beverages that were very different than her usual routine of closely following antireflux measures.  Her acute symptoms resolved promptly and she only notes mild intermittent reflux symptoms currently.  I reviewed records from her ED evaluation at [facility] including CT scan, blood work and physician notes.   CT AP 07/07/2019 1. No acute inflammatory changes are seen within the abdomen or pelvis.  2. There is mild pelvocaliectasis seen bilaterally with no ureteral dilation and normal enhancement pattern.  3. There is diffuse atherosclerosis with narrowing at the origin of the celiac artery and mild narrowing at the origin of the SMA. However, there is no bowel wall thickening or pneumatosis to suggest acute bowel ischemia.  4. Small hiatal hernia.  5. Fecal loading. No evidence of obstruction.  Signed (Electronic Signature): 07/07/2019 8:39 AM Signed By: Noralyn Pick, MD  EGD 09/2018 - Normal esophagus. - Medium-sized hiatal hernia. - Normal duodenal bulb and second portion of the duodenum. - No specimens collected.  Colonoscopy 09/2018 - Diverticulosis in the sigmoid colon. - The examination was otherwise normal on direct and retroflexion views. - No specimens collected.   Current Medications, Allergies, Past Medical History, Past Surgical History, Family History and Social History were reviewed in [employer] record.   Physical Exam: General: Well developed, well nourished, elderly, no acute distress Head: Normocephalic and atraumatic Eyes:  sclerae anicteric, EOMI Ears: Normal auditory acuity Mouth: No deformity or lesions Lungs:  Clear throughout to auscultation Heart: Regular rate and rhythm; no murmurs, rubs or bruits Abdomen: Soft, non tender and non distended. No masses, hepatosplenomegaly or hernias noted. Normal Bowel sounds Rectal: Not done Musculoskeletal: Symmetrical with no gross deformities  Pulses:  Normal pulses noted Extremities: No clubbing, cyanosis, edema or deformities noted Neurological: Alert oriented x 4, grossly nonfocal Psychological:  Alert and cooperative. Normal mood and affect   Assessment and Recommendations:  1.  Acute abdominal pain requiring ED evaluation, resolve promptly.  Etiology unclear.  Possible GERD flare or gastroenteritis.  We reviewed CT AP findings and her recent colonoscopy and EGD findings.    2.  GERD and medium-sized hiatal hernia.  Advised to discontinue ranitidine as it has been removed from the market.  Begin famotidine 40 mg at bedtime.  Patient is advised to contact us if her reflux symptoms do not remain under good control.  Follow-up with PCP.  GI follow-up PRN.

## 2019-08-12 NOTE — Patient Instructions (Signed)
Patient advised to avoid spicy, acidic, citrus, chocolate, mints, fruit and fruit juices.  Limit the intake of caffeine, alcohol and Soda.  Don't exercise too soon after eating.  Don't lie down within 3-4 hours of eating.  Elevate the head of your bed.  We have sent the following medications to your pharmacy for you to pick up at your convenience: famotadine 40 mg 1 at bedtime  Return to the clinic as needed.  Thank you for choosing me and [facility].  Pricilla Riffle. Dagoberto Ligas., MD., Marval Regal

## 2019-08-25 NOTE — Progress Notes (Addendum)
Subjective:   Faith Edwards is a 85 y.o. female who presents for Medicare Annual (Subsequent) preventive examination. I connected with patient by a telephone and verified that I am speaking with the correct person using two identifiers. Patient stated full name and DOB. Patient gave permission to continue with telephonic visit. Patient's location was at home and Nurse's location was at [place] office.   Review of Systems:   Cardiac Risk Factors include: advanced age (>91men, >13 women);dyslipidemia Sleep patterns: gets up 1-3 times nightly to void and sleeps 4-6 hours nightly. Patient reports insomnia issues, discussed recommended sleep tips and stress reduction tips.   Home Safety/Smoke Alarms: Feels safe in home. Smoke alarms in place.  Living environment; residence and Firearm Safety: 2-story house, equipment: Radio producer, Type: [place]. Lives with husband, no needs for DME, good support system Seat Belt Safety/Bike Helmet: Wears seat belt.      Objective:     Vitals: There were no vitals taken for this visit.  There is no height or weight on file to calculate BMI.  Advanced Directives 07/18/2018 05/04/2017 09/06/2015 07/06/2015 10/10/2014  Does Patient Have a Medical Advance Directive? No No No No No  Would patient like information on creating a medical advance directive? Yes (ED - Information included in AVS) - - No - patient declined information No - patient declined information    Tobacco Social History   Tobacco Use  Smoking Status Never Smoker  Smokeless Tobacco Never Used     Counseling given: Not Answered  Past Medical History:  Diagnosis Date  . Anxiety   . Arthritis   . Asthma   . Bronchitis    hx of   . Chronic headaches    [facility]  . Cystitis    1954  . Depression   . Ear pain    bilat   . Hiatal hernia   . History of blood transfusion    2010  . Hx of blood clots 2007 & 2010   post TKR  . Hyperthyroidism   . Pelvic fracture (HCC)    hx of  in three places secondary to fall   . Pneumonia    hx of 1952  . Pulmonary embolism ([place]) 1993   on HRT  . Right wrist fracture    hx of 1995 has plate secondary to fall   . Shaking    hands bilat  . Shortness of breath dyspnea   . Skin cancer    basal cell   . Spinal stenosis   . Trauma    left lower leg 10/22/2014   . Urinary frequency   . Urticaria    Past Surgical History:  Procedure Laterality Date  . ABDOMINAL HYSTERECTOMY    . ADENOIDECTOMY    . BREAST ENHANCEMENT SURGERY  1985  . CHOLECYSTECTOMY    . COLONOSCOPY     diverticulosis  . ESI   11/2013    C7-T1 ; Dr Nelva Bush  . EYE SURGERY     bilat cataract surgery   . INSERTION OF MESH N/A 07/08/2015   Procedure: INSERTION OF MESH;  Surgeon: Armandina Gemma, MD;  Location: [facility];  Service: General;  Laterality: N/A;  . REPLACEMENT TOTAL KNEE BILATERAL  2007 & 2010  . SP FACET INJECTION  01/19/14   RC2-3,3-4,C4-5  . TONSILLECTOMY    . VENTRAL HERNIA REPAIR N/A 07/08/2015   Procedure: VENTRAL ADULT REPAIR VENTRAL INCISIONAL HERNIA REPAIR;  Surgeon: Armandina Gemma, MD;  Location: [facility];  Service: General;  Laterality: N/A;  . WRIST FRACTURE SURGERY Bilateral 10  years and 2 years/  . WRIST SURGERY Left 06/2016   plate    Family History  Problem Relation Age of Onset  . Allergies Brother   . Asthma Brother   . Heart disease Brother   . Clotting disorder Father        PTE X 2  . Skin cancer Father   . Skin cancer Mother   . Sudden death Other        2 M aunts & 2 M uncles in 48s  . Heart attack Brother        > 55  . Bladder Cancer Brother   . Diabetes Daughter   . Breast cancer Daughter   . Diabetes Son   . Mental illness Son        suicide  . Stroke Neg Hx    Social History   Socioeconomic History  . Marital status: Married    Spouse name: Not on file  . Number of children: 3  . Years of education: Not on file  . Highest education level: Not on file  Occupational History  . Occupation: retired  Photographer  . Financial resource strain: Not hard at all  . Food insecurity    Worry: Never true    Inability: Never true  . Transportation needs    Medical: No    Non-medical: No  Tobacco Use  . Smoking status: Never Smoker  . Smokeless tobacco: Never Used  Substance and Sexual Activity  . Alcohol use: Yes    Comment: minimal  . Drug use: No  . Sexual activity: Not Currently  Lifestyle  . Physical activity    Days per week: 3 days    Minutes per session: 30 min  . Stress: Only a little  Relationships  . Social connections    Talks on phone: More than three times a week    Gets together: More than three times a week    Attends religious service: More than 4 times per year    Active member of club or organization: Yes    Attends meetings of clubs or organizations: More than 4 times per year    Relationship status: Married  Other Topics Concern  . Not on file  Social History Narrative  . Not on file    Outpatient Encounter Medications as of 08/26/2019  Medication Sig  . albuterol (PROVENTIL HFA;VENTOLIN HFA) 108 (90 BASE) MCG/ACT inhaler Inhale 2 puffs into the lungs every 6 (six) hours as needed for wheezing or shortness of breath.  Marland Kitchen atorvastatin (LIPITOR) 20 MG tablet Take 20 mg by mouth daily.  . cholecalciferol (VITAMIN D) 1000 UNITS tablet Take 1,000 Units by mouth daily.  . Cyanocobalamin 1000 MCG/15ML LIQD Take 2,000 mcg by mouth every morning.   . DULoxetine (CYMBALTA) 30 MG capsule TK ONE C PO QD  . famotidine (PEPCID) 40 MG tablet Take 1 tablet (40 mg total) by mouth at bedtime.  . [DISCONTINUED] Fluticasone-Salmeterol (ADVAIR) 100-50 MCG/DOSE AEPB Inhale 1 puff into the lungs 2 (two) times daily.   No facility-administered encounter medications on file as of 08/26/2019.     Activities of Daily Living In your present state of health, do you have any difficulty performing the following activities: 08/26/2019  Hearing? N  Vision? N  Difficulty concentrating or  making decisions? N  Walking or climbing stairs? N  Dressing or bathing? N  Doing errands,  shopping? N  Preparing Food and eating ? N  Using the Toilet? N  In the past six months, have you accidently leaked urine? N  Do you have problems with loss of bowel control? N  Managing your Medications? N  Managing your Finances? N  Housekeeping or managing your Housekeeping? N  Some recent data might be hidden    Patient Care Team: Binnie Rail, MD as PCP - General (Internal Medicine) Tanda Rockers, MD as Consulting Physician (Pulmonary Disease) Marica Otter, [place] (Optometry) Norma Fredrickson, MD as Consulting Physician (Psychiatry) Philemon Kingdom, MD as Consulting Physician (Internal Medicine) Leatha Gilding, MD as Referring Physician (Orthopedic Surgery)    Assessment:   This is a routine wellness examination for Faith Edwards. Physical assessment deferred to PCP.  Exercise Activities and Dietary recommendations Current Exercise Habits: Home exercise routine, Type of exercise: walking, Time (Minutes): 40, Frequency (Times/Week): 5, Weekly Exercise (Minutes/Week): 200, Intensity: Mild, Exercise limited by: orthopedic condition(s) Diet (meal preparation, eat out, water intake, caffeinated beverages, dairy products, fruits and vegetables): in general, a "healthy" diet  , well balanced. eats a variety of fruits and vegetables daily, limits salt, fat/cholesterol, sugar,carbohydrates,caffeine, drinks 6-8 glasses of water daily.  Goals    . Maintain current health status     Continue to exercise and eat healthy    . Patient Stated     Stay as healthy and as independent as possible. Continue to eat healthy and be active physically and socially.       Fall Risk Fall Risk  08/26/2019 07/18/2018 05/07/2017 05/07/2017 03/15/2016  Falls in the past year? 0 No Yes No Yes  Number falls in past yr: 0 - 1 - 1  Injury with Fall? 0 - Yes - Yes  Risk Factor Category  - - High Fall Risk - -  Risk for fall  due to : - Impaired mobility;Impaired balance/gait;History of fall(s) Impaired balance/gait - -  Risk for fall due to: Comment - - recently finish PT to help improve ambulatory gait - -  Follow up Falls prevention discussed - Education provided;Falls prevention discussed - -    Depression Screen PHQ 2/9 Scores 08/26/2019 07/18/2018 05/07/2017 05/07/2017  PHQ - 2 Score 1 1 1  0  PHQ- 9 Score 3 7 4  0     Cognitive Function MMSE - Mini Mental State Exam 07/18/2018 05/07/2017  Orientation to time 5 3  Orientation to Place 5 5  Registration 3 3  Attention/ Calculation 5 5  Recall 0 3  Language- name 2 objects 2 2  Language- repeat 1 1  Language- follow 3 step command 3 3  Language- read & follow direction 1 1  Write a sentence 1 1  Copy design 1 1  Total score 27 28       Ad8 score reviewed for issues:  Issues making decisions: no  Less interest in hobbies / activities: no  Repeats questions, stories (family complaining): no  Trouble using ordinary gadgets (microwave, computer, phone):no  Forgets the month or year: no  Mismanaging finances: no  Remembering appts: no  Daily problems with thinking and/or memory: no Ad8 score is= 0  Immunization History  Administered Date(s) Administered  . Influenza Whole 10/28/2008, 09/28/2009, 10/08/2011  . Influenza, Seasonal, Injecte, Preservative Fre 11/03/2013  . Influenza,inj,Quad PF,6+ Mos 10/05/2014, 09/30/2015  . Influenza-Unspecified 09/25/2016, 09/09/2017  . Pneumococcal Conjugate-13 09/30/2015  . Pneumococcal Polysaccharide-23 01/07/2014  . Tdap 10/10/2014  . Zoster Recombinat (Shingrix) 07/05/2018, 09/09/2018   Screening Tests  Health Maintenance  Topic Date Due  . INFLUENZA VACCINE  08/01/2019  . TETANUS/TDAP  10/10/2024  . DEXA SCAN  Completed  . PNA vac Low Risk Adult  Completed      Plan:  Continue to eat heart healthy diet (full of fruits, vegetables, whole grains, lean protein, water--limit salt, fat, and sugar  intake) and increase physical activity as tolerated.  Continue doing brain stimulating activities (puzzles, reading, adult coloring books, staying active) to keep memory sharp.   Continue doing brain stimulating activities (puzzles, reading, adult coloring books, staying active) to keep memory sharp.   I have personally reviewed and noted the following in the patient's chart:   . Medical and social history . Use of alcohol, tobacco or illicit drugs  . Current medications and supplements . Functional ability and status . Nutritional status . Physical activity . Advanced directives . List of other physicians . Screenings to include cognitive, depression, and falls . Referrals and appointments  In addition, I have reviewed and discussed with patient certain preventive protocols, quality metrics, and best practice recommendations. A written personalized care plan for preventive services as well as general preventive health recommendations were provided to patient.     Michiel Cowboy, RN  08/26/2019    Medical screening examination/treatment/procedure(s) were performed by non-physician practitioner and as supervising physician I was immediately available for consultation/collaboration. I agree with above. Binnie Rail, MD

## 2019-08-26 ENCOUNTER — Ambulatory Visit (INDEPENDENT_AMBULATORY_CARE_PROVIDER_SITE_OTHER): Payer: Medicare Other | Admitting: *Deleted

## 2019-08-26 DIAGNOSIS — Z0000 Encounter for general adult medical examination without abnormal findings: Secondary | ICD-10-CM

## 2019-09-09 ENCOUNTER — Other Ambulatory Visit: Payer: Self-pay | Admitting: Internal Medicine

## 2019-09-24 DIAGNOSIS — Z23 Encounter for immunization: Secondary | ICD-10-CM | POA: Diagnosis not present

## 2019-10-12 NOTE — Progress Notes (Signed)
Subjective:    Patient ID: Faith Edwards, female    DOB: Dec 31, 1932, 85 y.o.   MRN: [id number]  HPI The patient is here for follow up.  She is exercising regularly -walking.    Allergic rhinitis:  She is having bad allergies.  She has fluid in her ears  Hyperlipidemia: She is taking her medication daily. She is compliant with a low fat/cholesterol diet. She denies myalgias.   Prediabetes:  She has not been compliant with a low sugar/carbohydrate diet.  She is exercising regularly.  CKD:  She does not take any nsaids.  She is drinking a lot of water daily.    Depression: She is taking her medication daily as prescribed. She denies any side effects from the medication. She feels her depression is well controlled and she is happy with her current dose of medication.   Osteopenia:  Her last dexa was 2016.  She is taking vitamin d daily. She is walking regularly.      Medications and allergies reviewed with patient and updated if appropriate.  Patient Active Problem List   Diagnosis Date Noted  . Allergic rhinitis 10/13/2019  . Multiple lung nodules on CT 07/29/2018  . Other chest pain 07/28/2018  . Prediabetes 05/09/2017  . CKD (chronic kidney disease) stage 3, GFR 30-59 ml/min 11/08/2016  . Bursitis of right knee 08/07/2016  . Herpes zoster 06/02/2016  . Upper airway cough syndrome 06/01/2016  . Hyperlipidemia 03/22/2016  . Choking 03/15/2016  . GERD (gastroesophageal reflux disease) 03/15/2016  . Graves disease 01/04/2016  . Osteopenia, severe 10/14/2015  . Other constipation 09/01/2015  . Incisional hernia 07/08/2015  . Ventral hernia without obstruction or gangrene 05/12/2015  . Lower back pain 05/12/2015  . Spinal stenosis in cervical region 11/26/2013  . Cough 03/07/2012  . Depression 08/05/2008  . Personal history of venous thrombosis and embolism 08/05/2008  . Chronic rhinitis 12/29/2007  . Asthma 12/29/2007    Current Outpatient Medications on File Prior  to Visit  Medication Sig Dispense Refill  . aspirin EC 81 MG tablet Take 81 mg by mouth daily.    Marland Kitchen atorvastatin (LIPITOR) 20 MG tablet Take 20 mg by mouth daily.    Marland Kitchen azelastine (ASTELIN) 0.1 % nasal spray U 2 SPRAYS IEN BID    . cholecalciferol (VITAMIN D) 1000 UNITS tablet Take 1,000 Units by mouth daily.    . Cyanocobalamin 1000 MCG/15ML LIQD Take 2,000 mcg by mouth every morning.     . DULoxetine (CYMBALTA) 30 MG capsule TK ONE C PO QD  4  . famotidine (PEPCID) 40 MG tablet Take 1 tablet (40 mg total) by mouth at bedtime. 30 tablet 11  . loratadine (CLARITIN) 10 MG tablet Take 10 mg by mouth daily.    . Melatonin 10 MG TABS Take by mouth.    . [DISCONTINUED] Fluticasone-Salmeterol (ADVAIR) 100-50 MCG/DOSE AEPB Inhale 1 puff into the lungs 2 (two) times daily.     No current facility-administered medications on file prior to visit.     Past Medical History:  Diagnosis Date  . Anxiety   . Arthritis   . Asthma   . Bronchitis    hx of   . Chronic headaches    [facility]  . Cystitis    1954  . Depression   . Ear pain    bilat   . Hiatal hernia   . History of blood transfusion    2010  . Hx of blood clots 2007 &  2010   post TKR  . Hyperthyroidism   . Pelvic fracture (HCC)    hx of in three places secondary to fall   . Pneumonia    hx of 1952  . Pulmonary embolism ([place]) 1993   on HRT  . Right wrist fracture    hx of 1995 has plate secondary to fall   . Shaking    hands bilat  . Shortness of breath dyspnea   . Skin cancer    basal cell   . Spinal stenosis   . Trauma    left lower leg 10/22/2014   . Urinary frequency   . Urticaria     Past Surgical History:  Procedure Laterality Date  . ABDOMINAL HYSTERECTOMY    . ADENOIDECTOMY    . BREAST ENHANCEMENT SURGERY  1985  . CHOLECYSTECTOMY    . COLONOSCOPY     diverticulosis  . ESI   11/2013    C7-T1 ; Dr Nelva Bush  . EYE SURGERY     bilat cataract surgery   . INSERTION OF MESH N/A 07/08/2015   Procedure:  INSERTION OF MESH;  Surgeon: Armandina Gemma, MD;  Location: [facility];  Service: General;  Laterality: N/A;  . REPLACEMENT TOTAL KNEE BILATERAL  2007 & 2010  . SP FACET INJECTION  01/19/14   RC2-3,3-4,C4-5  . TONSILLECTOMY    . VENTRAL HERNIA REPAIR N/A 07/08/2015   Procedure: VENTRAL ADULT REPAIR VENTRAL INCISIONAL HERNIA REPAIR;  Surgeon: Armandina Gemma, MD;  Location: [facility];  Service: General;  Laterality: N/A;  . WRIST FRACTURE SURGERY Bilateral 10  years and 2 years/  . WRIST SURGERY Left 06/2016   plate     Social History   Socioeconomic History  . Marital status: Married    Spouse name: Not on file  . Number of children: 3  . Years of education: Not on file  . Highest education level: Not on file  Occupational History  . Occupation: retired  Scientific laboratory technician  . Financial resource strain: Not hard at all  . Food insecurity    Worry: Never true    Inability: Never true  . Transportation needs    Medical: No    Non-medical: No  Tobacco Use  . Smoking status: Never Smoker  . Smokeless tobacco: Never Used  Substance and Sexual Activity  . Alcohol use: Yes    Comment: minimal  . Drug use: No  . Sexual activity: Not Currently  Lifestyle  . Physical activity    Days per week: 5 days    Minutes per session: 30 min  . Stress: Only a little  Relationships  . Social connections    Talks on phone: More than three times a week    Gets together: More than three times a week    Attends religious service: More than 4 times per year    Active member of club or organization: Yes    Attends meetings of clubs or organizations: More than 4 times per year    Relationship status: Married  Other Topics Concern  . Not on file  Social History Narrative  . Not on file    Family History  Problem Relation Age of Onset  . Allergies Brother   . Asthma Brother   . Heart disease Brother   . Clotting disorder Father        PTE X 2  . Skin cancer Father   . Skin cancer Mother   . Sudden death  Other  2 M aunts & 2 M uncles in 49s  . Heart attack Brother        > 55  . Bladder Cancer Brother   . Diabetes Daughter   . Breast cancer Daughter   . Diabetes Son   . Mental illness Son        suicide  . Stroke Neg Hx     Review of Systems  Constitutional: Negative for chills and fever.  Respiratory: Positive for cough (chronic) and wheezing (asthma/allergies). Negative for shortness of breath.   Cardiovascular: Positive for leg swelling (mild R ankle). Negative for chest pain and palpitations.  Neurological: Positive for light-headedness (related to allergies). Negative for headaches.       Objective:   Vitals:   10/13/19 1329  BP: 118/70  Pulse: 86  Resp: 16  Temp: 98.4 F (36.9 C)  SpO2: 96%   BP Readings from Last 3 Encounters:  10/13/19 118/70  08/12/19 112/62  10/23/18 134/76   Wt Readings from Last 3 Encounters:  10/13/19 168 lb (76.2 kg)  08/12/19 165 lb (74.8 kg)  10/23/18 161 lb (73 kg)   Body mass index is 27.12 kg/m.   Physical Exam    Constitutional: Appears well-developed and well-nourished. No distress.  HENT:  Head: Normocephalic and atraumatic.  Neck: Neck supple. No tracheal deviation present. No thyromegaly present.  No cervical lymphadenopathy Cardiovascular: Normal rate, regular rhythm and normal heart sounds.   No murmur heard. No carotid bruit .  No edema Pulmonary/Chest: Effort normal and breath sounds normal. No respiratory distress. No has no wheezes. No rales.  Skin: Skin is warm and dry. Not diaphoretic.  Psychiatric: Normal mood and affect. Behavior is normal.      Assessment & Plan:    See Problem List for Assessment and Plan of chronic medical problems.

## 2019-10-12 NOTE — Patient Instructions (Addendum)
  Tests ordered today. Your results will be released to [place] (or called to you) after review.  If any changes need to be made, you will be notified at that same time.   Medications reviewed and updated.  Changes include :  none   A referral was ordered for an allergist.      Please followup in 1 year

## 2019-10-13 ENCOUNTER — Other Ambulatory Visit: Payer: Self-pay

## 2019-10-13 ENCOUNTER — Other Ambulatory Visit (INDEPENDENT_AMBULATORY_CARE_PROVIDER_SITE_OTHER): Payer: Medicare Other

## 2019-10-13 ENCOUNTER — Ambulatory Visit (INDEPENDENT_AMBULATORY_CARE_PROVIDER_SITE_OTHER): Payer: Medicare Other | Admitting: Internal Medicine

## 2019-10-13 ENCOUNTER — Encounter: Payer: Self-pay | Admitting: Internal Medicine

## 2019-10-13 VITALS — BP 118/70 | HR 86 | Temp 98.4°F | Resp 16 | Ht 66.0 in | Wt 168.0 lb

## 2019-10-13 DIAGNOSIS — K219 Gastro-esophageal reflux disease without esophagitis: Secondary | ICD-10-CM | POA: Diagnosis not present

## 2019-10-13 DIAGNOSIS — J309 Allergic rhinitis, unspecified: Secondary | ICD-10-CM | POA: Diagnosis not present

## 2019-10-13 DIAGNOSIS — E7849 Other hyperlipidemia: Secondary | ICD-10-CM

## 2019-10-13 DIAGNOSIS — F3289 Other specified depressive episodes: Secondary | ICD-10-CM

## 2019-10-13 DIAGNOSIS — N1831 Chronic kidney disease, stage 3a: Secondary | ICD-10-CM

## 2019-10-13 DIAGNOSIS — R7303 Prediabetes: Secondary | ICD-10-CM | POA: Diagnosis not present

## 2019-10-13 DIAGNOSIS — M8589 Other specified disorders of bone density and structure, multiple sites: Secondary | ICD-10-CM | POA: Diagnosis not present

## 2019-10-13 LAB — COMPREHENSIVE METABOLIC PANEL
ALT: 15 U/L (ref 0–35)
AST: 19 U/L (ref 0–37)
Albumin: 4.3 g/dL (ref 3.5–5.2)
Alkaline Phosphatase: 103 U/L (ref 39–117)
BUN: 28 mg/dL — ABNORMAL HIGH (ref 6–23)
CO2: 29 mEq/L (ref 19–32)
Calcium: 9.7 mg/dL (ref 8.4–10.5)
Chloride: 103 mEq/L (ref 96–112)
Creatinine, Ser: 1.27 mg/dL — ABNORMAL HIGH (ref 0.40–1.20)
GFR: 39.93 mL/min — ABNORMAL LOW (ref >60.00)
Glucose, Bld: 93 mg/dL (ref 70–99)
Potassium: 4.8 mEq/L (ref 3.5–5.1)
Sodium: 139 mEq/L (ref 135–145)
Total Bilirubin: 0.8 mg/dL (ref 0.2–1.2)
Total Protein: 7 g/dL (ref 6.0–8.3)

## 2019-10-13 LAB — CBC WITH DIFFERENTIAL/PLATELET
Basophils Absolute: 0.1 10*3/uL (ref 0.0–0.1)
Basophils Relative: 1 % (ref 0.0–3.0)
Eosinophils Absolute: 0.1 10*3/uL (ref 0.0–0.7)
Eosinophils Relative: 1.5 % (ref 0.0–5.0)
HCT: 39.6 % (ref 36.0–46.0)
Hemoglobin: 13.1 g/dL (ref 12.0–15.0)
Lymphocytes Relative: 30.1 % (ref 12.0–46.0)
Lymphs Abs: 2.2 10*3/uL (ref 0.7–4.0)
MCHC: 33 g/dL (ref 30.0–36.0)
MCV: 95.9 fl (ref 78.0–100.0)
Monocytes Absolute: 1 10*3/uL (ref 0.1–1.0)
Monocytes Relative: 13.1 % — ABNORMAL HIGH (ref 3.0–12.0)
Neutro Abs: 4 10*3/uL (ref 1.4–7.7)
Neutrophils Relative %: 54.3 % (ref 43.0–77.0)
Platelets: 283 10*3/uL (ref 150.0–400.0)
RBC: 4.13 Mil/uL (ref 3.87–5.11)
RDW: 13.1 % (ref 11.5–15.5)
WBC: 7.4 10*3/uL (ref 4.0–10.5)

## 2019-10-13 LAB — LIPID PANEL
Cholesterol: 181 mg/dL (ref 0–200)
HDL: 64.6 mg/dL (ref >39.00)
LDL Cholesterol: 102 mg/dL — ABNORMAL HIGH (ref 0–99)
NonHDL: 116.75
Total CHOL/HDL Ratio: 3
Triglycerides: 76 mg/dL (ref 0.0–149.0)
VLDL: 15.2 mg/dL (ref 0.0–40.0)

## 2019-10-13 LAB — HEMOGLOBIN A1C: Hgb A1c MFr Bld: 5.8 % (ref 4.6–6.5)

## 2019-10-13 LAB — TSH: TSH: 4.18 u[IU]/mL (ref 0.35–4.50)

## 2019-10-13 MED ORDER — ATORVASTATIN CALCIUM 20 MG PO TABS: 20.0000 mg | ORAL_TABLET | Freq: Every day | ORAL | 3 refills | Status: DC

## 2019-10-13 NOTE — Assessment & Plan Note (Addendum)
Check lipid panel,cmp, tsh  Continue daily statin - out of it for 3 days Regular exercise and healthy diet encouraged

## 2019-10-13 NOTE — Assessment & Plan Note (Signed)
Not controlled Would like to see an allergist - will refer

## 2019-10-13 NOTE — Assessment & Plan Note (Signed)
dexa due - ordered Walking daily Taking vitamin d

## 2019-10-13 NOTE — Assessment & Plan Note (Signed)
cmp Drinks plenty of water

## 2019-10-13 NOTE — Assessment & Plan Note (Signed)
Managed by Dr Casimiro Needle Controlled, stable Continue current dose of medication

## 2019-10-13 NOTE — Assessment & Plan Note (Signed)
GERD controlled Continue daily medication  

## 2019-10-13 NOTE — Assessment & Plan Note (Signed)
Check a1c Low sugar / carb diet Stressed regular exercise   

## 2019-10-14 ENCOUNTER — Other Ambulatory Visit: Payer: Self-pay

## 2019-10-14 ENCOUNTER — Ambulatory Visit (INDEPENDENT_AMBULATORY_CARE_PROVIDER_SITE_OTHER)
Admission: RE | Admit: 2019-10-14 | Discharge: 2019-10-14 | Disposition: A | Discharge status: Home / Self Care | Payer: Medicare Other | Source: Ambulatory Visit | Attending: Internal Medicine | Admitting: Internal Medicine

## 2019-10-14 DIAGNOSIS — M8589 Other specified disorders of bone density and structure, multiple sites: Secondary | ICD-10-CM

## 2019-10-14 DIAGNOSIS — N1831 Chronic kidney disease, stage 3a: Secondary | ICD-10-CM

## 2019-10-20 DIAGNOSIS — H903 Sensorineural hearing loss, bilateral: Secondary | ICD-10-CM | POA: Diagnosis not present

## 2019-10-20 DIAGNOSIS — Z7289 Other problems related to lifestyle: Secondary | ICD-10-CM | POA: Diagnosis not present

## 2019-10-20 DIAGNOSIS — H6121 Impacted cerumen, right ear: Secondary | ICD-10-CM | POA: Diagnosis not present

## 2019-10-21 ENCOUNTER — Other Ambulatory Visit (INDEPENDENT_AMBULATORY_CARE_PROVIDER_SITE_OTHER): Payer: Medicare Other

## 2019-10-21 DIAGNOSIS — N1831 Chronic kidney disease, stage 3a: Secondary | ICD-10-CM

## 2019-10-21 LAB — BASIC METABOLIC PANEL
BUN: 21 mg/dL (ref 6–23)
CO2: 27 mEq/L (ref 19–32)
Calcium: 9.1 mg/dL (ref 8.4–10.5)
Chloride: 104 mEq/L (ref 96–112)
Creatinine, Ser: 1.02 mg/dL (ref 0.40–1.20)
GFR: 51.41 mL/min — ABNORMAL LOW (ref >60.00)
Glucose, Bld: 101 mg/dL — ABNORMAL HIGH (ref 70–99)
Potassium: 4.2 mEq/L (ref 3.5–5.1)
Sodium: 139 mEq/L (ref 135–145)

## 2019-10-22 ENCOUNTER — Encounter: Payer: Self-pay | Admitting: Internal Medicine

## 2019-11-03 DIAGNOSIS — Z6827 Body mass index (BMI) 27.0-27.9, adult: Secondary | ICD-10-CM | POA: Diagnosis not present

## 2019-11-03 DIAGNOSIS — Z20828 Contact with and (suspected) exposure to other viral communicable diseases: Secondary | ICD-10-CM | POA: Diagnosis not present

## 2019-11-19 ENCOUNTER — Ambulatory Visit (INDEPENDENT_AMBULATORY_CARE_PROVIDER_SITE_OTHER): Payer: Medicare Other | Admitting: Internal Medicine

## 2019-11-19 ENCOUNTER — Encounter: Payer: Self-pay | Admitting: Internal Medicine

## 2019-11-19 ENCOUNTER — Other Ambulatory Visit: Payer: Self-pay

## 2019-11-19 DIAGNOSIS — R05 Cough: Secondary | ICD-10-CM | POA: Diagnosis not present

## 2019-11-19 DIAGNOSIS — R058 Other specified cough: Secondary | ICD-10-CM

## 2019-11-19 NOTE — Assessment & Plan Note (Signed)
Recurrent mid may 2017 off all gerd rx and again Jan 2019 off gerd maint rx > resumed 08/08/2018  -  07/07/19 severe gerd to point of gag/vomit >  Ct abd neg x small HH - 11/19/2019 televist > restart  1st gen H1 blockers per guidelines  / max gerd rx/ ov in 2 weeks with all meds   Advised: The standardized cough guidelines published in Chest by Lissa Morales in 2006 are still the best available and consist of a multiple step process (up to 12!) , not a single office visit,  and are intended  to address this problem logically,  with an alogrithm dependent on response to empiric treatment at  each progressive step  to determine a specific diagnosis with  minimal addtional testing needed. Therefore if adherence is an issue or can't be accurately verified,  it's very unlikely the standard evaluation and treatment will be successful here.    Furthermore, response to therapy (other than acute cough suppression, which should only be used short term with avoidance of narcotic containing cough syrups if possible), can be a gradual process for which the patient is not likely to  perceive immediate benefit.  Unlike going to an eye doctor where the best perscription is almost always the first one and is immediately effective, this is almost never the case in the management of chronic cough syndromes. Therefore the patient needs to commit up front to consistently adhere to recommendations  for up to 6 weeks of therapy directed at the likely underlying problem(s) before the response can be reasonably evaluated.    >>> d/c clariton/ replace with 1st gen H1 blockers per guidelines  But take at least one at supper and 2 at hs if tol to eliminate sense of pnds then consider gabapentin trial on return   Each maintenance medication was reviewed in detail including most importantly the difference between maintenance and as needed and under what circumstances the prns are to be used.  Please see AVS for specific  Instructions  which are unique to this visit and I personally typed out  which were reviewed in detail over the phone with the patient and a copy provided by mail.

## 2019-11-19 NOTE — Progress Notes (Signed)
Subjective:    Patient ID: Faith Edwards, female    DOB: [birth year] .   MRN: [id number]  Brief patient profile:  54 yowf never smoker  eval 2013 for cough variant asthma better on qvar/gerd rx but did not stay on any maint rx just occasional saba with specific exposures maybe twice a month and did fine off gerd rx  until Mid may 2017 acute onset uri/sinus infection/ cough / r lat pleuritic cp   > [facility] eval rx shot/prednisone/zpak> not better so referred to pulmonary clinic 05/31/2016 by Dr Billey Gosling.   History of Present Illness  05/31/2016  Pulmonary consultation/ office visit/ Faith Edwards  Only rx = saba prn but hfa poor  Chief Complaint  Patient presents with  . Pulmonary Consult    Referred by Dr. Quay Burow; chest congestion, coughing up cloudy mucus.  chest tightness.  discuss air travel, precautions for blood clots; ribs are sore from coughing  hx as above, more coughing issues than sob/  worse cp with coughing fits - the pain has improved despite the coughing continuing, never started back on gerd rx as prev rec Hx indicates c/o choking and dysphagia for months prior to onset of cough  - saw GI PA 03/29/16 with DgEs pos only for [facility] and was instructed to stop H1 at this point. rec  Only use your albuterol (ventolin) as a rescue medication  Try prilosec otc 20mg   Take 30-60 min before first meal of the day and Pepcid ac (famotidine) 20 mg one @  bedtime until cough is completely gone for at least a week without the need for cough suppression GERD .diet  If not all better in 2 weeks return to clinic  > did not return    Allergy eval [facility] 08/07/18  Spirometry: FEV1: 1.52L 80%, FVC: 1.87L 73%, ratio consistent with nonobstructive pattern  Allergy testing: Environmental allergy skin prick testing is positive to [place], sheep Sorrell, Phoma betae and cockroach.   allergy testing results were read and interpreted by provider, documented by clinical staff. Positive to grass pollen, weed pollen,  mold, and cockroach.  Imp/Rec:  Chronic Cough due to allergic rhinitis   - multifactorial likely due to post-nasal drainage and reflux   - environmental allergy skin testing today is positive grass pollen, weed pollen, mold and cockroach.  Allergen avoidance measures discussed and handouts provided   - for post-nasal drainage recommend use of nasal antihistamine, Astelin 1-2 sprays twice a day   - continue your Allegra daily   - take your ranitidine 150mg  twice a day to control reflux (you could also have silent reflux which may not present with heartburn symptoms)     - have access to albuterol inhaler 2 puffs every 4-6 hours as needed for cough/wheeze/shortness of breath/chest tightness.  May use 15-20 minutes prior to activity.   Monitor frequency of use.         08/08/2018  Acute extended ov/Deshon Hsiao re: re-establish re cough/sob recurrent/ abn ct chest  Chief Complaint  Patient presents with  . Acute Visit    Pt c/o increased SOB, cough and chest discomfort since Jan 2019. She had CTA on 07/29/18- no PE but nodules present.    Dyspnea:  Ok unless coughing but fatigued all the time  Cough: dry day > noct, non-productive, indolent onset Jan 2019 persistent daily  Developed off gerd rx  Sleeping: disturbed by coughing  SABA use: not using / no better on advair or saba  02: none  gen anterior chest pain with coughing only  > CTa 07/29/18 >>No evidence of pulmonary emboli. Tiny nodules in the right lung along the major fissure measuring less than 5 mm. No follow-up needed if patient is low-risk (and has no known or suspected primary neoplasm).   rec Try prilosec otc 20mg   Take 30-60 min before first meal of the day and Ranitidine to where you take it after supper and at bedtime  until cough is completely gone for at least a week without the need for cough suppression GERD  Diet   For drainage / throat tickle try take CHLORPHENIRAMINE  4 mg - take one every 4 hours as needed - available  over the counter- may cause drowsiness so start with just a bedtime dose or two and see how you tolerate it before trying in daytime   For cough > delsym 2 tsp every 12 hours as needed  If not better after you complete you allergy work up and treatment then return here with all medications in hand   07/07/2019 ER [facility] for severe reflux to point of gag/vomit  CT abd small hernia  Virtual Visit via Telephone Note 11/19/2019   I connected with Faith Edwards on 11/19/19 at   815  EST by telephone and verified that I am speaking with the correct person using two identifiers.   I discussed the limitations, risks, security and privacy concerns of performing an evaluation and management service by telephone and the availability of in person appointments. I also discussed with the patient that there may be a patient responsible charge related to this service. The patient expressed understanding and agreed to proceed.   History of Present Illness: cough since Jan 2019 Maint pepcid hs plus statin plus cymbalta and clariton Dyspnea:  2 miles a day nl pace  Cough: worst supper some better hs / dry hack assoc with sense of pnds but did not mention this to [facility] at eval l10/20/2020 Sleeping: on side  SABA use: none      No obvious day to day or daytime variability or assoc excess/ purulent sputum or mucus plugs or hemoptysis or cp or chest tightness, subjective wheeze or overt sinus or hb symptoms.    Also denies any obvious fluctuation of symptoms with weather or environmental changes or other aggravating or alleviating factors except as outlined above.   Meds reviewed/ med reconciliation completed         Observations/Objective: Sounds fine with the exception one coughing fit/ min hoarse   Assessment and Plan: See problem list for active a/p's   Follow Up Instructions: See avs for instructions unique to this ov which includes revised/ updated med list     I discussed the assessment  and treatment plan with the patient. The patient was provided an opportunity to ask questions and all were answered. The patient agreed with the plan and demonstrated an understanding of the instructions.   The patient was advised to call back or seek an in-person evaluation if the symptoms worsen or if the condition fails to improve as anticipated.  I provided 25 minutes of non-face-to-face time during this encounter.   Christinia Gully, MD          .

## 2019-11-19 NOTE — Patient Instructions (Addendum)
Stop clariton   For drainage / throat tickle try take CHLORPHENIRAMINE  4 mg  (Chlortab 4mg  (bright)  at [employer] should be easiest to find in the green box)  take one every 4 hours as needed -  Start with with one with supper and two at bedtime   Add Prilosec 20 mg  Take 30-60 min before first meal of the day and continue to take pepcid 40 mg at supper   GERD (REFLUX)  is an extremely common cause of respiratory symptoms just like yours , many times with no obvious heartburn at all.    It can be treated with medication, but also with lifestyle changes including elevation of the head of your bed (ideally with 6 -8inch blocks under the headboard of your bed),  Smoking cessation, avoidance of late meals, excessive alcohol, and avoid fatty foods, chocolate, peppermint, colas, red wine, and acidic juices such as orange juice.  NO MINT OR MENTHOL PRODUCTS SO NO COUGH DROPS  USE SUGARLESS CANDY INSTEAD (Jolley ranchers or Stover's or Life Savers) or even ice chips will also do - the key is to swallow to prevent all throat clearing. NO OIL BASED VITAMINS - use powdered substitutes.  Avoid fish oil when coughing.    Please schedule a follow up office visit in 2 weeks, sooner if needed  with all medications /inhalers/ solutions in hand so we can verify exactly what you are taking. This includes all medications from all doctors and over the counters   Pt needs avs mailed.

## 2020-01-27 ENCOUNTER — Ambulatory Visit: Payer: Medicare Other

## 2020-02-01 DIAGNOSIS — S92354A Nondisplaced fracture of fifth metatarsal bone, right foot, initial encounter for closed fracture: Secondary | ICD-10-CM | POA: Diagnosis not present

## 2020-02-01 DIAGNOSIS — M79671 Pain in right foot: Secondary | ICD-10-CM | POA: Diagnosis not present

## 2020-02-02 ENCOUNTER — Ambulatory Visit (INDEPENDENT_AMBULATORY_CARE_PROVIDER_SITE_OTHER): Payer: Medicare Other | Admitting: Internal Medicine

## 2020-02-02 ENCOUNTER — Encounter: Payer: Self-pay | Admitting: Internal Medicine

## 2020-02-02 DIAGNOSIS — M8589 Other specified disorders of bone density and structure, multiple sites: Secondary | ICD-10-CM | POA: Diagnosis not present

## 2020-02-02 NOTE — Progress Notes (Addendum)
Virtual Visit via telephone note  I connected with Faith Edwards on 02/02/20 at  3:15 PM EST by telephone and verified that I am speaking with the correct person using two identifiers.   I discussed the limitations of evaluation and management by telemedicine and the availability of in person appointments. The patient expressed understanding and agreed to proceed.  Present for the visit:  Myself, Dr Billey Gosling, [facility].  The patient is currently at home and I am in the office.    No referring provider.    History of Present Illness: This is a follow-up visit to discuss her last bone density.  Her bone density showed severe osteopenia with high FRAX.  I did inform her that she would benefit from medication.  Couple of days ago she did break a small bone in her foot and realizes that she needs to go on medication.  Years ago she was on an oral medication.  She does have some reflux and a hiatal hernia and is currently following with GI.  She is taking medication and her symptoms are controlled.  She typically walks regularly for exercise, but in the past few days has not been able to and will not be able to walk until her foot heals, but she will get back to walking as soon as she is able.  She is taking 2000 units of vitamin D daily.    Social History   Socioeconomic History  . Marital status: Married    Spouse name: Not on file  . Number of children: 3  . Years of education: Not on file  . Highest education level: Not on file  Occupational History  . Occupation: retired  Tobacco Use  . Smoking status: Never Smoker  . Smokeless tobacco: Never Used  Substance and Sexual Activity  . Alcohol use: Yes    Comment: minimal  . Drug use: No  . Sexual activity: Not Currently  Other Topics Concern  . Not on file  Social History Narrative  . Not on file   Social Determinants of Health   Financial Resource Strain:   . Difficulty of Paying Living Expenses: Not on file  Food  Insecurity:   . Worried About Charity fundraiser in the Last Year: Not on file  . Ran Out of Food in the Last Year: Not on file  Transportation Needs:   . Lack of Transportation (Medical): Not on file  . Lack of Transportation (Non-Medical): Not on file  Physical Activity: Unknown  . Days of Exercise per Week: 5 days  . Minutes of Exercise per Session: Not on file  Stress:   . Feeling of Stress : Not on file  Social Connections:   . Frequency of Communication with Friends and Family: Not on file  . Frequency of Social Gatherings with Friends and Family: Not on file  . Attends Religious Services: Not on file  . Active Member of Clubs or Organizations: Not on file  . Attends Archivist Meetings: Not on file  . Marital Status: Not on file        Assessment and Plan:  Reviewed bone density.  She would benefit from medication.  Given her gastrointestinal history and slightly decreased kidney function at one point that was moderately decreased we both agree that Prolia would be her best choice.  We will look into cost and then she can make a final decision.  Discussed regular exercise-walking when she is able.  Continue vitamin  D.  All of her questions were answered.  See Problem List for Assessment and Plan of chronic medical problems.   Follow Up Instructions:    I discussed the assessment and treatment plan with the patient. The patient was provided an opportunity to ask questions and all were answered. The patient agreed with the plan and demonstrated an understanding of the instructions.   The patient was advised to call back or seek an in-person evaluation if the symptoms worsen or if the condition fails to improve as anticipated.  Time spent on telephone call: 13 minutes  Binnie Rail, MD

## 2020-02-03 ENCOUNTER — Telehealth: Payer: Self-pay | Admitting: Internal Medicine

## 2020-02-03 NOTE — Telephone Encounter (Signed)
Has been submitted for verification. Will contact patient to schedule when benefits have been received.

## 2020-02-03 NOTE — Telephone Encounter (Signed)
    Patient called, states she wants the Porlia injection no matter the cost.

## 2020-02-05 ENCOUNTER — Ambulatory Visit: Payer: Medicare Other

## 2020-02-12 ENCOUNTER — Telehealth: Payer: Self-pay

## 2020-02-12 NOTE — Telephone Encounter (Signed)
New message    The patient need the nurse to call her regarding a shot for her bones to increase strength.

## 2020-02-12 NOTE — Telephone Encounter (Signed)
It looks like the insurance process was done on 02/03/20. Have you received any info for this by chance?

## 2020-02-12 NOTE — Telephone Encounter (Signed)
I have submitted her insurance for verification. I will call her to schedule as soon as the benefits are back.

## 2020-02-15 DIAGNOSIS — S92354A Nondisplaced fracture of fifth metatarsal bone, right foot, initial encounter for closed fracture: Secondary | ICD-10-CM | POA: Diagnosis not present

## 2020-02-15 DIAGNOSIS — M79671 Pain in right foot: Secondary | ICD-10-CM | POA: Diagnosis not present

## 2020-03-14 DIAGNOSIS — M79671 Pain in right foot: Secondary | ICD-10-CM | POA: Diagnosis not present

## 2020-03-14 DIAGNOSIS — S92354D Nondisplaced fracture of fifth metatarsal bone, right foot, subsequent encounter for fracture with routine healing: Secondary | ICD-10-CM | POA: Diagnosis not present

## 2020-03-17 ENCOUNTER — Telehealth: Payer: Self-pay | Admitting: Internal Medicine

## 2020-03-17 DIAGNOSIS — K219 Gastro-esophageal reflux disease without esophagitis: Secondary | ICD-10-CM | POA: Diagnosis not present

## 2020-03-17 DIAGNOSIS — R05 Cough: Secondary | ICD-10-CM | POA: Diagnosis not present

## 2020-03-17 DIAGNOSIS — J3089 Other allergic rhinitis: Secondary | ICD-10-CM | POA: Diagnosis not present

## 2020-03-17 NOTE — Telephone Encounter (Signed)
Confirmed coverage.  Appointment has been scheduled.

## 2020-03-17 NOTE — Telephone Encounter (Signed)
New message:   Pt is calling and states she is waiting on a response about getting the Prolia shot. She states she has some paperwork that she received but can't fill them out until she gets a confirmation date for the shot.

## 2020-03-17 NOTE — Telephone Encounter (Signed)
Have you gotten any info yet?

## 2020-03-18 ENCOUNTER — Other Ambulatory Visit: Payer: Self-pay

## 2020-03-18 ENCOUNTER — Ambulatory Visit (INDEPENDENT_AMBULATORY_CARE_PROVIDER_SITE_OTHER): Payer: Medicare Other

## 2020-03-18 DIAGNOSIS — M8589 Other specified disorders of bone density and structure, multiple sites: Secondary | ICD-10-CM | POA: Diagnosis not present

## 2020-03-18 DIAGNOSIS — M8580 Other specified disorders of bone density and structure, unspecified site: Secondary | ICD-10-CM | POA: Diagnosis not present

## 2020-03-18 MED ORDER — DENOSUMAB 60 MG/ML ~~LOC~~ SOSY
60.0000 mg | PREFILLED_SYRINGE | Freq: Once | SUBCUTANEOUS | Status: AC
  Administered 2020-03-18: 60 mg via SUBCUTANEOUS

## 2020-03-18 NOTE — Progress Notes (Signed)
prolia Injection given.   Faith Edwards J Faith Purnell, MD  

## 2020-03-29 DIAGNOSIS — F3181 Bipolar II disorder: Secondary | ICD-10-CM | POA: Diagnosis not present

## 2020-04-01 DIAGNOSIS — D1801 Hemangioma of skin and subcutaneous tissue: Secondary | ICD-10-CM | POA: Diagnosis not present

## 2020-04-01 DIAGNOSIS — Z85828 Personal history of other malignant neoplasm of skin: Secondary | ICD-10-CM | POA: Diagnosis not present

## 2020-04-01 DIAGNOSIS — L905 Scar conditions and fibrosis of skin: Secondary | ICD-10-CM | POA: Diagnosis not present

## 2020-04-01 DIAGNOSIS — L570 Actinic keratosis: Secondary | ICD-10-CM | POA: Diagnosis not present

## 2020-04-01 DIAGNOSIS — L821 Other seborrheic keratosis: Secondary | ICD-10-CM | POA: Diagnosis not present

## 2020-04-06 DIAGNOSIS — H04123 Dry eye syndrome of bilateral lacrimal glands: Secondary | ICD-10-CM | POA: Diagnosis not present

## 2020-04-06 DIAGNOSIS — H2513 Age-related nuclear cataract, bilateral: Secondary | ICD-10-CM | POA: Diagnosis not present

## 2020-04-06 DIAGNOSIS — H43813 Vitreous degeneration, bilateral: Secondary | ICD-10-CM | POA: Diagnosis not present

## 2020-04-06 DIAGNOSIS — H43393 Other vitreous opacities, bilateral: Secondary | ICD-10-CM | POA: Diagnosis not present

## 2020-06-08 ENCOUNTER — Telehealth: Payer: Self-pay | Admitting: Internal Medicine

## 2020-06-08 NOTE — Telephone Encounter (Signed)
She can take imodium OTC.   If symptoms do not improve or she has fever, abdominal pain she should be evaluated

## 2020-06-08 NOTE — Telephone Encounter (Signed)
New message:   Pt is calling and states she has had diarrhea for the last 4 hours and is unable to get off of the toilet. She would like something called in if possible. Pt states she does not want an appt. Please advise.

## 2020-06-20 ENCOUNTER — Ambulatory Visit (INDEPENDENT_AMBULATORY_CARE_PROVIDER_SITE_OTHER): Payer: Medicare Other | Admitting: Internal Medicine

## 2020-06-20 ENCOUNTER — Other Ambulatory Visit: Payer: Self-pay

## 2020-06-20 ENCOUNTER — Encounter: Payer: Self-pay | Admitting: Internal Medicine

## 2020-06-20 VITALS — BP 122/78 | HR 67 | Temp 97.8°F | Ht 66.0 in | Wt 155.6 lb

## 2020-06-20 DIAGNOSIS — Z86718 Personal history of other venous thrombosis and embolism: Secondary | ICD-10-CM | POA: Diagnosis not present

## 2020-06-20 DIAGNOSIS — M8589 Other specified disorders of bone density and structure, multiple sites: Secondary | ICD-10-CM

## 2020-06-20 DIAGNOSIS — E0500 Thyrotoxicosis with diffuse goiter without thyrotoxic crisis or storm: Secondary | ICD-10-CM

## 2020-06-20 DIAGNOSIS — N1831 Chronic kidney disease, stage 3a: Secondary | ICD-10-CM

## 2020-06-20 DIAGNOSIS — E7849 Other hyperlipidemia: Secondary | ICD-10-CM

## 2020-06-20 DIAGNOSIS — R4789 Other speech disturbances: Secondary | ICD-10-CM | POA: Diagnosis not present

## 2020-06-20 DIAGNOSIS — K5909 Other constipation: Secondary | ICD-10-CM | POA: Diagnosis not present

## 2020-06-20 DIAGNOSIS — R7303 Prediabetes: Secondary | ICD-10-CM

## 2020-06-20 DIAGNOSIS — N39498 Other specified urinary incontinence: Secondary | ICD-10-CM

## 2020-06-20 NOTE — Patient Instructions (Addendum)
I recommend you complete a living will and document your wishes.  It's also important to designate a health care power of attorney.    We'll get it authorized for you to continue prolia here.  I'm not concerned about your memory at this time.  Try to stay physically and mentally stimulated to keep your memory intact.

## 2020-06-20 NOTE — Progress Notes (Signed)
Provider:  Rexene Edison. Mariea Clonts, D.O., C.M.D. Location:   [facility]  Place of Service:   clinic  Previous PCP: Gayland Curry, DO Patient Care Team: Gayland Curry, DO as PCP - General (Geriatric Medicine) Tanda Rockers, MD as Consulting Physician (Pulmonary Disease) Marica Otter, [place] (Optometry) Norma Fredrickson, MD as Consulting Physician (Psychiatry) Philemon Kingdom, MD as Consulting Physician (Internal Medicine) Leatha Gilding, MD as Referring Physician (Orthopedic Surgery) Harold Hedge, Darrick Grinder, MD as Consulting Physician (Allergy and Immunology) Ladene Artist, MD as Consulting Physician (Gastroenterology) Pedro Earls, MD as Attending Physician (Family Medicine) Melida Quitter, MD as Consulting Physician (Otolaryngology)  Extended Emergency Contact Information Primary Emergency Contact: Sampley,Carl Address: [address]          [place], [place] [address] [name] of [place] Phone: [phone] Relation: Spouse Secondary Emergency Contact: Driscilla Grammes, [place] [address] [name] of [place] Phone: [phone] Relation: Daughter  Goals of Care: Advanced Directive information Advanced Directives 06/20/2020  Does Patient Have a Medical Advance Directive? No  Would patient like information on creating a medical advance directive? No - Patient declined  discussed advance care planning with her today and the rationale behind it and she agrees to consider  Chief Complaint  Patient presents with  . Establish Care    New patient to establish care    HPI: Patient is a 86 y.o. female seen today to establish with [facility].  Records have been requested from Dr. Bary Richard computer.  She has a h/o bilateral PE in 1992, pneumonia, shingles, flu, hyperthyroidism, prior chole, tonsillectomy and hysterectomy.  Says she's all of her parts removed.  Her memory is a concern for her.  Says it is really getting bad.  She can remember everything that's  taken place, but cannot remember names.  she can look at a move and not remember a famous actor's name.  She has no family history of alzheimer's disease.  She has had covid vaccines.  She's happy and busy.  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  She has not done much art lately--usually works in [place] now.  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  She was a Fish farm manager for 20 years.  She got her chilldren thru college and then went to school herself.   She lost her two sons 10 years ago.  One was hanging in her guest house (had hep c and diabetes and there was nothing they could do).  Another had a stomach flu and swallowed something and she found him in the floor.  Daughter has had diabetes since 64 and is 21 now.  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 She worries about her. She is now an Training and development officer also and does the [employer] for the [employer].    Takes an antidepressant and she feels each day is a gift.  On cymbalta.  She sees her psychiatrist three times a year--Dr. Norma Fredrickson.  She does not sleep but 5-6 hrs--reads three books a week.    She travels a lot and has been to every country in [place] more than once.  Last was 2 years ago now.   Her husband designed Dr. Irven Shelling building.  He also designed their contemporary home in [place].    Asks if statin  and prolia impact her memory.  She doesn't believe the ads on tv after making ads herself.  She tries to place names with little tricks to remember them.  Cannot remember next door cat's name.  She has her own cat, a tabby.  Previously had a dog.    She was getting the prolia with Dr. Quay Burow.  Also takes vitamin D3.    She has some constipation--uses miralax maybe twice a week.  Has always had this trouble.  Has had black stools--eats blueberries and takes pepcid.  Sees Dr.  Fuller Plan for colonoscopies--last was 2019.    She is incontinent and wears depends all of the time.    She does not do the living will and hcpoa stuff.  All of her friends have died or are staying somewhere with alzheimer's.      Had a shingles shot and still got shingles in her right arm.  It went away.  Then she got the shingrix the next year.    Her father had blood clots also.  1992, she started feeling pain in her ribs.  Had initially been thought it was due to tennis and an injury.  She went to [facility] and was super weak.  There she was found to be full of blood clots.  She was on blood thinners.  She says she got blood clots both times in her lower legs after her knee surgeries.  None since TKA 15 yrs ago.  She takes an aspirin.    Hyperthyroidism:  At one time, took a medication for it, went to an endocrinologist--Graves disease--Dr. Cruzita Lederer.  Hair is thinning.    She's been on the keto diet--had gained weight when she broke her foot and people were bringing her food while she was laid up.  Plans to continue the diet through thanksgiving--has lost weight b/c she can close her pants again.  Last GFR 51.  LDL mildly elevated, but stopped lipitor due to concerns it was affecting memory.  MMSE 30/30.  Passed clock.    Grandson writes Armed forces logistics/support/administrative officer and recites it on [employer].  Past Medical History:  Diagnosis Date  . Anxiety   . Arthritis   . Asthma   . Bronchitis    hx of   . Chronic headaches    [facility]  . Cystitis    1954  . Depression   . Ear pain    bilat   . Hiatal hernia   . History of blood clots    Lungs, per [facility] new patient packet   . History of blood transfusion    2010  . Hx of blood clots 2007 & 2010   post TKR  . Hyperthyroidism   . Pelvic fracture (HCC)    hx of in three places secondary to fall   . Pneumonia    hx of 1952  . Pulmonary embolism ([place]) 1993   on HRT  . Right wrist fracture    hx of 1995 has plate secondary to fall   . Shaking    hands  bilat  . Shingles     per [facility] new patient packet   . Shortness of breath dyspnea   . Skin cancer    basal cell   . Spinal stenosis   . Trauma    left lower leg 10/22/2014   . Urinary frequency   . Urticaria    Past Surgical History:  Procedure Laterality Date  . ABDOMINAL HYSTERECTOMY  1969    per [facility]  new patient packet   . ADENOIDECTOMY    . BREAST ENHANCEMENT SURGERY  1985  . CHOLECYSTECTOMY  1992    per [facility] new patient packet   . COLONOSCOPY     diverticulosis  . ESI   11/2013    C7-T1 ; Dr Nelva Bush  . EYE SURGERY     bilat cataract surgery   . INSERTION OF MESH N/A 07/08/2015   Procedure: INSERTION OF MESH;  Surgeon: Armandina Gemma, MD;  Location: [facility];  Service: General;  Laterality: N/A;  . REPLACEMENT TOTAL KNEE BILATERAL  2007 & 2010  . SP FACET INJECTION  01/19/14   RC2-3,3-4,C4-5  . TONSILLECTOMY  1945    per [facility] new patient packet   . VENTRAL HERNIA REPAIR N/A 07/08/2015   Procedure: VENTRAL ADULT REPAIR VENTRAL INCISIONAL HERNIA REPAIR;  Surgeon: Armandina Gemma, MD;  [facility]: [facility];  Service: General;  Laterality: N/A;  . WRIST FRACTURE SURGERY Bilateral 10  years and 2 years/  . WRIST SURGERY Left 06/2016   plate     Social History   Socioeconomic History  . Marital status: Married    Spouse name: Not on file  . Number of children: 3  . Years of education: Not on file  . Highest education level: Not on file  Occupational History  . Occupation: retired  Tobacco Use  . Smoking status: Never Smoker  . Smokeless tobacco: Never Used  Vaping Use  . Vaping Use: Never used  Substance and Sexual Activity  . Alcohol use: Yes    Alcohol/week: 4.0 standard drinks    Types: 4 Glasses of wine per week    Comment: minimal  . Drug use: No  . Sexual activity: Not Currently  Other Topics Concern  . 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 No   Do you have difficulty preparing food or eating? No   Do you have difficulty managing your medications? No   Do you have difficulty managing your finances? No   Do you have difficulty affording your medications? No   Social Determinants of Health   Financial Resource Strain:   . Difficulty of Paying Living Expenses:   Food Insecurity:   . Worried About Charity fundraiser in the Last Year:   . Arboriculturist in the Last Year:   Transportation Needs:   . Film/video editor (Medical):   Marland Kitchen Lack of Transportation (  Non-Medical):   Physical Activity: Unknown  . Days of Exercise per Week: 5 days  . Minutes of Exercise per Session: Not on file  Stress:   . Feeling of Stress :   Social Connections:   . Frequency of Communication with Friends and Family:   . Frequency of Social Gatherings with Friends and Family:   . Attends Religious Services:   . Active Member of Clubs or Organizations:   . Attends Archivist Meetings:   Marland Kitchen Marital Status:     reports that she has never smoked. She has never used smokeless tobacco. She reports current alcohol use of about 4.0 standard drinks of alcohol per week. She reports that she does not use drugs.  Functional Status Survey:    Family History  Problem Relation Age of Onset  . Allergies Brother   . Asthma Brother   . Clotting disorder Father        PTE X 2  . Skin cancer  Father   . Heart failure Father        Per [facility] New Patient Packet   . Skin cancer Mother   . Pneumonia Mother        1/2 lung loss per [facility] Patient Packet   . Sudden death Other        2 M aunts & 2 M uncles in 20s  . Heart attack Brother        > 55  . Bladder Cancer Brother   . Schizophrenia Son        Per [facility] New Patient Packet   . Lung disease Son        Per [facility] New Patient Packet   . Mental illness Son        suicide by hanging  . Diabetes Son   . Hepatitis C Son   . Diabetes Daughter   . Migraines Daughter   . Heart attack Daughter   . Breast cancer Daughter   . Stroke Neg Hx     Health Maintenance  Topic Date Due  . INFLUENZA VACCINE  07/31/2020  . DEXA SCAN  10/13/2021  . TETANUS/TDAP  10/10/2024  . COVID-19 Vaccine  Completed  . PNA vac Low Risk Adult  Completed    Allergies  Allergen Reactions  . Penicillins Hives  . Sulfa Antibiotics Hives and Nausea And Vomiting    7-15    Outpatient Encounter Medications as of 06/20/2020  Medication Sig  . ascorbic acid (VITAMIN C) 500 MG tablet Take 500 mg by mouth daily.  Marland Kitchen aspirin EC 81 MG tablet Take 81 mg by mouth daily.  . cholecalciferol (VITAMIN D) 1000 UNITS tablet Take 1,000 Units by mouth daily.  . Cyanocobalamin (B-12) 3000 MCG SUBL Place 1 tablet under the tongue daily.  Marland Kitchen denosumab (PROLIA) 60 MG/ML SOSY injection Inject 60 mg into the skin every 6 (six) months.  . DULoxetine (CYMBALTA) 30 MG capsule Take 30 mg by mouth daily.   . famotidine (PEPCID) 40 MG tablet Take 1 tablet (40 mg total) by mouth at bedtime.  . fluticasone (FLONASE) 50 MCG/ACT nasal spray Place 1 spray into both nostrils as needed for allergies or rhinitis.  . polyethylene glycol (MIRALAX / GLYCOLAX) 17 g packet Take 17 g by mouth daily as needed.  . [DISCONTINUED] atorvastatin (LIPITOR) 20 MG tablet Take 1 tablet (20 mg total) by mouth daily.  . [DISCONTINUED] Fluticasone-Salmeterol (ADVAIR) 100-50 MCG/DOSE AEPB Inhale 1 puff into  the lungs 2 (two)  times daily.   No facility-administered encounter medications on file as of 06/20/2020.    Review of Systems  Constitutional: Positive for malaise/fatigue.  HENT: Positive for congestion, hearing loss, sinus pain and tinnitus.        Hearing aid, edentulous--has dentures  Eyes:       Corrective lenses,dry eyes, glaucoma, itching  Respiratory: Positive for cough.        Asthma, sputum production  Cardiovascular: Negative for chest pain, palpitations, orthopnea and leg swelling.  Gastrointestinal: Positive for constipation and melena.       Hemorrhoids, hiatal hernia, flatulence  Genitourinary:       Incontinence  Musculoskeletal: Positive for back pain. Negative for falls.  Skin: Positive for itching.       Skin discoloration  Neurological: Positive for headaches.       Migraines  Endo/Heme/Allergies: Bruises/bleeds easily.       Lumpectomy  Psychiatric/Behavioral: Positive for depression and memory loss. The patient is nervous/anxious and has insomnia.        Found two sons both dead--one hanging and one on the floor 6 months apart    Vitals:   06/20/20 1328  BP: 122/78  Pulse: 67  Temp: 97.8 F (36.6 C)  TempSrc: Temporal  SpO2: 97%  Weight: 155 lb 9.6 oz (70.6 kg)  Height: 5\' 6"  (1.676 m)   Body mass index is 25.11 kg/m. Physical Exam Vitals reviewed.  Constitutional:      Appearance: Normal appearance.  HENT:     Head: Normocephalic and atraumatic.     Right Ear: Tympanic membrane, ear canal and external ear normal.     Left Ear: Tympanic membrane, ear canal and external ear normal.     Nose: Nose normal.     Mouth/Throat:     Pharynx: Oropharynx is clear.  Eyes:     Extraocular Movements: Extraocular movements intact.     Conjunctiva/sclera: Conjunctivae normal.     Pupils: Pupils are equal, round, and reactive to light.  Cardiovascular:     Rate and Rhythm: Normal rate and regular rhythm.     Pulses: Normal pulses.     Heart sounds:  Normal heart sounds.  Pulmonary:     Effort: Pulmonary effort is normal.     Breath sounds: Normal breath sounds. No wheezing, rhonchi or rales.  Abdominal:     General: Bowel sounds are normal.  Musculoskeletal:        General: No swelling or tenderness. Normal range of motion.     Cervical back: Neck supple.     Right lower leg: No edema.     Left lower leg: No edema.  Skin:    General: Skin is warm and dry.  Neurological:     General: No focal deficit present.     Mental Status: She is alert and oriented to person, place, and time.     Cranial Nerves: No cranial nerve deficit.     Sensory: No sensory deficit.     Motor: No weakness.     Coordination: Coordination normal.     Gait: Gait normal.     Deep Tendon Reflexes: Reflexes normal.     Comments: Very minimal word-finding and name-finding   Psychiatric:        Mood and Affect: Mood normal.        Behavior: Behavior normal.        Thought Content: Thought content normal.        Judgment: Judgment normal.  Labs reviewed: Basic Metabolic Panel: Recent Labs    10/13/19 1416 10/21/19 1001  NA 139 139  K 4.8 4.2  CL 103 104  CO2 29 27  GLUCOSE 93 101*  BUN 28* 21  CREATININE 1.27* 1.02  CALCIUM 9.7 9.1   Liver Function Tests: Recent Labs    10/13/19 1416  AST 19  ALT 15  ALKPHOS 103  BILITOT 0.8  PROT 7.0  ALBUMIN 4.3   No results for input(s): LIPASE, AMYLASE in the last 8760 hours. No results for input(s): AMMONIA in the last 8760 hours. CBC: Recent Labs    10/13/19 1416  WBC 7.4  NEUTROABS 4.0  HGB 13.1  HCT 39.6  MCV 95.9  PLT 283.0   Cardiac Enzymes: No results for input(s): CKTOTAL, CKMB, CKMBINDEX, TROPONINI in the last 8760 hours. BNP: Invalid input(s): POCBNP Lab Results  Component Value Date   HGBA1C 5.8 10/13/2019   Lab Results  Component Value Date   TSH 4.18 10/13/2019   Lab Results  Component Value Date   VITAMINB12 1,060 (H) 03/16/2015   No results found for:  FOLATE No results found for: IRON, TIBC, FERRITIN  Imaging and Procedures noted on new patient packet:  Assessment/Plan 1. Word finding difficulty -does not seem outside of norm for age -scored perfect on mmse but seems to be highly educated -will monitor  2. Personal history of venous thrombosis and embolism -on baby asa alone, monitor for recurrence  3. Osteopenia of multiple sites -f/u bone density at 2 year mark -will get her authorized for prolia here  4. Other constipation -encouraged hydration, high fiber diet, increased physical activity, daily miralax prn  5. Graves disease -f/u tsh - TSH; Future  6. Stage 3a chronic kidney disease -Avoid nephrotoxic agents like nsaids, dose adjust renally excreted meds, hydrate. - CBC with Differential/Platelet; Future - COMPLETE METABOLIC PANEL WITH GFR; Future  7. Other urinary incontinence -counseled on kegels, atrophy, was not at a point where she wanted meds  8. Prediabetes -she'd gained a bunch of weight and is now on a keto diet to lose -f/u labs before we meet next time Lab Results  Component Value Date   HGBA1C 5.8 10/13/2019  - Hemoglobin A1c; Future  9. Other hyperlipidemia -not on meds, f/u lab before next visit - Lipid panel; Future  Labs/tests ordered:   Orders Placed This Encounter  Procedures  . CBC with Differential/Platelet    Standing Status:   Future    Standing Expiration Date:   06/20/2021  . COMPLETE METABOLIC PANEL WITH GFR    Standing Status:   Future    Standing Expiration Date:   06/20/2021  . Lipid panel    Standing Status:   Future    Standing Expiration Date:   06/20/2021    Order Specific Question:   Has the patient fasted?    Answer:   Yes  . Hemoglobin A1c    Standing Status:   Future    Standing Expiration Date:   06/20/2021    Order Specific Question:   Release to patient    Answer:   Immediate  . TSH    Standing Status:   Future    Standing Expiration Date:   06/20/2021   108  mins spent on this visit today  Dean Goldner L. Yudith Norlander, D.O. [facility] [address], [place] [address] Cell Phone (Mon-Fri 8am-5pm):  [phone] On Call:  [phone] & follow prompts after 5pm &  weekends Office Phone:  [phone] Office Fax:  [phone]

## 2020-06-25 ENCOUNTER — Ambulatory Visit (INDEPENDENT_AMBULATORY_CARE_PROVIDER_SITE_OTHER): Payer: Medicare Other | Admitting: Podiatry

## 2020-06-25 ENCOUNTER — Other Ambulatory Visit: Payer: Self-pay

## 2020-06-25 ENCOUNTER — Encounter: Payer: Self-pay | Admitting: Podiatry

## 2020-06-25 DIAGNOSIS — L600 Ingrowing nail: Secondary | ICD-10-CM

## 2020-06-25 DIAGNOSIS — M79675 Pain in left toe(s): Secondary | ICD-10-CM

## 2020-06-25 DIAGNOSIS — L603 Nail dystrophy: Secondary | ICD-10-CM

## 2020-06-25 MED ORDER — UREA 40 % EX GEL: 1.0000 "application " | Freq: Every day | CUTANEOUS | 0 refills | Status: DC

## 2020-06-25 NOTE — Patient Instructions (Signed)

## 2020-06-26 NOTE — Progress Notes (Signed)
Subjective:   Patient ID: Faith Edwards, female   DOB: 86 y.o.   MRN: [id number]   HPI 86 year old female presents the office today for concerns of her left big toe wound, thickened discolored and coming off causing discomfort in the tip of the toe.  She initially states that she was having nail removal and to discuss options.  Denies any drainage or pus.  This is been a chronic issue for her.   Review of Systems  All other systems reviewed and are negative.  Past Medical History:  Diagnosis Date  . Anxiety   . Arthritis   . Asthma   . Bronchitis    hx of   . Chronic headaches    [facility]  . Cystitis    1954  . Depression   . Ear pain    bilat   . Hiatal hernia   . History of blood clots    Lungs, per [facility] new patient packet   . History of blood transfusion    2010  . Hx of blood clots 2007 & 2010   post TKR  . Hyperthyroidism   . Pelvic fracture (HCC)    hx of in three places secondary to fall   . Pneumonia    hx of 1952  . Pulmonary embolism ([place]) 1993   on HRT  . Right wrist fracture    hx of 1995 has plate secondary to fall   . Shaking    hands bilat  . Shingles     per [facility] new patient packet   . Shortness of breath dyspnea   . Skin cancer    basal cell   . Spinal stenosis   . Trauma    left lower leg 10/22/2014   . Urinary frequency   . Urticaria     Past Surgical History:  Procedure Laterality Date  . ABDOMINAL HYSTERECTOMY  1969    per [facility] new patient packet   . ADENOIDECTOMY    . BREAST ENHANCEMENT SURGERY  1985  . CHOLECYSTECTOMY  1992    per [facility] new patient packet   . COLONOSCOPY     diverticulosis  . ESI   11/2013    C7-T1 ; Dr Nelva Bush  . EYE SURGERY     bilat cataract surgery   . INSERTION OF MESH N/A 07/08/2015   Procedure: INSERTION OF MESH;  Surgeon: Armandina Gemma, MD;  Location: [facility];  Service: General;  Laterality: N/A;  . REPLACEMENT TOTAL KNEE BILATERAL  2007 & 2010  . SP FACET INJECTION  01/19/14   RC2-3,3-4,C4-5  .  TONSILLECTOMY  1945    per [facility] new patient packet   . VENTRAL HERNIA REPAIR N/A 07/08/2015   Procedure: VENTRAL ADULT REPAIR VENTRAL INCISIONAL HERNIA REPAIR;  Surgeon: Armandina Gemma, MD;  Location: [facility];  Service: General;  Laterality: N/A;  . WRIST FRACTURE SURGERY Bilateral 10  years and 2 years/  . WRIST SURGERY Left 06/2016   plate      Current Outpatient Medications:  .  ascorbic acid (VITAMIN C) 500 MG tablet, Take 500 mg by mouth daily., Disp: , Rfl:  .  aspirin EC 81 MG tablet, Take 81 mg by mouth daily., Disp: , Rfl:  .  cholecalciferol (VITAMIN D) 1000 UNITS tablet, Take 1,000 Units by mouth daily., Disp: , Rfl:  .  Cyanocobalamin (B-12) 3000 MCG SUBL, Place 1 tablet under the tongue daily., Disp: , Rfl:  .  denosumab (PROLIA) 60 MG/ML SOSY injection,  Inject 60 mg into the skin every 6 (six) months., Disp: , Rfl:  .  DULoxetine (CYMBALTA) 30 MG capsule, Take 30 mg by mouth daily. , Disp: , Rfl: 4 .  famotidine (PEPCID) 40 MG tablet, Take 1 tablet (40 mg total) by mouth at bedtime., Disp: 30 tablet, Rfl: 11 .  fluticasone (FLONASE) 50 MCG/ACT nasal spray, Place 1 spray into both nostrils as needed for allergies or rhinitis., Disp: , Rfl:  .  polyethylene glycol (MIRALAX / GLYCOLAX) 17 g packet, Take 17 g by mouth daily as needed., Disp: , Rfl:  .  Urea 40 % GEL, Apply 1 application topically daily. Apply to left big toenail, Disp: 15 mL, Rfl: 0  Allergies  Allergen Reactions  . Penicillins Hives  . Sulfa Antibiotics Hives and Nausea And Vomiting    7-15         Objective:  Physical Exam  General: AAO x3, NAD  Dermatological: Left hallux toenails hypertrophic, dystrophic with brown discoloration and only one half the nail is intact and is currently adhered to the nailbed.  The distal aspect there is no nail looks like she has tried to trim it herself.  There is no edema, erythema and signs of infection.  Tenderness most of the distal aspect of toe where she had previously  trim the nail.  Vascular: Dorsalis Pedis artery and Posterior Tibial artery pedal pulses are 2/4 bilateral with immedate capillary fill time. There is no pain with calf compression, swelling, warmth, erythema.   Neruologic: Grossly intact via light touch bilateral.   Musculoskeletal: No gross boney pedal deformities bilateral. No pain, crepitus, or limitation noted with foot and ankle range of motion bilateral. Muscular strength 5/5 in all groups tested bilateral.     Assessment:   Left hallux onychodystrophy     Plan:  -Treatment options discussed including all alternatives, risks, and complications -Etiology of symptoms were discussed -We discussed treatment options.  After discussion she wants to hold off on nail removal.  I sharply debrided the nail but any complications or bleeding.  Prescribed urea gel.  If symptoms continue we will remove the nail.  She did not want to take nail permanently.  Return if symptoms worsen or fail to improve.  Trula Slade DPM

## 2020-07-25 DIAGNOSIS — F064 Anxiety disorder due to known physiological condition: Secondary | ICD-10-CM | POA: Diagnosis not present

## 2020-08-01 ENCOUNTER — Other Ambulatory Visit: Payer: Self-pay | Admitting: Gastroenterology

## 2020-08-05 ENCOUNTER — Telehealth: Payer: Self-pay

## 2020-08-05 ENCOUNTER — Telehealth (INDEPENDENT_AMBULATORY_CARE_PROVIDER_SITE_OTHER): Payer: Medicare Other | Admitting: Family

## 2020-08-05 ENCOUNTER — Encounter: Payer: Self-pay | Admitting: Family

## 2020-08-05 ENCOUNTER — Other Ambulatory Visit: Payer: Self-pay

## 2020-08-05 DIAGNOSIS — U071 COVID-19: Secondary | ICD-10-CM

## 2020-08-05 DIAGNOSIS — A0839 Other viral enteritis: Secondary | ICD-10-CM | POA: Diagnosis not present

## 2020-08-05 DIAGNOSIS — T732XXA Exhaustion due to exposure, initial encounter: Secondary | ICD-10-CM | POA: Diagnosis not present

## 2020-08-05 DIAGNOSIS — R52 Pain, unspecified: Secondary | ICD-10-CM | POA: Diagnosis not present

## 2020-08-05 DIAGNOSIS — R058 Other specified cough: Secondary | ICD-10-CM

## 2020-08-05 DIAGNOSIS — R05 Cough: Secondary | ICD-10-CM

## 2020-08-05 DIAGNOSIS — R509 Fever, unspecified: Secondary | ICD-10-CM

## 2020-08-05 DIAGNOSIS — Z20822 Contact with and (suspected) exposure to covid-19: Secondary | ICD-10-CM | POA: Diagnosis not present

## 2020-08-05 MED ORDER — ZINC GLUCONATE 50 MG PO TABS: 50.0000 mg | ORAL_TABLET | Freq: Every day | ORAL | 0 refills | Status: AC

## 2020-08-05 MED ORDER — GUAIFENESIN ER 600 MG PO TB12: 600.0000 mg | ORAL_TABLET | Freq: Two times a day (BID) | ORAL | 0 refills | Status: DC

## 2020-08-05 NOTE — Telephone Encounter (Signed)
[name], nabi are scheduled for a virtual visit with your provider today.    Just as we do with appointments in the office, we must obtain your consent to participate.  Your consent will be active for this visit and any virtual visit you may have with one of our providers in the next 365 days.    If you have a MyChart account, I can also send a copy of this consent to you electronically.  All virtual visits are billed to your insurance company just like a traditional visit in the office.  As this is a virtual visit, video technology does not allow for your provider to perform a traditional examination.  This may limit your provider's ability to fully assess your condition.  If your provider identifies any concerns that need to be evaluated in person or the need to arrange testing such as labs, EKG, etc, we will make arrangements to do so.    Although advances in technology are sophisticated, we cannot ensure that it will always work on either your end or our end.  If the connection with a video visit is poor, we may have to switch to a telephone visit.  With either a video or telephone visit, we are not always able to ensure that we have a secure connection.   I need to obtain your verbal consent now.   Are you willing to proceed with your visit today?   Faith Edwards has provided verbal consent on 08/05/2020 for a virtual visit (video or telephone).   Faith Edwards, [place] 08/05/2020  11:21 AM

## 2020-08-05 NOTE — Progress Notes (Signed)
This service is provided via telemedicine  No vital signs collected/recorded due to the encounter was a telemedicine visit.   Location of patient (ex: home, work): Home.  Patient consents to a telephone visit: Yes.  Location of the provider (ex: office, home):  [facility].   Name of any referring provider: N/A  Names of all persons participating in the telemedicine service and their role in the encounter: Patient, Heriberto Antigua, [place], [place], Webb Silversmith, NP.    Time spent on call: 8 minutes spent on the phone with Medical Assistant.     Location:      Place of Service:    Provider: Rhyan Wolters FNP-C  Gayland Curry, DO  Patient Care Team: Gayland Curry, DO as PCP - General (Geriatric Medicine) Tanda Rockers, MD as Consulting Physician (Pulmonary Disease) Marica Otter, [place] (Optometry) Norma Fredrickson, MD as Consulting Physician (Psychiatry) Philemon Kingdom, MD as Consulting Physician (Internal Medicine) Leatha Gilding, MD as Referring Physician (Orthopedic Surgery) Harold Hedge, Darrick Grinder, MD as Consulting Physician (Allergy and Immunology) Ladene Artist, MD as Consulting Physician (Gastroenterology) Pedro Earls, MD as Attending Physician (Family Medicine) Melida Quitter, MD as Consulting Physician (Otolaryngology)  Extended Emergency Contact Information Primary Emergency Contact: Tolin,Carl Address: [address]          [place], [place] [address] [name] of [place] Phone: [phone] Relation: Spouse Secondary Emergency Contact: [place], [place] [address] [name] of [place] Phone: [phone] Relation: Daughter  Code Status:  Full Code  Goals of care: Advanced Directive information Advanced Directives 08/05/2020  Does Patient Have a Medical Advance Directive? No  Would patient like information on creating a medical advance directive? No - Patient declined     Chief Complaint  Patient presents with    . Acute Visit    Complains of Headache, 101 Fever, Fatigue, Coughing, and Diarrhea.    HPI:  Pt is a 86 y.o. female seen today for an acute visit for evaluation of symptoms of COVID-19.States went to husband high school reunion several days ago where they had a large group of people in [place].Two people had COVID-19 positive.she tried to sit away from other people but husband was socializing and greeting other people.He got sick which in turn made sick too. Had had headache,temp 101,tiredness.coughed the whole night yesterday.Has also had diarrhea.Has chronic shortness.she went walking this morning but was more short of breath than usual had to go back home. Has no appetite.Had a banana for breakfast and no sense of taste.Temp last night was 101 but no fever today.Has body aches.   Past Medical History:  Diagnosis Date  . Anxiety   . Arthritis   . Asthma   . Bronchitis    hx of   . [facility]    [facility]  . Cystitis    1954  . Depression   . Ear pain    bilat   . Hiatal hernia   . History of blood clots    Lungs, per [facility] new patient packet   . History of blood transfusion    2010  . Hx of blood clots 2007 & 2010   post TKR  . Hyperthyroidism   . Pelvic fracture (H[facility])    hx of in three places secondary to fall   . Pneumonia    hx of 1952  . Pulmonary embolism ([place]) 1993   on HRT  .  Right wrist fracture    hx of 1995 has plate secondary to fall   . Shaking    hands bilat  . Shingles     per [facility] new patient packet   . Shortness of breath dyspnea   . Skin cancer    basal cell   . Spinal stenosis   . Trauma    left lower leg 10/22/2014   . Urinary frequency   . Urticaria    Past Surgical History:  Procedure Laterality Date  . ABDOMINAL HYSTERECTOMY  1969    per [facility] new patient packet   . ADENOIDECTOMY    . BREAST ENHANCEMENT SURGERY  1985  . CHOLECYSTECTOMY  1992    per [facility] new patient packet   . COLONOSCOPY     diverticulosis  . ESI    11/2013    C7-T1 ; Dr Nelva Bush  . EYE SURGERY     bilat cataract surgery   . INSERTION OF MESH N/A 07/08/2015   Procedure: INSERTION OF MESH;  Surgeon: Armandina Gemma, MD;  Location: [facility];  Service: General;  Laterality: N/A;  . REPLACEMENT TOTAL KNEE BILATERAL  2007 & 2010  . SP FACET INJECTION  01/19/14   RC2-3,3-4,C4-5  . TONSILLECTOMY  1945    per [facility] new patient packet   . VENTRAL HERNIA REPAIR N/A 07/08/2015   Procedure: VENTRAL ADULT REPAIR VENTRAL INCISIONAL HERNIA REPAIR;  Surgeon: Armandina Gemma, MD;  Location: [facility];  Service: General;  Laterality: N/A;  . WRIST FRACTURE SURGERY Bilateral 10  years and 2 years/  . WRIST SURGERY Left 06/2016   plate     Allergies  Allergen Reactions  . Penicillins Hives  . Sulfa Antibiotics Hives and Nausea And Vomiting    7-15    Outpatient Encounter Medications as of 08/05/2020  Medication Sig  . ascorbic acid (VITAMIN C) 500 MG tablet Take 500 mg by mouth daily.  Marland Kitchen aspirin EC 81 MG tablet Take 81 mg by mouth daily.  Marland Kitchen atorvastatin (LIPITOR) 20 MG tablet Take 20 mg by mouth daily.  . cholecalciferol (VITAMIN D) 1000 UNITS tablet Take 1,000 Units by mouth daily.  . Cyanocobalamin (B-12) 3000 MCG SUBL Place 1 tablet under the tongue daily.  Marland Kitchen denosumab (PROLIA) 60 MG/ML SOSY injection Inject 60 mg into the skin every 6 (six) months.  . DULoxetine (CYMBALTA) 30 MG capsule Take 30 mg by mouth daily.   . famotidine (PEPCID) 40 MG tablet TAKE 1 TABLET(40 MG) BY MOUTH AT BEDTIME  . polyethylene glycol (MIRALAX / GLYCOLAX) 17 g packet Take 17 g by mouth daily as needed.  . Urea 40 % GEL Apply 1 application topically daily. Apply to left big toenail  . [DISCONTINUED] fluticasone (FLONASE) 50 MCG/ACT nasal spray Place 1 spray into both nostrils as needed for allergies or rhinitis.  . [DISCONTINUED] Fluticasone-Salmeterol (ADVAIR) 100-50 MCG/DOSE AEPB Inhale 1 puff into the lungs 2 (two) times daily.   No facility-administered encounter medications on  file as of 08/05/2020.    Review of Systems  Constitutional: Positive for appetite change, fatigue and fever.       Loss of taste.Has generalized body aches  HENT: Negative for congestion, postnasal drip, rhinorrhea, sinus pressure, sinus pain, sneezing, sore throat and trouble swallowing.   Eyes: Negative for discharge, redness and itching.  Respiratory: Positive for cough. Negative for chest tightness and wheezing.   Cardiovascular: Negative for chest pain, palpitations and leg swelling.  Gastrointestinal: Positive for diarrhea. Negative for abdominal distention,  abdominal pain, constipation, nausea and vomiting.  Genitourinary: Negative for difficulty urinating, dysuria, frequency and urgency.  Musculoskeletal: Positive for myalgias.  Skin: Negative for color change, pallor and rash.  Neurological: Negative for dizziness, speech difficulty, light-headedness, numbness and headaches.  Psychiatric/Behavioral: Positive for sleep disturbance. Negative for agitation and confusion. The patient is not nervous/anxious.        Cough keeping her awake     Immunization History  Administered Date(s) Administered  . Fluad Quad(high Dose 65+) 09/24/2019  . Influenza Whole 10/28/2008, 09/28/2009, 10/08/2011  . Influenza, Seasonal, Injecte, Preservative Fre 11/03/2013  . Influenza,inj,Quad PF,6+ Mos 10/05/2014, 09/30/2015  . Influenza-Unspecified 09/25/2016, 09/09/2017, 07/31/2018  . PFIZER SARS-COV-2 Vaccination 01/01/2020, 01/30/2020  . Pneumococcal Conjugate-13 09/30/2015  . Pneumococcal Polysaccharide-23 01/07/2014  . Tdap 10/10/2014  . Zoster Recombinat (Shingrix) 07/05/2018, 09/09/2018   Pertinent  Health Maintenance Due  Topic Date Due  . INFLUENZA VACCINE  07/31/2020  . DEXA SCAN  10/13/2021  . PNA vac Low Risk Adult  Completed   Fall Risk  08/05/2020 06/20/2020 08/26/2019 07/18/2018 05/07/2017  Falls in the past year? 0 1 0 No Yes  Number falls in past yr: 0 0 0 - 1  Injury with Fall? 0 1  0 - Yes  Comment - Broke her foot - - -  Risk Factor Category  - - - - High Fall Risk  Risk for fall due to : - - - Impaired mobility;Impaired balance/gait;History of fall(s) Impaired balance/gait  Risk for fall due to: Comment - - - - recently finish PT to help improve ambulatory gait  Follow up - - Falls prevention discussed - Education provided;Falls prevention discussed    There were no vitals filed for this visit. There is no height or weight on file to calculate BMI. Physical Exam  Unable to complete on telephone visit.  Labs reviewed: Recent Labs    10/13/19 1416 10/21/19 1001  NA 139 139  K 4.8 4.2  CL 103 104  CO2 29 27  GLUCOSE 93 101*  BUN 28* 21  CREATININE 1.27* 1.02  CALCIUM 9.7 9.1   Recent Labs    10/13/19 1416  AST 19  ALT 15  ALKPHOS 103  BILITOT 0.8  PROT 7.0  ALBUMIN 4.3   Recent Labs    10/13/19 1416  WBC 7.4  NEUTROABS 4.0  HGB 13.1  HCT 39.6  MCV 95.9  PLT 283.0   Lab Results  Component Value Date   TSH 4.18 10/13/2019   Lab Results  Component Value Date   HGBA1C 5.8 10/13/2019   Lab Results  Component Value Date   CHOL 181 10/13/2019   HDL 64.60 10/13/2019   LDLCALC 102 (H) 10/13/2019   TRIG 76.0 10/13/2019   CHOLHDL 3 10/13/2019    Significant Diagnostic Results in last 30 days:  No results found.  Assessment/Plan 1. Cough with exposure to COVID-19 virus Temp 101 last night but none today post exposure to person with COVID-19 - Advised to get screening sooner but states will be going to [facility] on Monday to get a rapid test for COVID-19. - continue with Vit C and Vitamin D supplement  - guaiFENesin (MUCINEX) 600 MG 12 hr tablet; Take 1 tablet (600 mg total) by mouth 2 (two) times daily.  Dispense: 60 tablet; Refill: 0 - zinc gluconate 50 MG tablet; Take 1 tablet (50 mg total) by mouth daily for 14 days.  Dispense: 14 tablet; Refill: 0 - Continue to wear facial mask ,Self Quarantine  per CDC guidelines,wiping  surfaces, washing hands and social distancing   - Advised to going to the ED if symptoms worsen or develop shortness of breath   2. Fever with exposure to COVID-19 virus Had fever last night but none today. - Take Tylenol as needed   3. Diarrhea due to COVID-19 - encouraged to increase fluid intake.Go to ED if symptoms worsen   4. Generalized body aches - Advised to increase fluid intake to prevent dehydration  - suspect due to COVID-19 exposure   5. Fatigue due to exposure, initial encounter Advised to go to ED if symptoms worsen.   Family/ staff Communication: Reviewed plan of care with patient verbalized understanding.  Labs/tests ordered: Recommended getting tested for COVID-19 sooner but declined states [facility] on Monday.    Next Appointment: As needed if symptoms worsen or fail to improve.   I connected with  Kerstin Crusoe Kabel on 08/05/20 by a video enabled telemedicine application and verified that I am speaking with the correct person using two identifiers.   I discussed the limitations of evaluation and management by telemedicine. The patient expressed understanding and agreed to proceed.  Spent 12 minutes of non-face to face with patient    Sandrea Hughs, NP

## 2020-08-05 NOTE — Patient Instructions (Addendum)
- Please get screen for COVID-19 due to exposure   - Take Mucinex 600 mg tablet one by mouth twice daily for cough   - continue with Vit C and Vitamin D supplement   - Take Zinc 50 mg tablet one by mouth daily for 14 days then stop.  - Continue to wear facial mask ,Self Quarantine per CDC guidelines,wiping surfaces, washing hands and social distancing    - Recommend going to the ED if symptoms worsen or develop shortness of breath    COVID-19 COVID-19 is a respiratory infection that is caused by a virus called severe acute respiratory syndrome coronavirus 2 (SARS-CoV-2). The disease is also known as coronavirus disease or novel coronavirus. In some people, the virus may not cause any symptoms. In others, it may cause a serious infection. The infection can get worse quickly and can lead to complications, such as:  Pneumonia, or infection of the lungs.  Acute respiratory distress syndrome or ARDS. This is a condition in which fluid build-up in the lungs prevents the lungs from filling with air and passing oxygen into the blood.  Acute respiratory failure. This is a condition in which there is not enough oxygen passing from the lungs to the body or when carbon dioxide is not passing from the lungs out of the body.  Sepsis or septic shock. This is a serious bodily reaction to an infection.  Blood clotting problems.  Secondary infections due to bacteria or fungus.  Organ failure. This is when your body's organs stop working. The virus that causes COVID-19 is contagious. This means that it can spread from person to person through droplets from coughs and sneezes (respiratory secretions). What are the causes? This illness is caused by a virus. You may catch the virus by:  Breathing in droplets from an infected person. Droplets can be spread by a person breathing, speaking, singing, coughing, or sneezing.  Touching something, like a table or a doorknob, that was exposed to the virus  (contaminated) and then touching your mouth, nose, or eyes. What increases the risk? Risk for infection You are more likely to be infected with this virus if you:  Are within 6 feet (2 meters) of a person with COVID-19.  Provide care for or live with a person who is infected with COVID-19.  Spend time in crowded indoor spaces or live in shared housing. Risk for serious illness You are more likely to become seriously ill from the virus if you:  Are 51 years of age or older. The higher your age, the more you are at risk for serious illness.  Live in a nursing home or long-term care facility.  Have cancer.  Have a long-term (chronic) disease such as: ? Chronic lung disease, including chronic obstructive pulmonary disease or asthma. ? A long-term disease that lowers your body's ability to fight infection (immunocompromised). ? Heart disease, including heart failure, a condition in which the arteries that lead to the heart become narrow or blocked (coronary artery disease), a disease which makes the heart muscle thick, weak, or stiff (cardiomyopathy). ? Diabetes. ? Chronic kidney disease. ? Sickle cell disease, a condition in which red blood cells have an abnormal "sickle" shape. ? Liver disease.  Are obese. What are the signs or symptoms? Symptoms of this condition can range from mild to severe. Symptoms may appear any time from 2 to 14 days after being exposed to the virus. They include:  A fever or chills.  A cough.  Difficulty breathing.  Headaches, body aches, or muscle aches.  Runny or stuffy (congested) nose.  A sore throat.  New loss of taste or smell. Some people may also have stomach problems, such as nausea, vomiting, or diarrhea. Other people may not have any symptoms of COVID-19. How is this diagnosed? This condition may be diagnosed based on:  Your signs and symptoms, especially if: ? You live in an area with a COVID-19 outbreak. ? You recently traveled to  or from an area where the virus is common. ? You provide care for or live with a person who was diagnosed with COVID-19. ? You were exposed to a person who was diagnosed with COVID-19.  A physical exam.  Lab tests, which may include: ? Taking a sample of fluid from the back of your nose and throat (nasopharyngeal fluid), your nose, or your throat using a swab. ? A sample of mucus from your lungs (sputum). ? Blood tests.  Imaging tests, which may include, X-rays, CT scan, or ultrasound. How is this treated? At present, there is no medicine to treat COVID-19. Medicines that treat other diseases are being used on a trial basis to see if they are effective against COVID-19. Your health care provider will talk with you about ways to treat your symptoms. For most people, the infection is mild and can be managed at home with rest, fluids, and over-the-counter medicines. Treatment for a serious infection usually takes places in a hospital intensive care unit (ICU). It may include one or more of the following treatments. These treatments are given until your symptoms improve.  Receiving fluids and medicines through an IV.  Supplemental oxygen. Extra oxygen is given through a tube in the nose, a face mask, or a hood.  Positioning you to lie on your stomach (prone position). This makes it easier for oxygen to get into the lungs.  Continuous positive airway pressure (CPAP) or bi-level positive airway pressure (BPAP) machine. This treatment uses mild air pressure to keep the airways open. A tube that is connected to a motor delivers oxygen to the body.  Ventilator. This treatment moves air into and out of the lungs by using a tube that is placed in your windpipe.  Tracheostomy. This is a procedure to create a hole in the neck so that a breathing tube can be inserted.  Extracorporeal membrane oxygenation (ECMO). This procedure gives the lungs a chance to recover by taking over the functions of the  heart and lungs. It supplies oxygen to the body and removes carbon dioxide. Follow these instructions at home: Lifestyle  If you are sick, stay home except to get medical care. Your health care provider will tell you how long to stay home. Call your health care provider before you go for medical care.  Rest at home as told by your health care provider.  Do not use any products that contain nicotine or tobacco, such as cigarettes, e-cigarettes, and chewing tobacco. If you need help quitting, ask your health care provider.  Return to your normal activities as told by your health care provider. Ask your health care provider what activities are safe for you. General instructions  Take over-the-counter and prescription medicines only as told by your health care provider.  Drink enough fluid to keep your urine pale yellow.  Keep all follow-up visits as told by your health care provider. This is important. How is this prevented?  There is no vaccine to help prevent COVID-19 infection. However, there are steps you can take  to protect yourself and others from this virus. To protect yourself:   Do not travel to areas where COVID-19 is a risk. The areas where COVID-19 is reported change often. To identify high-risk areas and travel restrictions, check the CDC travel website: FatFares.com.br  If you live in, or must travel to, an area where COVID-19 is a risk, take precautions to avoid infection. ? Stay away from people who are sick. ? Wash your hands often with soap and water for 20 seconds. If soap and water are not available, use an alcohol-based hand sanitizer. ? Avoid touching your mouth, face, eyes, or nose. ? Avoid going out in public, follow guidance from your state and local health authorities. ? If you must go out in public, wear a cloth face covering or face mask. Make sure your mask covers your nose and mouth. ? Avoid crowded indoor spaces. Stay at least 6 feet (2 meters)  away from others. ? Disinfect objects and surfaces that are frequently touched every day. This may include:  Counters and tables.  Doorknobs and light switches.  Sinks and faucets.  Electronics, such as phones, remote controls, keyboards, computers, and tablets. To protect others: If you have symptoms of COVID-19, take steps to prevent the virus from spreading to others.  If you think you have a COVID-19 infection, contact your health care provider right away. Tell your health care team that you think you may have a COVID-19 infection.  Stay home. Leave your house only to seek medical care. Do not use public transport.  Do not travel while you are sick.  Wash your hands often with soap and water for 20 seconds. If soap and water are not available, use alcohol-based hand sanitizer.  Stay away from other members of your household. Let healthy household members care for children and pets, if possible. If you have to care for children or pets, wash your hands often and wear a mask. If possible, stay in your own room, separate from others. Use a different bathroom.  Make sure that all people in your household wash their hands well and often.  Cough or sneeze into a tissue or your sleeve or elbow. Do not cough or sneeze into your hand or into the air.  Wear a cloth face covering or face mask. Make sure your mask covers your nose and mouth. Where to find more information  Centers for Disease Control and Prevention: PurpleGadgets.be  World Health Organization: https://www.castaneda.info/ Contact a health care provider if:  You live in or have traveled to an area where COVID-19 is a risk and you have symptoms of the infection.  You have had contact with someone who has COVID-19 and you have symptoms of the infection. Get help right away if:  You have trouble breathing.  You have pain or pressure in your chest.  You have confusion.  You have  bluish lips and fingernails.  You have difficulty waking from sleep.  You have symptoms that get worse. These symptoms may represent a serious problem that is an emergency. Do not wait to see if the symptoms will go away. Get medical help right away. Call your local emergency services (911 in the [place] Do not drive yourself to the hospital. Let the emergency medical personnel know if you think you have COVID-19. Summary  COVID-19 is a respiratory infection that is caused by a virus. It is also known as coronavirus disease or novel coronavirus. It can cause serious infections, such as pneumonia, acute respiratory  distress syndrome, acute respiratory failure, or sepsis.  The virus that causes COVID-19 is contagious. This means that it can spread from person to person through droplets from breathing, speaking, singing, coughing, or sneezing.  You are more likely to develop a serious illness if you are 70 years of age or older, have a weak immune system, live in a nursing home, or have chronic disease.  There is no medicine to treat COVID-19. Your health care provider will talk with you about ways to treat your symptoms.  Take steps to protect yourself and others from infection. Wash your hands often and disinfect objects and surfaces that are frequently touched every day. Stay away from people who are sick and wear a mask if you are sick. This information is not intended to replace advice given to you by your health care provider. Make sure you discuss any questions you have with your health care provider. Document Revised: 10/16/2019 Document Reviewed: 01/22/2019 Elsevier Patient Education  [place].

## 2020-08-08 DIAGNOSIS — U071 COVID-19: Secondary | ICD-10-CM | POA: Diagnosis not present

## 2020-08-08 DIAGNOSIS — Z20822 Contact with and (suspected) exposure to covid-19: Secondary | ICD-10-CM | POA: Diagnosis not present

## 2020-08-09 ENCOUNTER — Telehealth: Payer: Self-pay

## 2020-08-09 ENCOUNTER — Other Ambulatory Visit: Payer: Self-pay | Admitting: Physician Assistant

## 2020-08-09 DIAGNOSIS — N183 Chronic kidney disease, stage 3 unspecified: Secondary | ICD-10-CM

## 2020-08-09 DIAGNOSIS — U071 COVID-19: Secondary | ICD-10-CM

## 2020-08-09 MED ORDER — SODIUM CHLORIDE 0.9 % IV SOLN
1200.0000 mg | Freq: Once | INTRAVENOUS | Status: AC
  Administered 2020-08-10: 1200 mg via INTRAVENOUS
  Filled 2020-08-09: qty 1200

## 2020-08-09 NOTE — Telephone Encounter (Signed)
Patient had a televisit with you on Friday. She stated she was COVID positive at that time, but had not yet been tested. She tested yesterday and was positive. She stated you were going to call her in something to the pharmacy but you never did. An Rx for Mucinex and zinc were sent to the pharmacy and receipt was confirmed by pharmacy. She states she needed a "Z-Pac." She was irate, wanted to talk to a "doctor." I told her that I would send a message to the provider and someone would call her back with any recommendations. She did cough a couple of times during the conversation, but was more of a tickle cough and not a productive cough. She did not demonstrate SOB, based on her ability to angrily express her needs and my inability to talk.   She did not want to talk to a "foreigner, that she was 86 years old and couldn't understand a thing she said."  She wanted to talk to someone that spoke [place] "like me."   She stated her husband was very sick, had been sick for a week, but that he stayed in bed all the time and wouldn't do anything about it.

## 2020-08-09 NOTE — Progress Notes (Signed)
I connected by phone with Faith Edwards on 08/09/2020 at 12:03 PM to discuss the potential use of a new treatment for mild to moderate COVID-19 viral infection in non-hospitalized patients.  This patient is a 86 y.o. female that meets the FDA criteria for Emergency Use Authorization of COVID monoclonal antibody casirivimab/imdevimab.  Has a (+) direct SARS-CoV-2 viral test result  Has mild or moderate COVID-19   Is NOT hospitalized due to COVID-19  Is within 10 days of symptom onset  Has at least one of the high risk factor(s) for progression to severe COVID-19 and/or hospitalization as defined in EUA.  Specific high risk criteria : Older age (>/= 84 yo), CKD stage III   I have spoken and communicated the following to the patient or parent/caregiver regarding COVID monoclonal antibody treatment:  1. FDA has authorized the emergency use for the treatment of mild to moderate COVID-19 in adults and pediatric patients with positive results of direct SARS-CoV-2 viral testing who are 65 years of age and older weighing at least 40 kg, and who are at high risk for progressing to severe COVID-19 and/or hospitalization.  2. The significant known and potential risks and benefits of COVID monoclonal antibody, and the extent to which such potential risks and benefits are unknown.  3. Information on available alternative treatments and the risks and benefits of those alternatives, including clinical trials.  4. Patients treated with COVID monoclonal antibody should continue to self-isolate and use infection control measures (e.g., wear mask, isolate, social distance, avoid sharing personal items, clean and disinfect "high touch" surfaces, and frequent handwashing) according to CDC guidelines.   5. The patient or parent/caregiver has the option to accept or refuse COVID monoclonal antibody treatment.  After reviewing this information with the patient, The patient agreed to proceed with receiving  casirivimab\imdevimab infusion and will be provided a copy of the Fact sheet prior to receiving the infusion.  Sx onset 8/4. Set up for infusion on 8/11 @ 12:30pm. Directions given to [facility]. Pt is aware that insurance will be charged an infusion fee.   Of note, pt is fully vaccinated with [employer].   Angelena Form 08/09/2020 12:03 PM

## 2020-08-09 NOTE — Telephone Encounter (Signed)
I recommend evaluation in ED due to your  advance age and positive COVID-19 test.will need imaging.

## 2020-08-09 NOTE — Telephone Encounter (Signed)
Called patient to let her know your recommendation. She stated she had called her husband's cardiologist, Dr. Einar Gip, who is also a friend, and he set both her and her husband up for infusion therapy at [facility] tomorrow.

## 2020-08-10 ENCOUNTER — Ambulatory Visit (HOSPITAL_COMMUNITY)
Admission: RE | Admit: 2020-08-10 | Discharge: 2020-08-10 | Disposition: A | Discharge status: Home / Self Care | Payer: Medicare Other | Source: Ambulatory Visit | Attending: Pulmonary Disease | Admitting: Pulmonary Disease

## 2020-08-10 DIAGNOSIS — N183 Chronic kidney disease, stage 3 unspecified: Secondary | ICD-10-CM | POA: Insufficient documentation

## 2020-08-10 DIAGNOSIS — R54 Age-related physical debility: Secondary | ICD-10-CM | POA: Insufficient documentation

## 2020-08-10 DIAGNOSIS — Z23 Encounter for immunization: Secondary | ICD-10-CM | POA: Diagnosis not present

## 2020-08-10 DIAGNOSIS — U071 COVID-19: Secondary | ICD-10-CM | POA: Diagnosis not present

## 2020-08-10 MED ORDER — ALBUTEROL SULFATE HFA 108 (90 BASE) MCG/ACT IN AERS: 2.0000 | INHALATION_SPRAY | Freq: Once | RESPIRATORY_TRACT | Status: DC | PRN

## 2020-08-10 MED ORDER — SODIUM CHLORIDE 0.9 % IV SOLN: INTRAVENOUS | Status: DC | PRN

## 2020-08-10 MED ORDER — ONDANSETRON HCL 4 MG/2ML IJ SOLN: 4.0000 mg | Freq: Once | INTRAMUSCULAR | Status: DC

## 2020-08-10 MED ORDER — FAMOTIDINE IN NACL 20-0.9 MG/50ML-% IV SOLN: 20.0000 mg | Freq: Once | INTRAVENOUS | Status: DC | PRN

## 2020-08-10 MED ORDER — METHYLPREDNISOLONE SODIUM SUCC 125 MG IJ SOLR: 125.0000 mg | Freq: Once | INTRAMUSCULAR | Status: DC | PRN

## 2020-08-10 MED ORDER — EPINEPHRINE 0.3 MG/0.3ML IJ SOAJ: 0.3000 mg | Freq: Once | INTRAMUSCULAR | Status: DC | PRN

## 2020-08-10 MED ORDER — DIPHENHYDRAMINE HCL 50 MG/ML IJ SOLN: 50.0000 mg | Freq: Once | INTRAMUSCULAR | Status: DC | PRN

## 2020-08-10 NOTE — Progress Notes (Signed)
  Diagnosis: COVID-19  Physician:Dr Wright  Procedure: [facility] Infusion Clinic Med: casirivimab\imdevimab infusion - Provided patient with casirivimab\imdevimab fact sheet for patients, parents and caregivers prior to infusion.  Complications: No immediate complications noted.  Discharge: Discharged home   Faith Edwards 08/10/2020  

## 2020-08-10 NOTE — Discharge Instructions (Signed)

## 2020-08-17 DIAGNOSIS — Z20822 Contact with and (suspected) exposure to covid-19: Secondary | ICD-10-CM | POA: Diagnosis not present

## 2020-08-17 DIAGNOSIS — Z03818 Encounter for observation for suspected exposure to other biological agents ruled out: Secondary | ICD-10-CM | POA: Diagnosis not present

## 2020-10-07 ENCOUNTER — Other Ambulatory Visit: Payer: Self-pay | Admitting: Internal Medicine

## 2020-10-14 DIAGNOSIS — D485 Neoplasm of uncertain behavior of skin: Secondary | ICD-10-CM | POA: Diagnosis not present

## 2020-10-14 DIAGNOSIS — L821 Other seborrheic keratosis: Secondary | ICD-10-CM | POA: Diagnosis not present

## 2020-10-14 DIAGNOSIS — D692 Other nonthrombocytopenic purpura: Secondary | ICD-10-CM | POA: Diagnosis not present

## 2020-10-14 DIAGNOSIS — L820 Inflamed seborrheic keratosis: Secondary | ICD-10-CM | POA: Diagnosis not present

## 2020-10-20 ENCOUNTER — Other Ambulatory Visit: Payer: Self-pay

## 2020-10-20 ENCOUNTER — Other Ambulatory Visit: Payer: Medicare Other

## 2020-10-20 DIAGNOSIS — E7849 Other hyperlipidemia: Secondary | ICD-10-CM | POA: Diagnosis not present

## 2020-10-20 DIAGNOSIS — E0500 Thyrotoxicosis with diffuse goiter without thyrotoxic crisis or storm: Secondary | ICD-10-CM | POA: Diagnosis not present

## 2020-10-20 DIAGNOSIS — N1831 Chronic kidney disease, stage 3a: Secondary | ICD-10-CM | POA: Diagnosis not present

## 2020-10-20 DIAGNOSIS — R7303 Prediabetes: Secondary | ICD-10-CM

## 2020-10-21 LAB — COMPLETE METABOLIC PANEL WITH GFR
AG Ratio: 1.9 (calc) (ref 1.0–2.5)
ALT: 14 U/L (ref 6–29)
AST: 18 U/L (ref 10–35)
Albumin: 4.4 g/dL (ref 3.6–5.1)
Alkaline phosphatase (APISO): 63 U/L (ref 37–153)
BUN/Creatinine Ratio: 25 (calc) — ABNORMAL HIGH (ref 6–22)
BUN: 25 mg/dL (ref 7–25)
CO2: 26 mmol/L (ref 20–32)
Calcium: 9.7 mg/dL (ref 8.6–10.4)
Chloride: 105 mmol/L (ref 98–110)
Creat: 1.02 mg/dL — ABNORMAL HIGH (ref 0.60–0.88)
GFR, Est African American: 58 mL/min/{1.73_m2} — ABNORMAL LOW (ref >=60)
GFR, Est Non African American: 50 mL/min/{1.73_m2} — ABNORMAL LOW (ref >=60)
Globulin: 2.3 g/dL (calc) (ref 1.9–3.7)
Glucose, Bld: 107 mg/dL — ABNORMAL HIGH (ref 65–99)
Potassium: 5.1 mmol/L (ref 3.5–5.3)
Sodium: 140 mmol/L (ref 135–146)
Total Bilirubin: 1.1 mg/dL (ref 0.2–1.2)
Total Protein: 6.7 g/dL (ref 6.1–8.1)

## 2020-10-21 LAB — CBC WITH DIFFERENTIAL/PLATELET
Absolute Monocytes: 659 cells/uL (ref 200–950)
Basophils Absolute: 38 cells/uL (ref 0–200)
Basophils Relative: 0.6 %
Eosinophils Absolute: 166 cells/uL (ref 15–500)
Eosinophils Relative: 2.6 %
HCT: 40.6 % (ref 35.0–45.0)
Hemoglobin: 13.4 g/dL (ref 11.7–15.5)
Lymphs Abs: 2342 cells/uL (ref 850–3900)
MCH: 31.7 pg (ref 27.0–33.0)
MCHC: 33 g/dL (ref 32.0–36.0)
MCV: 96 fL (ref 80.0–100.0)
MPV: 11.1 fL (ref 7.5–12.5)
Monocytes Relative: 10.3 %
Neutro Abs: 3194 cells/uL (ref 1500–7800)
Neutrophils Relative %: 49.9 %
Platelets: 267 10*3/uL (ref 140–400)
RBC: 4.23 10*6/uL (ref 3.80–5.10)
RDW: 12.2 % (ref 11.0–15.0)
Total Lymphocyte: 36.6 %
WBC: 6.4 10*3/uL (ref 3.8–10.8)

## 2020-10-21 LAB — LIPID PANEL
Cholesterol: 177 mg/dL (ref <200)
HDL: 76 mg/dL (ref >=50)
LDL Cholesterol (Calc): 85 mg/dL (calc)
Non-HDL Cholesterol (Calc): 101 mg/dL (calc) (ref <130)
Total CHOL/HDL Ratio: 2.3 (calc) (ref <5.0)
Triglycerides: 70 mg/dL (ref <150)

## 2020-10-21 LAB — HEMOGLOBIN A1C
Hgb A1c MFr Bld: 5.4 % of total Hgb (ref <5.7)
Mean Plasma Glucose: 108 (calc)
eAG (mmol/L): 6 (calc)

## 2020-10-21 LAB — TSH: TSH: 4.63 mIU/L — ABNORMAL HIGH (ref 0.40–4.50)

## 2020-10-21 NOTE — Progress Notes (Signed)
Thyroid is just slightly abnormal. Cholesterol is good, kidneys are stable, electrolytes are normal and sugar average has improved from last year.   We'll review at her visit.

## 2020-10-24 ENCOUNTER — Ambulatory Visit (INDEPENDENT_AMBULATORY_CARE_PROVIDER_SITE_OTHER): Payer: Medicare Other | Admitting: Internal Medicine

## 2020-10-24 ENCOUNTER — Encounter: Payer: Self-pay | Admitting: Internal Medicine

## 2020-10-24 ENCOUNTER — Other Ambulatory Visit: Payer: Self-pay

## 2020-10-24 VITALS — BP 122/78 | HR 68 | Temp 97.3°F | Ht 66.0 in | Wt 153.6 lb

## 2020-10-24 DIAGNOSIS — E0500 Thyrotoxicosis with diffuse goiter without thyrotoxic crisis or storm: Secondary | ICD-10-CM | POA: Diagnosis not present

## 2020-10-24 DIAGNOSIS — Z86718 Personal history of other venous thrombosis and embolism: Secondary | ICD-10-CM | POA: Diagnosis not present

## 2020-10-24 DIAGNOSIS — Z23 Encounter for immunization: Secondary | ICD-10-CM

## 2020-10-24 DIAGNOSIS — R0609 Other forms of dyspnea: Secondary | ICD-10-CM

## 2020-10-24 DIAGNOSIS — K921 Melena: Secondary | ICD-10-CM

## 2020-10-24 DIAGNOSIS — R0600 Dyspnea, unspecified: Secondary | ICD-10-CM

## 2020-10-24 DIAGNOSIS — M8589 Other specified disorders of bone density and structure, multiple sites: Secondary | ICD-10-CM | POA: Diagnosis not present

## 2020-10-24 DIAGNOSIS — Z8616 Personal history of COVID-19: Secondary | ICD-10-CM | POA: Diagnosis not present

## 2020-10-24 NOTE — Progress Notes (Signed)
Location:  [facility] clinic Provider:  Loza Prell L. Mariea Clonts, D.O., C.M.D.  Goals of Care:  Advanced Directives 10/24/2020  Does Patient Have a Medical Advance Directive? No  Does patient want to make changes to medical advance directive? No - Patient declined  Would patient like information on creating a medical advance directive? -     Chief Complaint  Patient presents with  . Medical Management of Chronic Issues    4 month follow up  . Health Maintenance    Influenza , eye exam   . Acute Visit    Prolia injection question has not had it lately , colonscopy referral stools are black and smaller than ever, diarrhea once a week x 6 or months. patient stated she eats bluberries  everyday     HPI: Patient is a 86 y.o. female seen today for medical management of chronic diseases.    Got covid when they went to a reunion for her husband in [place].  Had infusion.  They both have persistent fatigue.  Her husband still cannot taste.  She's glad she had her covid vaccines.  Took zinc, drank lots of water, kept moving--walked her miles.    She had her previous prolia with Dr. Quay Burow and it was supposed to be   She has tarry, small stools, weight loss of 20 lbs from spring.  She's seen Dr. Hildred Laser is 12/2.  Says that is far away.  She had a colonoscopy 2 yrs ago with him that was ok.   She does eat blueberries daily.  She is also not anemic.  She does have a lot of gas.    TSH was just mildly elevated.  She's had Grave's disease.  She's not been on treatment for years.    She did have several clots when she was 86 yo.    Past Medical History:  Diagnosis Date  . Anxiety   . Arthritis   . Asthma   . Bronchitis    hx of   . Chronic headaches    [facility]  . Cystitis    1954  . Depression   . Ear pain    bilat   . Hiatal hernia   . History of blood clots    Lungs, per [facility] new patient packet   . History of blood transfusion    2010  . Hx of blood clots 2007 & 2010   post  TKR  . Hyperthyroidism   . Pelvic fracture (HCC)    hx of in three places secondary to fall   . Pneumonia    hx of 1952  . Pulmonary embolism ([place]) 1993   on HRT  . Right wrist fracture    hx of 1995 has plate secondary to fall   . Shaking    hands bilat  . Shingles     per [facility] new patient packet   . Shortness of breath dyspnea   . Skin cancer    basal cell   . Spinal stenosis   . Trauma    left lower leg 10/22/2014   . Urinary frequency   . Urticaria     Past Surgical History:  Procedure Laterality Date  . ABDOMINAL HYSTERECTOMY  1969    per [facility] new patient packet   . ADENOIDECTOMY    . BREAST ENHANCEMENT SURGERY  1985  . CHOLECYSTECTOMY  1992    per [facility] new patient packet   . COLONOSCOPY     diverticulosis  . ESI  11/2013    C7-T1 ; Dr Nelva Bush  . EYE SURGERY     bilat cataract surgery   . INSERTION OF MESH N/A 07/08/2015   Procedure: INSERTION OF MESH;  Surgeon: Armandina Gemma, MD;  Location: [facility]S;  Service: General;  Laterality: N/A;  . REPLACEMENT TOTAL KNEE BILATERAL  2007 & 2010  . SP FACET INJECTION  01/19/14   RC2-3,3-4,C4-5  . TONSILLECTOMY  1945    per [facility] new patient packet   . VENTRAL HERNIA REPAIR N/A 07/08/2015   Procedure: VENTRAL ADULT REPAIR VENTRAL INCISIONAL HERNIA REPAIR;  Surgeon: Armandina Gemma, MD;  Location: [facility] ORS;  Service: General;  Laterality: N/A;  . WRIST FRACTURE SURGERY Bilateral 10  years and 2 years/  . WRIST SURGERY Left 06/2016   plate     Allergies  Allergen Reactions  . Penicillins Hives  . Sulfa Antibiotics Hives and Nausea And Vomiting    7-15    Outpatient Encounter Medications as of 10/24/2020  Medication Sig  . ascorbic acid (VITAMIN C) 500 MG tablet Take 500 mg by mouth daily.  Marland Kitchen aspirin EC 81 MG tablet Take 81 mg by mouth daily.  Marland Kitchen atorvastatin (LIPITOR) 20 MG tablet Take 20 mg by mouth daily.  . cholecalciferol (VITAMIN D) 1000 UNITS tablet Take 1,000 Units by mouth daily.  . Cyanocobalamin (B-12) 3000 MCG SUBL Place  1 tablet under the tongue daily.  Marland Kitchen denosumab (PROLIA) 60 MG/ML SOSY injection Inject 60 mg into the skin every 6 (six) months.  . DULoxetine (CYMBALTA) 30 MG capsule Take 30 mg by mouth daily.   . famotidine (PEPCID) 40 MG tablet TAKE 1 TABLET(40 MG) BY MOUTH AT BEDTIME  . Urea 40 % GEL Apply 1 application topically daily. Apply to left big toenail  . [DISCONTINUED] Fluticasone-Salmeterol (ADVAIR) 100-50 MCG/DOSE AEPB Inhale 1 puff into the lungs 2 (two) times daily.  . [DISCONTINUED] guaiFENesin (MUCINEX) 600 MG 12 hr tablet Take 1 tablet (600 mg total) by mouth 2 (two) times daily.  . [DISCONTINUED] polyethylene glycol (MIRALAX / GLYCOLAX) 17 g packet Take 17 g by mouth daily as needed.   No facility-administered encounter medications on file as of 10/24/2020.    Review of Systems:  Review of Systems  Constitutional: Positive for malaise/fatigue and weight loss. Negative for chills and fever.  HENT: Negative for hearing loss.   Eyes: Negative for blurred vision.  Respiratory: Positive for shortness of breath. Negative for cough, sputum production and wheezing.   Cardiovascular: Negative for chest pain, palpitations and leg swelling.  Gastrointestinal: Negative for abdominal pain, blood in stool, constipation and diarrhea.       Tarry stools   Genitourinary: Negative for dysuria.       Uses depends  Musculoskeletal: Negative for falls and joint pain.  Skin: Negative for itching and rash.  Neurological: Negative for dizziness and loss of consciousness.  Psychiatric/Behavioral: Positive for memory loss. Negative for depression. The patient is not nervous/anxious and does not have insomnia.     Health Maintenance  Topic Date Due  . DEXA SCAN  10/13/2021  . TETANUS/TDAP  10/10/2024  . INFLUENZA VACCINE  Completed  . COVID-19 Vaccine  Completed  . PNA vac Low Risk Adult  Completed    Physical Exam: Vitals:   10/24/20 1515  BP: 122/78  Pulse: 68  Temp: (!) 97.3 F (36.3 C)    TempSrc: Temporal  SpO2: 98%  Weight: 153 lb 9.6 oz (69.7 kg)  Height: 5\' 6"  (1.676 m)  Body mass index is 24.79 kg/m. Physical Exam Vitals reviewed.  Constitutional:      General: She is not in acute distress.    Appearance: Normal appearance. She is not toxic-appearing.  HENT:     Head: Normocephalic and atraumatic.  Cardiovascular:     Rate and Rhythm: Normal rate and regular rhythm.     Pulses: Normal pulses.     Heart sounds: Normal heart sounds.  Pulmonary:     Breath sounds: Normal breath sounds. No wheezing, rhonchi or rales.     Comments: Appears to have increased effort  Abdominal:     General: Bowel sounds are normal. There is no distension.     Palpations: Abdomen is soft. There is no mass.     Tenderness: There is no abdominal tenderness. There is no guarding or rebound.     Hernia: No hernia is present.  Genitourinary:    Rectum: Guaiac result negative.     Comments: Some external hemorrhoids present, soft brown stool present in rectal vault--heme negative Musculoskeletal:        General: Normal range of motion.  Skin:    General: Skin is warm and dry.  Neurological:     General: No focal deficit present.     Mental Status: She is alert and oriented to person, place, and time.     Motor: No weakness.     Gait: Gait normal.     Comments: Forgot from beginning of visit that blood pressure had been taken and flu shot administered  Psychiatric:        Mood and Affect: Mood normal.     Labs reviewed: Basic Metabolic Panel: Recent Labs    10/20/20 0816  NA 140  K 5.1  CL 105  CO2 26  GLUCOSE 107*  BUN 25  CREATININE 1.02*  CALCIUM 9.7  TSH 4.63*   Liver Function Tests: Recent Labs    10/20/20 0816  AST 18  ALT 14  BILITOT 1.1  PROT 6.7   No results for input(s): LIPASE, AMYLASE in the last 8760 hours. No results for input(s): AMMONIA in the last 8760 hours. CBC: Recent Labs    10/20/20 0816  WBC 6.4  NEUTROABS 3,194  HGB 13.4   HCT 40.6  MCV 96.0  PLT 267   Lipid Panel: Recent Labs    10/20/20 0816  CHOL 177  HDL 76  LDLCALC 85  TRIG 70  CHOLHDL 2.3   Lab Results  Component Value Date   HGBA1C 5.4 10/20/2020    Procedures since last visit: No results found.  Assessment/Plan 1. Black tarry stools -hemoccult negative today and stool did not appear this way this afternoon -is for f/u with Dr. Fuller Plan -had cscope 2 yrs ago -also had weight loss though reportedly intentional  2. Graves disease -f/u with Dr. Clement Husbands tsh just slightly high but she's apparently been advised to return annually  3. Osteopenia of multiple sites -had been on prolia through Dr. Quay Burow' office previously and was to get it authorized after she established here but has not heard yet -bone density was in 10/20  4. Dyspnea on exertion -since covid, sats wnl, cont to monitor, cont walking  5. History of COVID-19 -with residual dyspnea -advised to get covid booster next month (had infusion so needs 3 month wait)  6. Personal history of venous thrombosis and embolism -had previously and now had covid and has some degree of persistent dyspnea and fatigue since  7. Need for influenza  vaccination - Flu Vaccine QUAD High Dose(Fluad)  Labs/tests ordered:   F/u with Dr Fuller Plan and Dr. Cruzita Lederer  Next appt:  4 mos med mgt   Faith Edwards L. Dellia Donnelly, D.O. [facility] [address], [place] [address] Cell Phone (Mon-Fri 8am-5pm):  [phone] On Call:  [phone] & follow prompts after 5pm & weekends Office Phone:  [phone] Office Fax:  [phone]

## 2020-10-24 NOTE — Patient Instructions (Addendum)
Please f/u with Dr. Cruzita Lederer regarding your Grave's disease/thyroid.    Your stool was negative for blood on today's exam.  Do keep your appt with Dr. Fuller Plan.

## 2020-10-26 ENCOUNTER — Telehealth: Payer: Self-pay | Admitting: *Deleted

## 2020-10-26 NOTE — Telephone Encounter (Signed)
Patient called upset because she could not get a copy of her labwork. Stated that she was told to look on MyChart. She stated that she does not deal with MyChart and she wants a copy mailed to her address.   Printed off labs and mailed to her home.

## 2020-11-07 ENCOUNTER — Ambulatory Visit (INDEPENDENT_AMBULATORY_CARE_PROVIDER_SITE_OTHER): Payer: Medicare Other

## 2020-11-07 ENCOUNTER — Other Ambulatory Visit: Payer: Self-pay

## 2020-11-07 DIAGNOSIS — M8589 Other specified disorders of bone density and structure, multiple sites: Secondary | ICD-10-CM | POA: Diagnosis not present

## 2020-11-07 MED ORDER — DENOSUMAB 60 MG/ML ~~LOC~~ SOSY
60.0000 mg | PREFILLED_SYRINGE | Freq: Once | SUBCUTANEOUS | Status: AC
  Administered 2020-11-07: 60 mg via SUBCUTANEOUS

## 2020-11-14 DIAGNOSIS — F064 Anxiety disorder due to known physiological condition: Secondary | ICD-10-CM | POA: Diagnosis not present

## 2020-12-01 ENCOUNTER — Ambulatory Visit (INDEPENDENT_AMBULATORY_CARE_PROVIDER_SITE_OTHER): Payer: Medicare Other | Admitting: Gastroenterology

## 2020-12-01 ENCOUNTER — Encounter: Payer: Self-pay | Admitting: Gastroenterology

## 2020-12-01 VITALS — BP 100/60 | HR 92 | Ht 64.37 in | Wt 155.2 lb

## 2020-12-01 DIAGNOSIS — R195 Other fecal abnormalities: Secondary | ICD-10-CM

## 2020-12-01 DIAGNOSIS — K219 Gastro-esophageal reflux disease without esophagitis: Secondary | ICD-10-CM

## 2020-12-01 MED ORDER — FAMOTIDINE 40 MG PO TABS: ORAL_TABLET | ORAL | 11 refills | Status: DC

## 2020-12-01 NOTE — Progress Notes (Signed)
    History of Present Illness: This is an 86 year old female with loose stools, gas, bloating, dark stools.  Her current diet has high intake of raw fruits and vegetables each day.  She has blueberries daily.  Her stool is dark but not bloody or tarry.  Her stool has been looser once or twice daily since she began eating more fruits and vegetables.  She gained about 20 pounds after a foot injury and has intentionally lost the 20 pounds she previously gained.  Her weight is now stable.  She relates her reflux symptoms are well controlled on daily famotidine.  She notes fatigue and occasional dizziness since recovering from Covid.  CBC, CMP in October were normal. Stool heme negative in October. Colonoscopy and EGD performed in 09/2018 showed scattered diverticulosis and a medium sized hiatal hernia.    Current Medications, Allergies, Past Medical History, Past Surgical History, Family History and Social History were reviewed in [employer] record.   Physical Exam: General: Well developed, well nourished, elderly, no acute distress Head: Normocephalic and atraumatic Eyes:  sclerae anicteric, EOMI Ears: Normal auditory acuity Mouth: Not examined, mask on during Covid-19 pandemic Lungs: Clear throughout to auscultation Heart: Regular rate and rhythm; no murmurs, rubs or bruits Abdomen: Soft, non tender and non distended. No masses, hepatosplenomegaly or hernias noted. Normal Bowel sounds Rectal: No lesions, no tenderness, soft brown, heme negative stool Musculoskeletal: Symmetrical with no gross deformities  Pulses:  Normal pulses noted Extremities: No clubbing, cyanosis, edema or deformities noted Neurological: Alert oriented x 4, grossly nonfocal Psychological:  Alert and cooperative. Normal mood and affect   Assessment and Recommendations:  1. Darker stools, looser stools, mild bloating, gas.  She is reassured that her stool changes are likely diet related and today  she has an unremarkable physical exam, Hemoccult negative stool, normal labs and a colonoscopy 2 years ago that was unremarkable. Begin Gas-X tid prn. Consider adjusting diet. Follow-up with PCP for ongoing care. GI follow up prn.   2.  GERD with a moderate sized hiatal hernia.  Long-term antireflux measures.  Continue famotidine 40 mg daily. Follow up with PCP for ongoing care. GI follow up prn.

## 2020-12-01 NOTE — Patient Instructions (Signed)
We have sent the following medications to your pharmacy for you to pick up at your convenience: famotidine.   Thank you for choosing me and [facility].  Pricilla Riffle. Dagoberto Ligas., MD., Marval Regal

## 2020-12-02 ENCOUNTER — Other Ambulatory Visit: Payer: Self-pay | Admitting: Gastroenterology

## 2020-12-12 DIAGNOSIS — Z23 Encounter for immunization: Secondary | ICD-10-CM | POA: Diagnosis not present

## 2021-01-05 ENCOUNTER — Other Ambulatory Visit: Payer: Self-pay

## 2021-01-05 ENCOUNTER — Ambulatory Visit (INDEPENDENT_AMBULATORY_CARE_PROVIDER_SITE_OTHER): Payer: Medicare Other | Admitting: Internal Medicine

## 2021-01-05 ENCOUNTER — Encounter: Payer: Self-pay | Admitting: Internal Medicine

## 2021-01-05 VITALS — BP 122/78 | HR 65 | Temp 97.5°F | Ht 64.0 in | Wt 156.0 lb

## 2021-01-05 DIAGNOSIS — R739 Hyperglycemia, unspecified: Secondary | ICD-10-CM

## 2021-01-05 DIAGNOSIS — R3589 Other polyuria: Secondary | ICD-10-CM

## 2021-01-05 DIAGNOSIS — R632 Polyphagia: Secondary | ICD-10-CM

## 2021-01-05 DIAGNOSIS — R350 Frequency of micturition: Secondary | ICD-10-CM

## 2021-01-05 MED ORDER — ATORVASTATIN CALCIUM 20 MG PO TABS: 20.0000 mg | ORAL_TABLET | Freq: Every day | ORAL | 3 refills | Status: DC

## 2021-01-05 NOTE — Patient Instructions (Signed)
We checked your sugar average again today and we'll let you know how that turns out.

## 2021-01-05 NOTE — Progress Notes (Signed)
Location:  [facility] clinic Provider: Paulene Tayag L. Mariea Clonts, D.O., C.M.D.  Goals of Care:  Advanced Directives 01/05/2021  Does Patient Have a Medical Advance Directive? No  Does patient want to make changes to medical advance directive? -  Would patient like information on creating a medical advance directive? No - Patient declined;Yes (Inpatient - patient requests chaplain consult to create a medical advance directive)     Chief Complaint  Patient presents with  . Acute Visit    Patient wants to be tested for diabetes, she states that  her symptoms are she's extremely thirsty, hungry, tired and losing weight. She is also on the KETO diet.     HPI: Patient is a 87 y.o. female seen today for an acute visit for wanting to be tested for diabetes due to polyuria, polyphagia, fatigue, weight loss.  Her last couple of glucose values have been 101 and 107 and hba1c back in October of last year was in range at 5.4.  She is not eating sugar and is on the keto diet b/c of gaining weight when on it.  Has lost 20 lbs and is right where she wants to be now.  She's staying in the 150s.    She'd had dark stools before and saw Dr. Fuller Plan and he said she was fine in 2019 so couldn't be colon cancer that quickly (per pt).    They eat a lot of raw vegetables and she does not use dressing.    Is using two depends in the day and two at night b/c she's urinating all of the time.  No dysuria.  No suprapubic tenderness.  It's got a little yellow to it, but no hematuria.  She has had frequency for years and does not want surgery on her bladder.    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  She take cymbalta.    Tells me about scarring of her lungs from prior massive pulmonary emboli in the '80s.    Does not sleep well.  Is coughing, peeing, etc, says it's no wonder.  She does take melatonin.      Past Medical History:  Diagnosis Date  . Anxiety   . Arthritis   . Asthma   . Bronchitis    hx of   . Chronic  headaches    [facility]  . COVID-19   . Cystitis    1954  . Depression   . Ear pain    bilat   . Hiatal hernia   . History of blood clots    Lungs, per [facility] new patient packet   . History of blood transfusion    2010  . Hx of blood clots 2007 & 2010   post TKR  . Hyperthyroidism   . Pelvic fracture (HCC)    hx of in three places secondary to fall   . Pneumonia    hx of 1952  . Pulmonary embolism ([place]) 1993   on HRT  . Right wrist fracture    hx of 1995 has plate secondary to fall   . Shaking    hands bilat  . Shingles     per [facility] new patient packet   . Shortness of breath dyspnea   . Skin cancer    basal cell   . Spinal stenosis   . Trauma    left lower leg 10/22/2014   . Urinary frequency   . Urticaria     Past Surgical History:  Procedure Laterality Date  . ABDOMINAL HYSTERECTOMY  1969    per [facility] new patient packet   . ADENOIDECTOMY    . BREAST ENHANCEMENT SURGERY  1985  . CHOLECYSTECTOMY  1992    per [facility] new patient packet   . COLONOSCOPY     diverticulosis  . ESI   11/2013    C7-T1 ; Dr Nelva Bush  . EYE SURGERY     bilat cataract surgery   . INSERTION OF MESH N/A 07/08/2015   Procedure: INSERTION OF MESH;  Surgeon: Armandina Gemma, MD;  Location: [facility];  Service: General;  Laterality: N/A;  . REPLACEMENT TOTAL KNEE BILATERAL  2007 & 2010  . SP FACET INJECTION  01/19/14   RC2-3,3-4,C4-5  . TONSILLECTOMY  1945    per [facility] new patient packet   . VENTRAL HERNIA REPAIR N/A 07/08/2015   Procedure: VENTRAL ADULT REPAIR VENTRAL INCISIONAL HERNIA REPAIR;  Surgeon: Armandina Gemma, MD;  Location: [facility];  Service: General;  Laterality: N/A;  . WRIST FRACTURE SURGERY Bilateral 10  years and 2 years/  . WRIST SURGERY Left 06/2016   plate     Allergies  Allergen Reactions  . Penicillins Hives  . Sulfa Antibiotics Hives and Nausea And Vomiting    7-15    Outpatient Encounter Medications as of 01/05/2021  Medication Sig  . ascorbic acid (VITAMIN C) 500 MG tablet Take  500 mg by mouth daily.  Marland Kitchen aspirin EC 81 MG tablet Take 81 mg by mouth daily.  Marland Kitchen atorvastatin (LIPITOR) 20 MG tablet Take 1 tablet (20 mg total) by mouth daily.  . cholecalciferol (VITAMIN D) 1000 UNITS tablet Take 1,000 Units by mouth daily.  . Cyanocobalamin (B-12) 3000 MCG SUBL Place 1 tablet under the tongue daily.  Marland Kitchen denosumab (PROLIA) 60 MG/ML SOSY injection Inject 60 mg into the skin every 6 (six) months.  . DULoxetine (CYMBALTA) 30 MG capsule Take 30 mg by mouth daily.   . famotidine (PEPCID) 40 MG tablet TAKE 1 TABLET(40 MG) BY MOUTH AT BEDTIME  . Urea 40 % GEL Apply 1 application topically daily. Apply to left big toenail  . [DISCONTINUED] atorvastatin (LIPITOR) 20 MG tablet Take 20 mg by mouth daily.  . [DISCONTINUED] Fluticasone-Salmeterol (ADVAIR) 100-50 MCG/DOSE AEPB Inhale 1 puff into the lungs 2 (two) times daily.   No facility-administered encounter medications on file as of 01/05/2021.    Review of Systems:  Review of Systems  Constitutional: Positive for weight loss. Negative for chills and fever.  HENT: Negative for hearing loss.   Eyes: Negative for blurred vision.  Respiratory: Positive for shortness of breath. Negative for cough.   Cardiovascular: Negative for chest pain, palpitations and leg swelling.  Gastrointestinal: Negative for abdominal pain, blood in stool, constipation, diarrhea and melena.  Genitourinary: Positive for frequency and urgency. Negative for dysuria, flank pain and hematuria.  Musculoskeletal: Negative for falls and joint pain.  Skin: Negative for itching and rash.  Neurological: Negative for dizziness and loss of consciousness.  Endo/Heme/Allergies: Positive for polydipsia. Does not bruise/bleed easily.  Psychiatric/Behavioral: Negative for depression and memory loss. The patient is nervous/anxious and has insomnia.     Health Maintenance  Topic Date Due  . DEXA SCAN  10/13/2021  . TETANUS/TDAP  10/10/2024  . INFLUENZA VACCINE  Completed   . COVID-19 Vaccine  Completed  . PNA vac Low Risk Adult  Completed    Physical Exam: Vitals:   01/05/21 1443  Weight: 156 lb (70.8 kg)  Height: 5'  4" (1.626 m)   Body mass index is 26.78 kg/m. Physical Exam Vitals reviewed.  Constitutional:      General: She is not in acute distress.    Appearance: Normal appearance. She is not toxic-appearing.  HENT:     Head: Normocephalic and atraumatic.  Eyes:     Conjunctiva/sclera: Conjunctivae normal.     Pupils: Pupils are equal, round, and reactive to light.  Cardiovascular:     Rate and Rhythm: Normal rate and regular rhythm.  Pulmonary:     Effort: Pulmonary effort is normal.     Breath sounds: Normal breath sounds. No wheezing, rhonchi or rales.  Abdominal:     General: Bowel sounds are normal.  Musculoskeletal:        General: Normal range of motion.  Skin:    General: Skin is warm and dry.  Neurological:     General: No focal deficit present.     Mental Status: She is alert and oriented to person, place, and time.  Psychiatric:        Mood and Affect: Mood normal.        Behavior: Behavior normal.     Labs reviewed: Basic Metabolic Panel: Recent Labs    10/20/20 0816  NA 140  K 5.1  CL 105  CO2 26  GLUCOSE 107*  BUN 25  CREATININE 1.02*  CALCIUM 9.7  TSH 4.63*   Liver Function Tests: Recent Labs    10/20/20 0816  AST 18  ALT 14  BILITOT 1.1  PROT 6.7   No results for input(s): LIPASE, AMYLASE in the last 8760 hours. No results for input(s): AMMONIA in the last 8760 hours. CBC: Recent Labs    10/20/20 0816  WBC 6.4  NEUTROABS 3,194  HGB 13.4  HCT 40.6  MCV 96.0  PLT 267   Lipid Panel: Recent Labs    10/20/20 0816  CHOL 177  HDL 76  LDLCALC 85  TRIG 70  CHOLHDL 2.3   Lab Results  Component Value Date   HGBA1C 5.4 10/20/2020    Procedures since last visit: No results found.  Assessment/Plan 1. Hyperglycemia -x2 on labs (just over 100) - Hemoglobin A1c  2. Polyphagia -  reports this but weight stable and is on keto diet  - Hemoglobin A1c  3. Frequency of urination and polyuria -suspect actually due to overactive bladder and urge incontinence at advanced age; however, given elevated glucose on labs on 2 occasions more recently, will recheck hba1c (last done oct) - Hemoglobin A1c  Labs/tests ordered:   Lab Orders     Hemoglobin A1c  Next appt:  05/25/2021  Reighlyn Elmes L. Mitchell Iwanicki, D.O. [facility] [address], [place] [address] Cell Phone (Mon-Fri 8am-5pm):  [phone] On Call:  [phone] & follow prompts after 5pm & weekends Office Phone:  [phone] Office Fax:  [phone]

## 2021-01-06 LAB — HEMOGLOBIN A1C
Hgb A1c MFr Bld: 5.6 % of total Hgb (ref <5.7)
Mean Plasma Glucose: 114 mg/dL
eAG (mmol/L): 6.3 mmol/L

## 2021-01-06 NOTE — Progress Notes (Signed)
Sugar average has trended up a little bit since last check in October, but does remain in the normal range indicating she is not diabetic.  I suspect her urinary frequency and urgency are due to overactive bladder.  If she'd like, we can try her on myrbetriq 25mg  for this.  Ideally, we could give her samples.  If we don't have any, we could reach out to John to request some.

## 2021-01-11 ENCOUNTER — Telehealth: Payer: Self-pay | Admitting: Internal Medicine

## 2021-01-11 DIAGNOSIS — R0600 Dyspnea, unspecified: Secondary | ICD-10-CM

## 2021-01-11 DIAGNOSIS — R5383 Other fatigue: Secondary | ICD-10-CM

## 2021-01-11 DIAGNOSIS — R0609 Other forms of dyspnea: Secondary | ICD-10-CM

## 2021-01-11 NOTE — Telephone Encounter (Signed)
Pt called wanting referral to Dr Einar Gip for heart eval. Pts husband is current with Dr Einar Gip.  Ms Keithley states she has been a lil tired & heart seems to skip occasionally. pts brother has  History of heart attacks.  Please advise. Thanks,  Kathyrn Lass

## 2021-01-11 NOTE — Telephone Encounter (Signed)
Referral entered  

## 2021-01-25 ENCOUNTER — Encounter: Payer: Self-pay | Admitting: Cardiology

## 2021-01-25 ENCOUNTER — Other Ambulatory Visit: Payer: Self-pay

## 2021-01-25 ENCOUNTER — Ambulatory Visit: Payer: Medicare Other | Admitting: Cardiology

## 2021-01-25 VITALS — BP 125/64 | HR 96 | Temp 98.4°F | Ht 64.0 in | Wt 154.0 lb

## 2021-01-25 DIAGNOSIS — I209 Angina pectoris, unspecified: Secondary | ICD-10-CM | POA: Diagnosis not present

## 2021-01-25 DIAGNOSIS — Z86711 Personal history of pulmonary embolism: Secondary | ICD-10-CM | POA: Diagnosis not present

## 2021-01-25 DIAGNOSIS — D6859 Other primary thrombophilia: Secondary | ICD-10-CM

## 2021-01-25 DIAGNOSIS — K219 Gastro-esophageal reflux disease without esophagitis: Secondary | ICD-10-CM

## 2021-01-25 DIAGNOSIS — R0609 Other forms of dyspnea: Secondary | ICD-10-CM | POA: Diagnosis not present

## 2021-01-25 DIAGNOSIS — R0600 Dyspnea, unspecified: Secondary | ICD-10-CM

## 2021-01-25 DIAGNOSIS — R5383 Other fatigue: Secondary | ICD-10-CM | POA: Diagnosis not present

## 2021-01-25 MED ORDER — METOPROLOL SUCCINATE ER 25 MG PO TB24: 25.0000 mg | ORAL_TABLET | Freq: Every day | ORAL | 2 refills | Status: DC

## 2021-01-25 NOTE — Progress Notes (Signed)
Primary Physician/Referring:  Gayland Curry, DO  Patient ID: Faith Edwards, female    DOB: 05-07-1932, 87 y.o.   MRN: [id number]  Chief Complaint  Patient presents with  . Shortness of Breath  . New Patient (Initial Visit)   HPI:    Faith Edwards  is a 87 y.o. fairly active Caucasian female with history of pulmonary embolism in 1995 while on estrogen replacement therapy, again had DVT x2 after knee replacement and was not on estrogen replacement therapy, hiatal hernia, chronic cough, chronic mild dyspnea, referred to me for evaluation of fatigue that started since cold infection sometime in August 2021.  She received remdesivir and steroid therapy.  On questioning, she has been having back pain in the middle of the back when she goes for a walk on a daily basis.  Back pain comes on with exertion and is relieved with rest.  She also has mild associated shortness of breath.    Past Medical History:  Diagnosis Date  . Anxiety   . Arthritis   . Asthma   . Bronchitis    hx of   . Chronic headaches    [facility]  . COVID-19   . Cystitis    1954  . Depression   . Ear pain    bilat   . Hiatal hernia   . History of blood clots    Lungs, per [facility] new patient packet   . History of blood transfusion    2010  . Hx of blood clots 2007 & 2010   post TKR  . Hyperthyroidism   . Pelvic fracture (HCC)    hx of in three places secondary to fall   . Pneumonia    hx of 1952  . Pulmonary embolism ([employer]) 1993   on HRT  . Right wrist fracture    hx of 1995 has plate secondary to fall   . Shaking    hands bilat  . Shingles     per [facility] new patient packet   . Shortness of breath dyspnea   . Skin cancer    basal cell   . Spinal stenosis   . Trauma    left lower leg 10/22/2014   . Urinary frequency   . Urticaria    Past Surgical History:  Procedure Laterality Date  . ABDOMINAL HYSTERECTOMY  1969    per [facility] new patient packet   . ADENOIDECTOMY    . BREAST ENHANCEMENT SURGERY   1985  . CHOLECYSTECTOMY  1992    per [facility] new patient packet   . COLONOSCOPY     diverticulosis  . ESI   11/2013    C7-T1 ; Dr Nelva Bush  . EYE SURGERY     bilat cataract surgery   . INSERTION OF MESH N/A 07/08/2015   Procedure: INSERTION OF MESH;  Surgeon: Armandina Gemma, MD;  Location: [facility];  Service: General;  Laterality: N/A;  . REPLACEMENT TOTAL KNEE BILATERAL  2007 & 2010  . SP FACET INJECTION  01/19/14   RC2-3,3-4,C4-5  . TONSILLECTOMY  1945    per [facility] new patient packet   . VENTRAL HERNIA REPAIR N/A 07/08/2015   Procedure: VENTRAL ADULT REPAIR VENTRAL INCISIONAL HERNIA REPAIR;  Surgeon: Armandina Gemma, MD;  Location: [facility];  Service: General;  Laterality: N/A;  . WRIST FRACTURE SURGERY Bilateral 10  years and 2 years/  . WRIST SURGERY Left 06/2016   plate    Family History  Problem Relation Age of  Onset  . Allergies Brother   . Asthma Brother   . Clotting disorder Father        PTE X 2  . Skin cancer Father   . Heart failure Father        Per [facility] New Patient Packet   . Skin cancer Mother   . Pneumonia Mother        1/2 lung loss per [place] Patient Packet   . Sudden death Other        2 M aunts & 2 M uncles in 60s  . Heart attack Brother        > 55  . Bladder Cancer Brother   . Schizophrenia Son        Per [facility] New Patient Packet   . Lung disease Son        Per [facility] New Patient Packet   . Mental illness Son        suicide by hanging  . Diabetes Son   . Hepatitis C Son   . Diabetes Daughter   . Migraines Daughter   . Heart attack Daughter   . Breast cancer Daughter   . Stroke Neg Hx     Social History   Tobacco Use  . Smoking status: Never Smoker  . Smokeless tobacco: Never Used  Substance Use Topics  . Alcohol use: Yes    Alcohol/week: 4.0 standard drinks    Types: 4 Glasses of wine per week    Comment: minimal   Marital Status: Married  ROS  Review of Systems  Cardiovascular: Positive for chest pain, dyspnea on exertion and palpitations. Negative for leg  swelling.  Respiratory: Positive for cough (chronic).   Gastrointestinal: Negative for melena.   Objective  Blood pressure 125/64, pulse 96, temperature 98.4 F (36.9 C), height 5\' 4"  (1.626 m), weight 154 lb (69.9 kg).  Vitals with BMI 01/25/2021 01/05/2021 12/01/2020  Height 5\' 4"  5\' 4"  5' 4.37"  Weight 154 lbs 156 lbs 155 lbs 4 oz  BMI 26.42 94.17 40.81  Systolic 448 185 631  Diastolic 64 78 60  Pulse 96 65 92     Physical Exam Cardiovascular:     Rate and Rhythm: Normal rate and regular rhythm.     Pulses: Intact distal pulses.     Heart sounds: Normal heart sounds. No murmur heard. No gallop.      Comments: No leg edema, no JVD. Pulmonary:     Effort: Pulmonary effort is normal.     Breath sounds: Normal breath sounds.  Abdominal:     General: Bowel sounds are normal.     Palpations: Abdomen is soft.    Laboratory examination:   Recent Labs    10/20/20 0816  NA 140  K 5.1  CL 105  CO2 26  GLUCOSE 107*  BUN 25  CREATININE 1.02*  CALCIUM 9.7  GFRNONAA 50*  GFRAA 58*   CrCl cannot be calculated (Patient's most recent lab result is older than the maximum 21 days allowed.).  CMP Latest Ref Rng & Units 10/20/2020 10/21/2019 10/13/2019  Glucose 65 - 99 mg/dL 107(H) 101(H) 93  BUN 7 - 25 mg/dL 25 21 28(H)  Creatinine 0.60 - 0.88 mg/dL 1.02(H) 1.02 1.27(H)  Sodium 135 - 146 mmol/L 140 139 139  Potassium 3.5 - 5.3 mmol/L 5.1 4.2 4.8  Chloride 98 - 110 mmol/L 105 104 103  CO2 20 - 32 mmol/L 26 27 29   Calcium 8.6 - 10.4 mg/dL 9.7 9.1  9.7  Total Protein 6.1 - 8.1 g/dL 6.7 - 7.0  Total Bilirubin 0.2 - 1.2 mg/dL 1.1 - 0.8  Alkaline Phos 39 - 117 U/L - - 103  AST 10 - 35 U/L 18 - 19  ALT 6 - 29 U/L 14 - 15   CBC Latest Ref Rng & Units 10/20/2020 10/13/2019 09/11/2018  WBC 3.8 - 10.8 Thousand/uL 6.4 7.4 7.9  Hemoglobin 11.7 - 15.5 g/dL 13.4 13.1 13.0  Hematocrit 35.0 - 45.0 % 40.6 39.6 39.7  Platelets 140 - 400 Thousand/uL 267 283.0 286.0    Lipid Panel Recent  Labs    10/20/20 0816  CHOL 177  TRIG 70  LDLCALC 85  HDL 76  CHOLHDL 2.3    HEMOGLOBIN A1C Lab Results  Component Value Date   HGBA1C 5.6 01/05/2021   MPG 114 01/05/2021   TSH Recent Labs    10/20/20 0816  TSH 4.63*    Medications and allergies   Allergies  Allergen Reactions  . Penicillins Hives  . Sulfa Antibiotics Hives and Nausea And Vomiting    7-15     Outpatient Medications Prior to Visit  Medication Sig Dispense Refill  . ascorbic acid (VITAMIN C) 500 MG tablet Take 500 mg by mouth daily.    Marland Kitchen aspirin EC 81 MG tablet Take 81 mg by mouth daily.    Marland Kitchen atorvastatin (LIPITOR) 20 MG tablet Take 1 tablet (20 mg total) by mouth daily. 90 tablet 3  . cholecalciferol (VITAMIN D) 1000 UNITS tablet Take 1,000 Units by mouth daily.    . Cyanocobalamin (B-12) 3000 MCG SUBL Place 1 tablet under the tongue daily.    Marland Kitchen denosumab (PROLIA) 60 MG/ML SOSY injection Inject 60 mg into the skin every 6 (six) months.    . DULoxetine (CYMBALTA) 30 MG capsule Take 30 mg by mouth daily.   4  . famotidine (PEPCID) 40 MG tablet TAKE 1 TABLET(40 MG) BY MOUTH AT BEDTIME 30 tablet 11  . Urea 40 % GEL Apply 1 application topically daily. Apply to left big toenail 15 mL 0   No facility-administered medications prior to visit.   Radiology:   CT angio chest 07/29/2018:  No evidence of pulmonary emboli. Tiny nodules in the right lung along the major fissure measuring less than 5 mm. No follow-up needed if patient is low-risk (and has no known or suspected primary neoplasm). Non-contrast chest CT can be considered in 12 months if patient is high-risk. Aortic atherosclerosis.  Cardiac Studies:   Exercise stress test 06/09/2012: Patient exercised for 2 minutes and 39 seconds and achieved 4.6 METS.  Achieved 100% of MPHR.  No evidence of ischemia by EKG  EKG:     EKG 01/25/2021: Normal sinus rhythm at rate of 85 bpm, normal axis.  No evidence of ischemia. Low voltage -possible pulmonary  disease.    Assessment     ICD-10-CM   1. Angina pectoris (HCC)  I20.9 metoprolol succinate (TOPROL-XL) 25 MG 24 hr tablet    PCV ECHOCARDIOGRAM COMPLETE    PCV MYOCARDIAL PERFUSION WITH LEXISCAN  2. DOE (dyspnea on exertion)  R06.00 EKG 12-Lead    PCV ECHOCARDIOGRAM COMPLETE    PCV MYOCARDIAL PERFUSION WITH LEXISCAN    Pulse oximetry, overnight  3. Other fatigue  R53.83 EKG 12-Lead  4. Primary hypercoagulable state ([place])  D68.59   5. History of pulmonary embolism: 1995 on estrogen. DVT 2007, 2005 after knee arthroplasty Not on OCP  Z86.711   6. Gastroesophageal reflux disease without  esophagitis  K21.9      There are no discontinued medications.  Meds ordered this encounter  Medications  . metoprolol succinate (TOPROL-XL) 25 MG 24 hr tablet    Sig: Take 1 tablet (25 mg total) by mouth daily. Take with or immediately following a meal.    Dispense:  30 tablet    Refill:  2   Orders Placed This Encounter  Procedures  . Pulse oximetry, overnight    [facility]  . PCV MYOCARDIAL PERFUSION WITH LEXISCAN    Standing Status:   Future    Standing Expiration Date:   03/25/2021  . EKG 12-Lead  . PCV ECHOCARDIOGRAM COMPLETE    Standing Status:   Future    Standing Expiration Date:   01/25/2022    Recommendations:   CHASITI WADDINGTON is a 87 y.o. fairly active Caucasian female with history of pulmonary embolism in 1995 while on estrogen replacement therapy, again had DVT x2 after knee replacement and was not on estrogen replacement therapy, hiatal hernia, chronic cough, chronic mild dyspnea, referred to me for evaluation of fatigue that started since cold infection sometime in August 2021.  She received remdesivir and steroid therapy.  On questioning, she has been having back pain in the middle of the back when she goes for a walk on a daily basis.  Symptoms are very typical for angina pectoris.  For now in view of frequent episodes, I started her on metoprolol succinate 25 mg daily.  Will  obtain echocardiogram and also Lexiscan Myoview stress test.  Unable to exercise treadmill due to dyspnea and gait instability.  She has chronic cough and dyspnea on exertion which may also be contributed by hiatal hernia.  Would consider discontinuing Pepcid and switch her to omeprazole.  Chest pain could also be related to GERD.  I would like to see her back after the test and will make further recommendation.  Reviewed her external labs, normal lipids.  She is presently on a statin and tolerating atorvastatin well.  With regard to dyspnea on exertion, related to deconditioning but could be anginal equivalent as well.  I will obtain nocturnal oximetry in view of marked fatigue and daytime somnolence.  If all the tests are negative, could consider statin holiday to see whether her symptoms are improved.  TSH is mildly elevated but should not contribute to her significant fatigue.  HAPPY BIRTHDAY 89Y !!!   Adrian Prows, MD, [facility] 01/25/2021, 4:27 PM Office: [phone]

## 2021-01-30 ENCOUNTER — Encounter: Payer: Self-pay | Admitting: Internal Medicine

## 2021-01-30 ENCOUNTER — Ambulatory Visit (INDEPENDENT_AMBULATORY_CARE_PROVIDER_SITE_OTHER): Payer: Medicare Other | Admitting: Internal Medicine

## 2021-01-30 ENCOUNTER — Other Ambulatory Visit: Payer: Self-pay

## 2021-01-30 VITALS — BP 110/68 | HR 82 | Ht 65.0 in | Wt 156.8 lb

## 2021-01-30 DIAGNOSIS — E0500 Thyrotoxicosis with diffuse goiter without thyrotoxic crisis or storm: Secondary | ICD-10-CM

## 2021-01-30 DIAGNOSIS — R221 Localized swelling, mass and lump, neck: Secondary | ICD-10-CM | POA: Diagnosis not present

## 2021-01-30 DIAGNOSIS — E038 Other specified hypothyroidism: Secondary | ICD-10-CM | POA: Diagnosis not present

## 2021-01-30 DIAGNOSIS — I209 Angina pectoris, unspecified: Secondary | ICD-10-CM

## 2021-01-30 LAB — TSH: TSH: 5.86 u[IU]/mL — ABNORMAL HIGH (ref 0.35–4.50)

## 2021-01-30 LAB — T4, FREE: Free T4: 0.74 ng/dL (ref 0.60–1.60)

## 2021-01-30 LAB — T3, FREE: T3, Free: 3.1 pg/mL (ref 2.3–4.2)

## 2021-01-30 NOTE — Progress Notes (Unsigned)
Patient ID: Faith Edwards, female   DOB: 10/05/1932, 87 y.o.   MRN: [id number]  This visit occurred during the SARS-CoV-2 public health emergency.  Safety protocols were in place, including screening questions prior to the visit, additional usage of staff PPE, and extensive cleaning of exam room while observing appropriate contact time as indicated for disinfecting solutions.   HPI  Faith Edwards is a 87 y.o.-year-old female, returning for f/u for Graves ds. Last visit 2 years and 6 months ago.  She had COVID-19 in 07/2020.  At last visit, she complained of a right low cervical nodule-on palpation, this appeared to be a small cervical lymph node.  She is not bothered by this anymore.  Reviewed her TFTs - last TSH was slightly higher than normal: Lab Results  Component Value Date   TSH 4.63 (H) 10/20/2020   TSH 4.18 10/13/2019   TSH 2.32 07/07/2018   TSH 2.07 08/30/2017   TSH 2.01 08/30/2016   TSH 3.08 04/06/2016   TSH 2.20 01/04/2016   TSH 3.09 11/16/2015   TSH 0.04 (L) 09/29/2015   TSH 0.02 (L) 09/19/2015   FREET4 0.86 07/07/2018   FREET4 0.81 08/30/2017   FREET4 0.79 08/30/2016   FREET4 0.77 04/06/2016   FREET4 0.79 01/04/2016   FREET4 0.62 11/16/2015   FREET4 1.02 09/29/2015   FREET4 0.9 08/05/2008    Her TSI levels remain high: Lab Results  Component Value Date   TSI 442 (H) 07/07/2018   TSI 524 (H) 04/06/2016   TSI 531 (H) 09/29/2015   We diagnosed Graves' disease based on elevated TSI's and disease course.  We initially had her on methimazole, decreased to 2.5 mg daily.  She stopped this by herself in 02/2016.  She was also on atenolol in the past, which she stopped.  Pt denies: - feeling nodules in neck - hoarseness - dysphagia - choking - SOB with lying down  At last visit, she had cough and choking, but not recently.  She continues to have some fatigue, heat intolerance, hair loss.  Pt does have a FH of thyroid ds.: her mother. No FH of thyroid cancer. No  h/o radiation tx to head or neck.  No seaweed or kelp. No recent contrast studies. No herbal supplements. No Biotin use. No recent steroids use.   She has COPD.  She had a foot fracture last year>> gained weight. She has intentional weight loss of 20 pounds afterwards.  ROS: Constitutional: no weight gain/no weight loss, + see HPI Eyes: no blurry vision, no xerophthalmia ENT: no sore throat, + see HPI Cardiovascular: no CP/no SOB/no palpitations/no leg swelling Respiratory: no cough/no SOB/no wheezing Gastrointestinal: no N/no V/no D/no C/no acid reflux Musculoskeletal: no muscle aches/no joint aches Skin: no rashes, + hair loss Neurological: no tremors/no numbness/no tingling/no dizziness  I reviewed pt's medications, allergies, PMH, social hx, family hx, and changes were documented in the history of present illness. Otherwise, unchanged from my initial visit note.  Past Medical History:  Diagnosis Date  . Anxiety   . Arthritis   . Asthma   . Bronchitis    hx of   . Chronic headaches    [facility]  . COVID-19   . Cystitis    1954  . Depression   . Ear pain    bilat   . Hiatal hernia   . History of blood clots    Lungs, per [facility] new patient packet   . History of blood transfusion  2010  . Hx of blood clots 2007 & 2010   post TKR  . Hyperthyroidism   . Pelvic fracture (HCC)    hx of in three places secondary to fall   . Pneumonia    hx of 1952  . Pulmonary embolism ([place]) 1993   on HRT  . Right wrist fracture    hx of 1995 has plate secondary to fall   . Shaking    hands bilat  . Shingles     per [facility] new patient packet   . Shortness of breath dyspnea   . Skin cancer    basal cell   . Spinal stenosis   . Trauma    left lower leg 10/22/2014   . Urinary frequency   . Urticaria    Past Surgical History:  Procedure Laterality Date  . ABDOMINAL HYSTERECTOMY  1969    per [facility] new patient packet   . ADENOIDECTOMY    . BREAST ENHANCEMENT SURGERY  1985   . CHOLECYSTECTOMY  1992    per [facility] new patient packet   . COLONOSCOPY     diverticulosis  . ESI   11/2013    C7-T1 ; Dr Nelva Bush  . EYE SURGERY     bilat cataract surgery   . INSERTION OF MESH N/A 07/08/2015   Procedure: INSERTION OF MESH;  Surgeon: Armandina Gemma, MD;  Location: [facility];  Service: General;  Laterality: N/A;  . REPLACEMENT TOTAL KNEE BILATERAL  2007 & 2010  . SP FACET INJECTION  01/19/14   RC2-3,3-4,C4-5  . TONSILLECTOMY  1945    per [facility] new patient packet   . VENTRAL HERNIA REPAIR N/A 07/08/2015   Procedure: VENTRAL ADULT REPAIR VENTRAL INCISIONAL HERNIA REPAIR;  Surgeon: Armandina Gemma, MD;  Location: [facility];  Service: General;  Laterality: N/A;  . WRIST FRACTURE SURGERY Bilateral 10  years and 2 years/  . WRIST SURGERY Left 06/2016   plate    Social History   Socioeconomic History  . Marital status: Married    Spouse name: Not on file  . Number of children: 3  . Years of education: Not on file  . Highest education level: Not on file  Occupational History  . Occupation: retired  Tobacco Use  . Smoking status: Never Smoker  . Smokeless tobacco: Never Used  Vaping Use  . Vaping Use: Never used  Substance and Sexual Activity  . Alcohol use: Yes    Alcohol/week: 4.0 standard drinks    Types: 4 Glasses of wine per week    Comment: minimal  . Drug use: No  . Sexual activity: Not Currently  Other Topics Concern  . 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 No   Do you have difficulty preparing food or eating? No   Do you have difficulty managing your medications? No   Do you have difficulty managing your finances? No   Do you have difficulty affording your medications? No   Social Determinants of Radio broadcast assistant Strain: Not on file  Food Insecurity: Not on file  Transportation Needs: Not on file  Physical Activity: Not on file  Stress: Not on file  Social Connections: Not on file  Intimate Partner Violence: Not on file   Current Outpatient Medications on File Prior to Visit  Medication Sig Dispense Refill  . ascorbic acid (VITAMIN C) 500 MG tablet Take 500 mg by mouth daily.    Marland Kitchen aspirin EC 81 MG tablet Take 81 mg by mouth daily.    Marland Kitchen atorvastatin (LIPITOR) 20 MG tablet Take 1 tablet (20 mg total) by mouth daily. 90 tablet 3  . cholecalciferol (VITAMIN D) 1000 UNITS tablet Take 1,000 Units by mouth daily.    . Cyanocobalamin (B-12) 3000 MCG SUBL Place 1 tablet under the tongue daily.    Marland Kitchen denosumab (PROLIA) 60 MG/ML SOSY injection Inject 60 mg into the skin every 6 (six) months.    . DULoxetine (CYMBALTA) 30 MG capsule Take 30 mg by mouth daily.   4  . famotidine (PEPCID) 40 MG tablet TAKE 1 TABLET(40 MG) BY MOUTH AT BEDTIME 30 tablet 11  . metoprolol succinate (TOPROL-XL) 25 MG 24 hr tablet Take 1 tablet (25 mg total) by mouth daily. Take with or immediately following a meal. 30 tablet 2  . Urea 40 % GEL Apply 1 application topically daily. Apply to left big toenail 15 mL 0  . [DISCONTINUED] Fluticasone-Salmeterol (ADVAIR) 100-50 MCG/DOSE AEPB Inhale 1 puff into the lungs 2 (two) times daily.     No current facility-administered medications on file prior to visit.   Allergies  Allergen Reactions  . Penicillins Hives  . Sulfa Antibiotics Hives and  Nausea And Vomiting    7-15   Family History  Problem Relation Age of Onset  . Allergies Brother   . Asthma Brother   . Clotting disorder Father        PTE X 2  . Skin cancer Father   . Heart failure Father        Per [facility] New Patient Packet   . Skin cancer Mother   . Pneumonia Mother        1/2 lung loss per [place] Patient Packet   . Sudden death Other        2 M aunts & 2 M uncles in 10s  . Heart attack Brother        > 55  . Bladder Cancer Brother   . Schizophrenia Son        Per [facility] New Patient Packet   . Lung disease Son        Per [facility] New Patient Packet   . Mental illness Son        suicide by hanging  . Diabetes Son   . Hepatitis C Son   . Diabetes Daughter   . Migraines Daughter   . Heart attack Daughter   . Breast cancer Daughter   .  Stroke Neg Hx    PE: BP 110/68   Pulse 82   Ht 5\' 5"  (1.651 m)   Wt 156 lb 12.8 oz (71.1 kg)   SpO2 97%   BMI 26.09 kg/m  Body mass index is 26.09 kg/m. Wt Readings from Last 3 Encounters:  01/30/21 156 lb 12.8 oz (71.1 kg)  01/25/21 154 lb (69.9 kg)  01/05/21 156 lb (70.8 kg)   Constitutional: normal weight, in NAD Eyes: PERRLA, EOMI, no exophthalmos ENT: moist mucous membranes, no thyromegaly, no cervical lymphadenopathy Cardiovascular: RRR, No MRG Respiratory: CTA B Gastrointestinal: abdomen soft, NT, ND, BS+ Musculoskeletal: no deformities, strength intact in all 4 Skin: moist, warm, no rashes Neurological: + tremor with outstretched hands, DTR normal in all 4  ASSESSMENT: 1. Graves ds.  2.  Neck nodule  PLAN:  1. Patient with history of thyrotoxicosis, with high TSI levels, most likely related to Graves' disease. -She has been on methimazole in the past but she was able to stop this in 02/2016 with subsequent normal TFTs -She was lost for follow-up after our last visit and she now returns after 2.5 years, after  having a slightly high TSH in 09/2020.  We discussed that this could have been related to her  recent Covid infection. -She does not report thyrotoxic or hypothyroid symptoms other than mild fatigue, and also hair loss, which is not new.  She mentions that this is not too bothersome for her.  Her tremors are chronic. -We will repeat her TFTs today: TSH, free T3 and free T4 -We did discuss that if the TSH is only slightly high with normal free T4 and free T3, we do not absolutely need to start levothyroxine -However, if the TSH trends up or if she has overt hypothyroidism (abnormal TSH with abnormal free thyroid hormones), we will need to start a low-dose levothyroxine -We did discuss about how to take levothyroxine correctly in case we need to start: Every day, with water, at least 30 minutes before breakfast, separated by at least 4 hours from: - acid reflux medications - calcium - iron - multivitamins -I will see her back in 1 year, but sooner for repeat labs if needed  2.  Neck nodule -She had a right inferior cervical lymph node on palpation, but this was not worrisome. -We did not check a thyroid ultrasound at last visit but I advised her to let me know if the nodule continues to enlarge or does not disappear.  She did not contact me afterwards. -At today's visit, I do not feel any abnormality on palpation of neck.  Component     Latest Ref Rng & Units 01/30/2021  T4,Free(Direct)     0.60 - 1.60 ng/dL 0.74  Triiodothyronine,Free,Serum     2.3 - 4.2 pg/mL 3.1  TSH     0.35 - 4.50 uIU/mL 5.86 (H)   Mild subclinical hypothyroidism >> no intervention needed for now. Will repeat TFTs in 3 mo.  Philemon Kingdom, MD PhD [facility] Endocrinology

## 2021-01-30 NOTE — Patient Instructions (Signed)
Please stop at the lab.  Please come back for a follow-up appointment in 1 year.  

## 2021-01-31 ENCOUNTER — Ambulatory Visit: Payer: Medicare Other

## 2021-01-31 DIAGNOSIS — I209 Angina pectoris, unspecified: Secondary | ICD-10-CM

## 2021-01-31 DIAGNOSIS — R0600 Dyspnea, unspecified: Secondary | ICD-10-CM

## 2021-01-31 DIAGNOSIS — R0609 Other forms of dyspnea: Secondary | ICD-10-CM

## 2021-02-08 ENCOUNTER — Other Ambulatory Visit: Payer: Self-pay

## 2021-02-08 ENCOUNTER — Ambulatory Visit: Payer: Medicare Other

## 2021-02-08 ENCOUNTER — Other Ambulatory Visit: Payer: Medicare Other

## 2021-02-08 DIAGNOSIS — R0600 Dyspnea, unspecified: Secondary | ICD-10-CM

## 2021-02-08 DIAGNOSIS — I209 Angina pectoris, unspecified: Secondary | ICD-10-CM | POA: Diagnosis not present

## 2021-02-08 DIAGNOSIS — R0609 Other forms of dyspnea: Secondary | ICD-10-CM | POA: Diagnosis not present

## 2021-02-13 NOTE — Progress Notes (Unsigned)
Discussed the results of the stress test with the patient.  We will cancel her future appointments, patient asymptomatic and doing well.  She will continue to follow-up with Dr. Hollace Kinnier.   Adrian Prows, MD, [facility] 02/13/2021, 5:11 PM Office: [phone] Pager: [phone]

## 2021-02-13 NOTE — Progress Notes (Signed)
Please cancel her future appointments. I have discussed the results with her. JG

## 2021-02-14 ENCOUNTER — Ambulatory Visit: Payer: Medicare Other | Admitting: Internal Medicine

## 2021-02-20 ENCOUNTER — Encounter: Payer: Self-pay | Admitting: Internal Medicine

## 2021-02-23 ENCOUNTER — Ambulatory Visit: Payer: Medicare Other | Admitting: Cardiology

## 2021-03-01 DIAGNOSIS — R0600 Dyspnea, unspecified: Secondary | ICD-10-CM | POA: Diagnosis not present

## 2021-03-01 NOTE — Progress Notes (Signed)
Nocturnal oximetry 02/28/2021: No significant hypoxemia noted. SPO2 less than 88% 1 minute and less than 89% 10 minutes. Patient may qualify for nocturnal oxygen supplementation per Medicare guidelines for group 2.

## 2021-03-06 DIAGNOSIS — F064 Anxiety disorder due to known physiological condition: Secondary | ICD-10-CM | POA: Diagnosis not present

## 2021-04-24 ENCOUNTER — Other Ambulatory Visit: Payer: Self-pay | Admitting: Cardiology

## 2021-04-24 DIAGNOSIS — I209 Angina pectoris, unspecified: Secondary | ICD-10-CM

## 2021-05-02 ENCOUNTER — Other Ambulatory Visit: Payer: Self-pay

## 2021-05-02 ENCOUNTER — Telehealth: Payer: Self-pay | Admitting: Podiatry

## 2021-05-02 ENCOUNTER — Ambulatory Visit (INDEPENDENT_AMBULATORY_CARE_PROVIDER_SITE_OTHER): Payer: Medicare Other

## 2021-05-02 ENCOUNTER — Ambulatory Visit (INDEPENDENT_AMBULATORY_CARE_PROVIDER_SITE_OTHER): Payer: Medicare Other | Admitting: Podiatry

## 2021-05-02 DIAGNOSIS — M79671 Pain in right foot: Secondary | ICD-10-CM

## 2021-05-02 DIAGNOSIS — S92352A Displaced fracture of fifth metatarsal bone, left foot, initial encounter for closed fracture: Secondary | ICD-10-CM

## 2021-05-02 DIAGNOSIS — M79672 Pain in left foot: Secondary | ICD-10-CM

## 2021-05-02 DIAGNOSIS — M84475A Pathological fracture, left foot, initial encounter for fracture: Secondary | ICD-10-CM

## 2021-05-02 DIAGNOSIS — I209 Angina pectoris, unspecified: Secondary | ICD-10-CM | POA: Diagnosis not present

## 2021-05-02 DIAGNOSIS — M21611 Bunion of right foot: Secondary | ICD-10-CM

## 2021-05-02 NOTE — Telephone Encounter (Signed)
Faith Edwards- if she is not able to wear the boot then we can try a surgical shoe. Please have her come by and get one. Thanks.

## 2021-05-02 NOTE — Telephone Encounter (Signed)
Patient called our office stating she was seen today in our office and was given a boot that she has fell at home in twice. She states that she has taken the boot off and will not put it back on. She wants to know if there is anything else she can put on her foot.

## 2021-05-02 NOTE — Patient Instructions (Signed)
Metatarsal Fracture A metatarsal fracture is a break in one of the five bones that connect the toes to the rest of the foot. This may also be called a forefoot fracture. A metatarsal fracture may be:  A crack in the surface of the bone (stress fracture). This often occurs in athletes.  A break all the way through the bone (complete fracture). The bone that connects to the little toe (fifth metatarsal) is most commonly fractured. Ballet dancers often fracture this bone. What are the causes? A metatarsal fracture may be caused by:  Sudden twisting of the foot.  Falling onto the foot.  Something heavy falling onto the foot.  Overuse or repetitive exercise. What increases the risk? This condition is more likely to develop in people who:  Play contact sports.  Do ballet.  Have a condition that causes the bones to become thin and brittle (osteoporosis).  Have a low calcium level. What are the signs or symptoms? Symptoms of this condition include:  Pain that gets worse when walking or standing.  Pain when pressing on the foot or moving the toes.  Swelling.  Bruising on the top or bottom of the foot. How is this diagnosed? This condition may be diagnosed based on:  Your symptoms.  Any recent foot injuries you have had.  A physical exam.  An X-ray of your foot. If you have a stress fracture, it may not show up on an X-ray, and you may need other imaging tests, such as: ? A bone scan. ? CT scan. ? MRI. How is this treated? Treatment depends on how severe your fracture is and how the pieces of the broken bone line up with each other (alignment). Treatment may involve:  Wearing a cast, splint, or supportive boot on your foot.  Using crutches, and not putting any weight on your foot.  Having surgery to align broken bones (open reduction and internal fixation, ORIF).  Physical therapy.  Follow-up visits and X-rays to make sure you are healing. Follow these instructions  at home: If you have a splint or a supportive boot:  Wear the splint or boot as told by your health care provider. Remove it only as told by your health care provider.  Loosen the splint or boot if your toes tingle, become numb, or turn cold and blue.  Keep the splint or boot clean.  If your splint or boot is not waterproof: ? Do not let it get wet. ? Cover it with a watertight covering when you take a bath or a shower. If you have a cast:  Do not stick anything inside the cast to scratch your skin. Doing that increases your risk for infection.  Check the skin around the cast every day. Tell your health care provider about any concerns.  You may put lotion on dry skin around the edges of the cast. Do not put lotion on the skin underneath the cast.  Keep the cast clean.  If the cast is not waterproof: ? Do not let it get wet. ? Cover it with a watertight covering when you take a bath or a shower. Activity  Do not use your affected leg to support your body weight until your health care provider says that you can. Use crutches as directed.  Ask your health care provider what activities are safe for you during recovery, and ask what activities you need to avoid.  Do physical therapy exercises as directed. Driving  Do not drive or use heavy machinery   while taking pain medicine.  Do not drive while wearing a cast, splint, or boot on a foot that you use for driving. Managing pain, stiffness, and swelling  If directed, put ice on painful areas: ? Put ice in a plastic bag. ? Place a towel between your skin and the bag.  If you have a removable splint or boot, remove it as told by your health care provider.  If you have a cast, place a towel between your cast and the bag. ? Leave the ice on for 20 minutes, 2-3 times a day.  Move your toes often to avoid stiffness and to lessen swelling.  Raise (elevate) your lower leg above the level of your heart while you are sitting or  lying down.   General instructions  Do not put pressure on any part of the cast or splint until it is fully hardened. This may take several hours.  Take over-the-counter and prescription medicines only as told by your health care provider.  Do not use any products that contain nicotine or tobacco, such as cigarettes and e-cigarettes. These can delay bone healing. If you need help quitting, ask your health care provider.  Do not take baths, swim, or use a hot tub until your health care provider approves. Ask your health care provider if you may take showers.  Keep all follow-up visits as told by your health care provider. This is important. Contact a health care provider if you have:  Pain that gets worse or does not get better with medicine.  A fever.  A bad smell coming from your cast or splint. Get help right away if you have:  Any of the following in your toes or your foot, even after loosening your splint (if applicable): ? Numbness. ? Tingling. ? Coldness. ? Blue skin.  Redness or swelling that gets worse.  Pain that suddenly becomes severe. Summary  A metatarsal fracture is a break in one of the five bones that connect the toes to the rest of the foot.  Treatment depends on how severe your fracture is and how the pieces of the broken bone line up with each other (alignment). This may include wearing a cast, splint, or supportive boot, or using crutches. Sometimes surgery is needed to align the bones.  Ice and elevate your foot to help lessen the pain and swelling.  Make sure you know what symptoms should cause you to get help right away. This information is not intended to replace advice given to you by your health care provider. Make sure you discuss any questions you have with your health care provider. Document Revised: 04/09/2019 Document Reviewed: 01/13/2018 Elsevier Patient Education  [place].

## 2021-05-03 ENCOUNTER — Other Ambulatory Visit: Payer: Self-pay | Admitting: Podiatry

## 2021-05-03 ENCOUNTER — Telehealth: Payer: Self-pay | Admitting: Podiatry

## 2021-05-03 DIAGNOSIS — M84475A Pathological fracture, left foot, initial encounter for fracture: Secondary | ICD-10-CM

## 2021-05-03 NOTE — Telephone Encounter (Signed)
Patient came into our office this morning to swap the boot out for a surgical shoe.

## 2021-05-03 NOTE — Telephone Encounter (Signed)
Pt left message @916am  stating she cannot use the boot she was given for her broken foot and was told to come in to pick up something else. She would like to come today. Her # is [phone].

## 2021-05-03 NOTE — Telephone Encounter (Signed)
Called patient and left a message if the boot was not helping or working then patient can stop by the office to get a surgical shoe.Faith Edwards

## 2021-05-05 NOTE — Progress Notes (Signed)
Subjective: 87 year old female presents the office today originally for concerns of a bunion on her right foot which is started become uncomfortable.  She does not want surgery.  However she states that 9 days ago since she made the appointment she did fall injuring her left foot.  She states that she was doing okay until she went to the grocery store and tried to walk on her foot more and the pain worsened as well as swelling.  She states that she was sitting her foot fell asleep and when she stood up her foot gave out and she fell.  She has not had any treatment so far.  No pain in the ankle. Denies any systemic complaints such as fevers, chills, nausea, vomiting. No acute changes since last appointment, and no other complaints at this time.   Objective: AAO x3, NAD DP/PT pulses palpable bilaterally, CRT less than 3 seconds LEFT: There is edema present left foot.  There is no pain on the tibia, fibula, proximal tib-fib.  No pain to the talus.  No pain with Achilles tendon.  Ankle ligaments appear intact.  Tenderness to palpation along the dorsal distal portion of the forefoot mostly localized to the fifth metatarsal.  No open lesions on the left side.   RIGHT: Moderate to severe bunion is present with mild erythema on the medial metatarsal head from irritation inside shoes but there is no skin breakdown, warmth.  No pain with calf compression, swelling, warmth, erythema  Assessment: Left fifth metatarsal displaced fracture; right foot bunion  Plan: -All treatment options discussed with the patient including all alternatives, risks, complications.   1.  Metatarsal fracture, left -X-rays obtained reviewed which did reveal displaced metatarsal fracture.  I discussed with her both conservative as well as surgical options.  Given her age we will try to treat this conservatively.  She was placed into a cam boot today.  Encouraged ice elevation as well as compression.  2. Bunion, right -X-rays obtained  reviewed.  Moderate severe bunion is present.  There is no evidence of acute fracture of the right foot. -Dispensed offloading pads to the bunion.  Discussed shoe modifications.  Return for left metatarsal fracture, x-ray.   Trula Slade DPM

## 2021-05-25 ENCOUNTER — Ambulatory Visit: Payer: Medicare Other | Admitting: Internal Medicine

## 2021-05-25 ENCOUNTER — Ambulatory Visit (INDEPENDENT_AMBULATORY_CARE_PROVIDER_SITE_OTHER): Payer: Medicare Other

## 2021-05-25 ENCOUNTER — Ambulatory Visit: Payer: Medicare Other | Admitting: Family

## 2021-05-25 ENCOUNTER — Other Ambulatory Visit: Payer: Self-pay

## 2021-05-25 DIAGNOSIS — M8589 Other specified disorders of bone density and structure, multiple sites: Secondary | ICD-10-CM | POA: Diagnosis not present

## 2021-05-25 MED ORDER — DENOSUMAB 60 MG/ML ~~LOC~~ SOSY
60.0000 mg | PREFILLED_SYRINGE | Freq: Once | SUBCUTANEOUS | Status: AC
  Administered 2021-05-25: 60 mg via SUBCUTANEOUS

## 2021-06-26 ENCOUNTER — Other Ambulatory Visit: Payer: Self-pay

## 2021-06-26 ENCOUNTER — Encounter (HOSPITAL_COMMUNITY): Payer: Self-pay | Admitting: Emergency Medicine

## 2021-06-26 ENCOUNTER — Ambulatory Visit (HOSPITAL_COMMUNITY)
Admission: EM | Admit: 2021-06-26 | Discharge: 2021-06-26 | Disposition: A | Discharge status: Home / Self Care | Payer: Medicare Other | Attending: Emergency Medicine | Admitting: Emergency Medicine

## 2021-06-26 DIAGNOSIS — W5503XA Scratched by cat, initial encounter: Secondary | ICD-10-CM

## 2021-06-26 DIAGNOSIS — R600 Localized edema: Secondary | ICD-10-CM | POA: Diagnosis not present

## 2021-06-26 MED ORDER — MUPIROCIN CALCIUM 2 % EX CREA
1.0000 | TOPICAL_CREAM | Freq: Two times a day (BID) | CUTANEOUS | 0 refills | Status: DC
Start: 2021-06-26 — End: 2021-11-27

## 2021-06-26 MED ORDER — AZITHROMYCIN 250 MG PO TABS
ORAL_TABLET | ORAL | 0 refills | Status: DC
Start: 2021-06-26 — End: 2021-07-04

## 2021-06-26 NOTE — ED Triage Notes (Signed)
Patient is not sure if injury is a cat bite or a scratch.  The cat is hers and vaccines are current per patient.  Open wound to inside of left lower leg.  .  Patient has foot swelling from an old injury, but lower leg swelling is new per patient.

## 2021-06-26 NOTE — Discharge Instructions (Signed)
Take 2 tablets of azithromycin the first day.  Take 1 tablet on days 2 through 5. Apply mupirocin once daily.

## 2021-06-26 NOTE — ED Provider Notes (Signed)
[place]  ____________________________________________  Time seen: Approximately 11:38 AM  I have reviewed the triage vital signs and the nursing notes.   HISTORY  Chief Complaint Animal Bite and cat scratch   Historian Patient    HPI Faith Edwards is a 87 y.o. female presents to the urgent care with concern for some pain along left lower leg surrounding a cat scratch wound.  Patient reports that she has some chronic left lower extremity edema from a prior left foot fracture 2 months ago.  She denies fever and chills.  Patient states that she has been able to ambulate easily.  Patient lives with her husband at home and they both live independently.  No chest pain, chest tightness or abdominal pain.  Past Medical History:  Diagnosis Date   Anxiety    Arthritis    Asthma    Bronchitis    hx of    Chronic headaches    [facility]   COVID-19    Cystitis    1954   Depression    Ear pain    bilat    Hiatal hernia    History of blood clots    Lungs, per [facility] new patient packet    History of blood transfusion    2010   Hx of blood clots 2007 & 2010   post TKR   Hyperthyroidism    Pelvic fracture (HCC)    hx of in three places secondary to fall    Pneumonia    hx of 1952   Pulmonary embolism ([place]) 1993   on HRT   Right wrist fracture    hx of 1995 has plate secondary to fall    Shaking    hands bilat   Shingles     per [facility] new patient packet    Shortness of breath dyspnea    Skin cancer    basal cell    Spinal stenosis    Trauma    left lower leg 10/22/2014    Urinary frequency    Urticaria      Immunizations up to date:  Yes.     Past Medical History:  Diagnosis Date   Anxiety    Arthritis    Asthma    Bronchitis    hx of    Chronic headaches    [facility]   COVID-19    Cystitis    1954   Depression    Ear pain    bilat    Hiatal hernia    History of blood clots    Lungs, per [place] new patient packet    History of  blood transfusion    2010   Hx of blood clots 2007 & 2010   post TKR   Hyperthyroidism    Pelvic fracture (HCC)    hx of in three places secondary to fall    Pneumonia    hx of 1952   Pulmonary embolism ([place]) 1993   on HRT   Right wrist fracture    hx of 1995 has plate secondary to fall    Shaking    hands bilat   Shingles     per [facility] new patient packet    Shortness of breath dyspnea    Skin cancer    basal cell    Spinal stenosis    Trauma    left lower leg 10/22/2014    Urinary frequency    Urticaria     Patient Active Problem List  Diagnosis Date Noted   Allergic rhinitis 10/13/2019   Multiple lung nodules on CT 07/29/2018   Other chest pain 07/28/2018   Prediabetes 05/09/2017   CKD (chronic kidney disease) stage 3, GFR 30-59 ml/min (HCC) 11/08/2016   Bursitis of right knee 08/07/2016   Herpes zoster 06/02/2016   Upper airway cough syndrome 06/01/2016   Hyperlipidemia 03/22/2016   Choking 03/15/2016   GERD (gastroesophageal reflux disease) 03/15/2016   Graves disease 01/04/2016   Osteopenia, severe 10/14/2015   Other constipation 09/01/2015   Incisional hernia 07/08/2015   Ventral hernia without obstruction or gangrene 05/12/2015   Lower back pain 05/12/2015   Spinal stenosis in cervical region 11/26/2013   Cough 03/07/2012   Depression 08/05/2008   Personal history of venous thrombosis and embolism 08/05/2008   Chronic rhinitis 12/29/2007   Asthma 12/29/2007    Past Surgical History:  Procedure Laterality Date   ABDOMINAL HYSTERECTOMY  1969    per [facility] new patient packet    [place]    per [place] new patient packet    COLONOSCOPY     diverticulosis   ESI   11/2013    C7-T1 ; Dr Nelva Bush   EYE SURGERY     bilat cataract surgery    INSERTION OF MESH N/A 07/08/2015   Procedure: INSERTION OF MESH;  Surgeon: Armandina Gemma, MD;  Location: [facility];  Service: General;  Laterality: N/A;    REPLACEMENT TOTAL KNEE BILATERAL  2007 & 2010   SP FACET INJECTION  01/19/14   RC2-3,3-4,C4-5   TONSILLECTOMY  1945    per [facility] new patient packet    VENTRAL HERNIA REPAIR N/A 07/08/2015   Procedure: VENTRAL ADULT REPAIR VENTRAL INCISIONAL HERNIA REPAIR;  Surgeon: Armandina Gemma, MD;  Location: [facility];  Service: General;  Laterality: N/A;   WRIST FRACTURE SURGERY Bilateral 10  years and 2 years/   WRIST SURGERY Left 06/2016   plate     Prior to Admission medications   Medication Sig Start Date End Date Taking? Authorizing Provider  azithromycin (ZITHROMAX Z-PAK) 250 MG tablet Take two tablets the first day. Take one tablet on days 2-5. 06/26/21  Yes Vallarie Mare M, PA-C  ascorbic acid (VITAMIN C) 500 MG tablet Take 500 mg by mouth daily.    [provider]  aspirin EC 81 MG tablet Take 81 mg by mouth daily.    [provider]  atorvastatin (LIPITOR) 20 MG tablet Take 1 tablet (20 mg total) by mouth daily. 01/05/21   Reed, Tiffany L, DO  cholecalciferol (VITAMIN D) 1000 UNITS tablet Take 1,000 Units by mouth daily.    [provider]  Cyanocobalamin (B-12) 3000 MCG SUBL Place 1 tablet under the tongue daily.    [provider]  denosumab (PROLIA) 60 MG/ML SOSY injection Inject 60 mg into the skin every 6 (six) months.    [provider]  DULoxetine (CYMBALTA) 30 MG capsule Take 30 mg by mouth daily.  03/16/18   [provider]  famotidine (PEPCID) 40 MG tablet TAKE 1 TABLET(40 MG) BY MOUTH AT BEDTIME 12/02/20   Ladene Artist, MD  metoprolol succinate (TOPROL-XL) 25 MG 24 hr tablet TAKE 1 TABLET(25 MG) BY MOUTH DAILY WITH OR IMMEDIATELY FOLLOWING A MEAL 04/24/21   Adrian Prows, MD  mupirocin cream (BACTROBAN) 2 % Apply 1 application topically 2 (two) times daily. 06/26/21  Yes Lannie Fields, PA-C  Fluticasone-Salmeterol (  ADVAIR) 100-50 MCG/DOSE AEPB Inhale 1 puff into the lungs 2 (two) times daily.  03/07/12  [provider]     Allergies Penicillins and Sulfa antibiotics  Family History  Problem Relation Age of Onset   Allergies Brother    Asthma Brother    Clotting disorder Father        PTE X 2   Skin cancer Father    Heart failure Father        Per [place] New Patient Packet    Skin cancer Mother    Pneumonia Mother        1/2 lung loss per [place] New Patient Packet    Sudden death Other        2 M aunts & 2 M uncles in 9s   Heart attack Brother        > 81   Bladder Cancer Brother    Schizophrenia Son        Per [facility] New Patient Packet    Lung disease Son        Per [place] New Patient Packet    Mental illness Son        suicide by hanging   Diabetes Son    Hepatitis C Son    Diabetes Daughter    Migraines Daughter    Heart attack Daughter    Breast cancer Daughter    Stroke Neg Hx     Social History Social History   Tobacco Use   Smoking status: Never   Smokeless tobacco: Never  Vaping Use   Vaping Use: Never used  Substance Use Topics   Alcohol use: Yes    Alcohol/week: 4.0 standard drinks    Types: 4 Glasses of wine per week    Comment: minimal   Drug use: No     Review of Systems  Constitutional: No fever/chills Eyes:  No discharge ENT: No upper respiratory complaints. Respiratory: no cough. No SOB/ use of accessory muscles to breath Gastrointestinal:   No nausea, no vomiting.  No diarrhea.  No constipation. Musculoskeletal: Negative for musculoskeletal pain. Skin: Patient has cat scratch of left leg.     ____________________________________________   PHYSICAL EXAM:  VITAL SIGNS: ED Triage Vitals  Enc Vitals Group     BP 06/26/21 1046 (!) 130/58     Pulse Rate 06/26/21 1046 78     Resp 06/26/21 1046 (!) 22     Temp 06/26/21 1046 98.7 F (37.1 C)     Temp Source 06/26/21 1046 Oral     SpO2 06/26/21 1046 98 %     Weight --      Height --      Head Circumference --      Peak Flow --      Pain Score 06/26/21 1042 7     Pain Loc --      Pain Edu? --       Excl. in [place]? --      Constitutional: Alert and oriented. Well appearing and in no acute distress. Eyes: Conjunctivae are normal. PERRL. EOMI. Head: Atraumatic. ENT:  Cardiovascular: Normal rate, regular rhythm. Normal S1 and S2.  Good peripheral circulation. Respiratory: Normal respiratory effort without tachypnea or retractions. Lungs CTAB. Good air entry to the bases with no decreased or absent breath sounds Gastrointestinal: Bowel sounds x 4 quadrants. Soft and nontender to palpation. No guarding or rigidity. No distention. Musculoskeletal: Full range of motion to all extremities. No obvious deformities noted Neurologic:  Normal for  age. No gross focal neurologic deficits are appreciated.  Skin: Patient has cat scratch of left leg with no surrounding cellulitis.  There is expression of serosanguineous exudate.  Patient has 1+ pitting edema of the left ankle where cat scratch wound is located. Psychiatric: Mood and affect are normal for age. Speech and behavior are normal.   ____________________________________________   LABS (all labs ordered are listed, but only abnormal results are displayed)  Labs Reviewed - No data to display ____________________________________________  EKG   ____________________________________________  RADIOLOGY  No results found.  ____________________________________________    PROCEDURES  Procedure(s) performed:     Procedures     Medications - No data to display   ____________________________________________   INITIAL IMPRESSION / ASSESSMENT AND PLAN / ED COURSE  Pertinent labs & imaging results that were available during my care of the patient were reviewed by me and considered in my medical decision making (see chart for details).      Assessment and plan Cat scratch 87 year old female presents to the urgent care with concern for cat scratch wound along the left lower extremity.  Patient was tachypneic at triage but vital  signs were otherwise reassuring.  On exam, patient had no surrounding cellulitis but did have expression of serosanguineous exudate.  Recommended azithromycin for cat scratch wound given penicillin allergy.  Also treated patient with topical mupirocin.  Patient was given strict instructions to seek care local emergency department in 2 days if symptoms do not seem to be improving or worsening.  She voiced understanding has easy access to the emergency department should symptoms change.     ____________________________________________  FINAL CLINICAL IMPRESSION(S) / ED DIAGNOSES  Final diagnoses:  Cat scratch      NEW MEDICATIONS STARTED DURING THIS VISIT:  ED Discharge Orders          Ordered    azithromycin (ZITHROMAX Z-PAK) 250 MG tablet        06/26/21 1129    mupirocin cream (BACTROBAN) 2 %  2 times daily        06/26/21 1130                This chart was dictated using voice recognition software/Dragon. Despite best efforts to proofread, errors can occur which can change the meaning. Any change was purely unintentional.     Lannie Fields, PA-C 06/26/21 1141

## 2021-06-30 ENCOUNTER — Encounter (HOSPITAL_COMMUNITY): Payer: Self-pay | Admitting: *Deleted

## 2021-06-30 ENCOUNTER — Other Ambulatory Visit: Payer: Self-pay

## 2021-06-30 ENCOUNTER — Emergency Department (HOSPITAL_COMMUNITY)
Admission: EM | Admit: 2021-06-30 | Discharge: 2021-06-30 | Discharge status: Against Medical Advice | Payer: Medicare Other | Attending: Emergency Medicine | Admitting: Emergency Medicine

## 2021-06-30 DIAGNOSIS — Z5321 Procedure and treatment not carried out due to patient leaving prior to being seen by health care provider: Secondary | ICD-10-CM | POA: Insufficient documentation

## 2021-06-30 DIAGNOSIS — S8992XA Unspecified injury of left lower leg, initial encounter: Secondary | ICD-10-CM | POA: Diagnosis present

## 2021-06-30 DIAGNOSIS — S80812A Abrasion, left lower leg, initial encounter: Secondary | ICD-10-CM | POA: Diagnosis not present

## 2021-06-30 DIAGNOSIS — W5503XA Scratched by cat, initial encounter: Secondary | ICD-10-CM | POA: Diagnosis not present

## 2021-06-30 NOTE — ED Provider Notes (Signed)
Emergency Medicine Provider Triage Evaluation Note  DONNIKA KUCHER , a 87 y.o. female  was evaluated in triage.  Pt complains of a cat scratch to the left calf.  States it was her cat who is U2D on its vaccinations. She went to UC about 5 days ago and completed a course of azithromycin and but her sx continued to worsen so she came here today. Denies any fevers, chills, n/v. She is allergic to penicillin.  Physical Exam  BP (!) 166/71   Pulse 95   Temp 98.5 F (36.9 C) (Oral)   Resp 18   SpO2 100%  Gen:   Awake, no distress   Resp:  Normal effort  MSK:   Moves extremities without difficulty  Other:    Medical Decision Making  Medically screening exam initiated at 5:24 PM.  Appropriate orders placed.  Millissa Deese Vandenheuvel was informed that the remainder of the evaluation will be completed by another provider, this initial triage assessment does not replace that evaluation, and the importance of remaining in the ED until their evaluation is complete.   Rayna Sexton, PA-C 06/30/21 1725    Drenda Freeze, MD 06/30/21 2133

## 2021-06-30 NOTE — ED Notes (Signed)
The patient decided to leave without being seen by a provider. The patient was informed to come back to be seen if her condition worsened.

## 2021-06-30 NOTE — ED Triage Notes (Signed)
The pt is c/o lt lower leg  her own cat scratched  her 10 days ago ahw has finished a zithromax r,  and she feels like the pain and swelling is getting worse  claw  marks to the lt lower leg  her cat has had its shots

## 2021-07-01 DIAGNOSIS — W5503XA Scratched by cat, initial encounter: Secondary | ICD-10-CM | POA: Diagnosis not present

## 2021-07-01 DIAGNOSIS — Z23 Encounter for immunization: Secondary | ICD-10-CM | POA: Diagnosis not present

## 2021-07-01 DIAGNOSIS — S80812A Abrasion, left lower leg, initial encounter: Secondary | ICD-10-CM | POA: Diagnosis not present

## 2021-07-04 ENCOUNTER — Ambulatory Visit (INDEPENDENT_AMBULATORY_CARE_PROVIDER_SITE_OTHER): Payer: Medicare Other

## 2021-07-04 ENCOUNTER — Ambulatory Visit (INDEPENDENT_AMBULATORY_CARE_PROVIDER_SITE_OTHER): Payer: Medicare Other | Admitting: Internal Medicine

## 2021-07-04 ENCOUNTER — Other Ambulatory Visit: Payer: Self-pay

## 2021-07-04 ENCOUNTER — Encounter: Payer: Self-pay | Admitting: Internal Medicine

## 2021-07-04 DIAGNOSIS — R058 Other specified cough: Secondary | ICD-10-CM | POA: Diagnosis not present

## 2021-07-04 DIAGNOSIS — I209 Angina pectoris, unspecified: Secondary | ICD-10-CM | POA: Diagnosis not present

## 2021-07-04 DIAGNOSIS — R911 Solitary pulmonary nodule: Secondary | ICD-10-CM | POA: Diagnosis not present

## 2021-07-04 DIAGNOSIS — R059 Cough, unspecified: Secondary | ICD-10-CM | POA: Diagnosis not present

## 2021-07-04 NOTE — Patient Instructions (Signed)
Try prilosec  20mg  x 2   Take 30-60 min before first meal of the day and Pepcid ac (famotidine) 20 mg one after supper until cough is completely gone for at least a week without the need for cough suppression  For cough > delsym 2 tsp every 12 hours as needed   For drainage / throat tickle try take CHLORPHENIRAMINE  4 mg  (Chlortab 4mg   at [employer] should be easiest to find in the green box)  take one every 4 hours as needed - available over the counter- may cause drowsiness so start with just a dose or two an hour before bedtime and see how you tolerate it before trying in daytime    GERD (REFLUX)  is an extremely common cause of respiratory symptoms just like yours , many times with no obvious heartburn at all.    It can be treated with medication, but also with lifestyle changes including elevation of the head of your bed (ideally with 6 -8inch blocks under the headboard of your bed),  Smoking cessation, avoidance of late meals, excessive alcohol, and avoid fatty foods, chocolate, peppermint, colas, red wine, and acidic juices such as orange juice.  NO MINT OR MENTHOL PRODUCTS SO NO COUGH DROPS  USE SUGARLESS CANDY INSTEAD (Jolley ranchers or Stover's or Life Savers) or even ice chips will also do - the key is to swallow to prevent all throat clearing. NO OIL BASED VITAMINS - use powdered substitutes.  Avoid fish oil when coughing.    Please remember to go to the  x-ray department  for your tests - we will call you with the results when they are available     If not better to your satisfaction after one month >  return with all meds in hand using a trust but verify approach to confirm accurate Medication  Reconciliation The principal here is that until we are certain that the  patients are doing what we've asked, it makes no sense to ask them to do more.

## 2021-07-04 NOTE — Progress Notes (Addendum)
Subjective:    Patient ID: Faith Edwards, female    DOB: March 03, 1932 .   MRN: [id number]  Brief patient profile:  47 yowf never smoker  eval 2013 for cough variant asthma better on qvar/gerd rx but did not stay on any maint rx just occasional saba with specific exposures maybe twice a month and did fine off gerd rx  until Mid may 2017 acute onset uri/sinus infection/ cough / r lat pleuritic cp   > Faith Edwards eval rx shot/prednisone/zpak> not better so referred to pulmonary clinic 05/31/2016 by Faith Edwards.   History of Present Illness  05/31/2016  Pulmonary consultation/ office visit/ Faith Edwards  Only rx = saba prn but hfa poor  Chief Complaint  Patient presents with   Pulmonary Consult    Referred by Faith Edwards; chest congestion, coughing up cloudy mucus.  chest tightness.  discuss air travel, precautions for blood clots; ribs are sore from coughing  hx as above, more coughing issues than sob/  worse cp with coughing fits - the pain has improved despite the coughing continuing, never started back on gerd rx as prev rec Hx indicates c/o choking and dysphagia for months prior to onset of cough  - saw GI PA 03/29/16 with DgEs pos only for [facility] and was instructed to stop H1 at this point. rec  Only use your albuterol (ventolin) as a rescue medication  Try prilosec otc 20mg   Take 30-60 min before first meal of the day and Pepcid ac (famotidine) 20 mg one @  bedtime until cough is completely gone for at least a week without the need for cough suppression GERD .diet  If not all better in 2 weeks return to clinic  > did not return    Allergy eval Faith Edwards 08/07/18  Spirometry: FEV1: 1.52L 80%, FVC: 1.87L 73%, ratio consistent with nonobstructive pattern   Allergy testing: Environmental allergy skin prick testing is positive to [place], sheep Sorrell, Phoma betae and cockroach.   allergy testing results were read and interpreted by provider, documented by clinical staff. Positive to grass pollen, weed pollen,  mold, and cockroach.   Imp/Rec:  Chronic Cough due to allergic rhinitis   - multifactorial likely due to post-nasal drainage and reflux   - environmental allergy skin testing today is positive grass pollen, weed pollen, mold and cockroach.  Allergen avoidance measures discussed and handouts provided   - for post-nasal drainage recommend use of nasal antihistamine, Astelin 1-2 sprays twice a day   - continue your Allegra daily   - take your ranitidine 150mg  twice a day to control reflux (you could also have silent reflux which may not present with heartburn symptoms)     - have access to albuterol inhaler 2 puffs every 4-6 hours as needed for cough/wheeze/shortness of breath/chest tightness.  May use 15-20 minutes prior to activity.   Monitor frequency of use.           08/08/2018  Acute extended ov/Faith Edwards re: re-establish re cough/sob recurrent/ abn ct chest  Chief Complaint  Patient presents with   Acute Visit    Pt c/o increased SOB, cough and chest discomfort since Jan 2019. She had CTA on 07/29/18- no PE but nodules present.    Dyspnea:  Ok unless coughing but fatigued all the time  Cough: dry day > noct, non-productive, indolent onset Jan 2019 persistent daily  Developed off gerd rx  Sleeping: disturbed by coughing  SABA use: not using / no better on advair or  saba  02: none   gen anterior chest pain with coughing only   Rec Try prilosec otc 20mg   Take 30-60 min before first meal of the day and Ranitidine to where you take it after supper and at bedtime  until cough is completely gone for at least a week without the need for cough suppression GERD For drainage / throat tickle try take CHLORPHENIRAMINE  4 mg - For cough > delsym 2 tsp every 12 hours as needed     07/04/2021  f/u ov/Faith Edwards re:  UACS worse since x sev weeks not on any gerd rx or h1  Chief Complaint  Patient presents with   Follow-up    Follow up , Upper airway syndrome. Patient reports she currently has cat scratch  fever. She just wants a check up to make sure her lungs are doing well/   Dyspnea:  Not limited by breathing from desired activities   Cough: assoc with sensation of pnds day and night, no longer on gerd rx or 1st gen H1 blockers per guidelines   Sleeping: bed is flat , sleep fine  " a couple of inhalers " (not sure which ones) SABA use: none 02: none  Covid status:   had covid delta plus 2 vax   No obvious day to day or daytime variability or assoc excess/ purulent sputum or mucus plugs or hemoptysis or cp or chest tightness, subjective wheeze or overt sinus or hb symptoms.     Also denies any obvious fluctuation of symptoms with weather or environmental changes or other aggravating or alleviating factors except as outlined above   No unusual exposure hx or h/o childhood pna/ asthma or knowledge of premature birth.  Current Allergies, Complete Past Medical History, Past Surgical History, Family History, and Social History were reviewed in [employer] record.  ROS  The following are not active complaints unless bolded Hoarseness, sore throat, dysphagia, dental problems, itching, sneezing,  nasal congestion or discharge of excess mucus or purulent secretions, ear ache,   fever, chills, sweats, unintended wt loss or wt gain, classically pleuritic or exertional cp,  orthopnea pnd or arm/hand swelling  or leg swelling, presyncope, palpitations, abdominal pain, anorexia, nausea, vomiting, diarrhea  or change in bowel habits or change in bladder habits, change in stools or change in urine, dysuria, hematuria,  rash, arthralgias, visual complaints, headache, numbness, weakness or ataxia or problems with walking or coordination,  change in mood or  memory.        Current Meds - - NOTE:   Unable to verify as accurately reflecting what pt takes    Medication Sig   ascorbic acid (VITAMIN C) 500 MG tablet Take 500 mg by mouth daily.   aspirin EC 81 MG tablet Take 81 mg by mouth  daily.   atorvastatin (LIPITOR) 20 MG tablet Take 1 tablet (20 mg total) by mouth daily.   cholecalciferol (VITAMIN D) 1000 UNITS tablet Take 1,000 Units by mouth daily.   Cyanocobalamin (B-12) 3000 MCG SUBL Place 1 tablet under the tongue daily.   denosumab (PROLIA) 60 MG/ML SOSY injection Inject 60 mg into the skin every 6 (six) months.   doxycycline (VIBRAMYCIN) 100 MG capsule Take 100 mg by mouth 2 (two) times daily.   DULoxetine (CYMBALTA) 30 MG capsule Take 30 mg by mouth daily.    famotidine (PEPCID) 40 MG tablet TAKE 1 TABLET(40 MG) BY MOUTH AT BEDTIME   metoprolol succinate (TOPROL-XL) 25 MG 24 hr tablet TAKE  1 TABLET(25 MG) BY MOUTH DAILY WITH OR IMMEDIATELY FOLLOWING A MEAL   mupirocin cream (BACTROBAN) 2 % Apply 1 application topically 2 (two) times daily.   mupirocin ointment (BACTROBAN) 2 % Apply topically 2 (two) times daily.               .       Objective:   Physical Exam     07/04/2021         161  08/08/2018         166    05/31/16 172 lb 6.4 oz (78.2 kg)  05/30/16 173 lb (78.472 kg)  05/23/16 173 lb (78.472 kg)     Vital signs reviewed  07/04/2021  - Note at rest 02 sats  96% on RA   General appearance:    elderly amb wf freq throat clearing       HEENT : pt wearing mask not removed for exam due to covid -19 concerns.    NECK :  without JVD/Nodes/TM/ nl carotid upstrokes bilaterally   LUNGS: no acc muscle use,  Nl contour chest which is clear to A and P bilaterally without cough on insp or exp maneuvers   CV:  RRR  no s3 or murmur or increase in P2, and no edema   ABD:  soft and nontender with nl inspiratory excursion in the supine position. No bruits or organomegaly appreciated, bowel sounds nl  MS:  Nl gait/ ext warm without deformities, calf tenderness, cyanosis or clubbing No obvious joint restrictions   SKIN: warm and dry without lesions    NEURO:  alert, approp, nl sensorium with  no motor or cerebellar deficits apparent.    CXR PA and  Lateral:   07/04/2021 :    I personally reviewed images and agree with radiology impression as follows:    No acute cardiopulmonary disease.        Assessment & Plan:           .

## 2021-07-05 ENCOUNTER — Encounter: Payer: Self-pay | Admitting: Internal Medicine

## 2021-07-05 NOTE — Assessment & Plan Note (Signed)
Recurrent mid may 2017 off all gerd rx and again Jan 2019 off gerd maint rx > resumed 08/08/2018  -  07/07/19 severe gerd to point of gag/vomit >  Ct abd neg x small HH - 11/19/2019 televist > restart  1st gen H1 blockers per guidelines  / max gerd rx/ ov in 2 weeks with all meds> did not do  - recurrent flare, seen 07/04/2021 rec gerd rx and 1st gen H1 blockers per guidelines  And if not better return with all meds in hand using a trust but verify approach to confirm accurate Medication  Reconciliation The principal here is that until we are certain that the  patients are doing what we've asked, it makes no sense to ask them to do more.    Of the three most common causes of  Sub-acute / recurrent or chronic cough, only one (GERD)  can actually contribute to/ trigger  the other two (asthma and post nasal drip syndrome)  and perpetuate the cylce of cough.  While not intuitively obvious, many patients with chronic low grade reflux do not cough until there is a primary insult that disturbs the protective epithelial barrier and exposes sensitive nerve endings.   This is typically viral but can due to PNDS and  either may apply here.   The point is that once this occurs, it is difficult to eliminate the cycle  using anything but a maximally effective acid suppression regimen at least in the short run, accompanied by an appropriate diet to address non acid GERD and control / eliminate the pnds with 1st gen H1 blockers per guidelines  And then return here to regrup if not satisfied.          Each maintenance medication was reviewed in detail including emphasizing most importantly the difference between maintenance and prns and under what circumstances the prns are to be triggered using an action plan format where appropriate.  Total time for H and P, chart review, counseling  and generating customized AVS unique to this office visit / same day charting for pt not seen in almost 3 years who is actively seeing  multiple other providers with incomplete med reconciliation = 30 min

## 2021-07-06 ENCOUNTER — Encounter: Payer: Self-pay | Admitting: *Deleted

## 2021-07-11 DIAGNOSIS — F064 Anxiety disorder due to known physiological condition: Secondary | ICD-10-CM | POA: Diagnosis not present

## 2021-07-14 ENCOUNTER — Ambulatory Visit (INDEPENDENT_AMBULATORY_CARE_PROVIDER_SITE_OTHER): Payer: Medicare Other | Admitting: Family

## 2021-07-14 ENCOUNTER — Other Ambulatory Visit: Payer: Self-pay

## 2021-07-14 ENCOUNTER — Encounter: Payer: Self-pay | Admitting: Family

## 2021-07-14 VITALS — BP 130/68 | HR 76 | Temp 97.1°F | Resp 16 | Ht 63.0 in | Wt 166.0 lb

## 2021-07-14 DIAGNOSIS — I209 Angina pectoris, unspecified: Secondary | ICD-10-CM

## 2021-07-14 DIAGNOSIS — S80812D Abrasion, left lower leg, subsequent encounter: Secondary | ICD-10-CM | POA: Diagnosis not present

## 2021-07-14 DIAGNOSIS — M79662 Pain in left lower leg: Secondary | ICD-10-CM | POA: Diagnosis not present

## 2021-07-14 DIAGNOSIS — M7989 Other specified soft tissue disorders: Secondary | ICD-10-CM | POA: Diagnosis not present

## 2021-07-14 DIAGNOSIS — W5503XD Scratched by cat, subsequent encounter: Secondary | ICD-10-CM | POA: Diagnosis not present

## 2021-07-14 NOTE — Patient Instructions (Signed)
Please go to ED for evaluation of left leg pain and swelling

## 2021-07-14 NOTE — Progress Notes (Signed)
Provider: Marlowe Sax FNP-C  Jude Linck, Nelda Bucks, NP  Patient Care Team: Halea Lieb, Nelda Bucks, NP as PCP - General (Family Medicine) Tanda Rockers, MD as Consulting Physician (Pulmonary Disease) Marica Otter, [place] (Optometry) Norma Fredrickson, MD as Consulting Physician (Psychiatry) Philemon Kingdom, MD as Consulting Physician (Internal Medicine) Leatha Gilding, MD as Referring Physician (Orthopedic Surgery) Harold Hedge, Darrick Grinder, MD as Consulting Physician (Allergy and Immunology) Ladene Artist, MD as Consulting Physician (Gastroenterology) Pedro Earls, MD as Attending Physician (Family Medicine) Melida Quitter, MD as Consulting Physician (Otolaryngology)  Extended Emergency Contact Information Primary Emergency Contact: Mcwherter,Carl Address: [address]          [place], [place] [address] [name] of [place] Phone: [phone] Relation: Spouse Secondary Emergency Contact: Driscilla Grammes, [place] [address] [name] of [place] Phone: [phone] Relation: Daughter  Code Status:  Full Code  Goals of care: Advanced Directive information Advanced Directives 07/14/2021  Does Patient Have a Medical Advance Directive? No  Does patient want to make changes to medical advance directive? -  Would patient like information on creating a medical advance directive? No - Patient declined     Chief Complaint  Patient presents with   Acute Visit    Complains of cat scratch from 1 month ago.     HPI:  Pt is a 87 y.o. female seen today for an acute visit for evaluation of left leg pain and swelling.states completed course of doxycycline two days ago for left leg catch scratch since 06/26/2021.She was seen in urgent care was prescribed Azithromycin.states redness resolved but pain has worsen.pain on left calf muscle.she denies any fever,chills or drainage.    Past Medical History:  Diagnosis Date   Anxiety    Arthritis    Asthma    Bronchitis    hx of     Chronic headaches    [facility]   COVID-19    Cystitis    1954   Depression    Ear pain    bilat    Hiatal hernia    History of blood clots    Lungs, per [facility] new patient packet    History of blood transfusion    2010   Hx of blood clots 2007 & 2010   post TKR   Hyperthyroidism    Pelvic fracture (HCC)    hx of in three places secondary to fall    Pneumonia    hx of 1952   Pulmonary embolism ([place]) 1993   on HRT   Right wrist fracture    hx of 1995 has plate secondary to fall    Shaking    hands bilat   Shingles     per [facility] new patient packet    Shortness of breath dyspnea    Skin cancer    basal cell    Spinal stenosis    Trauma    left lower leg 10/22/2014    Urinary frequency    Urticaria    Past Surgical History:  Procedure Laterality Date   ABDOMINAL HYSTERECTOMY  1969    per [facility] new patient packet    [place]    per [facility] new patient packet    COLONOSCOPY     diverticulosis   ESI   11/2013    C7-T1 ; Dr Nelva Bush   EYE SURGERY  bilat cataract surgery    INSERTION OF MESH N/A 07/08/2015   Procedure: INSERTION OF MESH;  Surgeon: Armandina Gemma, MD;  Location: [facility];  Service: General;  Laterality: N/A;   REPLACEMENT TOTAL KNEE BILATERAL  2007 & 2010   SP FACET INJECTION  01/19/14   RC2-3,3-4,C4-5   TONSILLECTOMY  1945    per [facility] new patient packet    VENTRAL HERNIA REPAIR N/A 07/08/2015   Procedure: VENTRAL ADULT REPAIR VENTRAL INCISIONAL HERNIA REPAIR;  Surgeon: Armandina Gemma, MD;  Location: [facility];  Service: General;  Laterality: N/A;   WRIST FRACTURE SURGERY Bilateral 10  years and 2 years/   WRIST SURGERY Left 06/2016   plate     Allergies  Allergen Reactions   Penicillins Hives   Sulfa Antibiotics Hives and Nausea And Vomiting    7-15    Outpatient Encounter Medications as of 07/14/2021  Medication Sig   ascorbic acid (VITAMIN C) 500 MG tablet Take 500 mg by mouth daily.    aspirin EC 81 MG tablet Take 81 mg by mouth daily.   atorvastatin (LIPITOR) 20 MG tablet Take 1 tablet (20 mg total) by mouth daily.   cholecalciferol (VITAMIN D) 1000 UNITS tablet Take 1,000 Units by mouth daily.   Cyanocobalamin (B-12) 3000 MCG SUBL Place 1 tablet under the tongue daily.   denosumab (PROLIA) 60 MG/ML SOSY injection Inject 60 mg into the skin every 6 (six) months.   doxycycline (VIBRAMYCIN) 100 MG capsule Take 100 mg by mouth 2 (two) times daily.   DULoxetine (CYMBALTA) 30 MG capsule Take 30 mg by mouth daily.    famotidine (PEPCID) 40 MG tablet TAKE 1 TABLET(40 MG) BY MOUTH AT BEDTIME   metoprolol succinate (TOPROL-XL) 25 MG 24 hr tablet TAKE 1 TABLET(25 MG) BY MOUTH DAILY WITH OR IMMEDIATELY FOLLOWING A MEAL   mupirocin cream (BACTROBAN) 2 % Apply 1 application topically 2 (two) times daily.   mupirocin ointment (BACTROBAN) 2 % Apply topically 2 (two) times daily.   [DISCONTINUED] Fluticasone-Salmeterol (ADVAIR) 100-50 MCG/DOSE AEPB Inhale 1 puff into the lungs 2 (two) times daily.   No facility-administered encounter medications on file as of 07/14/2021.    Review of Systems  Constitutional:  Negative for appetite change, chills, fatigue, fever and unexpected weight change.  Eyes:  Negative for pain, discharge, redness, itching and visual disturbance.  Respiratory:  Negative for cough, chest tightness, shortness of breath and wheezing.   Cardiovascular:  Negative for chest pain, palpitations and leg swelling.  Gastrointestinal:  Negative for abdominal distention, abdominal pain, blood in stool, constipation, diarrhea, nausea and vomiting.  Genitourinary:  Negative for difficulty urinating, dysuria, flank pain, frequency and urgency.  Musculoskeletal:  Negative for arthralgias, back pain, gait problem, joint swelling, myalgias, neck pain and neck stiffness.  Skin:  Negative for color change, pallor and rash.       Previous left leg cat scratch   Neurological:  Negative  for dizziness, syncope, speech difficulty, weakness, light-headedness, numbness and headaches.  Psychiatric/Behavioral:  Negative for agitation, behavioral problems, confusion, hallucinations and sleep disturbance. The patient is not nervous/anxious.    Immunization History  Administered Date(s) Administered   Fluad Quad(high Dose 65+) 09/24/2019, 10/24/2020   Influenza Whole 10/28/2008, 09/28/2009, 10/08/2011   Influenza, Seasonal, Injecte, Preservative Fre 11/03/2013   Influenza,inj,Quad PF,6+ Mos 10/05/2014, 09/30/2015   Influenza-Unspecified 09/25/2016, 09/09/2017, 07/31/2018   PFIZER(Purple Top)SARS-COV-2 Vaccination 01/01/2020, 01/30/2020, 12/11/2020   Pneumococcal Conjugate-13 09/30/2015   Pneumococcal Polysaccharide-23 01/07/2014   Tdap 10/10/2014  Zoster Recombinat (Shingrix) 07/05/2018, 09/09/2018   Pertinent  Health Maintenance Due  Topic Date Due   INFLUENZA VACCINE  07/31/2021   DEXA SCAN  10/13/2021   PNA vac Low Risk Adult  Completed   Fall Risk  07/14/2021 01/05/2021 10/24/2020 08/05/2020 06/20/2020  Falls in the past year? 0 0 0 0 1  Number falls in past yr: 0 0 0 0 0  Injury with Fall? 0 0 0 0 1  Comment - - - - Broke her foot  Risk Factor Category  - - - - -  Risk for fall due to : No Fall Risks - - - -  Risk for fall due to: Comment - - - - -  Follow up Falls evaluation completed - - - -   Functional Status Survey:    Vitals:   07/14/21 1518  BP: 130/68  Pulse: 76  Resp: 16  Temp: (!) 97.1 F (36.2 C)  SpO2: 96%  Weight: 166 lb (75.3 kg)  Height: 5\' 3"  (1.6 m)   Body mass index is 29.41 kg/m. Physical Exam Vitals reviewed.  Constitutional:      General: She is not in acute distress.    Appearance: Normal appearance. She is overweight. She is not ill-appearing or diaphoretic.  HENT:     Head: Normocephalic.     Mouth/Throat:     Mouth: Mucous membranes are moist.     Pharynx: Oropharynx is clear. No oropharyngeal exudate or posterior  oropharyngeal erythema.  Eyes:     General: No scleral icterus.       Right eye: No discharge.        Left eye: No discharge.     Extraocular Movements: Extraocular movements intact.     Conjunctiva/sclera: Conjunctivae normal.     Pupils: Pupils are equal, round, and reactive to light.  Cardiovascular:     Rate and Rhythm: Normal rate and regular rhythm.     Pulses: Normal pulses.     Heart sounds: Normal heart sounds. No murmur heard.   No friction rub. No gallop.  Pulmonary:     Effort: Pulmonary effort is normal. No respiratory distress.     Breath sounds: Normal breath sounds. No wheezing, rhonchi or rales.  Chest:     Chest wall: No tenderness.  Abdominal:     General: Bowel sounds are normal. There is no distension.     Palpations: Abdomen is soft. There is no mass.     Tenderness: There is no abdominal tenderness. There is no right CVA tenderness, left CVA tenderness, guarding or rebound.  Musculoskeletal:        General: No swelling. Normal range of motion.     Cervical back: Normal range of motion. No rigidity or tenderness.     Right lower leg: Normal.     Left lower leg: Swelling and tenderness present.     Comments: Left calf muscle tenderness   Lymphadenopathy:     Cervical: No cervical adenopathy.  Skin:    General: Skin is warm and dry.     Coloration: Skin is not pale.     Findings: No bruising, erythema, lesion or rash.  Neurological:     Mental Status: She is alert and oriented to person, place, and time.     Cranial Nerves: No cranial nerve deficit.     Sensory: No sensory deficit.     Motor: No weakness.     Coordination: Coordination normal.     Gait: Gait normal.  Psychiatric:        Mood and Affect: Mood normal.        Speech: Speech normal.        Behavior: Behavior normal.        Thought Content: Thought content normal.        Judgment: Judgment normal.    Labs reviewed: Recent Labs    10/20/20 0816  NA 140  K 5.1  CL 105  CO2 26   GLUCOSE 107*  BUN 25  CREATININE 1.02*  CALCIUM 9.7   Recent Labs    10/20/20 0816  AST 18  ALT 14  BILITOT 1.1  PROT 6.7   Recent Labs    10/20/20 0816  WBC 6.4  NEUTROABS 3,194  HGB 13.4  HCT 40.6  MCV 96.0  PLT 267   Lab Results  Component Value Date   TSH 5.86 (H) 01/30/2021   Lab Results  Component Value Date   HGBA1C 5.6 01/05/2021   Lab Results  Component Value Date   CHOL 177 10/20/2020   HDL 76 10/20/2020   LDLCALC 85 10/20/2020   TRIG 70 10/20/2020   CHOLHDL 2.3 10/20/2020    Significant Diagnostic Results in last 30 days:  DG Chest 2 View  Result Date: 07/05/2021 CLINICAL DATA:  Cough. EXAM: CHEST - 2 VIEW COMPARISON:  CT 07/29/2018.  Chest x-ray 07/28/2018. FINDINGS: Mediastinum and hilar structures normal. Heart size normal. No focal infiltrate. Previously noted tiny pulmonary nodules on CT of 07/29/2018 best identified by prior CT. No pleural effusion or pneumothorax. Bilateral breast implants. No acute bony abnormality. IMPRESSION: No acute cardiopulmonary disease. Electronically Signed   By: Marcello Moores  Register   On: 07/05/2021 06:29    Assessment/Plan  1. Pain and swelling of left lower leg Reports worsening calf muscle tenderness with swelling from foot area.concerned due to previous DVT post surgery.condition stable no shortness of breath. Patient seen late on Friday afternoon unable to send for outpatient ultrasound.she will go to ED for further evaluation.   2. Cat scratch of left lower leg, subsequent encounter Wound has healed.No redness,warmth or drainage.Has completed antibiotics. Swelling and leg pain as above.  Family/ staff Communication: Reviewed plan of care with patient verbalized understanding.   Labs/tests ordered: Advised to go to ED for evaluation of left leg pain and swelling   Next Appointment: As needed if symptoms worsen or fail to improve    Sandrea Hughs, NP

## 2021-07-15 ENCOUNTER — Emergency Department (HOSPITAL_BASED_OUTPATIENT_CLINIC_OR_DEPARTMENT_OTHER): Payer: Medicare Other

## 2021-07-15 ENCOUNTER — Encounter (HOSPITAL_BASED_OUTPATIENT_CLINIC_OR_DEPARTMENT_OTHER): Payer: Self-pay

## 2021-07-15 ENCOUNTER — Emergency Department (HOSPITAL_BASED_OUTPATIENT_CLINIC_OR_DEPARTMENT_OTHER)
Admission: EM | Admit: 2021-07-15 | Discharge: 2021-07-15 | Disposition: A | Discharge status: Home / Self Care | Payer: Medicare Other | Attending: Emergency Medicine | Admitting: Emergency Medicine

## 2021-07-15 DIAGNOSIS — Z7982 Long term (current) use of aspirin: Secondary | ICD-10-CM | POA: Diagnosis not present

## 2021-07-15 DIAGNOSIS — Z7951 Long term (current) use of inhaled steroids: Secondary | ICD-10-CM | POA: Insufficient documentation

## 2021-07-15 DIAGNOSIS — Z85828 Personal history of other malignant neoplasm of skin: Secondary | ICD-10-CM | POA: Insufficient documentation

## 2021-07-15 DIAGNOSIS — Z96653 Presence of artificial knee joint, bilateral: Secondary | ICD-10-CM | POA: Diagnosis not present

## 2021-07-15 DIAGNOSIS — Z8616 Personal history of COVID-19: Secondary | ICD-10-CM | POA: Insufficient documentation

## 2021-07-15 DIAGNOSIS — N183 Chronic kidney disease, stage 3 unspecified: Secondary | ICD-10-CM | POA: Insufficient documentation

## 2021-07-15 DIAGNOSIS — J45909 Unspecified asthma, uncomplicated: Secondary | ICD-10-CM | POA: Diagnosis not present

## 2021-07-15 DIAGNOSIS — M7989 Other specified soft tissue disorders: Secondary | ICD-10-CM | POA: Diagnosis not present

## 2021-07-15 DIAGNOSIS — R600 Localized edema: Secondary | ICD-10-CM | POA: Diagnosis not present

## 2021-07-15 DIAGNOSIS — S8992XA Unspecified injury of left lower leg, initial encounter: Secondary | ICD-10-CM | POA: Insufficient documentation

## 2021-07-15 DIAGNOSIS — W5503XA Scratched by cat, initial encounter: Secondary | ICD-10-CM | POA: Diagnosis not present

## 2021-07-15 MED ORDER — DOXYCYCLINE HYCLATE 100 MG PO CAPS
100.0000 mg | ORAL_CAPSULE | Freq: Two times a day (BID) | ORAL | 0 refills | Status: DC
Start: 2021-07-15 — End: 2021-09-18

## 2021-07-15 NOTE — Discharge Instructions (Addendum)
No sign of blood clots today.  You do have some swelling still in that leg which may just be a result of having cellulitis.  Try using a compression sock and elevating your leg more often when you can.  A prescription was sent to your pharmacy if you start having a lot more redness again or any drainage from the wound you can start another round of antibiotics.  However the way it looks right now do not feel that you need to start any antibiotics.

## 2021-07-15 NOTE — ED Triage Notes (Addendum)
Patient reports LLE pain and swelling s/p being scratched by her cat 3 weeks ago, patient has been seen twice and given antibiotics without improvement.  Patient also wants DVT ruled out d/t history of DVT/PE.  Patient also has a scratch to RLE without pain or inflammation.  Patient reports it was her cat and vaccinations are current.

## 2021-07-15 NOTE — ED Provider Notes (Signed)
[facility] Provider Note   CSN: [id number] Arrival date & time: 07/15/21  1047     History Chief Complaint  Patient presents with   Leg Pain    Faith Edwards is a 87 y.o. female.  The history is provided by the patient.  Leg Pain Location:  Leg Time since incident:  3 weeks Injury: yes   Mechanism of injury comment:  Scratched by her cat which then got infected Leg location:  L lower leg Pain details:    Quality:  Aching, cramping and throbbing   Radiates to:  Does not radiate   Severity:  Moderate   Onset quality:  Gradual   Duration:  3 weeks   Timing:  Constant   Progression:  Unchanged Chronicity:  New Prior injury to area:  Yes Relieved by:  Rest Worsened by:  Activity Ineffective treatments: has completed 2 courses of abx with improvement in the redness but pain and swelling has not gone away. Associated symptoms: swelling   Associated symptoms: no decreased ROM, no fever, no muscle weakness, no numbness and no stiffness   Associated symptoms comment:  No chest pain, SOB, fever Risk factors comment:  Hx of DVT/PE after surgery 10 years ago and not anticoagulated, arthritis, spinal stenosis     Past Medical History:  Diagnosis Date   Anxiety    Arthritis    Asthma    Bronchitis    hx of    Chronic headaches    [facility]   COVID-19    Cystitis    1954   Depression    Ear pain    bilat    Hiatal hernia    History of blood clots    Lungs, per [facility] new patient packet    History of blood transfusion    2010   Hx of blood clots 2007 & 2010   post TKR   Hyperthyroidism    Pelvic fracture (HCC)    hx of in three places secondary to fall    Pneumonia    hx of 1952   Pulmonary embolism ([place]) 1993   on HRT   Right wrist fracture    hx of 1995 has plate secondary to fall    Shaking    hands bilat   Shingles     per [facility] new patient packet    Shortness of breath dyspnea    Skin cancer    basal cell    Spinal  stenosis    Trauma    left lower leg 10/22/2014    Urinary frequency    Urticaria     Patient Active Problem List   Diagnosis Date Noted   Allergic rhinitis 10/13/2019   Multiple lung nodules on CT 07/29/2018   Other chest pain 07/28/2018   Prediabetes 05/09/2017   CKD (chronic kidney disease) stage 3, GFR 30-59 ml/min ([place]) 11/08/2016   Bursitis of right knee 08/07/2016   Herpes zoster 06/02/2016   Upper airway cough syndrome 06/01/2016   Hyperlipidemia 03/22/2016   Choking 03/15/2016   GERD (gastroesophageal reflux disease) 03/15/2016   Graves disease 01/04/2016   Osteopenia, severe 10/14/2015   Other constipation 09/01/2015   Incisional hernia 07/08/2015   Ventral hernia without obstruction or gangrene 05/12/2015   Lower back pain 05/12/2015   Spinal stenosis in cervical region 11/26/2013   Cough 03/07/2012   Depression 08/05/2008   Personal history of venous thrombosis and embolism 08/05/2008   Chronic rhinitis 12/29/2007   Asthma  12/29/2007    Past Surgical History:  Procedure Laterality Date   ABDOMINAL HYSTERECTOMY  1969    per [facility] new patient packet    [place]    per [facility] new patient packet    COLONOSCOPY     diverticulosis   ESI   11/2013    C7-T1 ; Dr Nelva Bush   EYE SURGERY     bilat cataract surgery    INSERTION OF MESH N/A 07/08/2015   Procedure: INSERTION OF MESH;  Surgeon: Armandina Gemma, MD;  Location: [facility];  Service: General;  Laterality: N/A;   REPLACEMENT TOTAL KNEE BILATERAL  2007 & 2010   SP FACET INJECTION  01/19/14   RC2-3,3-4,C4-5   TONSILLECTOMY  1945    per [facility] new patient packet    VENTRAL HERNIA REPAIR N/A 07/08/2015   Procedure: VENTRAL ADULT REPAIR VENTRAL INCISIONAL HERNIA REPAIR;  Surgeon: Armandina Gemma, MD;  Location: [facility];  Service: General;  Laterality: N/A;   WRIST FRACTURE SURGERY Bilateral 10  years and 2 years/   WRIST SURGERY Left 06/2016   plate      OB  History   No obstetric history on file.     Family History  Problem Relation Age of Onset   Allergies Brother    Asthma Brother    Clotting disorder Father        PTE X 2   Skin cancer Father    Heart failure Father        Per [place] New Patient Packet    Skin cancer Mother    Pneumonia Mother        1/2 lung loss per [place] New Patient Packet    Sudden death Other        2 M aunts & 2 M uncles in 56s   Heart attack Brother        > 78   Bladder Cancer Brother    Schizophrenia Son        Per [facility] New Patient Packet    Lung disease Son        Per [place] New Patient Packet    Mental illness Son        suicide by hanging   Diabetes Son    Hepatitis C Son    Diabetes Daughter    Migraines Daughter    Heart attack Daughter    Breast cancer Daughter    Stroke Neg Hx     Social History   Tobacco Use   Smoking status: Never   Smokeless tobacco: Never  Vaping Use   Vaping Use: Never used  Substance Use Topics   Alcohol use: Yes    Alcohol/week: 4.0 standard drinks    Types: 4 Glasses of wine per week    Comment: minimal   Drug use: No    Home Medications Prior to Admission medications   Medication Sig Start Date End Date Taking? Authorizing Provider  ascorbic acid (VITAMIN C) 500 MG tablet Take 500 mg by mouth daily.    [provider]  aspirin EC 81 MG tablet Take 81 mg by mouth daily.    [provider]  atorvastatin (LIPITOR) 20 MG tablet Take 1 tablet (20 mg total) by mouth daily. 01/05/21   Reed, Tiffany L, DO  cholecalciferol (VITAMIN D) 1000 UNITS tablet Take 1,000 Units by mouth daily.    [provider]  Cyanocobalamin (B-12) 3000 MCG  SUBL Place 1 tablet under the tongue daily.    [provider]  denosumab (PROLIA) 60 MG/ML SOSY injection Inject 60 mg into the skin every 6 (six) months.    [provider]  DULoxetine (CYMBALTA) 30 MG capsule Take 30 mg by mouth daily.  03/16/18   [provider]  famotidine  (PEPCID) 40 MG tablet TAKE 1 TABLET(40 MG) BY MOUTH AT BEDTIME 12/02/20   Ladene Artist, MD  metoprolol succinate (TOPROL-XL) 25 MG 24 hr tablet TAKE 1 TABLET(25 MG) BY MOUTH DAILY WITH OR IMMEDIATELY FOLLOWING A MEAL 04/24/21   Adrian Prows, MD  mupirocin cream (BACTROBAN) 2 % Apply 1 application topically 2 (two) times daily. 06/26/21   Lannie Fields, PA-C  mupirocin ointment (BACTROBAN) 2 % Apply topically 2 (two) times daily. 07/01/21   [provider]  Fluticasone-Salmeterol (ADVAIR) 100-50 MCG/DOSE AEPB Inhale 1 puff into the lungs 2 (two) times daily.  03/07/12  [provider]    Allergies    Penicillins and Sulfa antibiotics  Review of Systems   Review of Systems  Constitutional:  Negative for fever.  Musculoskeletal:  Negative for stiffness.  All other systems reviewed and are negative.  Physical Exam Updated Vital Signs BP (!) 174/48 (BP Location: Right Arm)   Pulse 73   Temp 98 F (36.7 C) (Oral)   Resp 18   Ht 5\' 3"  (1.6 m)   Wt 75.3 kg   SpO2 100%   BMI 29.41 kg/m   Physical Exam Vitals and nursing note reviewed.  Constitutional:      General: She is not in acute distress.    Appearance: Normal appearance. She is well-developed.  HENT:     Head: Normocephalic and atraumatic.  Eyes:     Pupils: Pupils are equal, round, and reactive to light.  Cardiovascular:     Rate and Rhythm: Normal rate.     Pulses: Normal pulses.  Pulmonary:     Effort: No respiratory distress.  Abdominal:     General: Bowel sounds are normal. There is no distension.     Palpations: Abdomen is soft.     Tenderness: There is no abdominal tenderness. There is no guarding or rebound.  Musculoskeletal:        General: Tenderness present. Normal range of motion.     Left lower leg: Edema present.       Legs:     Comments: Edema noted to the left lower leg most notable in the ankle.  Tenderness in the calf.  No medial thigh pain.  2+ DP pulse and normal cap refill   Skin:    General: Skin is warm and dry.     Findings: No rash.  Neurological:     Mental Status: She is alert and oriented to person, place, and time. Mental status is at baseline.     Cranial Nerves: No cranial nerve deficit.  Psychiatric:        Mood and Affect: Mood normal.        Behavior: Behavior normal.    ED Results / Procedures / Treatments   Labs (all labs ordered are listed, but only abnormal results are displayed) Labs Reviewed - No data to display  EKG None  Radiology US Venous Img Lower  Left (DVT Study)  Result Date: 07/15/2021 CLINICAL DATA:  Flank pain and swelling for 3 days. EXAM: LEFT LOWER EXTREMITY VENOUS DOPPLER ULTRASOUND TECHNIQUE: Gray-scale sonography with compression, as well as color and duplex ultrasound,  were performed to evaluate the deep venous system(s) from the level of the common femoral vein through the popliteal and proximal calf veins. COMPARISON:  05/25/2005 FINDINGS: VENOUS Normal compressibility of the common femoral, superficial femoral, and popliteal veins, as well as the visualized calf veins. Visualized portions of profunda femoral vein and great saphenous vein unremarkable. No filling defects to suggest DVT on grayscale or color Doppler imaging. Doppler waveforms show normal direction of venous flow, normal respiratory plasticity and response to augmentation. Limited views of the contralateral common femoral vein are unremarkable. OTHER In the LEFT MEDIAL popliteal fossa, there is irregular hypoechoic collection measuring 2.8 x 1.2 x 1.3 centimeters. Note is made of soft tissue edema in the calf. Limitations: none IMPRESSION: 1. No evidence for acute deep vein thrombosis of the LEFT LOWER extremity. 2. LEFT calf edema. 3. Small fluid in the LEFT popliteal fossa. Electronically Signed   By: Nolon Nations M.D.   On: 07/15/2021 12:38    Procedures Procedures   Medications Ordered in ED Medications - No data to display  ED Course  I have  reviewed the triage vital signs and the nursing notes.  Pertinent labs & imaging results that were available during my care of the patient were reviewed by me and considered in my medical decision making (see chart for details).    MDM Rules/Calculators/A&P                          Patient presenting with ongoing swelling of her left lower extremity now for 3 weeks.  Patient initially had been scratched deeply by her cat that then became infected.  She completed the initial round of antibiotics and was having ongoing redness that did trail up her leg.  She completed a second course of antibiotics and finished on Tuesday.  Since that time she has had ongoing pain and swelling to the leg.  At this time on exam she has no signs of infection.  There is no warmth, drainage or erythema noted.  She does have a small wound that appears to be healing well.  She is vascularly intact with a good pulse and capillary refill.  However given her prior history of PE and DVT, not being anticoagulated and having a wound and then ongoing swelling of her leg we will do a Doppler study to ensure no DVT.  No other injury or reason for fracture.  She denies any chest pain or shortness of breath concerning for PE.  Vital signs are reassuring.  1:24 PM Imaging is negative for DVT.  Patient does have some left calf edema and left popliteal fossa fluid collection.  On reevaluation there are no signs of persistent cellulitis at this time.  Discussed with patient wearing a compression sock and elevating her leg as she is very active and very often does not have time to sit around with her leg propped up.  She was given a prescription for an antibiotic to use as needed if she starts developing new redness or drainage but does not need any at this time.  MDM   Amount and/or Complexity of Data Reviewed Tests in the radiology section of CPT: ordered and reviewed Independent visualization of images, tracings, or specimens:  yes    Final Clinical Impression(s) / ED Diagnoses Final diagnoses:  None    Rx / DC Orders ED Discharge Orders     None        Blanchie Dessert, MD  07/15/21 1325  

## 2021-07-15 NOTE — ED Notes (Signed)
Pt provided discharge instructions and prescription information. Pt was given the opportunity to ask questions and questions were answered. Discharge signature not obtained in the setting of the COVID-19 pandemic in order to reduce high touch surfaces.  ° °

## 2021-07-21 ENCOUNTER — Encounter (HOSPITAL_BASED_OUTPATIENT_CLINIC_OR_DEPARTMENT_OTHER): Payer: Medicare Other | Attending: Internal Medicine | Admitting: Internal Medicine

## 2021-07-21 ENCOUNTER — Ambulatory Visit: Payer: Medicare Other | Admitting: Family

## 2021-07-21 ENCOUNTER — Other Ambulatory Visit: Payer: Self-pay

## 2021-07-21 DIAGNOSIS — W5503XA Scratched by cat, initial encounter: Secondary | ICD-10-CM | POA: Diagnosis not present

## 2021-07-21 DIAGNOSIS — Z880 Allergy status to penicillin: Secondary | ICD-10-CM | POA: Insufficient documentation

## 2021-07-21 DIAGNOSIS — Z8249 Family history of ischemic heart disease and other diseases of the circulatory system: Secondary | ICD-10-CM | POA: Insufficient documentation

## 2021-07-21 DIAGNOSIS — Z86718 Personal history of other venous thrombosis and embolism: Secondary | ICD-10-CM | POA: Diagnosis not present

## 2021-07-21 DIAGNOSIS — Z833 Family history of diabetes mellitus: Secondary | ICD-10-CM | POA: Diagnosis not present

## 2021-07-21 DIAGNOSIS — S80812A Abrasion, left lower leg, initial encounter: Secondary | ICD-10-CM | POA: Insufficient documentation

## 2021-07-21 DIAGNOSIS — Y939 Activity, unspecified: Secondary | ICD-10-CM | POA: Diagnosis not present

## 2021-07-21 DIAGNOSIS — L089 Local infection of the skin and subcutaneous tissue, unspecified: Secondary | ICD-10-CM | POA: Diagnosis not present

## 2021-07-21 DIAGNOSIS — I890 Lymphedema, not elsewhere classified: Secondary | ICD-10-CM | POA: Insufficient documentation

## 2021-07-21 DIAGNOSIS — Z882 Allergy status to sulfonamides status: Secondary | ICD-10-CM | POA: Insufficient documentation

## 2021-07-21 DIAGNOSIS — X58XXXA Exposure to other specified factors, initial encounter: Secondary | ICD-10-CM | POA: Insufficient documentation

## 2021-07-21 NOTE — Progress Notes (Signed)
Faith Edwards ([id number]) , Visit Report for 07/21/2021 Abuse/Suicide Risk Screen Details Patient Name: Date of Service: Faith Edwards, [place] NDA J. 07/21/2021 9:00 A M Medical Record Number: [id number] Patient Account Number: [id number] Date of Birth/Sex: Treating RN: October 18, 1932 (87 y.o. Faith Edwards Primary Care Jahmarion Popoff: Sherrie Mustache Other Clinician: Referring Dawson Hollman: Treating Malayasia Mirkin/Extender: Kalman Shan Ngetich, Dinah Weeks in Treatment: 0 Abuse/Suicide Risk Screen Items Answer ABUSE RISK SCREEN: Has anyone close to you tried to hurt or harm you recentlyo No Do you feel uncomfortable with anyone in your familyo No Has anyone forced you do things that you didnt want to doo No Electronic Signature(s) Signed: 07/21/2021 2:18:48 PM By: Deon Pilling Entered By: Deon Pilling on 07/21/2021 09:30:36 -------------------------------------------------------------------------------- Activities of Daily Living Details Patient Name: Date of Service: Faith Edwards NDA J. 07/21/2021 9:00 A M Medical Record Number: [id number] Patient Account Number: [id number] Date of Birth/Sex: Treating RN: 01/03/1932 (87 y.o. Faith Edwards Primary Care Chan Sheahan: Sherrie Mustache Other Clinician: Referring Imani Sherrin: Treating Lilliah Priego/Extender: Kalman Shan Ngetich, Dinah Weeks in Treatment: 0 Activities of Daily Living Items Answer Activities of Daily Living (Please select one for each item) Drive Automobile Completely Able T Medications ake Completely Able Use T elephone Completely Able Care for Appearance Completely Able Use T oilet Completely Able Bath / Shower Completely Able Dress Self Completely Able Feed Self Completely Able Walk Completely Able Get In / Out Bed Completely Able Housework Completely Able Prepare Meals Completely Able Handle Money Completely Able Shop for Self Completely Able Electronic Signature(s) Signed: 07/21/2021 2:18:48 PM By: Deon Pilling Entered By:  Deon Pilling on 07/21/2021 09:29:29 -------------------------------------------------------------------------------- Education Screening Details Patient Name: Date of Service: Faith Edwards, [place] NDA J. 07/21/2021 9:00 A M Medical Record Number: [id number] Patient Account Number: [id number] Date of Birth/Sex: Treating RN: May 21, 1932 (87 y.o. Helene Shoe, Tammi Klippel Primary Care Anvi Mangal: Sherrie Mustache Other Clinician: Referring Jermiyah Ricotta: Treating Kamran Coker/Extender: Kalman Shan Ngetich, Dinah Weeks in Treatment: 0 Primary Learner Assessed: Patient Learning Preferences/Education Level/Primary Language Learning Preference: Explanation, Demonstration, Printed Material Highest Education Level: College or Above Preferred Language: English Cognitive Barrier Language Barrier: No Translator Needed: No Memory Deficit: No Emotional Barrier: No Cultural/Religious Beliefs Affecting Medical Care: No Physical Barrier Impaired Vision: Yes Glasses, reading Impaired Hearing: No Decreased Hand dexterity: No Knowledge/Comprehension Knowledge Level: High Comprehension Level: High Ability to understand written instructions: High Ability to understand verbal instructions: High Motivation Anxiety Level: Calm Cooperation: Cooperative Education Importance: Acknowledges Need Interest in Health Problems: Asks Questions Perception: Coherent Willingness to Engage in Self-Management High Activities: Readiness to Engage in Self-Management High Activities: Electronic Signature(s) Signed: 07/21/2021 2:18:48 PM By: Deon Pilling Entered By: Deon Pilling on 07/21/2021 09:29:56 -------------------------------------------------------------------------------- Fall Risk Assessment Details Patient Name: Date of Service: Faith Edwards, [place] NDA J. 07/21/2021 9:00 A M Medical Record Number: [id number] Patient Account Number: [id number] Date of Birth/Sex: Treating RN: 1932-09-29 (87 y.o. Helene Shoe, Tammi Klippel Primary Care  Alahni Varone: Sherrie Mustache Other Clinician: Referring Ashrith Sagan: Treating Alizee Maple/Extender: Kalman Shan Ngetich, Dinah Weeks in Treatment: 0 Fall Risk Assessment Items Have you had 2 or more falls in the last 12 monthso 0 No Have you had any fall that resulted in injury in the last 12 monthso 0 No FALLS RISK SCREEN History of falling - immediate or within 3 months 0 No Secondary diagnosis (Do you have 2 or more medical diagnoseso) 0 No Ambulatory aid None/bed rest/wheelchair/nurse 0 Yes Crutches/cane/walker 0 No Furniture 0 No Intravenous therapy Access/Saline/Heparin Lock 0 No Gait/Transferring Normal/ bed rest/ wheelchair  0 Yes Weak (short steps with or without shuffle, stooped but able to lift head while walking, may seek 0 No support from furniture) Impaired (short steps with shuffle, may have difficulty arising from chair, head down, impaired 0 No balance) Mental Status Oriented to own ability 0 Yes Electronic Signature(s) Signed: 07/21/2021 2:18:48 PM By: Deon Pilling Entered By: Deon Pilling on 07/21/2021 09:30:03 -------------------------------------------------------------------------------- Foot Assessment Details Patient Name: Date of Service: Faith Edwards, [place] NDA J. 07/21/2021 9:00 A M Medical Record Number: [id number] Patient Account Number: [id number] Date of Birth/Sex: Treating RN: 1932-12-22 (87 y.o. Faith Edwards Primary Care Jaylend Reiland: Sherrie Mustache Other Clinician: Referring Zalia Hautala: Treating Chonte Ricke/Extender: Kalman Shan Ngetich, Dinah Weeks in Treatment: 0 Foot Assessment Items Site Locations + = Sensation present, - = Sensation absent, C = Callus, U = Ulcer R = Redness, W = Warmth, M = Maceration, PU = Pre-ulcerative lesion F = Fissure, S = Swelling, D = Dryness Assessment Right: Left: Other Deformity: No No Prior Foot Ulcer: No No Prior Amputation: No No Charcot Joint: No No Ambulatory Status: Ambulatory Without Help Gait:  Steady Electronic Signature(s) Signed: 07/21/2021 2:18:48 PM By: Deon Pilling Entered By: Deon Pilling on 07/21/2021 09:45:42 -------------------------------------------------------------------------------- Nutrition Risk Screening Details Patient Name: Date of Service: Faith Edwards NDA J. 07/21/2021 9:00 A M Medical Record Number: [id number] Patient Account Number: [id number] Date of Birth/Sex: Treating RN: [birth year] (87 y.o. Helene Shoe, Tammi Klippel Primary Care Landyn Buckalew: Sherrie Mustache Other Clinician: Referring Rhyder Koegel: Treating Keelen Quevedo/Extender: Kalman Shan Ngetich, Dinah Weeks in Treatment: 0 Height (in): 64 Weight (lbs): 150 Body Mass Index (BMI): 25.7 Nutrition Risk Screening Items Score Screening NUTRITION RISK SCREEN: I have an illness or condition that made me change the kind and/or amount of food I eat 2 Yes I eat fewer than two meals per day 0 No I eat few fruits and vegetables, or milk products 0 No I have three or more drinks of beer, liquor or wine almost every day 0 No I have tooth or mouth problems that make it hard for me to eat 0 No I don't always have enough money to buy the food I need 0 No I eat alone most of the time 0 No I take three or more different prescribed or over-the-counter drugs a day 1 Yes Without wanting to, I have lost or gained 10 pounds in the last six months 0 No I am not always physically able to shop, cook and/or feed myself 0 No Nutrition Protocols Good Risk Protocol Moderate Risk Protocol 0 Provide education on nutrition High Risk Proctocol Risk Level: Moderate Risk Score: 3 Electronic Signature(s) Signed: 07/21/2021 2:18:48 PM By: Deon Pilling Entered By: Deon Pilling on 07/21/2021 09:30:24

## 2021-07-21 NOTE — Progress Notes (Addendum)
Faith Edwards ([id number]) , Visit Report for 07/21/2021 Allergy List Details Patient Name: Date of Service: Faith Edwards, [place] NDA J. 07/21/2021 9:00 A M Medical Record Number: [id number] Patient Account Number: [id number] Date of Birth/Sex: Treating RN: [birth year] (87 y.o. Debby Bud Primary Care Leea Rambeau: Sherrie Mustache Other Clinician: Referring Shaquon Gropp: Treating Korianna Washer/Extender: Kalman Shan Ngetich, Dinah Weeks in Treatment: 0 Allergies Active Allergies penicillin Sulfa (Sulfonamide Antibiotics) Allergy Notes Electronic Signature(s) Signed: 07/21/2021 2:18:48 PM By: Deon Pilling Entered By: Deon Pilling on 07/21/2021 09:28:38 -------------------------------------------------------------------------------- Arrival Information Details Patient Name: Date of Service: Faith Edwards, [place] NDA J. 07/21/2021 9:00 A M Medical Record Number: [id number] Patient Account Number: [id number] Date of Birth/Sex: Treating RN: [birth year] (87 y.o. Helene Shoe, Tammi Klippel Primary Care Almando Brawley: Sherrie Mustache Other Clinician: Referring Betzaira Mentel: Treating Monica Zahler/Extender: Kalman Shan Ngetich, Dinah Weeks in Treatment: 0 Visit Information Patient Arrived: Ambulatory Arrival Time: 09:21 Accompanied By: self Transfer Assistance: None Patient Identification Verified: Yes Secondary Verification Process Completed: Yes Patient Requires Transmission-Based Precautions: No Patient Has Alerts: No History Since Last Visit Added or deleted any medications: No Any new allergies or adverse reactions: No Had a fall or experienced change in activities of daily living that may affect risk of falls: No Signs or symptoms of abuse/neglect since last visito No Hospitalized since last visit: No Implantable device outside of the clinic excluding cellular tissue based products placed in the center since last visit: No Pain Present Now: No Notes per patient 3 weeks ago scratched by cat went to ED and urgent  care. Per patient has had three rounds of oral abx, currently on doxycycline, apply bactroban ointment to left leg wound area, and wearing compression stockings. MD and case manager made aware. Electronic Signature(s) Signed: 07/21/2021 2:18:48 PM By: Deon Pilling Entered By: Deon Pilling on 07/21/2021 09:54:19 -------------------------------------------------------------------------------- Clinic Level of Care Assessment Details Patient Name: Date of Service: Faith Edwards, [place] NDA J. 07/21/2021 9:00 A M Medical Record Number: [id number] Patient Account Number: [id number] Date of Birth/Sex: Treating RN: [birth year] (87 y.o. Faith Edwards Primary Care Lillionna [name]: Sherrie Mustache Other Clinician: Referring Symphany Fleissner: Treating Ferrel Simington/Extender: Kalman Shan Ngetich, Dinah Weeks in Treatment: 0 Clinic Level of Care Assessment Items TOOL 2 Quantity Score []  - 0 Use when only an EandM is performed on the INITIAL visit ASSESSMENTS - Nursing Assessment / Reassessment X- 1 20 General Physical Exam (combine w/ comprehensive assessment (listed just below) when performed on new pt. evals) X- 1 25 Comprehensive Assessment (HX, ROS, Risk Assessments, Wounds Hx, etc.) ASSESSMENTS - Wound and Skin A ssessment / Reassessment []  - 0 Simple Wound Assessment / Reassessment - one wound []  - 0 Complex Wound Assessment / Reassessment - multiple wounds X- 1 10 Dermatologic / Skin Assessment (not related to wound area) ASSESSMENTS - Ostomy and/or Continence Assessment and Care []  - 0 Incontinence Assessment and Management []  - 0 Ostomy Care Assessment and Management (repouching, etc.) PROCESS - Coordination of Care X - Simple Patient / Family Education for ongoing care 1 15 []  - 0 Complex (extensive) Patient / Family Education for ongoing care X- 1 10 Staff obtains Programmer, systems, Records, T Results / Process Orders est []  - 0 Staff telephones HHA, Nursing Homes / Clarify orders / etc []  - 0 Routine  Transfer to another Facility (non-emergent condition) []  - 0 Routine Hospital Admission (non-emergent condition) X- 1 15 New Admissions / Biomedical engineer / Ordering NPWT Apligraf, etc. , []  - 0 Emergency Hospital Admission (emergent condition) X- 1 10 Simple Discharge  Coordination []  - 0 Complex (extensive) Discharge Coordination PROCESS - Special Needs []  - 0 Pediatric / Minor Patient Management []  - 0 Isolation Patient Management []  - 0 Hearing / Language / Visual special needs []  - 0 Assessment of Community assistance (transportation, D/C planning, etc.) []  - 0 Additional assistance / Altered mentation []  - 0 Support Surface(s) Assessment (bed, cushion, seat, etc.) INTERVENTIONS - Wound Cleansing / Measurement []  - 0 Wound Imaging (photographs - any number of wounds) []  - 0 Wound Tracing (instead of photographs) []  - 0 Simple Wound Measurement - one wound []  - 0 Complex Wound Measurement - multiple wounds []  - 0 Simple Wound Cleansing - one wound []  - 0 Complex Wound Cleansing - multiple wounds INTERVENTIONS - Wound Dressings []  - 0 Small Wound Dressing one or multiple wounds []  - 0 Medium Wound Dressing one or multiple wounds []  - 0 Large Wound Dressing one or multiple wounds []  - 0 Application of Medications - injection INTERVENTIONS - Miscellaneous []  - 0 External ear exam []  - 0 Specimen Collection (cultures, biopsies, blood, body fluids, etc.) []  - 0 Specimen(s) / Culture(s) sent or taken to Lab for analysis []  - 0 Patient Transfer (multiple staff / Civil Service fast streamer / Similar devices) []  - 0 Simple Staple / Suture removal (25 or less) []  - 0 Complex Staple / Suture removal (26 or more) []  - 0 Hypo / Hyperglycemic Management (close monitor of Blood Glucose) X- 1 15 Ankle / Brachial Index (ABI) - do not check if billed separately Has the patient been seen at the hospital within the last three years: Yes Total Score: 120 Level Of Care:  New/Established - Level 4 Electronic Signature(s) Signed: 07/21/2021 1:37:28 PM By: Baruch Gouty RN, BSN Entered By: Baruch Gouty on 07/21/2021 10:21:03 -------------------------------------------------------------------------------- Encounter Discharge Information Details Patient Name: Date of Service: Faith Edwards, [place] NDA J. 07/21/2021 9:00 A M Medical Record Number: [id number] Patient Account Number: [id number] Date of Birth/Sex: Treating RN: [birth year] (87 y.o. Faith Edwards Primary Care Shuan Statzer: Sherrie Mustache Other Clinician: Referring Takeesha Isley: Treating Daziah Hesler/Extender: Kalman Shan Ngetich, Dinah Weeks in Treatment: 0 Encounter Discharge Information Items Discharge Condition: Stable Ambulatory Status: Ambulatory Discharge Destination: Home Transportation: Private Auto Accompanied By: spouse Schedule Follow-up Appointment: Yes Clinical Summary of Care: Patient Declined Electronic Signature(s) Signed: 07/21/2021 1:37:28 PM By: Baruch Gouty RN, BSN Entered By: Baruch Gouty on 07/21/2021 10:27:05 -------------------------------------------------------------------------------- Lower Extremity Assessment Details Patient Name: Date of Service: Faith Edwards, [place] NDA J. 07/21/2021 9:00 A M Medical Record Number: [id number] Patient Account Number: [id number] Date of Birth/Sex: Treating RN: Apr 15, 1932 (87 y.o. Helene Shoe, Tammi Klippel Primary Care Allysia Ingles: Sherrie Mustache Other Clinician: Referring Naylani Bradner: Treating Erickson Yamashiro/Extender: Kalman Shan Ngetich, Dinah Weeks in Treatment: 0 Edema Assessment Assessed: [Left: Yes] [Right: Yes] Edema: [Left: Yes] [Right: No] Calf Left: Right: Point of Measurement: 31 cm From Medial Instep 35 cm 33 cm Ankle Left: Right: Point of Measurement: 9 cm From Medial Instep 23 cm 22 cm Knee To Floor Left: Right: From Medial Instep 45 cm 45 cm Vascular Assessment Pulses: Dorsalis Pedis Palpable: [Left:Yes] [Right:Yes] Doppler  Audible: [Left:Yes] [Right:Yes] Posterior Tibial Palpable: [Left:Yes] [Right:Yes] Doppler Audible: [Left:Yes] [Right:Yes] Blood Pressure: Brachial: [Left:151] [Right:151] Ankle: [Left:Dorsalis Pedis: 128 0.85] [Right:Dorsalis Pedis: 120 0.79] Electronic Signature(s) Signed: 07/21/2021 2:18:48 PM By: Deon Pilling Entered By: Deon Pilling on 07/21/2021 09:44:14 -------------------------------------------------------------------------------- Multi Wound Chart Details Patient Name: Date of Service: Faith Edwards, [place]uss[place] NDA J. 07/21/2021 9:00 A M Medical Record Number: [id number] Patient Account Number: [id number]  Date of Birth/Sex: Treating RN: Jun 28, 1932 (87 y.o. Faith Edwards Primary Care Hadassah Rana: Sherrie Mustache Other Clinician: Referring Makalyn Lennox: Treating Nahuel Wilbert/Extender: Kalman Shan Ngetich, Dinah Weeks in Treatment: 0 Vital Signs Height(in): 64 Pulse(bpm): 90 Weight(lbs): 150 Blood Pressure(mmHg): 151/81 Body Mass Index(BMI): 26 Temperature(F): 98.4 Respiratory Rate(breaths/min): 20 Wound Assessments Treatment Notes Electronic Signature(s) Signed: 07/31/2021 2:22:03 PM By: Kalman Shan DO Signed: 08/03/2021 5:39:10 PM By: Baruch Gouty RN, BSN Signed: 08/03/2021 5:39:10 PM By: Baruch Gouty RN, BSN Entered By: Kalman Shan on 07/28/2021 14:17:42 -------------------------------------------------------------------------------- Pain Assessment Details Patient Name: Date of Service: Faith Edwards, [place] NDA J. 07/21/2021 9:00 A M Medical Record Number: [id number] Patient Account Number: [id number] Date of Birth/Sex: Treating RN: [birth year] (87 y.o. Debby Bud Primary Care Raseel Jans: Sherrie Mustache Other Clinician: Referring Josseline Reddin: Treating Nakeesha Bowler/Extender: Kalman Shan Ngetich, Dinah Weeks in Treatment: 0 Active Problems Location of Pain Severity and Description of Pain Patient Has Paino No Site Locations Rate the pain. Current Pain Level:  0 Pain Management and Medication Current Pain Management: Medication: No Cold Application: No Rest: No Massage: No Activity: No T.E.N.S.: No Heat Application: No Leg drop or elevation: No Is the Current Pain Management Adequate: Adequate How does your wound impact your activities of daily livingo Sleep: No Bathing: No Appetite: No Relationship With Others: No Bladder Continence: No Emotions: No Bowel Continence: No Work: No Toileting: No Drive: No Dressing: No Hobbies: No Notes soreness to each side of leg when rubbed on each side. Electronic Signature(s) Signed: 07/21/2021 2:18:48 PM By: Deon Pilling Entered By: Deon Pilling on 07/21/2021 09:44:45 -------------------------------------------------------------------------------- Patient/Caregiver Education Details Patient Name: Date of Service: Faith Edwards, [place] 7/22/2022andnbsp9:00 [place] Record Number: [id number] Patient Account Number: [id number] Date of Birth/Gender: Treating RN: 03/29/1932 (87 y.o. Faith Edwards Primary Care Physician: Sherrie Mustache Other Clinician: Referring Physician: Treating Physician/Extender: Elsworth Soho in Treatment: 0 Education Assessment Education Provided To: Patient Education Topics Provided Wound/Skin Impairment: Methods: Explain/Verbal Responses: Reinforcements needed, State content correctly Electronic Signature(s) Signed: 07/21/2021 1:37:28 PM By: Baruch Gouty RN, BSN Entered By: Baruch Gouty on 07/21/2021 10:20:17 -------------------------------------------------------------------------------- [place] Details Patient Name: Date of Service: Faith Edwards, [place] NDA J. 07/21/2021 9:00 A M Medical Record Number: [id number] Patient Account Number: [id number] Date of Birth/Sex: Treating RN: 1932-08-27 (87 y.o. Helene Shoe, Tammi Klippel Primary Care Carrye Goller: Sherrie Mustache Other Clinician: Referring Josedejesus Marcum: Treating Makye Radle/Extender: Kalman Shan Ngetich, Dinah Weeks in Treatment: 0 Vital Signs Time Taken: 09:20 Temperature (F): 98.4 Height (in): 64 Pulse (bpm): 90 Source: Stated Respiratory Rate (breaths/min): 20 Weight (lbs): 150 Blood Pressure (mmHg): 151/81 Source: Stated Reference Range: 80 - 120 mg / dl Body Mass Index (BMI): 25.7 Electronic Signature(s) Signed: 07/21/2021 2:18:48 PM By: Deon Pilling Entered By: Deon Pilling on 07/21/2021 09:29:15

## 2021-07-31 NOTE — Progress Notes (Signed)
Faith Edwards ([id number]) , Visit Report for 07/21/2021 Chief Complaint Document Details Patient Name: Date of Service: Faith Edwards, [place] NDA J. 07/21/2021 9:00 A M Medical Record Number: [id number] Patient Account Number: [id number] Date of Birth/Sex: Treating RN: 1932/05/13 (87 y.o. [name] Primary Care Provider: Sherrie Mustache Other Clinician: Referring Provider: Treating Provider/Extender: Kalman Shan Ngetich, Dinah Weeks in Treatment: 0 Information Obtained from: Patient Chief Complaint Healed wound from a cat scratch to her left leg Electronic Signature(s) Signed: 07/31/2021 2:22:03 PM By: Kalman Shan DO Entered By: Kalman Shan on 07/28/2021 14:17:56 -------------------------------------------------------------------------------- HPI Details Patient Name: Date of Service: Faith Edwards, [place] NDA J. 07/21/2021 9:00 A M Medical Record Number: [id number] Patient Account Number: [id number] Date of Birth/Sex: Treating RN: 11/30/1932 (87 y.o. [name] Primary Care Provider: Sherrie Mustache Other Clinician: Referring Provider: Treating Provider/Extender: Kalman Shan Ngetich, Dinah Weeks in Treatment: 0 History of Present Illness HPI Description: 7/22 [name] is an 87 year old female with a past medical history of depression that presents to the clinic for evaluation of a previous cat scratch to her left leg. She states this happened about 1 month ago. She was evaluated in the ED when it first occurred and was prescribed azithromycin and mupirocin cream. She was again reevaluated in the ED 2 weeks after her initial injury. She had a DVT study that was negative. At that time she was given doxycycline. Over the past 2 weeks she reports improvement in wound healing and she no longer has a wound present. Electronic Signature(s) Signed: 07/31/2021 2:22:03 PM By: Kalman Shan DO Entered By: Kalman Shan on 07/28/2021  14:21:22 -------------------------------------------------------------------------------- Physical Exam Details Patient Name: Date of Service: Faith Edwards, [place] NDA J. 07/21/2021 9:00 A M Medical Record Number: [id number] Patient Account Number: [id number] Date of Birth/Sex: Treating RN: 28-Mar-1932 (87 y.o. [name] Primary Care Provider: Sherrie Mustache Other Clinician: Referring Provider: Treating Provider/Extender: Kalman Shan Ngetich, Dinah Weeks in Treatment: 0 Constitutional respirations regular, non-labored and within target range for patient.. Cardiovascular 2+ dorsalis pedis/posterior tibialis pulses. Psychiatric pleasant and cooperative. Notes No open wounds to the left leg. No signs of infection. Electronic Signature(s) Signed: 07/31/2021 2:22:03 PM By: Kalman Shan DO Entered By: Kalman Shan on 07/28/2021 14:21:53 -------------------------------------------------------------------------------- Physician Orders Details Patient Name: Date of Service: Faith Edwards, [place] NDA J. 07/21/2021 9:00 A M Medical Record Number: [id number] Patient Account Number: [id number] Date of Birth/Sex: Treating RN: Sep 07, 1932 (87 y.o. [name] Primary Care Provider: Sherrie Mustache Other Clinician: Referring Provider: Treating Provider/Extender: Kalman Shan Ngetich, Dinah Weeks in Treatment: 0 Verbal / Phone Orders: No Diagnosis Coding ICD-10 Coding Code Description W55.03XA Scratched by cat, initial encounter Discharge From [facility] Services Discharge from [place] Bathing/ Shower/ Hygiene May shower and wash wound with soap and water. Edema Control - Lymphedema / SCD / Other Bilateral Lower Extremities Elevate legs to the level of the heart or above for 30 minutes daily and/or when sitting, a frequency of: - whenever sitting throughout the day Avoid standing for long periods of time. Patient to wear own compression stockings every day. Exercise  regularly Moisturize legs daily. Non Wound Condition Left Lower Extremity pply the following to affected area as directed: - continue bactroban and cover with bandaid daily for one more week A Electronic Signature(s) Signed: 07/31/2021 2:22:03 PM By: Kalman Shan DO Previous Signature: 07/21/2021 1:37:28 PM Version By: Baruch Gouty RN, BSN Entered By: Kalman Shan on 07/28/2021 14:22:25 -------------------------------------------------------------------------------- Problem List Details Patient Name: Date of  Service: Faith Edwards, [place] NDA J. 07/21/2021 9:00 A M Medical Record Number: [id number] Patient Account Number: [id number] Date of Birth/Sex: Treating RN: 1932/06/04 (87 y.o. [name] Primary Care Provider: Sherrie Mustache Other Clinician: Referring Provider: Treating Provider/Extender: Kalman Shan Ngetich, Dinah Weeks in Treatment: 0 Active Problems ICD-10 Encounter Code Description Active Date MDM Diagnosis L08.9 Local infection of the skin and subcutaneous tissue, unspecified 07/21/2021 No Yes W55.03XA Scratched by cat, initial encounter 07/21/2021 No Yes Inactive Problems Resolved Problems Electronic Signature(s) Signed: 07/31/2021 2:22:03 PM By: Kalman Shan DO Entered By: Kalman Shan on 07/31/2021 14:21:15 -------------------------------------------------------------------------------- Progress Note Details Patient Name: Date of Service: Faith Edwards, [place] NDA J. 07/21/2021 9:00 A M Medical Record Number: [id number] Patient Account Number: [id number] Date of Birth/Sex: Treating RN: [birth year] (87 y.o. [name] Primary Care Provider: Sherrie Mustache Other Clinician: Referring Provider: Treating Provider/Extender: Kalman Shan Ngetich, Dinah Weeks in Treatment: 0 Subjective Chief Complaint Information obtained from Patient Healed wound from a cat scratch to her left leg History of Present Illness (HPI) 7/22 [name] is an  87 year old female with a past medical history of depression that presents to the clinic for evaluation of a previous cat scratch to her left leg. She states this happened about 1 month ago. She was evaluated in the ED when it first occurred and was prescribed azithromycin and mupirocin cream. She was again reevaluated in the ED 2 weeks after her initial injury. She had a DVT study that was negative. At that time she was given doxycycline. Over the past 2 weeks she reports improvement in wound healing and she no longer has a wound present. Patient History Information obtained from Patient. Allergies penicillin, Sulfa (Sulfonamide Antibiotics) Family History Cancer - Mother,Siblings, Diabetes - Child, Heart Disease - Father,Child, Lung Disease - Child, No family history of Hereditary Spherocytosis, Hypertension, Kidney Disease, Seizures, Stroke, Thyroid Problems, Tuberculosis. Social History Never smoker, Marital Status - Married, Alcohol Use - Daily - 4 glasses a week, Drug Use - No History, Caffeine Use - Never. Medical History Eyes Denies history of Cataracts, Glaucoma, Optic Neuritis Ear/Nose/Mouth/Throat Denies history of Chronic sinus problems/congestion, Middle ear problems Hematologic/Lymphatic Denies history of Anemia, Hemophilia, Human Immunodeficiency Virus, Lymphedema, Sickle Cell Disease Respiratory Patient has history of Asthma Denies history of Aspiration, Chronic Obstructive Pulmonary Disease (COPD), Pneumothorax, Sleep Apnea, Tuberculosis Cardiovascular Patient has history of Deep Vein Thrombosis Denies history of Angina, Arrhythmia, Congestive Heart Failure, Coronary Artery Disease, Hypertension, Hypotension, Myocardial Infarction, Peripheral Arterial Disease, Peripheral Venous Disease, Phlebitis, Vasculitis Gastrointestinal Denies history of Cirrhosis , Colitis, Crohnoos, Hepatitis A, Hepatitis B, Hepatitis C Endocrine Denies history of Type I Diabetes, Type II  Diabetes Genitourinary Denies history of End Stage Renal Disease Immunological Denies history of Lupus Erythematosus, Raynaudoos, Scleroderma Musculoskeletal Patient has history of Osteoarthritis Denies history of Gout, Rheumatoid Arthritis, Osteomyelitis Neurologic Denies history of Dementia, Neuropathy, Quadriplegia, Paraplegia, Seizure Disorder Oncologic Denies history of Received Chemotherapy, Received Radiation Psychiatric Denies history of Anorexia/bulimia, Confinement Anxiety Hospitalization/Surgery History - left foot fracture 3 months ago. - bilateral knee replacements. - DVT and PEs. s Medical A Surgical History Notes nd Respiratory PE lung nodules Musculoskeletal spinal stenosis Review of Systems (ROS) Constitutional Symptoms (General Health) Denies complaints or symptoms of Fatigue, Fever, Chills, Marked Weight Change. Eyes Complains or has symptoms of Glasses / Contacts - glasses. Ear/Nose/Mouth/Throat Denies complaints or symptoms of Chronic sinus problems or rhinitis. Respiratory Denies complaints or symptoms of Chronic or frequent coughs, Shortness of Breath. Cardiovascular Denies complaints  or symptoms of Chest pain. Gastrointestinal Denies complaints or symptoms of Frequent diarrhea, Nausea, Vomiting. Endocrine Denies complaints or symptoms of Heat/cold intolerance. Genitourinary Denies complaints or symptoms of Frequent urination. Integumentary (Skin) Complains or has symptoms of Wounds - left leg. Musculoskeletal Denies complaints or symptoms of Muscle Pain, Muscle Weakness. Neurologic Denies complaints or symptoms of Numbness/parasthesias. Psychiatric Denies complaints or symptoms of Claustrophobia, Suicidal. Objective Constitutional respirations regular, non-labored and within target range for patient.. Vitals Time Taken: 9:20 AM, Height: 64 in, Source: Stated, Weight: 150 lbs, Source: Stated, BMI: 25.7, Temperature: 98.4 F, Pulse: 90 bpm,  Respiratory Rate: 20 breaths/min, Blood Pressure: 151/81 mmHg. Cardiovascular 2+ dorsalis pedis/posterior tibialis pulses. Psychiatric pleasant and cooperative. General Notes: No open wounds to the left leg. No signs of infection. Assessment Active Problems ICD-10 Scratched by cat, initial encounter Patient has healed well from a previous cat scratch to her left leg. She has had 2 rounds of antibiotics. On exam everything appears closed without signs of infection. I recommended she place some antibiotic ointment to the previous wound site and keep it covered for the next week to help With continued wound healing. She can follow-up as needed. Plan Discharge From [facility] Services: Discharge from [place] Bathing/ Shower/ Hygiene: May shower and wash wound with soap and water. Edema Control - Lymphedema / SCD / Other: Elevate legs to the level of the heart or above for 30 minutes daily and/or when sitting, a frequency of: - whenever sitting throughout the day Avoid standing for long periods of time. Patient to wear own compression stockings every day. Exercise regularly Moisturize legs daily. Non Wound Condition: Apply the following to affected area as directed: - continue bactroban and cover with bandaid daily for one more week 1. Follow-up as needed 2. Keep Previous wound site covered and protected for the next week Electronic Signature(s) Signed: 07/31/2021 2:22:03 PM By: Kalman Shan DO Entered By: Kalman Shan on 07/28/2021 14:24:24 -------------------------------------------------------------------------------- HxROS Details Patient Name: Date of Service: Faith Edwards, [place] NDA J. 07/21/2021 9:00 A M Medical Record Number: [id number] Patient Account Number: [id number] Date of Birth/Sex: Treating RN: 01/21/1932 (87 y.o. Faith Edwards Primary Care Provider: Sherrie Mustache Other Clinician: Referring Provider: Treating Provider/Extender: Kalman Shan Ngetich,  Dinah Weeks in Treatment: 0 Information Obtained From Patient Constitutional Symptoms (General Health) Complaints and Symptoms: Negative for: Fatigue; Fever; Chills; Marked Weight Change Eyes Complaints and Symptoms: Positive for: Glasses / Contacts - glasses Medical History: Negative for: Cataracts; Glaucoma; Optic Neuritis Ear/Nose/Mouth/Throat Complaints and Symptoms: Negative for: Chronic sinus problems or rhinitis Medical History: Negative for: Chronic sinus problems/congestion; Middle ear problems Respiratory Complaints and Symptoms: Negative for: Chronic or frequent coughs; Shortness of Breath Medical History: Positive for: Asthma Negative for: Aspiration; Chronic Obstructive Pulmonary Disease (COPD); Pneumothorax; Sleep Apnea; Tuberculosis Past Medical History Notes: PE lung nodules Cardiovascular Complaints and Symptoms: Negative for: Chest pain Medical History: Positive for: Deep Vein Thrombosis Negative for: Angina; Arrhythmia; Congestive Heart Failure; Coronary Artery Disease; Hypertension; Hypotension; Myocardial Infarction; Peripheral Arterial Disease; Peripheral Venous Disease; Phlebitis; Vasculitis Gastrointestinal Complaints and Symptoms: Negative for: Frequent diarrhea; Nausea; Vomiting Medical History: Negative for: Cirrhosis ; Colitis; Crohns; Hepatitis A; Hepatitis B; Hepatitis C Endocrine Complaints and Symptoms: Negative for: Heat/cold intolerance Medical History: Negative for: Type I Diabetes; Type II Diabetes Genitourinary Complaints and Symptoms: Negative for: Frequent urination Medical History: Negative for: End Stage Renal Disease Integumentary (Skin) Complaints and Symptoms: Positive for: Wounds - left leg Musculoskeletal Complaints and Symptoms: Negative for: Muscle Pain;  Muscle Weakness Medical History: Positive for: Osteoarthritis Negative for: Gout; Rheumatoid Arthritis; Osteomyelitis Past Medical History Notes: spinal  stenosis Neurologic Complaints and Symptoms: Negative for: Numbness/parasthesias Medical History: Negative for: Dementia; Neuropathy; Quadriplegia; Paraplegia; Seizure Disorder Psychiatric Complaints and Symptoms: Negative for: Claustrophobia; Suicidal Medical History: Negative for: Anorexia/bulimia; Confinement Anxiety Hematologic/Lymphatic Medical History: Negative for: Anemia; Hemophilia; Human Immunodeficiency Virus; Lymphedema; Sickle Cell Disease Immunological Medical History: Negative for: Lupus Erythematosus; Raynauds; Scleroderma Oncologic Medical History: Negative for: Received Chemotherapy; Received Radiation Immunizations Pneumococcal Vaccine: Received Pneumococcal Vaccination: No Implantable Devices No devices added Hospitalization / Surgery History Type of Hospitalization/Surgery left foot fracture 3 months ago bilateral knee replacements DVT and PEs s Family and Social History Cancer: Yes - Mother,Siblings; Diabetes: Yes - Child; Heart Disease: Yes - Father,Child; Hereditary Spherocytosis: No; Hypertension: No; Kidney Disease: No; Lung Disease: Yes - Child; Seizures: No; Stroke: No; Thyroid Problems: No; Tuberculosis: No; Never smoker; Marital Status - Married; Alcohol Use: Daily - 4 glasses a week; Drug Use: No History; Caffeine Use: Never; Financial Concerns: No; Food, Clothing or Shelter Needs: No; Support System Lacking: No; Transportation Concerns: No Electronic Signature(s) Signed: 07/21/2021 2:18:48 PM By: Deon Pilling Signed: 07/31/2021 2:22:03 PM By: Kalman Shan DO Entered By: Deon Pilling on 07/21/2021 09:36:29 -------------------------------------------------------------------------------- SuperBill Details Patient Name: Date of Service: Faith Edwards, [place] NDA J. 07/21/2021 Medical Record Number: [id number] Patient Account Number: [id number] Date of Birth/Sex: Treating RN: 22-May-1932 (87 y.o. [name] Primary Care Provider: Sherrie Mustache Other Clinician: Referring Provider: Treating Provider/Extender: Kalman Shan Ngetich, Dinah Weeks in Treatment: 0 Diagnosis Coding ICD-10 Codes Code Description W55.03XA Scratched by Neurosurgeon, initial encounter Facility Procedures CPT4 Code: [id number] Description: [phone] - WOUND CARE VISIT-LEV 4 EST PT Modifier: Quantity: 1 Physician Procedures Electronic Signature(s) Signed: 07/31/2021 2:22:03 PM By: Kalman Shan DO Previous Signature: 07/21/2021 1:37:28 PM Version By: Baruch Gouty RN, BSN Entered By: Kalman Shan on 07/28/2021 14:24:53

## 2021-08-07 ENCOUNTER — Ambulatory Visit (INDEPENDENT_AMBULATORY_CARE_PROVIDER_SITE_OTHER): Payer: Medicare Other | Admitting: Podiatry

## 2021-08-07 ENCOUNTER — Other Ambulatory Visit: Payer: Self-pay

## 2021-08-07 ENCOUNTER — Encounter: Payer: Self-pay | Admitting: Podiatry

## 2021-08-07 ENCOUNTER — Ambulatory Visit (INDEPENDENT_AMBULATORY_CARE_PROVIDER_SITE_OTHER): Payer: Medicare Other

## 2021-08-07 DIAGNOSIS — I209 Angina pectoris, unspecified: Secondary | ICD-10-CM

## 2021-08-07 DIAGNOSIS — S92352D Displaced fracture of fifth metatarsal bone, left foot, subsequent encounter for fracture with routine healing: Secondary | ICD-10-CM | POA: Diagnosis not present

## 2021-08-07 DIAGNOSIS — S92352A Displaced fracture of fifth metatarsal bone, left foot, initial encounter for closed fracture: Secondary | ICD-10-CM | POA: Diagnosis not present

## 2021-08-14 NOTE — Progress Notes (Signed)
Subjective: 87 year old female presents the office today for follow-up evaluation of left foot fifth metatarsal fracture.  She states she is doing much better and not having significant tenderness.  She has been walking for exercise without significant pain or swelling.  For the last saw her she did get scratched by her cat and she is on antibiotics and treated for this.  She does have some residual swelling from that issue but improving as well.  Denies any systemic complaints such as fevers, chills, nausea, vomiting. No acute changes since last appointment, and no other complaints at this time.   Objective: AAO x3, NAD DP/PT pulses palpable bilaterally, CRT less than 3 seconds There is no tenderness palpation of the fifth metatarsal particularly on the left foot.  There is no other areas of pinpoint tenderness identified today.  There is some edema present but there is no erythema or warmth.  There is no open lesions today. No open lesions or pre-ulcerative lesions.  No pain with calf compression, swelling, warmth, erythema  Assessment: Healing fifth metatarsal fracture left side  Plan: -All treatment options discussed with the patient including all alternatives, risks, complications.  -Repeat x-rays obtained and reviewed with her.  There is healing fracture noted fifth metatarsal. -She is back to her regular shoe and already increased with her walking.  Continue to exercise and walk as tolerated.  Continue low impact activities.  If there is any increase in pain return to the operating she will let me know. -Patient encouraged to call the office with any questions, concerns, change in symptoms.   Faith Edwards DPM

## 2021-09-15 ENCOUNTER — Encounter (HOSPITAL_BASED_OUTPATIENT_CLINIC_OR_DEPARTMENT_OTHER): Payer: Self-pay | Admitting: *Deleted

## 2021-09-15 ENCOUNTER — Other Ambulatory Visit: Payer: Self-pay

## 2021-09-15 ENCOUNTER — Emergency Department (HOSPITAL_BASED_OUTPATIENT_CLINIC_OR_DEPARTMENT_OTHER)
Admission: EM | Admit: 2021-09-15 | Discharge: 2021-09-15 | Disposition: A | Discharge status: Home / Self Care | Payer: Medicare Other | Attending: Emergency Medicine | Admitting: Emergency Medicine

## 2021-09-15 DIAGNOSIS — W19XXXA Unspecified fall, initial encounter: Secondary | ICD-10-CM | POA: Diagnosis not present

## 2021-09-15 DIAGNOSIS — S81819A Laceration without foreign body, unspecified lower leg, initial encounter: Secondary | ICD-10-CM | POA: Insufficient documentation

## 2021-09-15 DIAGNOSIS — Z5321 Procedure and treatment not carried out due to patient leaving prior to being seen by health care provider: Secondary | ICD-10-CM | POA: Insufficient documentation

## 2021-09-15 DIAGNOSIS — S8990XA Unspecified injury of unspecified lower leg, initial encounter: Secondary | ICD-10-CM | POA: Diagnosis present

## 2021-09-15 NOTE — ED Triage Notes (Signed)
Leg injury today. She leaned forward and fell. Skin tear to her lower leg.

## 2021-09-15 NOTE — ED Notes (Signed)
Name called x 2 - no answer

## 2021-09-18 ENCOUNTER — Ambulatory Visit (INDEPENDENT_AMBULATORY_CARE_PROVIDER_SITE_OTHER): Payer: Medicare Other | Admitting: Nurse Practitioner

## 2021-09-18 ENCOUNTER — Other Ambulatory Visit: Payer: Self-pay

## 2021-09-18 ENCOUNTER — Encounter: Payer: Self-pay | Admitting: Nurse Practitioner

## 2021-09-18 VITALS — BP 130/70 | HR 84 | Ht 63.0 in | Wt 165.0 lb

## 2021-09-18 DIAGNOSIS — S80811A Abrasion, right lower leg, initial encounter: Secondary | ICD-10-CM | POA: Diagnosis not present

## 2021-09-18 DIAGNOSIS — I209 Angina pectoris, unspecified: Secondary | ICD-10-CM | POA: Diagnosis not present

## 2021-09-18 NOTE — Progress Notes (Signed)
Careteam: Patient Care Team: Lauree Chandler, NP as PCP - General (Geriatric Medicine) Tanda Rockers, MD as Consulting Physician (Pulmonary Disease) Marica Otter, [place] (Optometry) Norma Fredrickson, MD as Consulting Physician (Psychiatry) Philemon Kingdom, MD as Consulting Physician (Internal Medicine) Leatha Gilding, MD as Referring Physician (Orthopedic Surgery) Harold Hedge, Darrick Grinder, MD as Consulting Physician (Allergy and Immunology) Ladene Artist, MD as Consulting Physician (Gastroenterology) Pedro Earls, MD as Attending Physician (Family Medicine) Melida Quitter, MD as Consulting Physician (Otolaryngology)  PLACE OF SERVICE:  [place]  Advanced Directive information    Allergies  Allergen Reactions   Penicillins Hives   Sulfa Antibiotics Hives and Nausea And Vomiting    7-15    Chief Complaint  Patient presents with   Acute Visit    Fall x 2 days ago. Went to ER in [facility] and decided to leave after a 2 hour week. High fall risk.      HPI: Patient is a 87 y.o. female to follow up after fall Stepped into garden and hit leg on a piece of wood She also got scratched by a cat a few weeks ago and was given mupirocin and has been adding that to the area.   Review of Systems:  Review of Systems  Constitutional:  Negative for chills, fever, malaise/fatigue and weight loss.  Skin:  Negative for itching and rash.       Lower leg wound    Past Medical History:  Diagnosis Date   Anxiety    Arthritis    Asthma    Bronchitis    hx of    Chronic headaches    [facility]ache Clinic   COVID-19    Cystitis    1954   Depression    Ear pain    bilat    Hiatal hernia    History of blood clots    Lungs, per [place] new patient packet    History of blood transfusion    2010   Hx of blood clots 2007 & 2010   post TKR   Hyperthyroidism    Pelvic fracture (HCC)    hx of in three places secondary to fall    Pneumonia    hx of 1952   Pulmonary embolism ([place])  1993   on HRT   Right wrist fracture    hx of 1995 has plate secondary to fall    Shaking    hands bilat   Shingles     per [place] new patient packet    Shortness of breath dyspnea    Skin cancer    basal cell    Spinal stenosis    Trauma    left lower leg 10/22/2014    Urinary frequency    Urticaria    Past Surgical History:  Procedure Laterality Date   ABDOMINAL HYSTERECTOMY  1969    per [facility] new patient packet    [place]    per [facility] new patient packet    COLONOSCOPY     diverticulosis   ESI   11/2013    C7-T1 ; Dr Nelva Bush   EYE SURGERY     bilat cataract surgery    INSERTION OF MESH N/A 07/08/2015   Procedure: INSERTION OF MESH;  Surgeon: Armandina Gemma, MD;  Location: [facility];  Service: General;  Laterality: N/A;   REPLACEMENT TOTAL KNEE BILATERAL  2007 & 2010  SP FACET INJECTION  01/19/14   RC2-3,3-4,C4-5   TONSILLECTOMY  1945    per [facility] new patient packet    VENTRAL HERNIA REPAIR N/A 07/08/2015   Procedure: VENTRAL ADULT REPAIR VENTRAL INCISIONAL HERNIA REPAIR;  Surgeon: Armandina Gemma, MD;  Location: [facility];  Service: General;  Laterality: N/A;   WRIST FRACTURE SURGERY Bilateral 10  years and 2 years/   WRIST SURGERY Left 06/2016   plate    Social History:   reports that she has never smoked. She has never used smokeless tobacco. She reports current alcohol use of about 4.0 standard drinks per week. She reports that she does not use drugs.  Family History  Problem Relation Age of Onset   Allergies Brother    Asthma Brother    Clotting disorder Father        PTE X 2   Skin cancer Father    Heart failure Father        Per [place] New Patient Packet    Skin cancer Mother    Pneumonia Mother        1/2 lung loss per [place] New Patient Packet    Sudden death Other        2 M aunts & 2 M uncles in 10s   Heart attack Brother        > 95   Bladder Cancer Brother    Schizophrenia Son        Per [place] New Patient  Packet    Lung disease Son        Per [place] New Patient Packet    Mental illness Son        suicide by hanging   Diabetes Son    Hepatitis C Son    Diabetes Daughter    Migraines Daughter    Heart attack Daughter    Breast cancer Daughter    Stroke Neg Hx     Medications: Patient's Medications  New Prescriptions   No medications on file  Previous Medications   ASCORBIC ACID (VITAMIN C) 500 MG TABLET    Take 500 mg by mouth daily.   ATORVASTATIN (LIPITOR) 20 MG TABLET    Take 1 tablet (20 mg total) by mouth daily.   CHOLECALCIFEROL (VITAMIN D) 1000 UNITS TABLET    Take 1,000 Units by mouth daily.   CYANOCOBALAMIN (B-12) 3000 MCG SUBL    Place 1 tablet under the tongue daily.   DENOSUMAB (PROLIA) 60 MG/ML SOSY INJECTION    Inject 60 mg into the skin every 6 (six) months.   DULOXETINE (CYMBALTA) 30 MG CAPSULE    Take 30 mg by mouth daily.    FAMOTIDINE (PEPCID) 40 MG TABLET    TAKE 1 TABLET(40 MG) BY MOUTH AT BEDTIME   METOPROLOL SUCCINATE (TOPROL-XL) 25 MG 24 HR TABLET    TAKE 1 TABLET(25 MG) BY MOUTH DAILY WITH OR IMMEDIATELY FOLLOWING A MEAL   MUPIROCIN CREAM (BACTROBAN) 2 %    Apply 1 application topically 2 (two) times daily.  Modified Medications   No medications on file  Discontinued Medications   ASPIRIN EC 81 MG TABLET    Take 81 mg by mouth daily.   DOXYCYCLINE (VIBRAMYCIN) 100 MG CAPSULE    Take 1 capsule (100 mg total) by mouth 2 (two) times daily.   MUPIROCIN OINTMENT (BACTROBAN) 2 %    Apply topically 2 (two) times daily.    Physical Exam:  Vitals:   09/18/21 1454  BP: 130/70  Pulse:  84  Weight: 165 lb (74.8 kg)  Height: 5\' 3"  (1.6 m)   Body mass index is 29.23 kg/m. Wt Readings from Last 3 Encounters:  09/18/21 165 lb (74.8 kg)  09/15/21 166 lb 0.1 oz (75.3 kg)  07/15/21 166 lb (75.3 kg)    Physical Exam Constitutional:      General: She is not in acute distress.    Appearance: She is well-developed. She is not diaphoretic.  HENT:     Head:  Normocephalic and atraumatic.     Mouth/Throat:     Pharynx: No oropharyngeal exudate.  Eyes:     Conjunctiva/sclera: Conjunctivae normal.     Pupils: Pupils are equal, round, and reactive to light.  Musculoskeletal:     Right lower leg: No edema.     Left lower leg: No edema.  Skin:    General: Skin is warm and dry.     Findings: Abrasion present.          Comments: 2.5 x 5 cm abrasion noted to right lower leg. Red center. No erythema or drainage noted.   Neurological:     Mental Status: She is alert.  Psychiatric:        Mood and Affect: Mood normal.    Labs reviewed: Basic Metabolic Panel: Recent Labs    10/20/20 0816 01/30/21 1601  NA 140  --   K 5.1  --   CL 105  --   CO2 26  --   GLUCOSE 107*  --   BUN 25  --   CREATININE 1.02*  --   CALCIUM 9.7  --   TSH 4.63* 5.86*   Liver Function Tests: Recent Labs    10/20/20 0816  AST 18  ALT 14  BILITOT 1.1  PROT 6.7   No results for input(s): LIPASE, AMYLASE in the last 8760 hours. No results for input(s): AMMONIA in the last 8760 hours. CBC: Recent Labs    10/20/20 0816  WBC 6.4  NEUTROABS 3,194  HGB 13.4  HCT 40.6  MCV 96.0  PLT 267   Lipid Panel: Recent Labs    10/20/20 0816  CHOL 177  HDL 76  LDLCALC 85  TRIG 70  CHOLHDL 2.3   TSH: Recent Labs    10/20/20 0816 01/30/21 1601  TSH 4.63* 5.86*   A1C: Lab Results  Component Value Date   HGBA1C 5.6 01/05/2021     Assessment/Plan 1. Abrasion of anterior right lower leg, initial encounter -no signs of infection or complications.  -xeroform applied today and pt given instructions to change every 3 days or sooner if needed (becomes soiled, wet or dry) -to notify if healing is delayed or signs of infections (which was reviewed)   Carlos American. [place], [facility] [phone]

## 2021-09-18 NOTE — Patient Instructions (Signed)
To use xerform gauze- change every 3 days or sooner if becomes dry. To apply nonstick over.  To continue until area heals.

## 2021-09-21 ENCOUNTER — Telehealth: Payer: Self-pay | Admitting: *Deleted

## 2021-09-21 ENCOUNTER — Encounter: Payer: Self-pay | Admitting: Adult Health

## 2021-09-21 ENCOUNTER — Other Ambulatory Visit: Payer: Self-pay

## 2021-09-21 ENCOUNTER — Ambulatory Visit (INDEPENDENT_AMBULATORY_CARE_PROVIDER_SITE_OTHER): Payer: Medicare Other | Admitting: Adult Health

## 2021-09-21 VITALS — BP 138/71 | HR 100 | Temp 97.7°F | Resp 16 | Ht 63.0 in | Wt 165.4 lb

## 2021-09-21 DIAGNOSIS — I209 Angina pectoris, unspecified: Secondary | ICD-10-CM

## 2021-09-21 DIAGNOSIS — L03115 Cellulitis of right lower limb: Secondary | ICD-10-CM | POA: Insufficient documentation

## 2021-09-21 DIAGNOSIS — Z23 Encounter for immunization: Secondary | ICD-10-CM | POA: Diagnosis not present

## 2021-09-21 MED ORDER — DOXYCYCLINE HYCLATE 100 MG PO TABS
100.0000 mg | ORAL_TABLET | Freq: Two times a day (BID) | ORAL | 0 refills | Status: AC
Start: 2021-09-21 — End: 2021-10-01

## 2021-09-21 NOTE — Telephone Encounter (Signed)
Patient called and left message on Clinical intake stating that she was seen on Tuesday for Leg Wound.  Stated that she should have been given something for infection then but didn't get anything.  Stated that it is red and infected and she needs something called to her pharmacy.    Tried calling patient back to get more information.  LMOM to return call.

## 2021-09-21 NOTE — Telephone Encounter (Signed)
Patient called and scheduled an appointment for today to evaluate and get Flu injection.

## 2021-09-21 NOTE — Progress Notes (Signed)
Location:  [facility]   Place of Service:   clinic    CODE STATUS:   Allergies  Allergen Reactions   Penicillins Hives   Sulfa Antibiotics Hives and Nausea And Vomiting    7-15    Chief Complaint  Patient presents with   Acute Visit    Complains of right leg infection.     HPI:  She was seen on the 19th of sept status post fall with an abrasion on her right lower leg. She did treat the area with abt oint and left open to air per her daughter's instruction. Since that time she has noted increased redness and warmth present. There are no fevers present   Past Medical History:  Diagnosis Date   Anxiety    Arthritis    Asthma    Bronchitis    hx of    Chronic headaches    Headache Clinic   COVID-19    Cystitis    1954   Depression    Ear pain    bilat    Hiatal hernia    History of blood clots    Lungs, per [place] new patient packet    History of blood transfusion    2010   Hx of blood clots 2007 & 2010   post TKR   Hyperthyroidism    Pelvic fracture (HCC)    hx of in three places secondary to fall    Pneumonia    hx of 1952   Pulmonary embolism ([place]) 1993   on HRT   Right wrist fracture    hx of 1995 has plate secondary to fall    Shaking    hands bilat   Shingles     per [place] new patient packet    Shortness of breath dyspnea    Skin cancer    basal cell    Spinal stenosis    Trauma    left lower leg 10/22/2014    Urinary frequency    Urticaria     Past Surgical History:  Procedure Laterality Date   ABDOMINAL HYSTERECTOMY  1969    per [facility] new patient packet    [place]    per [facility] new patient packet    COLONOSCOPY     diverticulosis   ESI   11/2013    C7-T1 ; Dr Nelva Bush   EYE SURGERY     bilat cataract surgery    INSERTION OF MESH N/A 07/08/2015   Procedure: INSERTION OF MESH;  Surgeon: Armandina Gemma, MD;  [facility];  Service: General;  Laterality: N/A;    REPLACEMENT TOTAL KNEE BILATERAL  2007 & 2010   SP FACET INJECTION  01/19/14   RC2-3,3-4,C4-5   TONSILLECTOMY  1945    per [facility] new patient packet    VENTRAL HERNIA REPAIR N/A 07/08/2015   Procedure: VENTRAL ADULT REPAIR VENTRAL INCISIONAL HERNIA REPAIR;  Surgeon: Armandina Gemma, MD;  Location[facility];  Service: General;  Laterality: N/A;   WRIST FRACTURE SURGERY Bilateral 10  years and 2 years/   WRIST SURGERY Left 06/2016   plate     Social History   Socioeconomic History   Marital status: Married    Spouse name: Not on file   Number of children: 3   Years of education: Not on file   Highest education level: Not on file  Occupational History   Occupation: retired  Tobacco Use   Smoking status: Never   Smokeless tobacco: Never  Vaping Use   Vaping Use: Never used  Substance and Sexual Activity   Alcohol use: Yes    Alcohol/week: 4.0 standard drinks    Types: 4 Glasses of wine per week    Comment: minimal   Drug use: No   Sexual activity: Not Currently  Other Topics Concern   Not on file  Social History Narrative   Per [place] Patient Packet, abstracted on 06/06/2020:      Diet: Keto Diet x 1 month, no red meat       Caffeine: Yes, coffee      Married, if yes what year: Yes, 1990 (last, 3rd)      Do you live in a house, apartment, assisted living, condo, trailer, ect: Hose      Is it one or more stories: 3 stories, 1 person       Pets: 1 Neurosurgeon       Current/Past profession: Gaffer, 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 No   Do you have difficulty preparing food or eating? No   Do you have difficulty managing your medications? No   Do you have difficulty managing your finances? No   Do you have  difficulty affording your medications? No   Social Determinants of Radio broadcast assistant Strain: Not on file  Food Insecurity: Not on file  Transportation Needs: Not on file  Physical Activity: Not on file  Stress: Not on file  Social Connections: Not on file  Intimate Partner Violence: Not on file   Family History  Problem Relation Age of Onset   Allergies Brother    Asthma Brother    Clotting disorder Father        PTE X 2   Skin cancer Father    Heart failure Father        Per [place] New Patient Packet    Skin cancer Mother    Pneumonia Mother        1/2 lung loss per [facility] New Patient Packet    Sudden death Other        2 M aunts & 2 M uncles in 77s   Heart attack Brother        > 60   Bladder Cancer Brother    Schizophrenia Son        Per [place] New Patient Packet    Lung disease Son        Per [place] New Patient Packet    Mental illness Son        suicide by hanging   Diabetes Son    Hepatitis C Son    Diabetes Daughter    Migraines Daughter    Heart attack Daughter    Breast cancer Daughter    Stroke Neg Hx       VITAL SIGNS BP 138/71   Pulse 100   Temp 97.7 F (36.5 C)   Resp 16   Ht 5\' 3"  (1.6 m)  Wt 165 lb 6.4 oz (75 kg)   SpO2 91%   BMI 29.30 kg/m   Outpatient Encounter Medications as of 09/21/2021  Medication Sig   ascorbic acid (VITAMIN C) 500 MG tablet Take 500 mg by mouth daily.   atorvastatin (LIPITOR) 20 MG tablet Take 1 tablet (20 mg total) by mouth daily.   cholecalciferol (VITAMIN D) 1000 UNITS tablet Take 1,000 Units by mouth daily.   Cyanocobalamin (B-12) 3000 MCG SUBL Place 1 tablet under the tongue daily.   denosumab (PROLIA) 60 MG/ML SOSY injection Inject 60 mg into the skin every 6 (six) months.   DULoxetine (CYMBALTA) 30 MG capsule Take 30 mg by mouth daily.    famotidine (PEPCID) 40 MG tablet TAKE 1 TABLET(40 MG) BY MOUTH AT BEDTIME   metoprolol succinate (TOPROL-XL) 25 MG 24 hr tablet TAKE 1 TABLET(25 MG) BY MOUTH DAILY WITH  OR IMMEDIATELY FOLLOWING A MEAL   mupirocin cream (BACTROBAN) 2 % Apply 1 application topically 2 (two) times daily.   [DISCONTINUED] Fluticasone-Salmeterol (ADVAIR) 100-50 MCG/DOSE AEPB Inhale 1 puff into the lungs 2 (two) times daily.   No facility-administered encounter medications on file as of 09/21/2021.     SIGNIFICANT DIAGNOSTIC EXAMS   Review of Systems  Constitutional:  Negative for malaise/fatigue.  Respiratory:  Negative for cough and shortness of breath.   Cardiovascular:  Negative for chest pain, palpitations and leg swelling.  Gastrointestinal:  Negative for abdominal pain, constipation and heartburn.  Musculoskeletal:  Negative for back pain, joint pain and myalgias.  Skin:        Abrasion right lower leg   Neurological:  Negative for dizziness.  Psychiatric/Behavioral:  The patient is not nervous/anxious.     Physical Exam Constitutional:      General: She is not in acute distress.    Appearance: She is well-developed. She is not diaphoretic.  Neck:     Thyroid: No thyromegaly.  Cardiovascular:     Rate and Rhythm: Normal rate and regular rhythm.     Heart sounds: Normal heart sounds.  Pulmonary:     Effort: Pulmonary effort is normal. No respiratory distress.     Breath sounds: Normal breath sounds.  Abdominal:     General: Bowel sounds are normal. There is no distension.     Palpations: Abdomen is soft.     Tenderness: There is no abdominal tenderness.  Musculoskeletal:        General: Normal range of motion.     Cervical back: Neck supple.     Right lower leg: No edema.     Left lower leg: No edema.  Lymphadenopathy:     Cervical: No cervical adenopathy.  Skin:    General: Skin is warm and dry.     Comments: Right lower leg: abrasion area is red; warm to touch with serous drainage  present. Is using abt oint daily and is leaving open to air. Per her daughter's instructions.   Neurological:     Mental Status: She is alert and oriented to person,  place, and time.     ASSESSMENT/ PLAN:  TODAY  Right lower extremity cellulitis: is worse will begin doxycycline 100 mg twice daily for 10 days. She has been instructed to eat yogurt twice daily while on abt. She has verbalized understanding. She will continue to use abt oint on her wound. She has been instructed to contact the clinic if there is no improvement or her leg gets worse. She has verbalized understanding of all instruction  given.    Ok Edwards NP [facility]  Contact [phone] Monday through Friday 8am- 5pm  After hours call [phone]

## 2021-09-27 ENCOUNTER — Other Ambulatory Visit: Payer: Self-pay

## 2021-09-27 ENCOUNTER — Other Ambulatory Visit (HOSPITAL_COMMUNITY): Payer: Self-pay

## 2021-09-27 ENCOUNTER — Emergency Department (HOSPITAL_BASED_OUTPATIENT_CLINIC_OR_DEPARTMENT_OTHER): Payer: Medicare Other

## 2021-09-27 ENCOUNTER — Encounter (HOSPITAL_BASED_OUTPATIENT_CLINIC_OR_DEPARTMENT_OTHER): Payer: Self-pay | Admitting: Emergency Medicine

## 2021-09-27 ENCOUNTER — Emergency Department (HOSPITAL_BASED_OUTPATIENT_CLINIC_OR_DEPARTMENT_OTHER)
Admission: EM | Admit: 2021-09-27 | Discharge: 2021-09-27 | Disposition: A | Discharge status: Home / Self Care | Payer: Medicare Other | Attending: Emergency Medicine | Admitting: Emergency Medicine

## 2021-09-27 DIAGNOSIS — W19XXXA Unspecified fall, initial encounter: Secondary | ICD-10-CM | POA: Insufficient documentation

## 2021-09-27 DIAGNOSIS — J45909 Unspecified asthma, uncomplicated: Secondary | ICD-10-CM | POA: Insufficient documentation

## 2021-09-27 DIAGNOSIS — Z96653 Presence of artificial knee joint, bilateral: Secondary | ICD-10-CM | POA: Insufficient documentation

## 2021-09-27 DIAGNOSIS — Z8616 Personal history of COVID-19: Secondary | ICD-10-CM | POA: Insufficient documentation

## 2021-09-27 DIAGNOSIS — S81801A Unspecified open wound, right lower leg, initial encounter: Secondary | ICD-10-CM

## 2021-09-27 DIAGNOSIS — L03115 Cellulitis of right lower limb: Secondary | ICD-10-CM | POA: Insufficient documentation

## 2021-09-27 DIAGNOSIS — Y92007 Garden or yard of unspecified non-institutional (private) residence as the place of occurrence of the external cause: Secondary | ICD-10-CM | POA: Diagnosis not present

## 2021-09-27 DIAGNOSIS — Z79899 Other long term (current) drug therapy: Secondary | ICD-10-CM | POA: Insufficient documentation

## 2021-09-27 DIAGNOSIS — L03119 Cellulitis of unspecified part of limb: Secondary | ICD-10-CM

## 2021-09-27 DIAGNOSIS — S8991XA Unspecified injury of right lower leg, initial encounter: Secondary | ICD-10-CM | POA: Diagnosis present

## 2021-09-27 DIAGNOSIS — Z96651 Presence of right artificial knee joint: Secondary | ICD-10-CM | POA: Diagnosis not present

## 2021-09-27 DIAGNOSIS — N183 Chronic kidney disease, stage 3 unspecified: Secondary | ICD-10-CM | POA: Insufficient documentation

## 2021-09-27 DIAGNOSIS — Z7951 Long term (current) use of inhaled steroids: Secondary | ICD-10-CM | POA: Insufficient documentation

## 2021-09-27 DIAGNOSIS — Z4800 Encounter for change or removal of nonsurgical wound dressing: Secondary | ICD-10-CM | POA: Insufficient documentation

## 2021-09-27 DIAGNOSIS — Z85828 Personal history of other malignant neoplasm of skin: Secondary | ICD-10-CM | POA: Insufficient documentation

## 2021-09-27 DIAGNOSIS — Z5189 Encounter for other specified aftercare: Secondary | ICD-10-CM

## 2021-09-27 DIAGNOSIS — M810 Age-related osteoporosis without current pathological fracture: Secondary | ICD-10-CM | POA: Diagnosis not present

## 2021-09-27 DIAGNOSIS — S81809A Unspecified open wound, unspecified lower leg, initial encounter: Secondary | ICD-10-CM | POA: Diagnosis not present

## 2021-09-27 LAB — CBC WITH DIFFERENTIAL/PLATELET
Abs Immature Granulocytes: 0.01 10*3/uL (ref 0.00–0.07)
Basophils Absolute: 0 10*3/uL (ref 0.0–0.1)
Basophils Relative: 0 %
Eosinophils Absolute: 0.1 10*3/uL (ref 0.0–0.5)
Eosinophils Relative: 2 %
HCT: 38.3 % (ref 36.0–46.0)
Hemoglobin: 12.5 g/dL (ref 12.0–15.0)
Immature Granulocytes: 0 %
Lymphocytes Relative: 23 %
Lymphs Abs: 1.7 10*3/uL (ref 0.7–4.0)
MCH: 31.6 pg (ref 26.0–34.0)
MCHC: 32.6 g/dL (ref 30.0–36.0)
MCV: 97 fL (ref 80.0–100.0)
Monocytes Absolute: 0.7 10*3/uL (ref 0.1–1.0)
Monocytes Relative: 10 %
Neutro Abs: 4.8 10*3/uL (ref 1.7–7.7)
Neutrophils Relative %: 65 %
Platelets: 275 10*3/uL (ref 150–400)
RBC: 3.95 MIL/uL (ref 3.87–5.11)
RDW: 13 % (ref 11.5–15.5)
WBC: 7.4 10*3/uL (ref 4.0–10.5)
nRBC: 0 % (ref 0.0–0.2)

## 2021-09-27 LAB — COMPREHENSIVE METABOLIC PANEL
ALT: 25 U/L (ref 0–44)
AST: 27 U/L (ref 15–41)
Albumin: 4.1 g/dL (ref 3.5–5.0)
Alkaline Phosphatase: 61 U/L (ref 38–126)
Anion gap: 7 (ref 5–15)
BUN: 28 mg/dL — ABNORMAL HIGH (ref 8–23)
CO2: 25 mmol/L (ref 22–32)
Calcium: 9.3 mg/dL (ref 8.9–10.3)
Chloride: 104 mmol/L (ref 98–111)
Creatinine, Ser: 1.04 mg/dL — ABNORMAL HIGH (ref 0.44–1.00)
GFR, Estimated: 52 mL/min — ABNORMAL LOW (ref >60)
Glucose, Bld: 105 mg/dL — ABNORMAL HIGH (ref 70–99)
Potassium: 4.9 mmol/L (ref 3.5–5.1)
Sodium: 136 mmol/L (ref 135–145)
Total Bilirubin: 0.9 mg/dL (ref 0.3–1.2)
Total Protein: 7 g/dL (ref 6.5–8.1)

## 2021-09-27 LAB — C-REACTIVE PROTEIN: CRP: 0.5 mg/dL (ref <1.0)

## 2021-09-27 LAB — SEDIMENTATION RATE: Sed Rate: 21 mm/hr (ref 0–22)

## 2021-09-27 MED ORDER — IOHEXOL 350 MG/ML SOLN
100.0000 mL | Freq: Once | INTRAVENOUS | Status: AC | PRN
  Administered 2021-09-27: 85 mL via INTRAVENOUS

## 2021-09-27 MED ORDER — DEXTROSE 5 % IV SOLN
1500.0000 mg | Freq: Once | INTRAVENOUS | Status: AC
  Administered 2021-09-27: 1500 mg via INTRAVENOUS
  Filled 2021-09-27 (×2): qty 75

## 2021-09-27 NOTE — ED Notes (Signed)
No complaints at this time.

## 2021-09-27 NOTE — ED Triage Notes (Signed)
Pt sts wound to RLE is not improving; sts she fell onto the wooden boarder of her garden on 09/15/21.

## 2021-09-27 NOTE — ED Provider Notes (Signed)
  Physical Exam  BP (!) 151/76 (BP Location: Right Arm)   Pulse 80   Temp 98 F (36.7 C) (Oral)   Resp 16   Ht 5\' 4"  (1.626 m)   Wt 68 kg   SpO2 100%   BMI 25.75 kg/m   Physical Exam Vitals and nursing note reviewed.  Constitutional:      General: She is not in acute distress.    Appearance: She is well-developed.  HENT:     Head: Normocephalic and atraumatic.  Eyes:     Conjunctiva/sclera: Conjunctivae normal.  Cardiovascular:     Rate and Rhythm: Normal rate and regular rhythm.     Heart sounds: No murmur heard. Pulmonary:     Effort: Pulmonary effort is normal. No respiratory distress.     Breath sounds: Normal breath sounds.  Abdominal:     Palpations: Abdomen is soft.     Tenderness: There is no abdominal tenderness.  Musculoskeletal:     Cervical back: Neck supple.  Skin:    General: Skin is warm and dry.     Comments: RLE cellulitis   Neurological:     Mental Status: She is alert.    ED Course/Procedures   Clinical Course as of 09/27/21 2355  Wed Sep 27, 2021  1623 Pending dalvance infusion then dc [MK]    Clinical Course User Index [MK] Acy Orsak, Debe Coder, MD    Procedures  MDM    Patient received in handoff.  Right lower extremity cellulitis resistant to outpatient antibiotics.  Plan for single dose dalbavancin and discharged with ID follow-up.  Patient received this infusion and then was discharged with outpatient ID follow-up.        Teressa Lower, MD 09/27/21 [phone]

## 2021-09-27 NOTE — ED Provider Notes (Signed)
[facility] EMERGENCY DEPARTMENT Provider Note   CSN: [id number] Arrival date & time: 09/27/21  1111     History Chief Complaint  Patient presents with   Wound Check    Faith Edwards is a 87 y.o. female.   Wound Check Pertinent negatives include no chest pain, no abdominal pain, no headaches and no shortness of breath. Patient presents for right lower extremity wound.  Injury occurred 12 days ago.  At the time, she had fallen onto a wooden board in her garden.  She had reportedly come to the ED but left without being seen.  She was seen in clinic 3 days later.  At that time, an abrasion was noted to her right lower leg.  There was no erythema or drainage noted.  Xeroform was applied with instructions to change dressing every 3 days or sooner.  3 days after that, on 9/22, patient was again seen in clinic.  Physical exam findings at that time showed right lower extremity cellulitis.  She was started on doxycycline.  Today, she reports worsening extension of the area of erythema, pain, and tenderness.  She denies any systemic symptoms.  She has been ambulating without difficulty.  She denies any worsening pain with ambulation.  She does report intermittent shooting pains in the area of the wound.     Past Medical History:  Diagnosis Date   Anxiety    Arthritis    Asthma    Bronchitis    hx of    Chronic headaches    [facility]   COVID-19    Cystitis    1954   Depression    Ear pain    bilat    Hiatal hernia    History of blood clots    Lungs, per [place] new patient packet    History of blood transfusion    2010   Hx of blood clots 2007 & 2010   post TKR   Hyperthyroidism    Pelvic fracture (HCC)    hx of in three places secondary to fall    Pneumonia    hx of 1952   Pulmonary embolism ([place]) 1993   on HRT   Right wrist fracture    hx of 1995 has plate secondary to fall    Shaking    hands bilat   Shingles     per [facility] new patient packet    Shortness of  breath dyspnea    Skin cancer    basal cell    Spinal stenosis    Trauma    left lower leg 10/22/2014    Urinary frequency    Urticaria     Patient Active Problem List   Diagnosis Date Noted   Cellulitis of right lower leg 09/21/2021   Allergic rhinitis 10/13/2019   Multiple lung nodules on CT 07/29/2018   Other chest pain 07/28/2018   Prediabetes 05/09/2017   CKD (chronic kidney disease) stage 3, GFR 30-59 ml/min ([place]) 11/08/2016   Bursitis of right knee 08/07/2016   Herpes zoster 06/02/2016   Upper airway cough syndrome 06/01/2016   Hyperlipidemia 03/22/2016   Choking 03/15/2016   GERD (gastroesophageal reflux disease) 03/15/2016   Graves disease 01/04/2016   Osteopenia, severe 10/14/2015   Other constipation 09/01/2015   Incisional hernia 07/08/2015   Ventral hernia without obstruction or gangrene 05/12/2015   Lower back pain 05/12/2015   Spinal stenosis in cervical region 11/26/2013   Cough 03/07/2012   Depression 08/05/2008   Personal  history of venous thrombosis and embolism 08/05/2008   Chronic rhinitis 12/29/2007   Asthma 12/29/2007    Past Surgical History:  Procedure Laterality Date   ABDOMINAL HYSTERECTOMY  1969    per [facility] new patient packet    [place]    per [facility] new patient packet    COLONOSCOPY     diverticulosis   ESI   11/2013    C7-T1 ; Dr Nelva Bush   EYE SURGERY     bilat cataract surgery    INSERTION OF MESH N/A 07/08/2015   Procedure: INSERTION OF MESH;  Surgeon: Armandina Gemma, MD;  Location: [facility] ORS;  Service: General;  Laterality: N/A;   REPLACEMENT TOTAL KNEE BILATERAL  2007 & 2010   SP FACET INJECTION  01/19/14   RC2-3,3-4,C4-5   TONSILLECTOMY  1945    per [facility] new patient packet    VENTRAL HERNIA REPAIR N/A 07/08/2015   Procedure: VENTRAL ADULT REPAIR VENTRAL INCISIONAL HERNIA REPAIR;  Surgeon: Armandina Gemma, MD;  Location: [facility] ORS;  Service: General;  Laterality: N/A;   WRIST  FRACTURE SURGERY Bilateral 10  years and 2 years/   WRIST SURGERY Left 06/2016   plate      OB History   No obstetric history on file.     Family History  Problem Relation Age of Onset   Allergies Brother    Asthma Brother    Clotting disorder Father        PTE X 2   Skin cancer Father    Heart failure Father        Per [place] New Patient Packet    Skin cancer Mother    Pneumonia Mother        1/2 lung loss per [place] New Patient Packet    Sudden death Other        2 M aunts & 2 M uncles in 33s   Heart attack Brother        > 53   Bladder Cancer Brother    Schizophrenia Son        Per [facility] New Patient Packet    Lung disease Son        Per [place] New Patient Packet    Mental illness Son        suicide by hanging   Diabetes Son    Hepatitis C Son    Diabetes Daughter    Migraines Daughter    Heart attack Daughter    Breast cancer Daughter    Stroke Neg Hx     Social History   Tobacco Use   Smoking status: Never   Smokeless tobacco: Never  Vaping Use   Vaping Use: Never used  Substance Use Topics   Alcohol use: Yes    Alcohol/week: 4.0 standard drinks    Types: 4 Glasses of wine per week    Comment: minimal   Drug use: No    Home Medications Prior to Admission medications   Medication Sig Start Date End Date Taking? Authorizing Provider  ascorbic acid (VITAMIN C) 500 MG tablet Take 500 mg by mouth daily.    [provider]  atorvastatin (LIPITOR) 20 MG tablet Take 1 tablet (20 mg total) by mouth daily. 01/05/21   Reed, Tiffany L, DO  cholecalciferol (VITAMIN D) 1000 UNITS tablet Take 1,000 Units by mouth daily.    [provider]  Cyanocobalamin (B-12) 3000 MCG SUBL Place 1  tablet under the tongue daily.    [provider]  denosumab (PROLIA) 60 MG/ML SOSY injection Inject 60 mg into the skin every 6 (six) months.    [provider]  doxycycline (VIBRA-TABS) 100 MG tablet Take 1 tablet (100 mg total) by mouth 2 (two) times daily  for 10 days. 09/21/21 10/01/21  Gerlene Fee, NP  DULoxetine (CYMBALTA) 30 MG capsule Take 30 mg by mouth daily.  03/16/18   [provider]  famotidine (PEPCID) 40 MG tablet TAKE 1 TABLET(40 MG) BY MOUTH AT BEDTIME 12/02/20   Ladene Artist, MD  metoprolol succinate (TOPROL-XL) 25 MG 24 hr tablet TAKE 1 TABLET(25 MG) BY MOUTH DAILY WITH OR IMMEDIATELY FOLLOWING A MEAL 04/24/21   Adrian Prows, MD  mupirocin cream (BACTROBAN) 2 % Apply 1 application topically 2 (two) times daily. 06/26/21   Lannie Fields, PA-C  Fluticasone-Salmeterol (ADVAIR) 100-50 MCG/DOSE AEPB Inhale 1 puff into the lungs 2 (two) times daily.  03/07/12  [provider]    Allergies    Penicillins and Sulfa antibiotics  Review of Systems   Review of Systems  Constitutional:  Negative for activity change, appetite change, chills, fatigue and fever.  HENT:  Negative for ear pain and sore throat.   Eyes:  Negative for pain and visual disturbance.  Respiratory:  Negative for cough and shortness of breath.   Cardiovascular:  Negative for chest pain and palpitations.  Gastrointestinal:  Negative for abdominal pain, diarrhea, nausea and vomiting.  Genitourinary:  Negative for dysuria and hematuria.  Musculoskeletal:  Negative for arthralgias, back pain, gait problem, joint swelling, myalgias and neck pain.  Skin:  Positive for wound. Negative for color change and rash.  Allergic/Immunologic: Negative for immunocompromised state.  Neurological:  Negative for dizziness, seizures, syncope, weakness, light-headedness, numbness and headaches.  Hematological:  Does not bruise/bleed easily.  Psychiatric/Behavioral:  Negative for confusion and decreased concentration.   All other systems reviewed and are negative.  Physical Exam Updated Vital Signs BP (!) 151/76 (BP Location: Right Arm)   Pulse 80   Temp 98 F (36.7 C) (Oral)   Resp 16   Ht 5\' 4"  (1.626 m)   Wt 68 kg   SpO2 100%   BMI 25.75 kg/m   Physical  Exam Vitals and nursing note reviewed.  Constitutional:      General: She is not in acute distress.    Appearance: Normal appearance. She is well-developed and normal weight. She is not ill-appearing, toxic-appearing or diaphoretic.  HENT:     Head: Normocephalic and atraumatic.     Right Ear: External ear normal.     Left Ear: External ear normal.     Nose: Nose normal.  Eyes:     Conjunctiva/sclera: Conjunctivae normal.  Cardiovascular:     Rate and Rhythm: Normal rate and regular rhythm.     Heart sounds: No murmur heard. Pulmonary:     Effort: Pulmonary effort is normal. No respiratory distress.     Breath sounds: Normal breath sounds. No wheezing.  Chest:     Chest wall: No tenderness.  Abdominal:     Palpations: Abdomen is soft.     Tenderness: There is no abdominal tenderness.  Musculoskeletal:     Cervical back: Normal range of motion and neck supple.  Skin:    General: Skin is warm and dry.     Coloration: Skin is not jaundiced or pale.     Comments: Wound to anterior aspect of right  distal lower extremity.  Surrounding erythema is present.  Surrounding tenderness extends from mid tibia to ankle.  No crepitus or pain out of proportion.  No current drainage.  Central area of wound shows granulation tissue and scab.  Neurological:     General: No focal deficit present.     Mental Status: She is alert and oriented to person, place, and time.     Cranial Nerves: No cranial nerve deficit.     Sensory: No sensory deficit.     Motor: No weakness.     Coordination: Coordination normal.  Psychiatric:        Mood and Affect: Mood normal.        Behavior: Behavior normal.        Thought Content: Thought content normal.        Judgment: Judgment normal.    ED Results / Procedures / Treatments   Labs (all labs ordered are listed, but only abnormal results are displayed) Labs Reviewed  COMPREHENSIVE METABOLIC PANEL - Abnormal; Notable for the following components:       Result Value   Glucose, Bld 105 (*)    BUN 28 (*)    Creatinine, Ser 1.04 (*)    GFR, Estimated 52 (*)    All other components within normal limits  CBC WITH DIFFERENTIAL/PLATELET  SEDIMENTATION RATE  C-REACTIVE PROTEIN    EKG None  Radiology DG Tibia/Fibula Right  Result Date: 09/27/2021 CLINICAL DATA:  Nonhealing leg wound EXAM: RIGHT TIBIA AND FIBULA - 2 VIEW COMPARISON:  Knee radiographs from 07/26/2016 FINDINGS: Total knee prosthesis. No fracture or acute bony findings. No gas tracking in the soft tissues. No obvious foreign body. Small calcifications anterior to the distal patellar tendon similar to the prior exam. Questionable cutaneous irregularity anterior to the junction of the mid and distal thirds of the tibia. IMPRESSION: 1. No fracture, foreign body, or gas tracking in the soft tissues identified. 2. Questionable cutaneous irregularity anterior to the junction of the mid and distal thirds of the tibia. 3. Total knee prosthesis. Electronically Signed   By: Van Clines M.D.   On: 09/27/2021 13:54   CT TIBIA FIBULA RIGHT W CONTRAST  Result Date: 09/27/2021 CLINICAL DATA:  Recent trauma. Patient fell 09/15/2021. Persistent wound involving the anterior aspect of the right lower extremity. EXAM: CT OF THE LOWER RIGHT EXTREMITY WITH CONTRAST TECHNIQUE: Multidetector CT imaging of the lower right extremity was performed according to the standard protocol following intravenous contrast administration. CONTRAST:  97mL OMNIPAQUE IOHEXOL 350 MG/ML SOLN COMPARISON:  Radiographs, same date. FINDINGS: The bony structures are intact. No fracture of the tibia or fibula is identified. Age related advanced osteoporosis is noted. The right knee prosthesis appears intact. No complicating features are identified. The ankle joint is maintained. No ankle joint effusion. There is a wound involving the anterior aspect of the right lower extremity near the junction of the middle and distal thirds of  the tibia. There is mild skin thickening and irregularity but I do not see any definite rim enhancing abscess. Mild changes of cellulitis involving the right lower extremity. Possible mild myositis. No findings to suggest septic arthritis or osteomyelitis or pyomyositis. No radiopaque foreign body is identified. Please note however that wood splinters can be extremely difficult to see on imaging studies. If there is strong clinical suspicion focused ultrasound examination may be the best modality to evaluate for possible wood splinters. IMPRESSION: 1. Open wound involving the anterior aspect of the right lower extremity near  the junction of the middle and distal thirds of the tibia. There is mild skin thickening and irregularity but no definite rim enhancing abscess. 2. No definite radiopaque foreign body. Please see above discussion. 3. Mild changes of cellulitis involving the right lower extremity. Possible mild myositis. No findings to suggest septic arthritis or osteomyelitis or pyomyositis. 4. Intact right knee prosthesis. 5. Age related advanced osteoporosis. Electronically Signed   By: Marijo Sanes M.D.   On: 09/27/2021 15:43    Procedures Procedures   Medications Ordered in ED Medications  iohexol (OMNIPAQUE) 350 MG/ML injection 100 mL (85 mLs Intravenous Contrast Given 09/27/21 1404)  dalbavancin (DALVANCE) 1,500 mg in dextrose 5 % 500 mL IVPB (0 mg Intravenous Stopped 09/27/21 1744)    ED Course  I have reviewed the triage vital signs and the nursing notes.  Pertinent labs & imaging results that were available during my care of the patient were reviewed by me and considered in my medical decision making (see chart for details).  Clinical Course as of 09/28/21 1640  Wed Sep 27, 2021  1623 Pending dalvance infusion then dc [MK]    Clinical Course User Index [MK] Kommor, Madison, MD   MDM Rules/Calculators/A&P                           Patient presents for evidence of worsening skin  infection despite outpatient antibiotics (doxycycline).  On arrival, vital signs are notable for hypertension only.  Patient is afebrile.  She denies any recent history of systemic symptoms.  Granulation tissue was present at site of wound.  There are no areas of fluctuance.  There is surrounding erythema and tenderness, consistent with cellulitis.  She does state that these areas have increased in size despite her taking doxycycline as prescribed.  Given concern for worsening infection, labs, including inflammatory markers were ordered.  X-ray imaging showed no evidence of bone involvement or foreign bodies.  To further rule this out, CT of distal lower extremity was ordered.  CT scan showed cellulitis with possible mild myositis without evidence of abscess, osteomyelitis, or pyomyositis.  Patient's lab work is reassuring without a leukocytosis or elevation in inflammatory markers.  I spoke with the patient about management options. She does have a history of allergic reactions to penicillin and sulfa antibiotics. Given that she has had evidence of worsening cellulitis despite antibiotics, patient would be a candidate for admission for IV antibiotics.  Because she is otherwise well and denies any symptoms other than pain to the area of the wound, I feel the patient is a good candidate for Dalvance infusion.  Patient was very much in favor of this.  This medication required courier transport to [facility].  Pharmacist on-call was informed of this order.  Patient was advised to discontinue the use of her doxycycline and to follow-up with her primary care doctor.  At time of signout, patient was awaiting Dalvance infusion prior to discharge.  Care of patient was signed out to oncoming ED provider.  Final Clinical Impression(s) / ED Diagnoses Final diagnoses:  Visit for wound check  Cellulitis of lower extremity, unspecified laterality  Wound of right lower extremity, initial encounter    Rx / DC  Orders ED Discharge Orders          Ordered    Ambulatory referral to Infectious Disease       Comments: Cellulitis patient:  Received dalbavancin on 09/27/2021.   09/27/21 1447  Godfrey Pick, MD 09/28/21 [phone]

## 2021-09-27 NOTE — Discharge Instructions (Addendum)
You are seen the emergency department for evaluation of a skin infection over the leg.  The skin infection appears to have failed oral antibiotics and you received a dose of IV antibiotics here in the emergency department.  At this time you are safe for discharge, but if this cellulitis worsens, please return to the emergency department immediately for an extended course of IV antibiotics.  Return to the emergency department if you experience new or worsening leg pain, numbness, tingling of the leg, weakness of the leg, vomiting, fevers, chills or any other concerning symptom.

## 2021-10-02 DIAGNOSIS — R0989 Other specified symptoms and signs involving the circulatory and respiratory systems: Secondary | ICD-10-CM | POA: Diagnosis not present

## 2021-10-02 DIAGNOSIS — R1313 Dysphagia, pharyngeal phase: Secondary | ICD-10-CM | POA: Diagnosis not present

## 2021-10-03 ENCOUNTER — Encounter (HOSPITAL_BASED_OUTPATIENT_CLINIC_OR_DEPARTMENT_OTHER): Payer: Self-pay | Admitting: Emergency Medicine

## 2021-10-03 ENCOUNTER — Emergency Department (HOSPITAL_BASED_OUTPATIENT_CLINIC_OR_DEPARTMENT_OTHER)
Admission: EM | Admit: 2021-10-03 | Discharge: 2021-10-03 | Disposition: A | Discharge status: Home / Self Care | Payer: Medicare Other | Attending: Emergency Medicine | Admitting: Emergency Medicine

## 2021-10-03 ENCOUNTER — Other Ambulatory Visit: Payer: Self-pay

## 2021-10-03 DIAGNOSIS — W228XXD Striking against or struck by other objects, subsequent encounter: Secondary | ICD-10-CM | POA: Insufficient documentation

## 2021-10-03 DIAGNOSIS — J45909 Unspecified asthma, uncomplicated: Secondary | ICD-10-CM | POA: Diagnosis not present

## 2021-10-03 DIAGNOSIS — Z8616 Personal history of COVID-19: Secondary | ICD-10-CM | POA: Diagnosis not present

## 2021-10-03 DIAGNOSIS — Z79899 Other long term (current) drug therapy: Secondary | ICD-10-CM | POA: Insufficient documentation

## 2021-10-03 DIAGNOSIS — S81801A Unspecified open wound, right lower leg, initial encounter: Secondary | ICD-10-CM | POA: Diagnosis not present

## 2021-10-03 DIAGNOSIS — S81801D Unspecified open wound, right lower leg, subsequent encounter: Secondary | ICD-10-CM | POA: Diagnosis not present

## 2021-10-03 DIAGNOSIS — N183 Chronic kidney disease, stage 3 unspecified: Secondary | ICD-10-CM | POA: Insufficient documentation

## 2021-10-03 LAB — CBC WITH DIFFERENTIAL/PLATELET
Abs Immature Granulocytes: 0.02 10*3/uL (ref 0.00–0.07)
Basophils Absolute: 0 10*3/uL (ref 0.0–0.1)
Basophils Relative: 0 %
Eosinophils Absolute: 0.1 10*3/uL (ref 0.0–0.5)
Eosinophils Relative: 2 %
HCT: 39.1 % (ref 36.0–46.0)
Hemoglobin: 12.8 g/dL (ref 12.0–15.0)
Immature Granulocytes: 0 %
Lymphocytes Relative: 24 %
Lymphs Abs: 1.6 10*3/uL (ref 0.7–4.0)
MCH: 32.2 pg (ref 26.0–34.0)
MCHC: 32.7 g/dL (ref 30.0–36.0)
MCV: 98.5 fL (ref 80.0–100.0)
Monocytes Absolute: 0.7 10*3/uL (ref 0.1–1.0)
Monocytes Relative: 11 %
Neutro Abs: 4.3 10*3/uL (ref 1.7–7.7)
Neutrophils Relative %: 63 %
Platelets: 265 10*3/uL (ref 150–400)
RBC: 3.97 MIL/uL (ref 3.87–5.11)
RDW: 12.9 % (ref 11.5–15.5)
WBC: 6.8 10*3/uL (ref 4.0–10.5)
nRBC: 0 % (ref 0.0–0.2)

## 2021-10-03 LAB — BASIC METABOLIC PANEL
Anion gap: 6 (ref 5–15)
BUN: 29 mg/dL — ABNORMAL HIGH (ref 8–23)
CO2: 25 mmol/L (ref 22–32)
Calcium: 9 mg/dL (ref 8.9–10.3)
Chloride: 105 mmol/L (ref 98–111)
Creatinine, Ser: 1.13 mg/dL — ABNORMAL HIGH (ref 0.44–1.00)
GFR, Estimated: 47 mL/min — ABNORMAL LOW (ref >60)
Glucose, Bld: 120 mg/dL — ABNORMAL HIGH (ref 70–99)
Potassium: 4.8 mmol/L (ref 3.5–5.1)
Sodium: 136 mmol/L (ref 135–145)

## 2021-10-03 NOTE — ED Notes (Signed)
Pt was seen here on 9/28 for a 1x dose of dalbavancin, pt was suppose to follow-up with ID but has not.  C/O intense RLE pain.  Area is scabbed over, no drainage noted, slight redness noted surrounding wound

## 2021-10-03 NOTE — Discharge Instructions (Addendum)
Your wound appears to be healing. Please use the mupirocin cream that you were given on the wound for added healing benefits. Continue to take tylenol as needed for pain management. Also, it is extremely important that you find a primary care provider to manage these symptoms as well as your overall health. Return if symptoms worsen

## 2021-10-03 NOTE — ED Triage Notes (Signed)
Pt reports was seen for same 5 days ago and was told to return if no improvement. Pt able to ambulate but reports intense pain. Moderate redness noted to RLE. Pt reports has taken oral abx and IV abx with no relief.

## 2021-10-03 NOTE — ED Provider Notes (Signed)
[place] EMERGENCY DEPARTMENT Provider Note   CSN: [id number] Arrival date & time: 10/03/21  1008     History Chief Complaint  Patient presents with   Leg Pain    Faith Edwards is a 87 y.o. female.  Patient presents today with right lower leg wound.  She states that this occurred 17 days ago when she was gardening.  She hit her leg on a wooden planter.  Was seen a few days later and was diagnosed with cellulitis and given doxycycline.  No improvement on oral antibiotics, which prompted subsequent emergency room visit 5 days ago where she was given a one time IV infusion of Dalvance.  She also underwent x-ray and CT scan of the effected extremity which revealed no signs of osteomyelitis or retained foreign body.  She was told to return if her wound did not improve.  She says that the wound looks the same today and she is still having pain.  The wound is no longer draining, and she has not been bandaging it.  She is ambulatory with some pain, sensation is intact.  She has been taking Tylenol for pain management with success. Denies fevers, chills, nausea, vomiting.  The history is provided by the patient. No language interpreter was used.  Leg Pain Associated symptoms: no fatigue and no fever       Past Medical History:  Diagnosis Date   Anxiety    Arthritis    Asthma    Bronchitis    hx of    Chronic headaches    [facility]   COVID-19    Cystitis    1954   Depression    Ear pain    bilat    Hiatal hernia    History of blood clots    Lungs, per [place] new patient packet    History of blood transfusion    2010   Hx of blood clots 2007 & 2010   post TKR   Hyperthyroidism    Pelvic fracture (HCC)    hx of in three places secondary to fall    Pneumonia    hx of 1952   Pulmonary embolism ([place]) 1993   on HRT   Right wrist fracture    hx of 1995 has plate secondary to fall    Shaking    hands bilat   Shingles     per [facility] new patient packet    Shortness  of breath dyspnea    Skin cancer    basal cell    Spinal stenosis    Trauma    left lower leg 10/22/2014    Urinary frequency    Urticaria     Patient Active Problem List   Diagnosis Date Noted   Cellulitis of right lower leg 09/21/2021   Allergic rhinitis 10/13/2019   Multiple lung nodules on CT 07/29/2018   Other chest pain 07/28/2018   Prediabetes 05/09/2017   CKD (chronic kidney disease) stage 3, GFR 30-59 ml/min ([place]) 11/08/2016   Bursitis of right knee 08/07/2016   Herpes zoster 06/02/2016   Upper airway cough syndrome 06/01/2016   Hyperlipidemia 03/22/2016   Choking 03/15/2016   GERD (gastroesophageal reflux disease) 03/15/2016   Graves disease 01/04/2016   Osteopenia, severe 10/14/2015   Other constipation 09/01/2015   Incisional hernia 07/08/2015   Ventral hernia without obstruction or gangrene 05/12/2015   Lower back pain 05/12/2015   Spinal stenosis in cervical region 11/26/2013   Cough 03/07/2012   Depression  08/05/2008   Personal history of venous thrombosis and embolism 08/05/2008   Chronic rhinitis 12/29/2007   Asthma 12/29/2007    Past Surgical History:  Procedure Laterality Date   ABDOMINAL HYSTERECTOMY  1969    per [facility] new patient packet    [place]    per [facility] new patient packet    COLONOSCOPY     diverticulosis   ESI   11/2013    C7-T1 ; Dr Nelva Bush   EYE SURGERY     bilat cataract surgery    INSERTION OF MESH N/A 07/08/2015   Procedure: INSERTION OF MESH;  Surgeon: Armandina Gemma, MD;  Location: [facility];  Service: General;  Laterality: N/A;   REPLACEMENT TOTAL KNEE BILATERAL  2007 & 2010   SP FACET INJECTION  01/19/14   RC2-3,3-4,C4-5   TONSILLECTOMY  1945    per [facility] new patient packet    VENTRAL HERNIA REPAIR N/A 07/08/2015   Procedure: VENTRAL ADULT REPAIR VENTRAL INCISIONAL HERNIA REPAIR;  Surgeon: Armandina Gemma, MD;  Location: [facility];  Service: General;  Laterality: N/A;   WRIST  FRACTURE SURGERY Bilateral 10  years and 2 years/   WRIST SURGERY Left 06/2016   plate      OB History   No obstetric history on file.     Family History  Problem Relation Age of Onset   Allergies Brother    Asthma Brother    Clotting disorder Father        PTE X 2   Skin cancer Father    Heart failure Father        Per [place] New Patient Packet    Skin cancer Mother    Pneumonia Mother        1/2 lung loss per [place] New Patient Packet    Sudden death Other        2 M aunts & 2 M uncles in 69s   Heart attack Brother        > 16   Bladder Cancer Brother    Schizophrenia Son        Per [facility] New Patient Packet    Lung disease Son        Per [place] New Patient Packet    Mental illness Son        suicide by hanging   Diabetes Son    Hepatitis C Son    Diabetes Daughter    Migraines Daughter    Heart attack Daughter    Breast cancer Daughter    Stroke Neg Hx     Social History   Tobacco Use   Smoking status: Never   Smokeless tobacco: Never  Vaping Use   Vaping Use: Never used  Substance Use Topics   Alcohol use: Yes    Alcohol/week: 4.0 standard drinks    Types: 4 Glasses of wine per week    Comment: minimal   Drug use: No    Home Medications Prior to Admission medications   Medication Sig Start Date End Date Taking? Authorizing Provider  ascorbic acid (VITAMIN C) 500 MG tablet Take 500 mg by mouth daily.   Yes [provider]  atorvastatin (LIPITOR) 20 MG tablet Take 1 tablet (20 mg total) by mouth daily. 01/05/21  Yes Reed, Tiffany L, DO  cholecalciferol (VITAMIN D) 1000 UNITS tablet Take 1,000 Units by mouth daily.   Yes [provider]  Cyanocobalamin (B-12) 3000  MCG SUBL Place 1 tablet under the tongue daily.   Yes [provider]  DULoxetine (CYMBALTA) 30 MG capsule Take 30 mg by mouth daily.  03/16/18  Yes [provider]  famotidine (PEPCID) 40 MG tablet TAKE 1 TABLET(40 MG) BY MOUTH AT BEDTIME 12/02/20  Yes Ladene Artist,  MD  denosumab (PROLIA) 60 MG/ML SOSY injection Inject 60 mg into the skin every 6 (six) months.    [provider]  metoprolol succinate (TOPROL-XL) 25 MG 24 hr tablet TAKE 1 TABLET(25 MG) BY MOUTH DAILY WITH OR IMMEDIATELY FOLLOWING A MEAL 04/24/21   Adrian Prows, MD  mupirocin cream (BACTROBAN) 2 % Apply 1 application topically 2 (two) times daily. 06/26/21   Lannie Fields, PA-C  Fluticasone-Salmeterol (ADVAIR) 100-50 MCG/DOSE AEPB Inhale 1 puff into the lungs 2 (two) times daily.  03/07/12  [provider]    Allergies    Penicillins and Sulfa antibiotics  Review of Systems   Review of Systems  Constitutional:  Negative for activity change, chills, fatigue and fever.  Respiratory:  Negative for cough and shortness of breath.   Cardiovascular:  Negative for chest pain and leg swelling.  Gastrointestinal:  Negative for abdominal distention, abdominal pain, diarrhea, nausea and vomiting.  Musculoskeletal:  Negative for gait problem and joint swelling.  Skin:  Positive for wound.  Neurological:  Negative for dizziness, tremors, seizures, syncope, facial asymmetry, speech difficulty, weakness, light-headedness, numbness and headaches.  Psychiatric/Behavioral:  Negative for confusion and decreased concentration.   All other systems reviewed and are negative.  Physical Exam Updated Vital Signs BP (!) 152/67 (BP Location: Right Arm)   Pulse 91   Temp 97.8 F (36.6 C) (Oral)   Resp 18   Ht 5\' 4"  (1.626 m)   Wt 68 kg   SpO2 97%   BMI 25.75 kg/m   Physical Exam Vitals and nursing note reviewed.  Constitutional:      General: She is not in acute distress.    Appearance: Normal appearance. She is normal weight. She is not ill-appearing, toxic-appearing or diaphoretic.  HENT:     Head: Normocephalic and atraumatic.  Cardiovascular:     Rate and Rhythm: Normal rate.  Pulmonary:     Effort: Pulmonary effort is normal. No respiratory distress.  Musculoskeletal:         General: Normal range of motion.     Cervical back: Normal range of motion.     Comments: Wound to anterior aspect of right distal lower extremity.  Erythema surrounding immediate area around the wound is present.  Mild tenderness noted to area surrounding wound, however is distractible.  No crepitus or pain out of proportion.  Some serous drainage expressed.  Area of wound shows granulation tissue and scab. Distal sensation and pulses intact.  Feet:     Comments: Normal sensation and range of motion to left foot and ankle. Skin:    General: Skin is warm and dry.  Neurological:     General: No focal deficit present.     Mental Status: She is alert.  Psychiatric:        Mood and Affect: Mood normal.        Behavior: Behavior normal.    ED Results / Procedures / Treatments   Labs (all labs ordered are listed, but only abnormal results are displayed) Labs Reviewed  BASIC METABOLIC PANEL - Abnormal; Notable for the following components:      Result Value   Glucose, Bld 120 (*)  BUN 29 (*)    Creatinine, Ser 1.13 (*)    GFR, Estimated 47 (*)    All other components within normal limits  CBC WITH DIFFERENTIAL/PLATELET    EKG None  Radiology No results found.  Procedures Procedures   Medications Ordered in ED Medications - No data to display  ED Course  I have reviewed the triage vital signs and the nursing notes.  Pertinent labs & imaging results that were available during my care of the patient were reviewed by me and considered in my medical decision making (see chart for details).    MDM Rules/Calculators/A&P                         Patient presents to the ED with complaints of wound check. Nontoxic, vitals unremarkable.    Additional history obtained:  Additional history obtained from chart review & nursing note review.    ED Course:  Patient presents today for evaluation of wound. She received Dalvance for cellulitis 5 days ago, states that wound hasnt  improved since. Wound expresses some serous fluid, however no purulence or fluctuance noted. Entire lesion has scabbed over, and surrounding area is mildly erythematous, however suspect this is mostly healthy granulation tissue. Patient endorses 5/10 pain to the area on ambulation, but no pain out of proportion and no pain expressed on palpation of area. I believe that the wound is no longer infected and is healing nicely. I have low suspicion for osteomyelitis or cellulitis. Patient is not diabetic, is not high risk for impaired wound healing.  She is afebrile, nontoxic-appearing, and in no acute distress.  She has full range of motion of the affected extremity.  I do not feel additional labs or imaging is indicated at this time.  Patient was prescribed mupirocin ointment for a previous injury which she still has, will recommend using remaining ointment on this wound and bandaging for a few days for additional management of wound as well as Tylenol as needed for pain.  Patient is amenable with plans of discharge, will recommend primary care follow-up for further evaluation.  Patient educated on red flag symptoms that would prompt immediate return.  Portions of this note were generated with [employer]. Dictation errors may occur despite best attempts at proofreading.    This is a shared visit with supervising physician Dr. Tamera Punt who has independently evaluated patient & provided guidance in evaluation/management/disposition, in agreement with care     Final Clinical Impression(s) / ED Diagnoses Final diagnoses:  Leg wound, right, subsequent encounter    Rx / DC Orders ED Discharge Orders     None     An After Visit Summary was printed and given to the patient.    Nestor Lewandowsky 10/03/21 2029    Malvin Johns, MD 10/03/21 2213

## 2021-10-06 ENCOUNTER — Encounter: Payer: Self-pay | Admitting: Family

## 2021-10-06 ENCOUNTER — Ambulatory Visit (INDEPENDENT_AMBULATORY_CARE_PROVIDER_SITE_OTHER): Payer: Medicare Other | Admitting: Family

## 2021-10-06 ENCOUNTER — Other Ambulatory Visit: Payer: Self-pay

## 2021-10-06 VITALS — BP 118/78 | HR 100 | Temp 97.7°F | Resp 16 | Ht 64.0 in | Wt 162.8 lb

## 2021-10-06 DIAGNOSIS — L03115 Cellulitis of right lower limb: Secondary | ICD-10-CM

## 2021-10-06 DIAGNOSIS — I209 Angina pectoris, unspecified: Secondary | ICD-10-CM | POA: Diagnosis not present

## 2021-10-06 MED ORDER — DOXYCYCLINE HYCLATE 100 MG PO TABS: 100.0000 mg | ORAL_TABLET | Freq: Two times a day (BID) | ORAL | 0 refills | Status: AC

## 2021-10-06 NOTE — Patient Instructions (Signed)
-   Keep right lower leg wound clean and dry.Notify provider for any signs of infection.

## 2021-10-06 NOTE — Progress Notes (Signed)
Provider: Sharolyn Weber FNP-C  Lauree Chandler, NP  Patient Care Team: Lauree Chandler, NP as PCP - General (Geriatric Medicine) Tanda Rockers, MD as Consulting Physician (Pulmonary Disease) Marica Otter, [place] (Optometry) Norma Fredrickson, MD as Consulting Physician (Psychiatry) Philemon Kingdom, MD as Consulting Physician (Internal Medicine) Leatha Gilding, MD as Referring Physician (Orthopedic Surgery) Harold Hedge, Darrick Grinder, MD as Consulting Physician (Allergy and Immunology) Ladene Artist, MD as Consulting Physician (Gastroenterology) Pedro Earls, MD as Attending Physician (Family Medicine) Melida Quitter, MD as Consulting Physician (Otolaryngology)  Extended Emergency Contact Information Primary Emergency Contact: Tully,Carl Address: [address]          [place], [place] [address] [name] of [place] Phone: [phone] Relation: Spouse Secondary Emergency Contact: [place], [place] [address] [name] of [place] Phone: [phone] Relation: Daughter  Code Status:  Full Code  Goals of care: Advanced Directive information Advanced Directives 10/06/2021  Does Patient Have a Medical Advance Directive? No  Does patient want to make changes to medical advance directive? -  Would patient like information on creating a medical advance directive? No - Patient declined     Chief Complaint  Patient presents with   Acute Visit    Patient complains of right leg wound.    HPI:  Pt is a 87 y.o. female seen today for an acute visit for evaluation of right leg wound x 3 weeks.states fell three weeks ago.she went to ED twice was prescribed antibiotics.has completed antibiotics but still has redness around wound.Has been applying bactrim topical antibiotic ointment and leaving open to air. Denies any fever or chills. Has had no drainage.     Past Medical History:  Diagnosis Date   Anxiety    Arthritis    Asthma    Bronchitis     hx of    Chronic headaches    [facility]   COVID-19    Cystitis    1954   Depression    Ear pain    bilat    Hiatal hernia    History of blood clots    Lungs, per [place] new patient packet    History of blood transfusion    2010   Hx of blood clots 2007 & 2010   post TKR   Hyperthyroidism    Pelvic fracture (HCC)    hx of in three places secondary to fall    Pneumonia    hx of 1952   Pulmonary embolism ([place]) 1993   on HRT   Right wrist fracture    hx of 1995 has plate secondary to fall    Shaking    hands bilat   Shingles     per [place] new patient packet    Shortness of breath dyspnea    Skin cancer    basal cell    Spinal stenosis    Trauma    left lower leg 10/22/2014    Urinary frequency    Urticaria    Past Surgical History:  Procedure Laterality Date   ABDOMINAL HYSTERECTOMY  1969    per [facility] new patient packet    [place]    per [facility] new patient packet    COLONOSCOPY     diverticulosis   ESI   11/2013    C7-T1 ; Dr Nelva Bush   EYE SURGERY  bilat cataract surgery    INSERTION OF MESH N/A 07/08/2015   Procedure: INSERTION OF MESH;  Surgeon: Armandina Gemma, MD;  Location: [facility];  Service: General;  Laterality: N/A;   REPLACEMENT TOTAL KNEE BILATERAL  2007 & 2010   SP FACET INJECTION  01/19/14   RC2-3,3-4,C4-5   TONSILLECTOMY  1945    per [facility] new patient packet    VENTRAL HERNIA REPAIR N/A 07/08/2015   Procedure: VENTRAL ADULT REPAIR VENTRAL INCISIONAL HERNIA REPAIR;  Surgeon: Armandina Gemma, MD;  [facility];  Service: General;  Laterality: N/A;   WRIST FRACTURE SURGERY Bilateral 10  years and 2 years/   WRIST SURGERY Left 06/2016   plate     Allergies  Allergen Reactions   Penicillins Hives   Sulfa Antibiotics Hives and Nausea And Vomiting    7-15    Outpatient Encounter Medications as of 10/06/2021  Medication Sig   ascorbic acid (VITAMIN C) 500 MG tablet Take 500 mg by mouth  daily.   atorvastatin (LIPITOR) 20 MG tablet Take 1 tablet (20 mg total) by mouth daily.   cholecalciferol (VITAMIN D) 1000 UNITS tablet Take 1,000 Units by mouth daily.   Cyanocobalamin (B-12) 3000 MCG SUBL Place 1 tablet under the tongue daily.   denosumab (PROLIA) 60 MG/ML SOSY injection Inject 60 mg into the skin every 6 (six) months.   DULoxetine (CYMBALTA) 30 MG capsule Take 30 mg by mouth daily.    famotidine (PEPCID) 40 MG tablet TAKE 1 TABLET(40 MG) BY MOUTH AT BEDTIME   metoprolol succinate (TOPROL-XL) 25 MG 24 hr tablet TAKE 1 TABLET(25 MG) BY MOUTH DAILY WITH OR IMMEDIATELY FOLLOWING A MEAL   mupirocin cream (BACTROBAN) 2 % Apply 1 application topically 2 (two) times daily.   [DISCONTINUED] Fluticasone-Salmeterol (ADVAIR) 100-50 MCG/DOSE AEPB Inhale 1 puff into the lungs 2 (two) times daily.   No facility-administered encounter medications on file as of 10/06/2021.    Review of Systems  Constitutional:  Negative for appetite change, chills, fatigue, fever and unexpected weight change.  HENT:  Positive for hearing loss. Negative for congestion, nosebleeds, postnasal drip, rhinorrhea, sinus pressure, sinus pain, sneezing and sore throat.   Eyes:  Negative for pain, discharge, redness, itching and visual disturbance.  Respiratory:  Negative for cough, chest tightness, shortness of breath and wheezing.   Cardiovascular:  Negative for chest pain, palpitations and leg swelling.  Gastrointestinal:  Negative for abdominal distention, abdominal pain, blood in stool, constipation, diarrhea, nausea and vomiting.  Musculoskeletal:  Negative for arthralgias, back pain, gait problem, joint swelling, myalgias, neck pain and neck stiffness.  Skin:  Positive for wound. Negative for color change, pallor and rash.       Right lower leg   Neurological:  Negative for dizziness, weakness, light-headedness, numbness and headaches.   Immunization History  Administered Date(s) Administered   Fluad  Quad(high Dose 65+) 09/24/2019, 10/24/2020, 09/21/2021   Influenza Whole 10/28/2008, 09/28/2009, 10/08/2011   Influenza, Seasonal, Injecte, Preservative Fre 11/03/2013   Influenza,inj,Quad PF,6+ Mos 10/05/2014, 09/30/2015   Influenza-Unspecified 09/25/2016, 09/09/2017, 07/31/2018   PFIZER(Purple Top)SARS-COV-2 Vaccination 01/01/2020, 01/30/2020, 12/11/2020   Pneumococcal Conjugate-13 09/30/2015   Pneumococcal Polysaccharide-23 01/07/2014   Tdap 10/10/2014, 07/01/2021   Zoster Recombinat (Shingrix) 07/05/2018, 09/09/2018   Pertinent  Health Maintenance Due  Topic Date Due   DEXA SCAN  10/13/2021   INFLUENZA VACCINE  Completed   Fall Risk  10/06/2021 09/21/2021 09/18/2021 07/14/2021 01/05/2021  Falls in the past year? 1 0 1 0 0  Number falls in past yr: 0 0 1 0 0  Injury with Fall? 1 0 1 0 0  Comment - - - - -  Risk Factor Category  - - - - -  Risk for fall due to : History of fall(s) No Fall Risks History of fall(s) No Fall Risks -  Risk for fall due to: Comment - - - - -  Follow up Falls evaluation completed Falls evaluation completed Falls evaluation completed Falls evaluation completed -   Functional Status Survey:    Vitals:   10/06/21 1307  BP: 118/78  Pulse: 100  Resp: 16  Temp: 97.7 F (36.5 C)  SpO2: 95%  Weight: 162 lb 12.8 oz (73.8 kg)  Height: 5\' 4"  (1.626 m)   Body mass index is 27.94 kg/m. Physical Exam Vitals reviewed.  Constitutional:      General: She is not in acute distress.    Appearance: Normal appearance. She is overweight. She is not ill-appearing or diaphoretic.  HENT:     Head: Normocephalic.  Cardiovascular:     Rate and Rhythm: Normal rate and regular rhythm.     Pulses: Normal pulses.     Heart sounds: Normal heart sounds. No murmur heard.   No friction rub. No gallop.  Pulmonary:     Effort: Pulmonary effort is normal. No respiratory distress.     Breath sounds: Normal breath sounds. No wheezing, rhonchi or rales.  Chest:     Chest wall:  No tenderness.  Musculoskeletal:        General: No swelling or tenderness. Normal range of motion.     Right lower leg: No edema.     Left lower leg: No edema.  Skin:    General: Skin is warm and dry.     Coloration: Skin is not pale.     Findings: No bruising, lesion or rash.     Comments: Right leg 7 cm x 5 cm with 1 cm erythema surround the wound.Wound bed covered with scab.Non-tender to touch. No drainage noted.   Neurological:     Mental Status: She is alert and oriented to person, place, and time.     Cranial Nerves: No cranial nerve deficit.     Sensory: No sensory deficit.     Motor: No weakness.     Coordination: Coordination normal.     Gait: Gait normal.  Psychiatric:        Mood and Affect: Mood normal.        Speech: Speech normal.        Behavior: Behavior normal.        Thought Content: Thought content normal.        Judgment: Judgment normal.    Labs reviewed: Recent Labs    10/20/20 0816 09/27/21 1244 10/03/21 1119  NA 140 136 136  K 5.1 4.9 4.8  CL 105 104 105  CO2 26 25 25   GLUCOSE 107* 105* 120*  BUN 25 28* 29*  CREATININE 1.02* 1.04* 1.13*  CALCIUM 9.7 9.3 9.0   Recent Labs    10/20/20 0816 09/27/21 1244  AST 18 27  ALT 14 25  ALKPHOS  --  61  BILITOT 1.1 0.9  PROT 6.7 7.0  ALBUMIN  --  4.1   Recent Labs    10/20/20 0816 09/27/21 1244 10/03/21 1119  WBC 6.4 7.4 6.8  NEUTROABS 3,194 4.8 4.3  HGB 13.4 12.5 12.8  HCT 40.6 38.3 39.1  MCV 96.0 97.0 98.5  PLT  267 275 265   Lab Results  Component Value Date   TSH 5.86 (H) 01/30/2021   Lab Results  Component Value Date   HGBA1C 5.6 01/05/2021   Lab Results  Component Value Date   CHOL 177 10/20/2020   HDL 76 10/20/2020   LDLCALC 85 10/20/2020   TRIG 70 10/20/2020   CHOLHDL 2.3 10/20/2020    Significant Diagnostic Results in last 30 days:  DG Tibia/Fibula Right  Result Date: 09/27/2021 CLINICAL DATA:  Nonhealing leg wound EXAM: RIGHT TIBIA AND FIBULA - 2 VIEW COMPARISON:   Knee radiographs from 07/26/2016 FINDINGS: Total knee prosthesis. No fracture or acute bony findings. No gas tracking in the soft tissues. No obvious foreign body. Small calcifications anterior to the distal patellar tendon similar to the prior exam. Questionable cutaneous irregularity anterior to the junction of the mid and distal thirds of the tibia. IMPRESSION: 1. No fracture, foreign body, or gas tracking in the soft tissues identified. 2. Questionable cutaneous irregularity anterior to the junction of the mid and distal thirds of the tibia. 3. Total knee prosthesis. Electronically Signed   By: Van Clines M.D.   On: 09/27/2021 13:54   CT TIBIA FIBULA RIGHT W CONTRAST  Result Date: 09/27/2021 CLINICAL DATA:  Recent trauma. Patient fell 09/15/2021. Persistent wound involving the anterior aspect of the right lower extremity. EXAM: CT OF THE LOWER RIGHT EXTREMITY WITH CONTRAST TECHNIQUE: Multidetector CT imaging of the lower right extremity was performed according to the standard protocol following intravenous contrast administration. CONTRAST:  68mL OMNIPAQUE IOHEXOL 350 MG/ML SOLN COMPARISON:  Radiographs, same date. FINDINGS: The bony structures are intact. No fracture of the tibia or fibula is identified. Age related advanced osteoporosis is noted. The right knee prosthesis appears intact. No complicating features are identified. The ankle joint is maintained. No ankle joint effusion. There is a wound involving the anterior aspect of the right lower extremity near the junction of the middle and distal thirds of the tibia. There is mild skin thickening and irregularity but I do not see any definite rim enhancing abscess. Mild changes of cellulitis involving the right lower extremity. Possible mild myositis. No findings to suggest septic arthritis or osteomyelitis or pyomyositis. No radiopaque foreign body is identified. Please note however that wood splinters can be extremely difficult to see on  imaging studies. If there is strong clinical suspicion focused ultrasound examination may be the best modality to evaluate for possible wood splinters. IMPRESSION: 1. Open wound involving the anterior aspect of the right lower extremity near the junction of the middle and distal thirds of the tibia. There is mild skin thickening and irregularity but no definite rim enhancing abscess. 2. No definite radiopaque foreign body. Please see above discussion. 3. Mild changes of cellulitis involving the right lower extremity. Possible mild myositis. No findings to suggest septic arthritis or osteomyelitis or pyomyositis. 4. Intact right knee prosthesis. 5. Age related advanced osteoporosis. Electronically Signed   By: Marijo Sanes M.D.   On: 09/27/2021 15:43    Assessment/Plan  Cellulitis of right lower leg Afebrile. Right leg 7 cm x 5 cm with 1 cm erythema surround the wound.Wound bed covered with scab.Non-tender to touch. No drainage noted.  Start on Doxycycline as below.Probiotics recommended but states takes with Yogurt.  - Keep right lower leg wound clean and dry.Notify provider for any signs of infection.  - doxycycline (VIBRA-TABS) 100 MG tablet; Take 1 tablet (100 mg total) by mouth 2 (two) times daily for 10 days.  Dispense: 20 tablet; Refill: 0   Family/ staff Communication: Reviewed plan of care with patient verbalized understanding.  Labs/tests ordered: None   Next Appointment: As needed if symptoms worsen or fail to improve    Sandrea Hughs, NP

## 2021-10-13 DIAGNOSIS — Z20822 Contact with and (suspected) exposure to covid-19: Secondary | ICD-10-CM | POA: Diagnosis not present

## 2021-11-10 ENCOUNTER — Telehealth (HOSPITAL_COMMUNITY): Payer: Self-pay

## 2021-11-10 NOTE — Telephone Encounter (Signed)
Attempted to contact patient to schedule OP MBS - left voicemail. ?

## 2021-11-17 ENCOUNTER — Other Ambulatory Visit (HOSPITAL_COMMUNITY): Payer: Self-pay

## 2021-11-17 DIAGNOSIS — R1310 Dysphagia, unspecified: Secondary | ICD-10-CM

## 2021-11-17 DIAGNOSIS — R059 Cough, unspecified: Secondary | ICD-10-CM

## 2021-11-22 ENCOUNTER — Ambulatory Visit (HOSPITAL_COMMUNITY): Payer: Medicare Other

## 2021-11-22 ENCOUNTER — Encounter (HOSPITAL_COMMUNITY): Payer: Medicare Other

## 2021-11-22 ENCOUNTER — Encounter (HOSPITAL_COMMUNITY): Payer: Self-pay

## 2021-11-27 ENCOUNTER — Other Ambulatory Visit: Payer: Self-pay

## 2021-11-27 ENCOUNTER — Ambulatory Visit: Payer: Medicare Other

## 2021-11-27 ENCOUNTER — Encounter: Payer: Self-pay | Admitting: Nurse Practitioner

## 2021-11-27 ENCOUNTER — Ambulatory Visit (INDEPENDENT_AMBULATORY_CARE_PROVIDER_SITE_OTHER): Payer: Medicare Other | Admitting: Nurse Practitioner

## 2021-11-27 VITALS — BP 120/80 | HR 81 | Temp 97.1°F | Ht 64.0 in | Wt 162.0 lb

## 2021-11-27 DIAGNOSIS — E7849 Other hyperlipidemia: Secondary | ICD-10-CM | POA: Diagnosis not present

## 2021-11-27 DIAGNOSIS — M810 Age-related osteoporosis without current pathological fracture: Secondary | ICD-10-CM

## 2021-11-27 DIAGNOSIS — I209 Angina pectoris, unspecified: Secondary | ICD-10-CM | POA: Diagnosis not present

## 2021-11-27 DIAGNOSIS — E039 Hypothyroidism, unspecified: Secondary | ICD-10-CM

## 2021-11-27 DIAGNOSIS — E0500 Thyrotoxicosis with diffuse goiter without thyrotoxic crisis or storm: Secondary | ICD-10-CM | POA: Diagnosis not present

## 2021-11-27 DIAGNOSIS — R739 Hyperglycemia, unspecified: Secondary | ICD-10-CM | POA: Diagnosis not present

## 2021-11-27 MED ORDER — DENOSUMAB 60 MG/ML ~~LOC~~ SOSY
60.0000 mg | PREFILLED_SYRINGE | Freq: Once | SUBCUTANEOUS | Status: AC
  Administered 2021-11-27: 10:00:00 60 mg via SUBCUTANEOUS

## 2021-11-27 NOTE — Patient Instructions (Signed)
We will call you for your annual wellness visit (it will show up from [place] number)

## 2021-11-27 NOTE — Progress Notes (Signed)
Careteam: Patient Care Team: Lauree Chandler, NP as PCP - General (Geriatric Medicine) Tanda Rockers, MD as Consulting Physician (Pulmonary Disease) Marica Otter, [place] (Optometry) Norma Fredrickson, MD as Consulting Physician (Psychiatry) Philemon Kingdom, MD as Consulting Physician (Internal Medicine) Leatha Gilding, MD as Referring Physician (Orthopedic Surgery) Harold Hedge, Darrick Grinder, MD as Consulting Physician (Allergy and Immunology) Ladene Artist, MD as Consulting Physician (Gastroenterology) Pedro Earls, MD as Attending Physician (Family Medicine) Melida Quitter, MD as Consulting Physician (Otolaryngology)  PLACE OF SERVICE:  [place] Directive information Does Patient Have a Medical Advance Directive?: No  Allergies  Allergen Reactions   Penicillins Hives   Sulfa Antibiotics Hives and Nausea And Vomiting    7-15    Chief Complaint  Patient presents with   Medical Management of Chronic Issues    6 month follow up. Patient fell about a week ago. No serious injuries, just knot on head   Health Maintenance    2nd COVID booster, dexa scan     HPI: Patient is a 87 y.o. female for routine follow up. Former patient of Dr Mariea Clonts. Pt with hx of hyperlipidemia, GERD, osteoporosis, anxiety. She has had multiple episodes of cellulitis in her leg due to falls with abrasion which led to infection. Labs work was obtained during ED visit on 10/03/21  Hyperlipidemia- on lipitor, no recent lipids  Depression/anxiety- on cymbalta, mood is stable. She lost her cat and this was difficult.   GERD- controlled on pepcid.   Pt with graves disease, subclinical hypothyroid- followed by endocrinology.   She reports balance issues but does walk 30 mins daily - has a cane but does not use it.   Reports she had pain around ribs and has had chronic pain since then. Reports this was misunderstood and she was started on metoprolol- she states she is NOT taking this and her  blood pressure looks great.   Review of Systems:  Review of Systems  Constitutional:  Negative for chills, fever and weight loss.  HENT:  Negative for tinnitus.   Respiratory:  Negative for cough, sputum production and shortness of breath.   Cardiovascular:  Negative for chest pain, palpitations and leg swelling.  Gastrointestinal:  Negative for abdominal pain, constipation, diarrhea and heartburn.  Genitourinary:  Negative for dysuria, frequency and urgency.  Musculoskeletal:  Negative for back pain, falls, joint pain and myalgias.  Skin: Negative.   Neurological:  Negative for dizziness and headaches.  Psychiatric/Behavioral:  Negative for depression and memory loss. The patient does not have insomnia.    Past Medical History:  Diagnosis Date   Anxiety    Arthritis    Asthma    Bronchitis    hx of    Chronic headaches    [facility]   COVID-19    Cystitis    1954   Depression    Ear pain    bilat    Hiatal hernia    History of blood clots    Lungs, per [place] new patient packet    History of blood transfusion    2010   Hx of blood clots 2007 & 2010   post TKR   Hyperthyroidism    Pelvic fracture (HCC)    hx of in three places secondary to fall    Pneumonia    hx of 1952   Pulmonary embolism ([place]) 1993   on HRT   Right wrist fracture    hx of 1995 has plate  secondary to fall    Shaking    hands bilat   Shingles     per [facility] new patient packet    Shortness of breath dyspnea    Skin cancer    basal cell    Spinal stenosis    Trauma    left lower leg 10/22/2014    Urinary frequency    Urticaria    Past Surgical History:  Procedure Laterality Date   ABDOMINAL HYSTERECTOMY  1969    per [facility] new patient packet    [place]    per [facility] new patient packet    COLONOSCOPY     diverticulosis   ESI   11/2013    C7-T1 ; Dr Nelva Bush   EYE SURGERY     bilat cataract surgery    INSERTION OF MESH N/A  07/08/2015   Procedure: INSERTION OF MESH;  Surgeon: Armandina Gemma, MD;  Location: [facility];  Service: General;  Laterality: N/A;   REPLACEMENT TOTAL KNEE BILATERAL  2007 & 2010   SP FACET INJECTION  01/19/14   RC2-3,3-4,C4-5   TONSILLECTOMY  1945    per [facility] new patient packet    VENTRAL HERNIA REPAIR N/A 07/08/2015   Procedure: VENTRAL ADULT REPAIR VENTRAL INCISIONAL HERNIA REPAIR;  Surgeon: Armandina Gemma, MD;  Location: [facility];  Service: General;  Laterality: N/A;   WRIST FRACTURE SURGERY Bilateral 10  years and 2 years/   WRIST SURGERY Left 06/2016   plate    Social History:   reports that she has never smoked. She has never used smokeless tobacco. She reports that she does not currently use alcohol after a past usage of about 4.0 standard drinks per week. She reports that she does not use drugs.  Family History  Problem Relation Age of Onset   Skin cancer Mother    Pneumonia Mother        1/2 lung loss per [facility] New Patient Packet    Clotting disorder Father        PTE X 2   Skin cancer Father    Heart failure Father        Per [place] New Patient Packet    Allergies Brother    Asthma Brother    Heart attack Brother        > 63   Bladder Cancer Brother    Diabetes Daughter    Migraines Daughter    Heart attack Daughter    Breast cancer Daughter    Schizophrenia Son        Per [facility] New Patient Packet    Lung disease Son        Per [place] New Patient Packet    Mental illness Son        suicide by hanging   Diabetes Son    Hepatitis C Son    Sudden death Other        2 M aunts & 2 M uncles in 27s   Stroke Neg Hx     Medications: Patient's Medications  New Prescriptions   No medications on file  Previous Medications   ASCORBIC ACID (VITAMIN C) 500 MG TABLET    Take 500 mg by mouth daily.   ATORVASTATIN (LIPITOR) 20 MG TABLET    Take 1 tablet (20 mg total) by mouth daily.   CHOLECALCIFEROL (VITAMIN D) 1000 UNITS TABLET    Take 1,000 Units by mouth daily.  CYANOCOBALAMIN (B-12) 3000  MCG SUBL    Place 1 tablet under the tongue daily.   DENOSUMAB (PROLIA) 60 MG/ML SOSY INJECTION    Inject 60 mg into the skin every 6 (six) months.   DULOXETINE (CYMBALTA) 30 MG CAPSULE    Take 30 mg by mouth daily.    FAMOTIDINE (PEPCID) 40 MG TABLET    TAKE 1 TABLET(40 MG) BY MOUTH AT BEDTIME   METOPROLOL SUCCINATE (TOPROL-XL) 25 MG 24 HR TABLET    TAKE 1 TABLET(25 MG) BY MOUTH DAILY WITH OR IMMEDIATELY FOLLOWING A MEAL  Modified Medications   No medications on file  Discontinued Medications   MUPIROCIN CREAM (BACTROBAN) 2 %    Apply 1 application topically 2 (two) times daily.    Physical Exam:  Vitals:   11/27/21 1007  BP: 120/80  Pulse: 81  Temp: (!) 97.1 F (36.2 C)  TempSrc: Temporal  SpO2: 97%  Weight: 162 lb (73.5 kg)  Height: 5\' 4"  (1.626 m)   Body mass index is 27.81 kg/m. Wt Readings from Last 3 Encounters:  11/27/21 162 lb (73.5 kg)  10/06/21 162 lb 12.8 oz (73.8 kg)  10/03/21 150 lb (68 kg)    Physical Exam Constitutional:      General: She is not in acute distress.    Appearance: She is well-developed. She is not diaphoretic.  HENT:     Head: Normocephalic and atraumatic.     Mouth/Throat:     Pharynx: No oropharyngeal exudate.  Eyes:     Conjunctiva/sclera: Conjunctivae normal.     Pupils: Pupils are equal, round, and reactive to light.  Cardiovascular:     Rate and Rhythm: Normal rate and regular rhythm.     Heart sounds: Normal heart sounds.  Pulmonary:     Effort: Pulmonary effort is normal.     Breath sounds: Normal breath sounds.  Abdominal:     General: Bowel sounds are normal.     Palpations: Abdomen is soft.  Musculoskeletal:     Cervical back: Normal range of motion and neck supple.     Right lower leg: No edema.     Left lower leg: No edema.  Skin:    General: Skin is warm and dry.  Neurological:     Mental Status: She is alert.  Psychiatric:        Mood and Affect: Mood normal.    Labs reviewed: Basic Metabolic Panel: Recent  Labs    01/30/21 1601 09/27/21 1244 10/03/21 1119  NA  --  136 136  K  --  4.9 4.8  CL  --  104 105  CO2  --  25 25  GLUCOSE  --  105* 120*  BUN  --  28* 29*  CREATININE  --  1.04* 1.13*  CALCIUM  --  9.3 9.0  TSH 5.86*  --   --    Liver Function Tests: Recent Labs    09/27/21 1244  AST 27  ALT 25  ALKPHOS 61  BILITOT 0.9  PROT 7.0  ALBUMIN 4.1   No results for input(s): LIPASE, AMYLASE in the last 8760 hours. No results for input(s): AMMONIA in the last 8760 hours. CBC: Recent Labs    09/27/21 1244 10/03/21 1119  WBC 7.4 6.8  NEUTROABS 4.8 4.3  HGB 12.5 12.8  HCT 38.3 39.1  MCV 97.0 98.5  PLT 275 265   Lipid Panel: No results for input(s): CHOL, HDL, LDLCALC, TRIG, CHOLHDL, LDLDIRECT in the last 8760 hours.  TSH: Recent Labs    01/30/21 1601  TSH 5.86*   A1C: Lab Results  Component Value Date   HGBA1C 5.6 01/05/2021     Assessment/Plan 1. Age-related osteoporosis without current pathological fracture -Recommended to take calcium 600 mg twice daily with Vitamin D 2000 units daily and weight bearing activity 30 mins/5 days a week - denosumab (PROLIA) injection 60 mg - DG Bone Density; Future - BASIC METABOLIC PANEL WITH GFR  2. Hypothyroidism, unspecified type -subclinical will follow up tsh.  - TSH  3. Graves disease -followed up endocrinology, will follow up TSH at this time.  - TSH  4. Other hyperlipidemia -continues on low cholesterol diet with increase in physical activity.  - Lipid panel  5. Hyperglycemia -continues on dietary modifications.  - BASIC METABOLIC PANEL WITH GFR   Next appt: 6 months for routine follow up.  11/30/2021 for AW[facility] Charlsie Fleeger K. Peck, [facility] [phone]

## 2021-11-28 LAB — BASIC METABOLIC PANEL WITH GFR
BUN/Creatinine Ratio: 24 (calc) — ABNORMAL HIGH (ref 6–22)
BUN: 24 mg/dL (ref 7–25)
CO2: 27 mmol/L (ref 20–32)
Calcium: 9.3 mg/dL (ref 8.6–10.4)
Chloride: 105 mmol/L (ref 98–110)
Creat: 0.99 mg/dL — ABNORMAL HIGH (ref 0.60–0.95)
Glucose, Bld: 93 mg/dL (ref 65–139)
Potassium: 4.8 mmol/L (ref 3.5–5.3)
Sodium: 139 mmol/L (ref 135–146)
eGFR: 55 mL/min/{1.73_m2} — ABNORMAL LOW (ref >=60)

## 2021-11-28 LAB — TSH: TSH: 4.4 mIU/L (ref 0.40–4.50)

## 2021-11-28 LAB — LIPID PANEL
Cholesterol: 173 mg/dL (ref <200)
HDL: 73 mg/dL (ref >=50)
LDL Cholesterol (Calc): 83 mg/dL (calc)
Non-HDL Cholesterol (Calc): 100 mg/dL (calc) (ref <130)
Total CHOL/HDL Ratio: 2.4 (calc) (ref <5.0)
Triglycerides: 84 mg/dL (ref <150)

## 2021-11-29 ENCOUNTER — Other Ambulatory Visit: Payer: Self-pay

## 2021-11-29 ENCOUNTER — Ambulatory Visit (HOSPITAL_COMMUNITY)
Admission: RE | Admit: 2021-11-29 | Discharge: 2021-11-29 | Disposition: A | Discharge status: Home / Self Care | Payer: Medicare Other | Source: Ambulatory Visit | Attending: Otolaryngology | Admitting: Otolaryngology

## 2021-11-29 DIAGNOSIS — R1310 Dysphagia, unspecified: Secondary | ICD-10-CM | POA: Insufficient documentation

## 2021-11-29 DIAGNOSIS — R059 Cough, unspecified: Secondary | ICD-10-CM | POA: Insufficient documentation

## 2021-11-29 DIAGNOSIS — R1313 Dysphagia, pharyngeal phase: Secondary | ICD-10-CM | POA: Insufficient documentation

## 2021-11-29 NOTE — Progress Notes (Signed)
Modified Barium Swallow Progress Note  Patient Details  Name: Faith Edwards MRN: [id number] Date of Birth: 09/01/1932  Today's Date: 11/29/2021  Modified Barium Swallow completed.  Full report located under Chart Review in the Imaging Section.  Brief recommendations include the following:  Clinical Impression  Pt was seen for an outpatient MBS after reports of increased coughing during mealtimes, pain when swallowing large pills, and intermittent globus sensation. Overall, pt anatomy exhibited functional oral and pharyngeal swallowing across all consistencies. During pharyngeal phase, pt showed cricopharyngeal bar resulting in slightly slowed passage of bolus through the pharynx, however swallow is functional across all consistencies. MBS does not diagnose esophageal dysphagia, however, quick scan down of the esophagus revealed no apparent dismotility or difficulty. Pt reports difficulty taking large vitamins but owns a pill crusher. SLP educated pt re: crushing large pills when necessary. Recommend continue with regular/thin liquid diet, taking medications whole with thin liquid or crushed when large. Pt requires no further ST services.   Swallow Evaluation Recommendations       SLP Diet Recommendations: Thin liquid;Regular solids   Liquid Administration via: Cup;Straw   Medication Administration: Whole meds with liquid   Supervision: Patient able to self feed       Postural Changes: Remain semi-upright after after feeds/meals (Comment);Seated upright at 90 degrees   Oral Care Recommendations: Oral care BID        Faith Edwards 11/29/2021,1:16 PM  Orbie Pyo Sutton.Ed Risk analyst [phone] Office [phone]

## 2021-11-30 ENCOUNTER — Encounter: Payer: Medicare Other | Admitting: Nurse Practitioner

## 2021-11-30 ENCOUNTER — Telehealth: Payer: Self-pay

## 2021-11-30 NOTE — Progress Notes (Signed)
This service is provided via telemedicine  No vital signs collected/recorded due to the encounter was a telemedicine visit.   Location of patient (ex: home, work):  Home  Patient consents to a telephone visit:  Yes, see encounter dated 11/30/2021  Location of the provider (ex: office, home):  [place]  Name of any referring provider:  N/A  Names of all persons participating in the telemedicine service and their role in the encounter:  Sherrie Mustache, Nurse Practitioner, Carroll Kinds, CMA, and patient.   Time spent on call:  9 minutes with medical assistant

## 2021-11-30 NOTE — Telephone Encounter (Signed)
Made 3 unsuccessful attempts to reach patient to conduct AWV. Patient was left a voicemail on each attempt as to where I was calling from, my name and that the caller ID may say [place] or [place]. After 1st attempt patient turned phone completely off. On last attempt patient was informed that she would need to call to reschedule appt.  1st attempt-8:46 vm left 2nd attempt- 8:53 straight vm message left 3rd attempt- 9:04 am straight vm left message

## 2021-12-03 ENCOUNTER — Other Ambulatory Visit: Payer: Self-pay | Admitting: Gastroenterology

## 2021-12-04 ENCOUNTER — Other Ambulatory Visit: Payer: Self-pay | Admitting: Gastroenterology

## 2021-12-05 ENCOUNTER — Ambulatory Visit: Payer: Medicare Other | Admitting: Gastroenterology

## 2022-01-08 ENCOUNTER — Other Ambulatory Visit: Payer: Self-pay | Admitting: *Deleted

## 2022-01-08 MED ORDER — ATORVASTATIN CALCIUM 20 MG PO TABS: 20.0000 mg | ORAL_TABLET | Freq: Every day | ORAL | 3 refills | Status: DC

## 2022-01-08 NOTE — Telephone Encounter (Signed)
Pharmacy Requested refill.  

## 2022-01-11 DIAGNOSIS — D1801 Hemangioma of skin and subcutaneous tissue: Secondary | ICD-10-CM | POA: Diagnosis not present

## 2022-01-11 DIAGNOSIS — L570 Actinic keratosis: Secondary | ICD-10-CM | POA: Diagnosis not present

## 2022-01-11 DIAGNOSIS — Z23 Encounter for immunization: Secondary | ICD-10-CM | POA: Diagnosis not present

## 2022-01-11 DIAGNOSIS — L814 Other melanin hyperpigmentation: Secondary | ICD-10-CM | POA: Diagnosis not present

## 2022-01-11 DIAGNOSIS — Z85828 Personal history of other malignant neoplasm of skin: Secondary | ICD-10-CM | POA: Diagnosis not present

## 2022-01-11 DIAGNOSIS — B353 Tinea pedis: Secondary | ICD-10-CM | POA: Diagnosis not present

## 2022-01-11 DIAGNOSIS — D229 Melanocytic nevi, unspecified: Secondary | ICD-10-CM | POA: Diagnosis not present

## 2022-01-11 DIAGNOSIS — L578 Other skin changes due to chronic exposure to nonionizing radiation: Secondary | ICD-10-CM | POA: Diagnosis not present

## 2022-01-11 DIAGNOSIS — L853 Xerosis cutis: Secondary | ICD-10-CM | POA: Diagnosis not present

## 2022-01-11 DIAGNOSIS — L649 Androgenic alopecia, unspecified: Secondary | ICD-10-CM | POA: Diagnosis not present

## 2022-01-11 DIAGNOSIS — L821 Other seborrheic keratosis: Secondary | ICD-10-CM | POA: Diagnosis not present

## 2022-01-27 IMAGING — DX DG TIBIA/FIBULA 2V*R*
3 series · 3 of 3 positions shown · non-contrast
Comparison: Knee radiographs from 07/26/2016

CLINICAL DATA: Nonhealing leg wound

EXAM:
RIGHT TIBIA AND FIBULA - 2 VIEW

[tibia ap (1 of 2)]
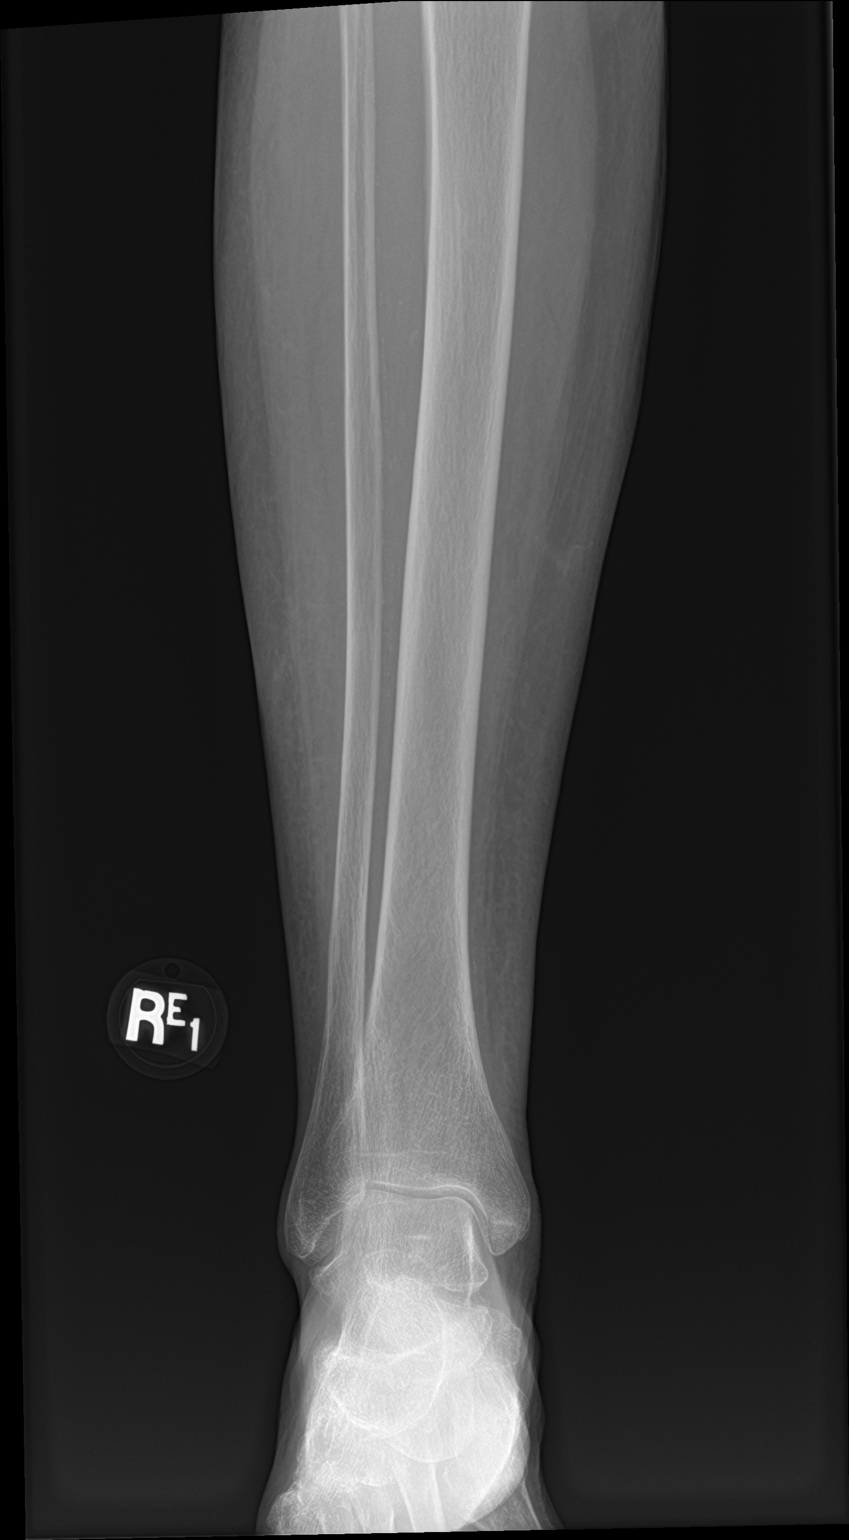

[tibia ap (2 of 2)]
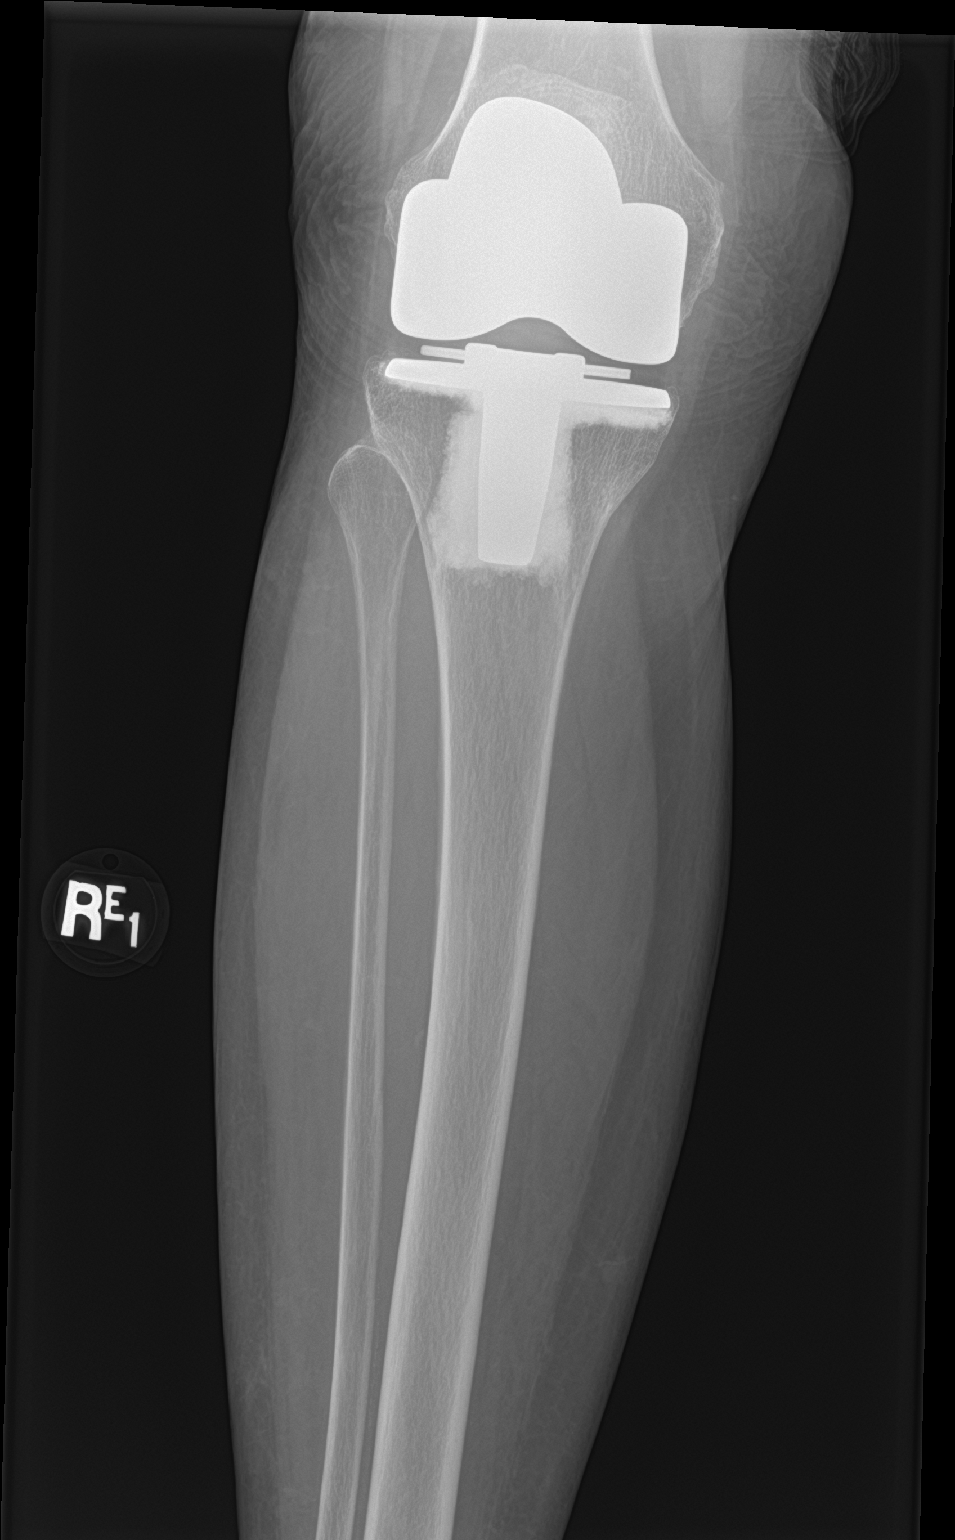

[tibia lat]
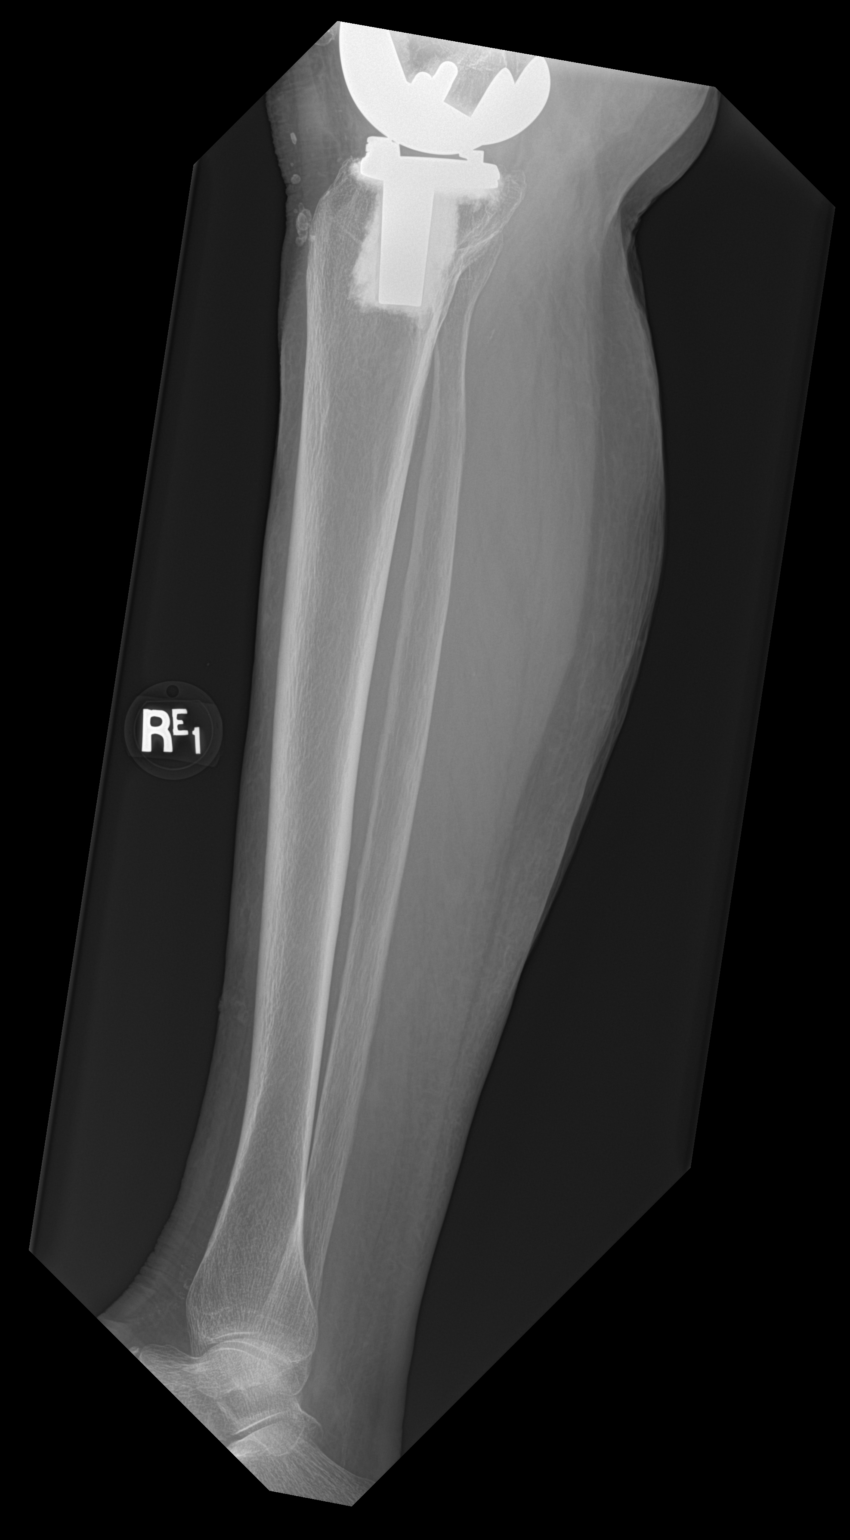

[3 of 3 positions shown; findings below may reference images not displayed]

FINDINGS: Total knee prosthesis. No fracture or acute bony findings. No gas
tracking in the soft tissues. No obvious foreign body. Small
calcifications anterior to the distal patellar tendon similar to the
prior exam. Questionable cutaneous irregularity anterior to the
junction of the mid and distal thirds of the tibia.
IMPRESSION: 1. No fracture, foreign body, or gas tracking in the soft tissues
identified.
2. Questionable cutaneous irregularity anterior to the junction of
the mid and distal thirds of the tibia.
3. Total knee prosthesis.

## 2022-01-27 IMAGING — CT CT TIBIA FIBULA *R* W/ CM
3 of 6 series · 11 of 33 positions shown, 13 images · IV contrast (omnipaque)
Comparison: Radiographs, same date.

CLINICAL DATA: Recent trauma. Patient fell 09/15/2021. Persistent
wound involving the anterior aspect of the right lower extremity.

EXAM:
CT OF THE LOWER RIGHT EXTREMITY WITH CONTRAST
TECHNIQUE: Multidetector CT imaging of the lower right extremity was performed
according to the standard protocol following intravenous contrast
administration.
CONTRAST:  85mL OMNIPAQUE IOHEXOL 350 MG/ML SOLN

[Series 4: axial bone · axial · 0.29mm/px · z∈[-1266,-954]mm · 5 of 469 slices shown, 7 images]
[im 79/469  soft-tissue]
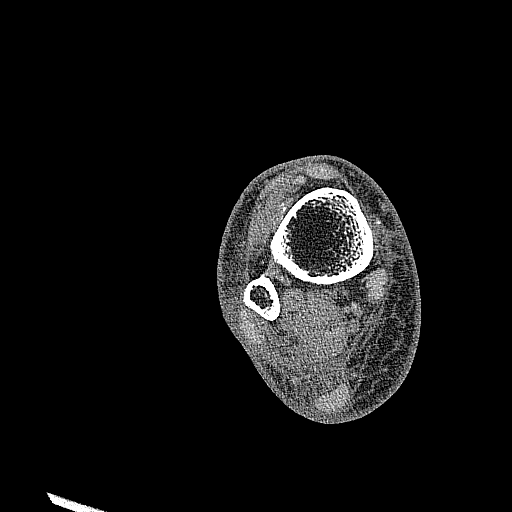
[im 79/469  bone]
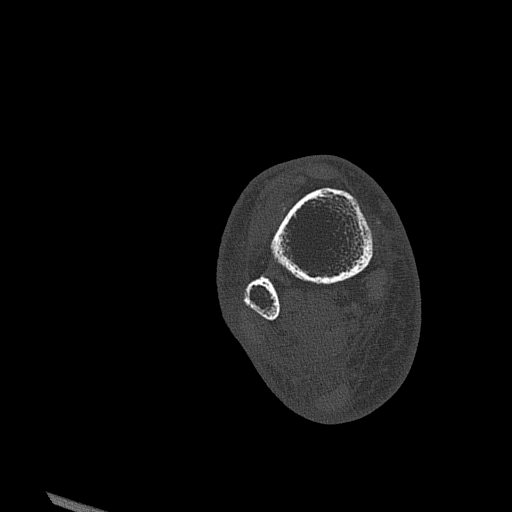
[im 157/469  bone]
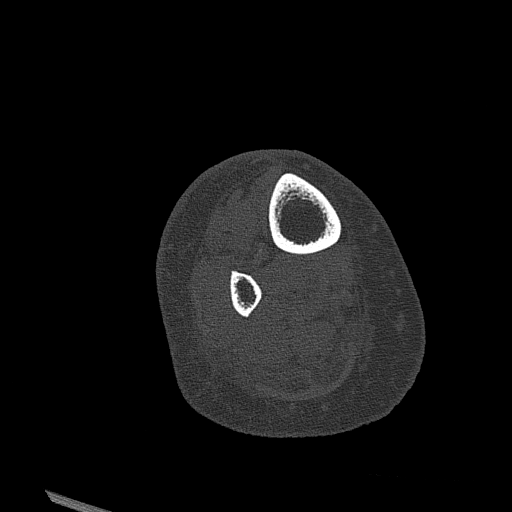
[im 235/469  bone]
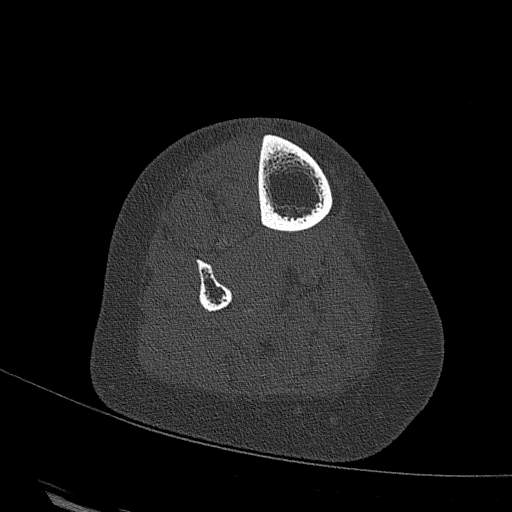
[im 313/469  bone]
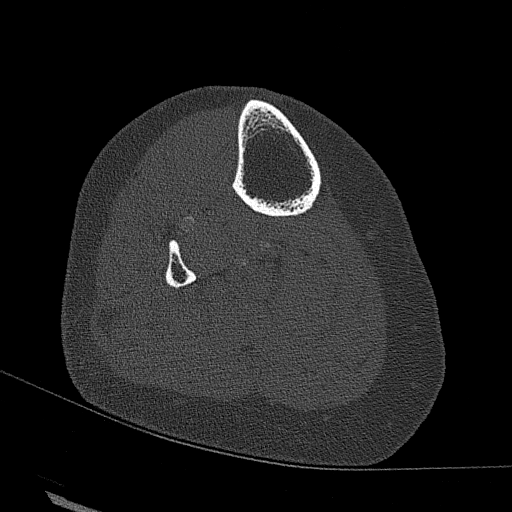
[im 391/469  soft-tissue]
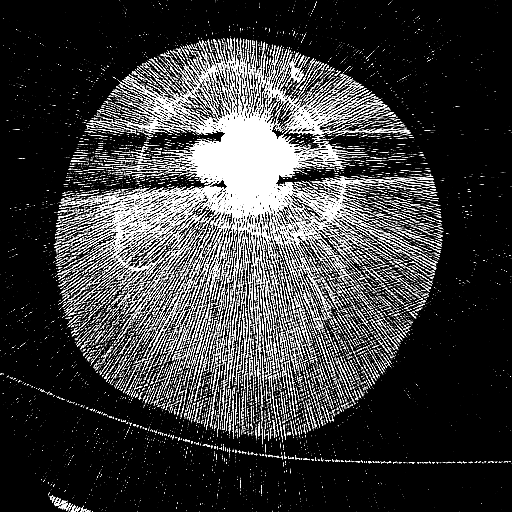
[im 391/469  bone]
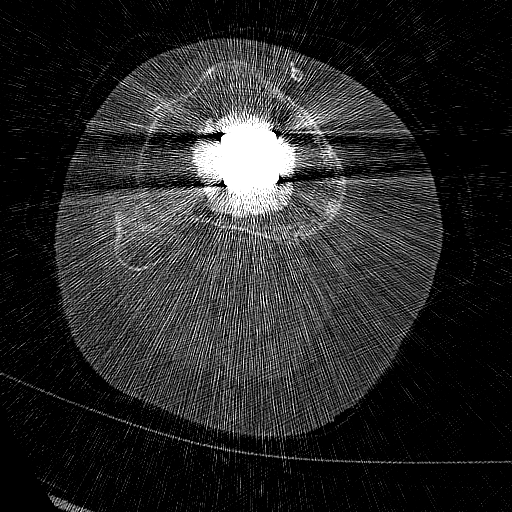

[Series 5: cor bone · coronal · 0.29mm/px · 1 of 147 slices shown]
[im 74/147  bone]
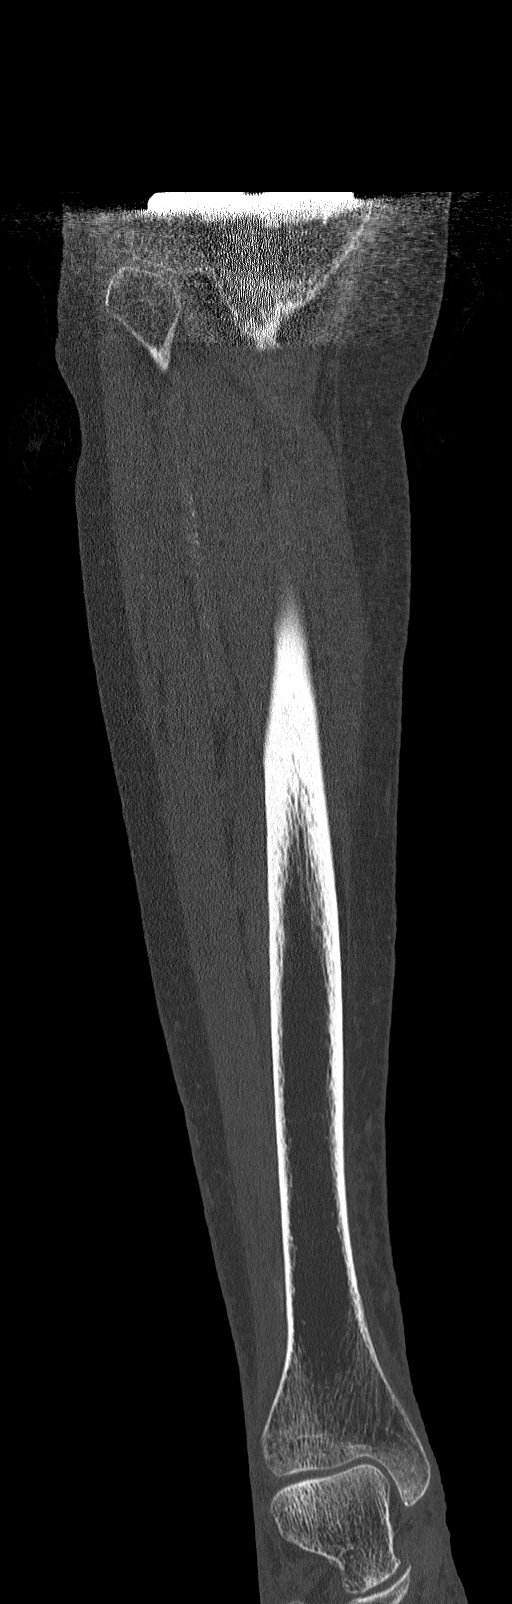

[Series 7: axial st · axial · 0.29mm/px · z∈[-1266,-954]mm · 5 of 469 slices shown]
[im 79/469  bone]
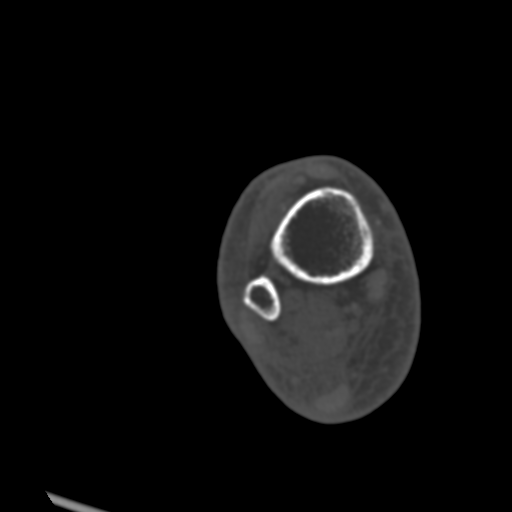
[im 157/469  bone]
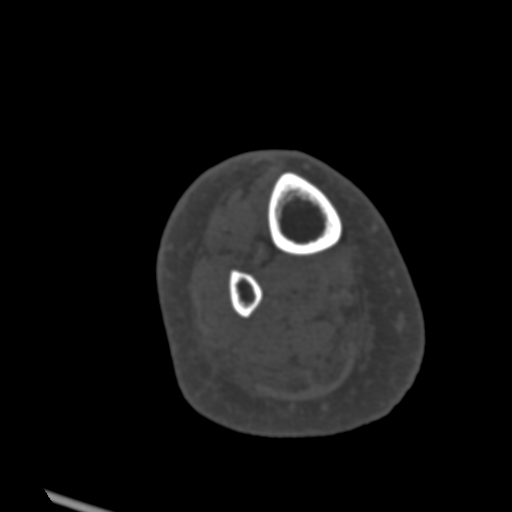
[im 235/469  bone]
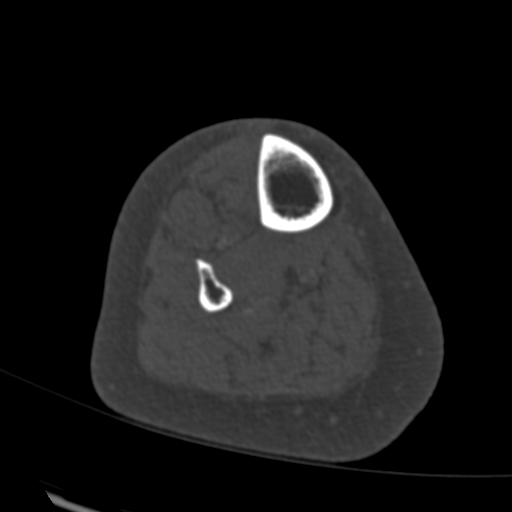
[im 313/469  bone]
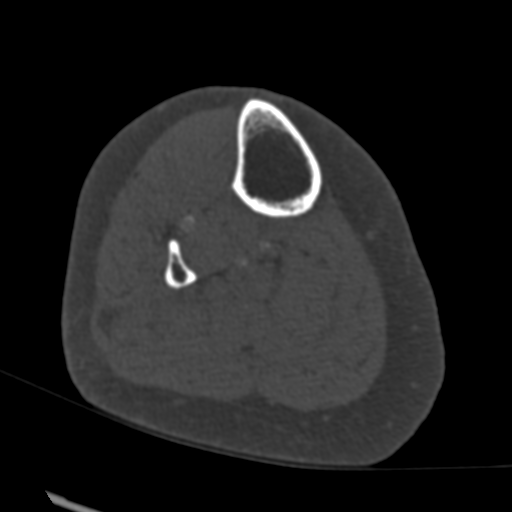
[im 391/469  bone]
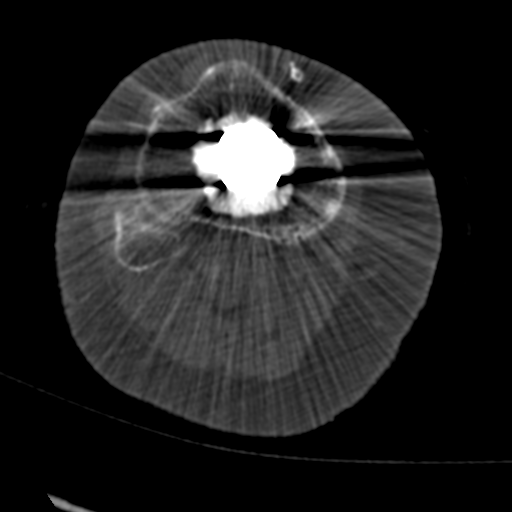

[11 of 33 positions shown; findings below may reference images not displayed]

FINDINGS: The bony structures are intact. No fracture of the tibia or fibula
is identified. Age related advanced osteoporosis is noted. The right
knee prosthesis appears intact. No complicating features are
identified. The ankle joint is maintained. No ankle joint effusion.

There is a wound involving the anterior aspect of the right lower
extremity near the junction of the middle and distal thirds of the
tibia. There is mild skin thickening and irregularity but I do not
see any definite rim enhancing abscess. Mild changes of cellulitis
involving the right lower extremity. Possible mild myositis. No
findings to suggest septic arthritis or osteomyelitis or
pyomyositis.

No radiopaque foreign body is identified. Please note however that
Brody Navaro Loriel splinters can be extremely difficult to see on imaging studies.
If there is strong clinical suspicion focused ultrasound examination
may be the best modality to evaluate for possible Brody Navaro Loriel splinters.
IMPRESSION: 1. Open wound involving the anterior aspect of the right lower
extremity near the junction of the middle and distal thirds of the
tibia. There is mild skin thickening and irregularity but no
definite rim enhancing abscess.
2. No definite radiopaque foreign body. Please see above discussion.
3. Mild changes of cellulitis involving the right lower extremity.
Possible mild myositis. No findings to suggest septic arthritis or
osteomyelitis or pyomyositis.
4. Intact right knee prosthesis.
5. Age related advanced osteoporosis.

## 2022-01-29 ENCOUNTER — Ambulatory Visit: Payer: Medicare Other | Admitting: Internal Medicine

## 2022-01-30 ENCOUNTER — Encounter: Payer: Self-pay | Admitting: Internal Medicine

## 2022-01-30 ENCOUNTER — Other Ambulatory Visit: Payer: Self-pay

## 2022-01-30 ENCOUNTER — Ambulatory Visit (INDEPENDENT_AMBULATORY_CARE_PROVIDER_SITE_OTHER): Payer: Medicare Other | Admitting: Internal Medicine

## 2022-01-30 ENCOUNTER — Ambulatory Visit: Payer: Medicare Other | Admitting: Internal Medicine

## 2022-01-30 VITALS — BP 128/76 | HR 91 | Temp 98.1°F | Ht 64.0 in | Wt 167.0 lb

## 2022-01-30 DIAGNOSIS — M65311 Trigger thumb, right thumb: Secondary | ICD-10-CM | POA: Diagnosis not present

## 2022-01-30 DIAGNOSIS — R270 Ataxia, unspecified: Secondary | ICD-10-CM | POA: Diagnosis not present

## 2022-01-30 DIAGNOSIS — M65351 Trigger finger, right little finger: Secondary | ICD-10-CM | POA: Diagnosis not present

## 2022-01-30 NOTE — Progress Notes (Signed)
Subjective:  Patient ID: Faith Edwards, female    DOB: Jun 21, 1932  Age: 88 y.o. MRN: [id number]  CC: Gait Problem  This visit occurred during the SARS-CoV-2 public health emergency.  Safety protocols were in place, including screening questions prior to the visit, additional usage of staff PPE, and extensive cleaning of exam room while observing appropriate contact time as indicated for disinfecting solutions.    HPI Faith Edwards presents for establishing -  She complains of a 5-day history of ringing in her ears, dizziness, and ataxia.  She describes the tinnitus as hearing hammers hitting wood.  She has a history of hearing loss and is not wearing her hearing aids.  The day the symptoms started she has decided to abruptly stop taking duloxetine.  History Faith Edwards has a past medical history of Anxiety, Arthritis, Asthma, Bronchitis, Chronic headaches, COVID-19, Cystitis, Depression, Ear pain, Hiatal hernia, History of blood clots, History of blood transfusion, blood clots (2007 & 2010), Hyperthyroidism, Pelvic fracture ([place]), Pneumonia, Pulmonary embolism ([place]) (1993), Right wrist fracture, Shaking, Shingles, Shortness of breath dyspnea, Skin cancer, Spinal stenosis, Trauma, Urinary frequency, and Urticaria.   She has a past surgical history that includes Replacement total knee bilateral (2007 & 2010); Cholecystectomy (1992); Colonoscopy; ESI ( 11/2013); Breast enhancement surgery (1985); SP FACET INJECTION (01/19/14); Abdominal hysterectomy (1969); Tonsillectomy (1945); Eye surgery; Ventral hernia repair (N/A, 07/08/2015); Insertion of mesh (N/A, 07/08/2015); Wrist surgery (Left, 06/2016); Adenoidectomy; and Wrist fracture surgery (Bilateral, 10  years and 2 years/).   Her family history includes Allergies in her brother; Asthma in her brother; Bladder Cancer in her brother; Breast cancer in her daughter; Clotting disorder in her father; Diabetes in her daughter and son; Heart attack in her brother and  daughter; Heart failure in her father; Hepatitis C in her son; Lung disease in her son; Mental illness in her son; Migraines in her daughter; Pneumonia in her mother; Schizophrenia in her son; Skin cancer in her father and mother; Sudden death in an other family member.She reports that she has never smoked. She has never used smokeless tobacco. She reports that she does not currently use alcohol after a past usage of about 4.0 standard drinks per week. She reports that she does not use drugs.  Outpatient Medications Prior to Visit  Medication Sig Dispense Refill   ascorbic acid (VITAMIN C) 500 MG tablet Take 500 mg by mouth daily.     atorvastatin (LIPITOR) 20 MG tablet Take 1 tablet (20 mg total) by mouth daily. 90 tablet 3   cholecalciferol (VITAMIN D) 1000 UNITS tablet Take 1,000 Units by mouth daily.     Cyanocobalamin (B-12) 3000 MCG SUBL Place 1 tablet under the tongue daily.     denosumab (PROLIA) 60 MG/ML SOSY injection Inject 60 mg into the skin every 6 (six) months.     famotidine (PEPCID) 40 MG tablet TAKE 1 TABLET(40 MG) BY MOUTH AT BEDTIME 90 tablet 0   DULoxetine (CYMBALTA) 30 MG capsule Take 30 mg by mouth daily.  (Patient not taking: Reported on 01/30/2022)  4   No facility-administered medications prior to visit.    ROS Review of Systems  Constitutional:  Negative for chills, diaphoresis, fatigue, fever and unexpected weight change.  HENT:  Positive for hearing loss and tinnitus. Negative for trouble swallowing and voice change.   Respiratory: Negative.  Negative for cough, chest tightness, shortness of breath and wheezing.   Cardiovascular:  Negative for chest pain, palpitations and leg swelling.  Gastrointestinal:  Negative for abdominal pain, constipation, diarrhea and vomiting.  Genitourinary: Negative.   Musculoskeletal: Negative.   Skin: Negative.  Negative for color change.  Neurological:  Positive for dizziness. Negative for syncope, weakness, light-headedness,  numbness and headaches.  Hematological:  Negative for adenopathy. Does not bruise/bleed easily.  Psychiatric/Behavioral:  Positive for confusion and decreased concentration. The patient is not nervous/anxious.    Objective:  BP 128/76 (BP Location: Right Arm, Patient Position: Sitting, Cuff Size: Normal)    Pulse 91    Temp 98.1 F (36.7 C) (Oral)    Ht 5\' 4"  (1.626 m)    Wt 167 lb (75.8 kg)    SpO2 99%    BMI 28.67 kg/m   Physical Exam Vitals reviewed.  HENT:     Right Ear: Hearing, tympanic membrane, ear canal and external ear normal. No middle ear effusion.     Left Ear: Hearing, tympanic membrane, ear canal and external ear normal.  No middle ear effusion.     Nose: Nose normal.     Mouth/Throat:     Mouth: Mucous membranes are moist.  Eyes:     General: No scleral icterus.    Extraocular Movements: Extraocular movements intact.     Conjunctiva/sclera: Conjunctivae normal.     Pupils: Pupils are equal, round, and reactive to light.  Cardiovascular:     Rate and Rhythm: Normal rate and regular rhythm.     Heart sounds: No murmur heard. Pulmonary:     Effort: Pulmonary effort is normal.     Breath sounds: No stridor. No wheezing, rhonchi or rales.  Abdominal:     General: Abdomen is flat.     Palpations: There is no mass.     Tenderness: There is no abdominal tenderness. There is no guarding.     Hernia: No hernia is present.  Musculoskeletal:        General: No tenderness. Normal range of motion.     Cervical back: Neck supple.     Right lower leg: No edema.  Lymphadenopathy:     Cervical: No cervical adenopathy.  Skin:    General: Skin is warm and dry.     Findings: No rash.  Neurological:     Cranial Nerves: Cranial nerves 2-12 are intact.     Motor: Weakness present. No tremor, atrophy or seizure activity.     Coordination: Romberg sign positive. Coordination abnormal. Finger-Nose-Finger Test abnormal and Heel to [facility] Test abnormal. Impaired rapid alternating  movements.     Deep Tendon Reflexes: Babinski sign absent on the right side. Babinski sign absent on the left side.     Reflex Scores:      Tricep reflexes are 0 on the right side and 0 on the left side.      Bicep reflexes are 0 on the right side and 0 on the left side.      Brachioradialis reflexes are 0 on the right side and 0 on the left side.      Patellar reflexes are 0 on the right side and 0 on the left side.      Achilles reflexes are 0 on the right side and 0 on the left side. Psychiatric:        Mood and Affect: Mood normal.        Behavior: Behavior normal.    Lab Results  Component Value Date   WBC 6.8 10/03/2021   HGB 12.8 10/03/2021   HCT 39.1 10/03/2021   PLT 265 10/03/2021  GLUCOSE 93 11/27/2021   CHOL 173 11/27/2021   TRIG 84 11/27/2021   HDL 73 11/27/2021   LDLCALC 83 11/27/2021   ALT 25 09/27/2021   AST 27 09/27/2021   NA 139 11/27/2021   K 4.8 11/27/2021   CL 105 11/27/2021   CREATININE 0.99 (H) 11/27/2021   BUN 24 11/27/2021   CO2 27 11/27/2021   TSH 4.40 11/27/2021   INR 1.80 (H) 02/06/2010   HGBA1C 5.6 01/05/2021     Assessment & Plan:   Miyonna was seen today for gait problem.  Diagnoses and all orders for this visit:  Ataxia- Her symptoms could be related to the abrupt discontinuation of duloxetine however she is not willing to restart it.  I am concerned about NPH, CVA, mass, demyelination, or auditory tumor.  I recommended that she undergo an MRI as soon as possible. -     MR Brain Wo Contrast; Future   I am having Silvano Rusk. Berroa maintain her cholecalciferol, DULoxetine, B-12, ascorbic acid, denosumab, famotidine, and atorvastatin.  No orders of the defined types were placed in this encounter.    Follow-up: No follow-ups on file.  Scarlette Calico, MD

## 2022-01-31 ENCOUNTER — Ambulatory Visit
Admission: RE | Admit: 2022-01-31 | Discharge: 2022-01-31 | Disposition: A | Discharge status: Home / Self Care | Payer: Medicare Other | Source: Ambulatory Visit | Attending: Internal Medicine | Admitting: Internal Medicine

## 2022-01-31 DIAGNOSIS — R42 Dizziness and giddiness: Secondary | ICD-10-CM | POA: Diagnosis not present

## 2022-01-31 DIAGNOSIS — R270 Ataxia, unspecified: Secondary | ICD-10-CM

## 2022-02-08 ENCOUNTER — Telehealth: Payer: Self-pay

## 2022-02-08 NOTE — Telephone Encounter (Signed)
I have advised the pt of Dr. Ronnald Ramp recommendation and she is agreeing to the neurologist referral.  Pt Cb [phone]

## 2022-02-12 ENCOUNTER — Other Ambulatory Visit: Payer: Self-pay

## 2022-02-12 ENCOUNTER — Ambulatory Visit (INDEPENDENT_AMBULATORY_CARE_PROVIDER_SITE_OTHER): Payer: Medicare Other | Admitting: Internal Medicine

## 2022-02-12 ENCOUNTER — Encounter: Payer: Self-pay | Admitting: Internal Medicine

## 2022-02-12 VITALS — BP 110/68 | HR 71 | Ht 64.0 in | Wt 163.8 lb

## 2022-02-12 DIAGNOSIS — R590 Localized enlarged lymph nodes: Secondary | ICD-10-CM | POA: Diagnosis not present

## 2022-02-12 DIAGNOSIS — E039 Hypothyroidism, unspecified: Secondary | ICD-10-CM

## 2022-02-12 NOTE — Progress Notes (Signed)
Patient ID: Faith Edwards, female   DOB: 02-22-1932, 88 y.o.   MRN: [id number]  This visit occurred during the SARS-CoV-2 public health emergency.  Safety protocols were in place, including screening questions prior to the visit, additional usage of staff PPE, and extensive cleaning of exam room while observing appropriate contact time as indicated for disinfecting solutions.   HPI  Faith Edwards is a 88 y.o.-year-old female, returning for f/u for Graves ds. Last visit 1 year ago. She will see DR. Ronnald Ramp.  Interim history: In 2021, she complained of a right low cervical nodule-on palpation, this appeared to be a small cervical lymph node.  She is not bothered by this anymore. Since last visit, she had ataxia.  She had a cellulitis episode due to abrasions on legs due to falls.  She also had a left fifth metatarsal fracture in 04/2021. She also describes allergies and cough.  She is not taking any medications for this.  Reviewed her TFTs - last TSH was slightly higher than normal: Lab Results  Component Value Date   TSH 4.40 11/27/2021   TSH 5.86 (H) 01/30/2021   TSH 4.63 (H) 10/20/2020   TSH 4.18 10/13/2019   TSH 2.32 07/07/2018   TSH 2.07 08/30/2017   TSH 2.01 08/30/2016   TSH 3.08 04/06/2016   TSH 2.20 01/04/2016   TSH 3.09 11/16/2015   FREET4 0.74 01/30/2021   FREET4 0.86 07/07/2018   FREET4 0.81 08/30/2017   FREET4 0.79 08/30/2016   FREET4 0.77 04/06/2016   FREET4 0.79 01/04/2016   FREET4 0.62 11/16/2015   FREET4 1.02 09/29/2015   FREET4 0.9 08/05/2008    Her TSI levels remain high: Lab Results  Component Value Date   TSI 442 (H) 07/07/2018   TSI 524 (H) 04/06/2016   TSI 531 (H) 09/29/2015   We diagnosed Graves' disease based on elevated TSI's and disease course.  We initially had her on methimazole, decreased to 2.5 mg daily.  She stopped this by herself in 02/2016.  She was also on atenolol in the past, which she stopped.  Pt denies: - feeling nodules in neck -  hoarseness - dysphagia - choking - SOB with lying down  She continues to have some fatigue, heat intolerance, hair loss.  Pt does have a FH of thyroid ds.: her mother. No FH of thyroid cancer. No h/o radiation tx to head or neck. No herbal supplements. No Biotin use. No recent steroids use.   She has COPD. Also, OP - on Prolia.  ROS: + see HPI Skin: no rashes, + hair loss  I reviewed pt's medications, allergies, PMH, social hx, family hx, and changes were documented in the history of present illness. Otherwise, unchanged from my initial visit note.  Past Medical History:  Diagnosis Date   Anxiety    Arthritis    Asthma    Bronchitis    hx of    Chronic headaches    [facility]   COVID-19    Cystitis    1954   Depression    Ear pain    bilat    Hiatal hernia    History of blood clots    Lungs, per [place] new patient packet    History of blood transfusion    2010   Hx of blood clots 2007 & 2010   post TKR   Hyperthyroidism    Pelvic fracture (HCC)    hx of in three places secondary to fall    Pneumonia  hx of 1952   Pulmonary embolism ([place]) 1993   on HRT   Right wrist fracture    hx of 1995 has plate secondary to fall    Shaking    hands bilat   Shingles     per [facility] new patient packet    Shortness of breath dyspnea    Skin cancer    basal cell    Spinal stenosis    Trauma    left lower leg 10/22/2014    Urinary frequency    Urticaria    Past Surgical History:  Procedure Laterality Date   ABDOMINAL HYSTERECTOMY  1969    per [facility] new patient packet    [place]    per [facility] new patient packet    COLONOSCOPY     diverticulosis   ESI   11/2013    C7-T1 ; Dr Nelva Bush   EYE SURGERY     bilat cataract surgery    INSERTION OF MESH N/A 07/08/2015   Procedure: INSERTION OF MESH;  Surgeon: Armandina Gemma, MD;  Location: [facility];  Service: General;  Laterality: N/A;   REPLACEMENT TOTAL KNEE  BILATERAL  2007 & 2010   SP FACET INJECTION  01/19/14   RC2-3,3-4,C4-5   TONSILLECTOMY  1945    per [facility] new patient packet    VENTRAL HERNIA REPAIR N/A 07/08/2015   Procedure: VENTRAL ADULT REPAIR VENTRAL INCISIONAL HERNIA REPAIR;  Surgeon: Armandina Gemma, MD;  Location: [facility];  Service: General;  Laterality: N/A;   WRIST FRACTURE SURGERY Bilateral 10  years and 2 years/   WRIST SURGERY Left 06/2016   plate    Social History   Socioeconomic History   Marital status: Married    Spouse name: Not on file   Number of children: 3   Years of education: Not on file   Highest education level: Not on file  Occupational History   Occupation: retired  Tobacco Use   Smoking status: Never   Smokeless tobacco: Never  Vaping Use   Vaping Use: Never used  Substance and Sexual Activity   Alcohol use: Not Currently    Alcohol/week: 4.0 standard drinks    Types: 4 Glasses of wine per week    Comment: minimal   Drug use: No   Sexual activity: Not Currently  Other Topics Concern   Not on file  Social History Narrative   Per [place] Patient Packet, abstracted on 06/06/2020:      Diet: Keto Diet x 1 month, no red meat       Caffeine: Yes, coffee      Married, if yes what year: Yes, 1990 (last, 3rd)      Do you live in a house, apartment, assisted living, condo, trailer, ect: Hose      Is it one or more stories: 3 stories, 1 person       Pets: 1 Neurosurgeon       Current/Past profession: Gaffer, 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 No   Do you have difficulty preparing food or eating? No   Do you have difficulty managing your medications? No   Do you have difficulty managing your finances? No   Do you have difficulty affording  your medications? No   Social Determinants of Radio broadcast assistant Strain: Not on file  Food Insecurity: Not on file  Transportation Needs: Not on file  Physical Activity: Not on file  Stress: Not on file  Social Connections: Not on file  Intimate Partner Violence: Not on file   Current Outpatient Medications on File Prior to Visit  Medication Sig Dispense Refill   ascorbic acid (VITAMIN C) 500 MG tablet Take 500 mg by mouth daily.     atorvastatin (LIPITOR) 20 MG tablet Take 1 tablet (20 mg total) by mouth daily. 90 tablet 3   cholecalciferol (VITAMIN D) 1000 UNITS tablet Take 1,000 Units by mouth daily.     Cyanocobalamin (B-12) 3000 MCG SUBL Place 1 tablet under the tongue daily.     denosumab (PROLIA) 60 MG/ML SOSY injection Inject 60 mg into the skin every 6 (six) months.     famotidine (PEPCID) 40 MG tablet TAKE 1 TABLET(40 MG) BY MOUTH AT BEDTIME 90 tablet 0   [DISCONTINUED] Fluticasone-Salmeterol (ADVAIR) 100-50 MCG/DOSE AEPB Inhale 1 puff into the lungs 2 (two) times daily.     No current facility-administered medications on file prior to visit.   Allergies  Allergen Reactions   Penicillins Hives   Sulfa Antibiotics Hives and Nausea And Vomiting    7-15   Family History  Problem Relation Age of Onset   Skin cancer Mother    Pneumonia Mother        1/2 lung loss per [facility] New Patient Packet    Clotting disorder Father        PTE X 2   Skin cancer Father    Heart failure Father        Per [place] New Patient Packet    Allergies Brother    Asthma Brother    Heart attack Brother        > 80   Bladder Cancer Brother    Diabetes Daughter    Migraines Daughter    Heart attack Daughter    Breast cancer Daughter    Schizophrenia Son        Per [place] New Patient Packet    Lung disease Son        Per [place] New Patient Packet    Mental illness Son        suicide by hanging   Diabetes Son    Hepatitis C Son    Sudden death Other        2 M aunts & 2 M uncles in 60s    Stroke Neg Hx    PE: BP 110/68 (BP Location: Right Arm, Patient Position: Sitting, Cuff Size: Normal)    Pulse 71    Ht 5\' 4"  (1.626 m)    Wt 163 lb 12.8 oz (74.3 kg)    SpO2 (!) 88%    BMI 28.12 kg/m   Wt Readings from Last 3 Encounters:  02/12/22 163 lb 12.8 oz (74.3 kg)  01/30/22 167 lb (75.8 kg)  11/27/21 162 lb (73.5 kg)   Constitutional: normal weight, in NAD Eyes: PERRLA, EOMI, no exophthalmos ENT: moist mucous membranes, no thyromegaly, no cervical lymphadenopathy Cardiovascular: RRR, No MRG Respiratory: CTA B Musculoskeletal: no deformities, strength intact  in all 4 Skin: moist, warm, no rashes Neurological: + tremor with outstretched hands, DTR normal in all 4  ASSESSMENT: 1. Graves ds.  2.  Neck nodule  PLAN:  1. Patient with history of Graves' disease diagnosed in the past based on elevated TSI antibodies -She has been on methimazole in the past but she was able to start this in 02/2016 with subsequently normal TFTs. -At last visit, however, her TSH was slightly high, with normal free thyroid hormones.  I did not suggest intervention at that time, only follow-up. -Her latest TSH was normal on 11/27/2021, at 4.4. -we will repeat her TFTs at her appt with PCP in 1 week (I have no lab tech today) -She has mild chronic fatigue and hair loss and also chronic tremors. -Since last visit, she had ataxia, dysphagia and falls.  We discussed that these are not related to her hypothyroidism. -We did discuss about how to take levothyroxine correctly in case we need to start: Every day, with water, at least 30 minutes before breakfast, separated by at least 4 hours from: - acid reflux medications - calcium - iron - multivitamins -I will see her back in 1 year, but sooner for labs if needed  2.  Neck nodule -She has a history of a right inferior cervical lymph node on palpation -We did not check a thyroid ultrasound but I advised her to let me know if this continued to  enlarge. -This was not palpated at last visit anymore -Since then, she had a modified barium swallow study due to history of aspiration (11/29/2021): Prominent cricopharyngeus, no external compression on the esophagus  Orders Placed This Encounter  Procedures   TSH   T4, free   T3, free   Philemon Kingdom, MD PhD [facility] Endocrinology

## 2022-02-12 NOTE — Patient Instructions (Signed)
Please have thyroid labs drawn at the time of your appt. with Dr. Ronnald Ramp.  Please come back for a follow-up appointment in 1 year.

## 2022-02-20 ENCOUNTER — Encounter: Payer: Self-pay | Admitting: Internal Medicine

## 2022-02-20 ENCOUNTER — Ambulatory Visit (INDEPENDENT_AMBULATORY_CARE_PROVIDER_SITE_OTHER): Payer: Medicare Other | Admitting: Internal Medicine

## 2022-02-20 ENCOUNTER — Other Ambulatory Visit: Payer: Self-pay

## 2022-02-20 VITALS — BP 118/62 | HR 83 | Temp 98.3°F | Ht 64.0 in | Wt 163.0 lb

## 2022-02-20 DIAGNOSIS — R300 Dysuria: Secondary | ICD-10-CM

## 2022-02-20 DIAGNOSIS — R9089 Other abnormal findings on diagnostic imaging of central nervous system: Secondary | ICD-10-CM | POA: Diagnosis not present

## 2022-02-20 DIAGNOSIS — N3000 Acute cystitis without hematuria: Secondary | ICD-10-CM

## 2022-02-20 LAB — URINALYSIS, ROUTINE W REFLEX MICROSCOPIC
Bilirubin Urine: NEGATIVE
Ketones, ur: NEGATIVE
Nitrite: POSITIVE — AB
Specific Gravity, Urine: 1.025 (ref 1.000–1.030)
Total Protein, Urine: NEGATIVE
Urine Glucose: NEGATIVE
Urobilinogen, UA: 0.2 (ref 0.0–1.0)
pH: 5.5 (ref 5.0–8.0)

## 2022-02-20 MED ORDER — NITROFURANTOIN MONOHYD MACRO 100 MG PO CAPS: 100.0000 mg | ORAL_CAPSULE | Freq: Two times a day (BID) | ORAL | 0 refills | Status: AC

## 2022-02-20 NOTE — Progress Notes (Signed)
Subjective:  Patient ID: Faith Edwards, female    DOB: 12-Apr-1932  Age: 88 y.o. MRN: [id number]  CC: Follow-up and Urinary Tract Infection  This visit occurred during the SARS-CoV-2 public health emergency.  Safety protocols were in place, including screening questions prior to the visit, additional usage of staff PPE, and extensive cleaning of exam room while observing appropriate contact time as indicated for disinfecting solutions.    HPI Faith Edwards presents for f/up -  She complains of a 2 week hx of dysuria and urgency and to discuss her recent abnormal MRI.  Outpatient Medications Prior to Visit  Medication Sig Dispense Refill   ascorbic acid (VITAMIN C) 500 MG tablet Take 500 mg by mouth daily.     atorvastatin (LIPITOR) 20 MG tablet Take 1 tablet (20 mg total) by mouth daily. 90 tablet 3   cholecalciferol (VITAMIN D) 1000 UNITS tablet Take 1,000 Units by mouth daily.     Cyanocobalamin (B-12) 3000 MCG SUBL Place 1 tablet under the tongue daily.     denosumab (PROLIA) 60 MG/ML SOSY injection Inject 60 mg into the skin every 6 (six) months.     famotidine (PEPCID) 40 MG tablet TAKE 1 TABLET(40 MG) BY MOUTH AT BEDTIME 90 tablet 0   No facility-administered medications prior to visit.    ROS Review of Systems  Constitutional:  Negative for chills, fatigue and fever.  HENT: Negative.    Eyes: Negative.   Respiratory:  Negative for cough.   Cardiovascular:  Negative for chest pain.  Gastrointestinal:  Negative for abdominal pain, diarrhea, nausea and vomiting.  Endocrine: Negative.   Genitourinary:  Positive for dysuria and urgency. Negative for decreased urine volume, difficulty urinating, frequency, hematuria and pelvic pain.  Musculoskeletal: Negative.   Neurological: Negative.  Negative for dizziness, weakness and light-headedness.  Hematological:  Negative for adenopathy. Does not bruise/bleed easily.  Psychiatric/Behavioral: Negative.     Objective:  BP 118/62 (BP  Location: Left Arm, Patient Position: Sitting, Cuff Size: Large)    Pulse 83    Temp 98.3 F (36.8 C) (Oral)    Ht 5\' 4"  (1.626 m)    Wt 163 lb (73.9 kg)    SpO2 98%    BMI 27.98 kg/m   BP Readings from Last 3 Encounters:  02/20/22 118/62  02/12/22 110/68  01/30/22 128/76    Wt Readings from Last 3 Encounters:  02/20/22 163 lb (73.9 kg)  02/12/22 163 lb 12.8 oz (74.3 kg)  01/30/22 167 lb (75.8 kg)    Physical Exam Vitals reviewed.  Constitutional:      Appearance: Normal appearance.  HENT:     Nose: Nose normal.     Mouth/Throat:     Mouth: Mucous membranes are moist.  Eyes:     General: No scleral icterus.    Conjunctiva/sclera: Conjunctivae normal.  Cardiovascular:     Rate and Rhythm: Normal rate and regular rhythm.     Heart sounds: No murmur heard. Pulmonary:     Effort: Pulmonary effort is normal.     Breath sounds: No stridor. No wheezing, rhonchi or rales.  Abdominal:     General: Abdomen is flat.     Palpations: There is no mass.     Tenderness: There is no abdominal tenderness. There is no guarding.     Hernia: No hernia is present.  Musculoskeletal:        General: Normal range of motion.     Cervical back: Neck supple.  Lymphadenopathy:     Cervical: No cervical adenopathy.  Skin:    General: Skin is warm and dry.  Neurological:     General: No focal deficit present.     Mental Status: She is alert. Mental status is at baseline.    Lab Results  Component Value Date   WBC 6.8 10/03/2021   HGB 12.8 10/03/2021   HCT 39.1 10/03/2021   PLT 265 10/03/2021   GLUCOSE 93 11/27/2021   CHOL 173 11/27/2021   TRIG 84 11/27/2021   HDL 73 11/27/2021   LDLCALC 83 11/27/2021   ALT 25 09/27/2021   AST 27 09/27/2021   NA 139 11/27/2021   K 4.8 11/27/2021   CL 105 11/27/2021   CREATININE 0.99 (H) 11/27/2021   BUN 24 11/27/2021   CO2 27 11/27/2021   TSH 4.40 11/27/2021   INR 1.80 (H) 02/06/2010   HGBA1C 5.6 01/05/2021    MR Brain Wo Contrast  Result  Date: 01/31/2022 CLINICAL DATA:  Ataxia, hearing things, dizziness for 3 years EXAM: MRI HEAD WITHOUT CONTRAST TECHNIQUE: Multiplanar, multiecho pulse sequences of the brain and surrounding structures were obtained without intravenous contrast. COMPARISON:  CT head 07/26/2016, MR head 07/21/2012 FINDINGS: Brain: There is no evidence of acute intracranial hemorrhage, extra-axial fluid collection, or acute infarct. There is a background of mild global parenchymal volume loss with prominence of the ventricular system and extra-axial CSF spaces. There is extensive confluent FLAIR signal abnormality throughout the subcortical and periventricular white matter, nonspecific but likely reflecting sequela of advanced chronic white matter microangiopathy. This has progressed since 2013. There are scattered punctate chronic microhemorrhages in the left parietal and occipital lobes. There is no solid mass lesion.  There is no midline shift. Vascular: Normal flow voids. Skull and upper cervical spine: Normal marrow signal. Sinuses/Orbits: The paranasal sinuses are clear. The globes and orbits are unremarkable. Other: None. IMPRESSION: 1. No acute intracranial pathology. 2. Advanced chronic white matter microangiopathy, progressed since 2013. 3. Scattered punctate chronic microhemorrhage in the left parieto-occipital region. These are nonspecific and could reflect cerebral amyloid angiopathy or old emboli, though the focality would be atypical for these diagnoses. Electronically Signed   By: Valetta Mole M.D.   On: 01/31/2022 11:40    Assessment & Plan:   Faith Edwards was seen today for follow-up and urinary tract infection.  Diagnoses and all orders for this visit:  Dysuria -     Urinalysis, Routine w reflex microscopic; Future -     CULTURE, URINE COMPREHENSIVE; Future -     CULTURE, URINE COMPREHENSIVE -     Urinalysis, Routine w reflex microscopic  Abnormal brain MRI -     Ambulatory referral to Neurology  Acute  cystitis without hematuria -     nitrofurantoin, macrocrystal-monohydrate, (MACROBID) 100 MG capsule; Take 1 capsule (100 mg total) by mouth 2 (two) times daily for 5 days.   I am having Faith Edwards start on nitrofurantoin (macrocrystal-monohydrate). I am also having her maintain her cholecalciferol, B-12, ascorbic acid, denosumab, famotidine, and atorvastatin.  Meds ordered this encounter  Medications   nitrofurantoin, macrocrystal-monohydrate, (MACROBID) 100 MG capsule    Sig: Take 1 capsule (100 mg total) by mouth 2 (two) times daily for 5 days.    Dispense:  10 capsule    Refill:  0     Follow-up: No follow-ups on file.  Scarlette Calico, MD

## 2022-02-21 ENCOUNTER — Encounter: Payer: Self-pay | Admitting: Neurology

## 2022-02-22 LAB — CULTURE, URINE COMPREHENSIVE

## 2022-02-23 ENCOUNTER — Encounter: Payer: Self-pay | Admitting: Internal Medicine

## 2022-02-23 NOTE — Patient Instructions (Signed)
Urinary Tract Infection, Adult A urinary tract infection (UTI) is an infection of any part of the urinary tract. The urinary tract includes the kidneys, ureters, bladder, and urethra. These organs make, store, and get rid of urine in the body. An upper UTI affects the ureters and kidneys. A lower UTI affects the bladder and urethra. What are the causes? Most urinary tract infections are caused by bacteria in your genital area around your urethra, where urine leaves your body. These bacteria grow and cause inflammation of your urinary tract. What increases the risk? You are more likely to develop this condition if: You have a urinary catheter that stays in place. You are not able to control when you urinate or have a bowel movement (incontinence). You are female and you: Use a spermicide or diaphragm for birth control. Have low estrogen levels. Are pregnant. You have certain genes that increase your risk. You are sexually active. You take antibiotic medicines. You have a condition that causes your flow of urine to slow down, such as: An enlarged prostate, if you are female. Blockage in your urethra. A kidney stone. A nerve condition that affects your bladder control (neurogenic bladder). Not getting enough to drink, or not urinating often. You have certain medical conditions, such as: Diabetes. A weak disease-fighting system (immunesystem). Sickle cell disease. Gout. Spinal cord injury. What are the signs or symptoms? Symptoms of this condition include: Needing to urinate right away (urgency). Frequent urination. This may include small amounts of urine each time you urinate. Pain or burning with urination. Blood in the urine. Urine that smells bad or unusual. Trouble urinating. Cloudy urine. Vaginal discharge, if you are female. Pain in the abdomen or the lower back. You may also have: Vomiting or a decreased appetite. Confusion. Irritability or tiredness. A fever or  chills. Diarrhea. The first symptom in older adults may be confusion. In some cases, they may not have any symptoms until the infection has worsened. How is this diagnosed? This condition is diagnosed based on your medical history and a physical exam. You may also have other tests, including: Urine tests. Blood tests. Tests for STIs (sexually transmitted infections). If you have had more than one UTI, a cystoscopy or imaging studies may be done to determine the cause of the infections. How is this treated? Treatment for this condition includes: Antibiotic medicine. Over-the-counter medicines to treat discomfort. Drinking enough water to stay hydrated. If you have frequent infections or have other conditions such as a kidney stone, you may need to see a health care provider who specializes in the urinary tract (urologist). In rare cases, urinary tract infections can cause sepsis. Sepsis is a life-threatening condition that occurs when the body responds to an infection. Sepsis is treated in the hospital with IV antibiotics, fluids, and other medicines. Follow these instructions at home: Medicines Take over-the-counter and prescription medicines only as told by your health care provider. If you were prescribed an antibiotic medicine, take it as told by your health care provider. Do not stop using the antibiotic even if you start to feel better. General instructions Make sure you: Empty your bladder often and completely. Do not hold urine for long periods of time. Empty your bladder after sex. Wipe from front to back after urinating or having a bowel movement if you are female. Use each tissue only one time when you wipe. Drink enough fluid to keep your urine pale yellow. Keep all follow-up visits. This is important. Contact a health care provider   if: Your symptoms do not get better after 1-2 days. Your symptoms go away and then return. Get help right away if: You have severe pain in your  back or your lower abdomen. You have a fever or chills. You have nausea or vomiting. Summary A urinary tract infection (UTI) is an infection of any part of the urinary tract, which includes the kidneys, ureters, bladder, and urethra. Most urinary tract infections are caused by bacteria in your genital area. Treatment for this condition often includes antibiotic medicines. If you were prescribed an antibiotic medicine, take it as told by your health care provider. Do not stop using the antibiotic even if you start to feel better. Keep all follow-up visits. This is important. This information is not intended to replace advice given to you by your health care provider. Make sure you discuss any questions you have with your health care provider. Document Revised: 07/29/2020 Document Reviewed: 07/29/2020 Elsevier Patient Education  2022 Elsevier Inc.  

## 2022-03-08 ENCOUNTER — Other Ambulatory Visit: Payer: Self-pay | Admitting: Gastroenterology

## 2022-03-20 ENCOUNTER — Ambulatory Visit: Payer: Medicare Other | Admitting: Internal Medicine

## 2022-03-21 DIAGNOSIS — N3000 Acute cystitis without hematuria: Secondary | ICD-10-CM | POA: Diagnosis not present

## 2022-04-16 ENCOUNTER — Ambulatory Visit (INDEPENDENT_AMBULATORY_CARE_PROVIDER_SITE_OTHER): Payer: Medicare Other | Admitting: Gastroenterology

## 2022-04-16 ENCOUNTER — Encounter: Payer: Self-pay | Admitting: Gastroenterology

## 2022-04-16 ENCOUNTER — Other Ambulatory Visit (INDEPENDENT_AMBULATORY_CARE_PROVIDER_SITE_OTHER): Payer: Medicare Other

## 2022-04-16 VITALS — BP 120/70 | HR 88 | Ht 64.0 in | Wt 166.0 lb

## 2022-04-16 DIAGNOSIS — K219 Gastro-esophageal reflux disease without esophagitis: Secondary | ICD-10-CM | POA: Diagnosis not present

## 2022-04-16 DIAGNOSIS — K5900 Constipation, unspecified: Secondary | ICD-10-CM

## 2022-04-16 DIAGNOSIS — E039 Hypothyroidism, unspecified: Secondary | ICD-10-CM | POA: Diagnosis not present

## 2022-04-16 LAB — COMPREHENSIVE METABOLIC PANEL
ALT: 14 U/L (ref 0–35)
AST: 18 U/L (ref 0–37)
Albumin: 4.1 g/dL (ref 3.5–5.2)
Alkaline Phosphatase: 69 U/L (ref 39–117)
BUN: 29 mg/dL — ABNORMAL HIGH (ref 6–23)
CO2: 27 mEq/L (ref 19–32)
Calcium: 9.4 mg/dL (ref 8.4–10.5)
Chloride: 103 mEq/L (ref 96–112)
Creatinine, Ser: 1.17 mg/dL (ref 0.40–1.20)
GFR: 41.75 mL/min — ABNORMAL LOW (ref >60.00)
Glucose, Bld: 101 mg/dL — ABNORMAL HIGH (ref 70–99)
Potassium: 4.6 mEq/L (ref 3.5–5.1)
Sodium: 139 mEq/L (ref 135–145)
Total Bilirubin: 0.7 mg/dL (ref 0.2–1.2)
Total Protein: 7 g/dL (ref 6.0–8.3)

## 2022-04-16 LAB — CBC WITH DIFFERENTIAL/PLATELET
Basophils Absolute: 0 10*3/uL (ref 0.0–0.1)
Basophils Relative: 0.5 % (ref 0.0–3.0)
Eosinophils Absolute: 0.4 10*3/uL (ref 0.0–0.7)
Eosinophils Relative: 4.9 % (ref 0.0–5.0)
HCT: 37.6 % (ref 36.0–46.0)
Hemoglobin: 12.6 g/dL (ref 12.0–15.0)
Lymphocytes Relative: 26 % (ref 12.0–46.0)
Lymphs Abs: 2 10*3/uL (ref 0.7–4.0)
MCHC: 33.4 g/dL (ref 30.0–36.0)
MCV: 95 fl (ref 78.0–100.0)
Monocytes Absolute: 0.9 10*3/uL (ref 0.1–1.0)
Monocytes Relative: 11.7 % (ref 3.0–12.0)
Neutro Abs: 4.4 10*3/uL (ref 1.4–7.7)
Neutrophils Relative %: 56.9 % (ref 43.0–77.0)
Platelets: 243 10*3/uL (ref 150.0–400.0)
RBC: 3.95 Mil/uL (ref 3.87–5.11)
RDW: 13.1 % (ref 11.5–15.5)
WBC: 7.7 10*3/uL (ref 4.0–10.5)

## 2022-04-16 LAB — T3, FREE: T3, Free: 2.8 pg/mL (ref 2.3–4.2)

## 2022-04-16 LAB — T4, FREE: Free T4: 0.97 ng/dL (ref 0.60–1.60)

## 2022-04-16 LAB — TSH: TSH: 2.73 u[IU]/mL (ref 0.35–5.50)

## 2022-04-16 NOTE — Patient Instructions (Signed)
Start over the counter Miralax 1-2 x daily. ? ?Your provider has requested that you go to the basement level for lab work before leaving today. Press "B" on the elevator. The lab is located at the first door on the left as you exit the elevator. ? ?Follow the instructions on the Hemoccult cards and mail them back to [place] when you are finished or you may take them directly to the lab in the basement of the [place] building. We will call you with the results.  ? ? ?You can also take over the counter Gas-x as needed for gas and bloating. ? ?The [place] providers would like to encourage you to use [facility] to communicate with providers for non-urgent requests or questions.  Due to long hold times on the telephone, sending your provider a message by [facility] may be a faster and more efficient way to get a response.  Please allow 48 business hours for a response.  Please remember that this is for non-urgent requests.  ? ?Thank you for choosing me and [facility]. ? ?Malcolm T. Dagoberto Ligas., MD., [facility] ? ?

## 2022-04-16 NOTE — Progress Notes (Signed)
? ? ? ?  History of Present Illness: This is an 88 year old female complaining of constipation, gas and darker stools.  She is unaccompanied.  She relates ongoing difficulties with constipation and frequent pellet-like stools that are difficult to pass.  She takes MiraLAX intermittently but not every day.  She states she eats blueberries frequently and notes that her stools are occasionally dark.  She she does not have melena.  She denies hematochezia.  She notes frequent intestinal gas.  Her reflux symptoms are well controlled on famotidine 40 mg at bedtime.  She has some difficulty remembering details of her history and medications.  Her appetite is good and her weight is stable.  Colonoscopy and EGD performed in October 2019 showed mild left colon diverticulosis and a medium sized hiatal hernia.  They were otherwise normal. ? ?Current Medications, Allergies, Past Medical History, Past Surgical History, Family History and Social History were reviewed in [employer] record. ? ? ?Physical Exam: ?General: Well developed, well nourished, elderly, no acute distress ?Head: Normocephalic and atraumatic ?Eyes: Sclerae anicteric, EOMI ?Ears: Normal auditory acuity ?Mouth: Not examined, mask on during Covid-19 pandemic ?Lungs: Clear throughout to auscultation ?Heart: Regular rate and rhythm; no murmurs, rubs or bruits ?Abdomen: Soft, non tender and non distended. No masses, hepatosplenomegaly or hernias noted. Normal Bowel sounds ?Rectal: Not done  ?Musculoskeletal: Symmetrical with no gross deformities  ?Pulses:  Normal pulses noted ?Extremities: No clubbing, cyanosis, edema or deformities noted ?Neurological: Alert oriented x 4, grossly nonfocal ?Psychological:  Alert and cooperative. Normal mood and affect ? ? ?Assessment and Recommendations: ? ?Constipation, darker stools, gas. CBC, CMP, home Hemoccults.  Increase daily water intake and increase dietary fiber intake long-term.  Miralax qd and  increase to bid if constipation is not well controlled.  Her darker stools are likely related to her diet.  Gas-X tid prn.  Low gas diet.  If her CBC, CMP and Hemoccults do not indicate a need for further GI evaluation I recommend that she have ongoing follow up with [facility] and Scarlette Calico, MD.  ?GERD, medium sized hiatal hernia.  Follow antireflux measures long-term.  Continue famotidine 40 mg at bedtime. ?

## 2022-04-18 DIAGNOSIS — R300 Dysuria: Secondary | ICD-10-CM | POA: Diagnosis not present

## 2022-04-18 DIAGNOSIS — N3000 Acute cystitis without hematuria: Secondary | ICD-10-CM | POA: Diagnosis not present

## 2022-04-19 DIAGNOSIS — R300 Dysuria: Secondary | ICD-10-CM | POA: Diagnosis not present

## 2022-04-30 ENCOUNTER — Other Ambulatory Visit (INDEPENDENT_AMBULATORY_CARE_PROVIDER_SITE_OTHER): Payer: Medicare Other

## 2022-04-30 DIAGNOSIS — K5900 Constipation, unspecified: Secondary | ICD-10-CM | POA: Diagnosis not present

## 2022-04-30 LAB — HEMOCCULT SLIDES (X 3 CARDS)
Fecal Occult Blood: NEGATIVE
OCCULT 1: NEGATIVE
OCCULT 2: NEGATIVE
OCCULT 3: NEGATIVE
OCCULT 4: NEGATIVE
OCCULT 5: NEGATIVE

## 2022-05-04 DIAGNOSIS — F3181 Bipolar II disorder: Secondary | ICD-10-CM | POA: Diagnosis not present

## 2022-05-31 NOTE — Progress Notes (Deleted)
NEUROLOGY CONSULTATION NOTE  Faith Edwards MRN: [id number] DOB: [birth year]  Referring provider: Scarlette Calico, MD Primary care provider: Sherrie Mustache, NP  Reason for consult:  abnormal brain MRI  Assessment/Plan:   ***   Subjective:  Faith Edwards is an 88 year old female with hyperthyroidism and history of PEs who presents for abnormal brain MRI.  History supplemented by referring provider's  notes.  In January, she began experiencing dizziness with unsteady gait and ringing in her ears.  She has known sensorineural hearing loss but does not wear her hearing aids.  Onset correlated with abruptly stopping duloxetine.  She had an MRI of the brain on 01/31/2022 personally reviewed which showed advanced chronic small vessel ischemic changes with scattered punctate chronic microhemorrhages in the left parieto-occipital region.     PAST MEDICAL HISTORY: Past Medical History:  Diagnosis Date   Anxiety    Arthritis    Asthma    Bronchitis    hx of    Chronic headaches    [facility]   COVID-19    Cystitis    1954   Depression    Ear pain    bilat    Hiatal hernia    History of blood clots    Lungs, per [place] new patient packet    History of blood transfusion    2010   Hx of blood clots 2007 & 2010   post TKR   Hyperthyroidism    Pelvic fracture (HCC)    hx of in three places secondary to fall    Pneumonia    hx of 1952   Pulmonary embolism ([place]) 1993   on HRT   Right wrist fracture    hx of 1995 has plate secondary to fall    Shaking    hands bilat   Shingles     per [place] new patient packet    Shortness of breath dyspnea    Skin cancer    basal cell    Spinal stenosis    Trauma    left lower leg 10/22/2014    Urinary frequency    Urticaria     PAST SURGICAL HISTORY: Past Surgical History:  Procedure Laterality Date   ABDOMINAL HYSTERECTOMY  1969    per [facility] new patient packet    [place]    per [place] new patient packet    COLONOSCOPY     diverticulosis   ESI   11/2013    C7-T1 ; Dr Nelva Bush   EYE SURGERY     bilat cataract surgery    INSERTION OF MESH N/A 07/08/2015   Procedure: INSERTION OF MESH;  Surgeon: Armandina Gemma, MD;  Location: [facility];  Service: General;  Laterality: N/A;   REPLACEMENT TOTAL KNEE BILATERAL  2007 & 2010   SP FACET INJECTION  01/19/14   RC2-3,3-4,C4-5   TONSILLECTOMY  1945    per [facility] new patient packet    VENTRAL HERNIA REPAIR N/A 07/08/2015   Procedure: VENTRAL ADULT REPAIR VENTRAL INCISIONAL HERNIA REPAIR;  Surgeon: Armandina Gemma, MD;  Location: [facility];  Service: General;  Laterality: N/A;   WRIST FRACTURE SURGERY Bilateral 10  years and 2 years/   WRIST SURGERY Left 06/2016   plate     MEDICATIONS: Current Outpatient Medications on File Prior to Visit  Medication Sig Dispense Refill   ascorbic acid (VITAMIN C) 500 MG tablet Take 500 mg  by mouth daily.     atorvastatin (LIPITOR) 20 MG tablet Take 1 tablet (20 mg total) by mouth daily. 90 tablet 3   cholecalciferol (VITAMIN D) 1000 UNITS tablet Take 1,000 Units by mouth daily.     Cyanocobalamin (B-12) 3000 MCG SUBL Place 1 tablet under the tongue daily.     denosumab (PROLIA) 60 MG/ML SOSY injection Inject 60 mg into the skin every 6 (six) months.     DULoxetine (CYMBALTA) 30 MG capsule Take 30 mg by mouth daily.     famotidine (PEPCID) 40 MG tablet TAKE 1 TABLET(40 MG) BY MOUTH AT BEDTIME 90 tablet 0   [DISCONTINUED] Fluticasone-Salmeterol (ADVAIR) 100-50 MCG/DOSE AEPB Inhale 1 puff into the lungs 2 (two) times daily.     No current facility-administered medications on file prior to visit.    ALLERGIES: Allergies  Allergen Reactions   Penicillins Hives   Sulfa Antibiotics Hives and Nausea And Vomiting    7-15    FAMILY HISTORY: Family History  Problem Relation Age of Onset   Skin cancer Mother    Pneumonia Mother        1/2 lung loss per [facility] New Patient Packet    Clotting disorder  Father        PTE X 2   Skin cancer Father    Heart failure Father        Per [place] New Patient Packet    Allergies Brother    Asthma Brother    Heart attack Brother        > 52   Bladder Cancer Brother    Diabetes Daughter    Migraines Daughter    Heart attack Daughter    Breast cancer Daughter    Schizophrenia Son        Per [facility] New Patient Packet    Lung disease Son        Per [place] New Patient Packet    Mental illness Son        suicide by hanging   Diabetes Son    Hepatitis C Son    Sudden death Other        2 M aunts & 2 M uncles in 40s   Stroke Neg Hx     Objective:  *** General: No acute distress.  Patient appears well-groomed.   Head:  Normocephalic/atraumatic Eyes:  fundi examined but not visualized Neck: supple, no paraspinal tenderness, full range of motion Back: No paraspinal tenderness Heart: regular rate and rhythm Lungs: Clear to auscultation bilaterally. Vascular: No carotid bruits. Neurological Exam: Mental status: alert and oriented to person, place, and time, recent and remote memory intact, fund of knowledge intact, attention and concentration intact, speech fluent and not dysarthric, language intact. Cranial nerves: CN I: not tested CN II: pupils equal, round and reactive to light, visual fields intact CN III, IV, VI:  full range of motion, no nystagmus, no ptosis CN V: facial sensation intact. CN VII: upper and lower face symmetric CN VIII: hearing intact CN IX, X: gag intact, uvula midline CN XI: sternocleidomastoid and trapezius muscles intact CN XII: tongue midline Bulk & Tone: normal, no fasciculations. Motor:  muscle strength 5/5 throughout Sensation:  Pinprick, temperature and vibratory sensation intact. Deep Tendon Reflexes:  2+ throughout,  toes downgoing.   Finger to nose testing:  Without dysmetria.   Heel to shin:  Without dysmetria.   Gait:  Normal station and stride.  Romberg negative.    Thank you for allowing me to  take part  in the care of this patient.  Metta Clines, DO  CC:  Scarlette Calico, MD  Sherrie Mustache, NP

## 2022-06-01 ENCOUNTER — Ambulatory Visit: Payer: Medicare Other | Admitting: Neurology

## 2022-06-01 ENCOUNTER — Encounter: Payer: Self-pay | Admitting: Nurse Practitioner

## 2022-06-01 ENCOUNTER — Ambulatory Visit (INDEPENDENT_AMBULATORY_CARE_PROVIDER_SITE_OTHER): Payer: Medicare Other | Admitting: Nurse Practitioner

## 2022-06-01 VITALS — BP 122/76 | HR 90 | Temp 97.4°F | Ht 64.0 in | Wt 169.0 lb

## 2022-06-01 DIAGNOSIS — M810 Age-related osteoporosis without current pathological fracture: Secondary | ICD-10-CM | POA: Diagnosis not present

## 2022-06-01 DIAGNOSIS — E7849 Other hyperlipidemia: Secondary | ICD-10-CM

## 2022-06-01 DIAGNOSIS — K5904 Chronic idiopathic constipation: Secondary | ICD-10-CM

## 2022-06-01 DIAGNOSIS — R413 Other amnesia: Secondary | ICD-10-CM

## 2022-06-01 DIAGNOSIS — R9089 Other abnormal findings on diagnostic imaging of central nervous system: Secondary | ICD-10-CM

## 2022-06-01 DIAGNOSIS — L84 Corns and callosities: Secondary | ICD-10-CM

## 2022-06-01 MED ORDER — DENOSUMAB 60 MG/ML ~~LOC~~ SOSY
60.0000 mg | PREFILLED_SYRINGE | Freq: Once | SUBCUTANEOUS | Status: AC
  Administered 2022-06-01: 60 mg via SUBCUTANEOUS

## 2022-06-01 NOTE — Progress Notes (Signed)
Careteam: Patient Care Team: Lauree Chandler, NP as PCP - General (Geriatric Medicine) Tanda Rockers, MD as Consulting Physician (Pulmonary Disease) Marica Otter, [place] (Optometry) Norma Fredrickson, MD as Consulting Physician (Psychiatry) Philemon Kingdom, MD as Consulting Physician (Internal Medicine) Leatha Gilding, MD as Referring Physician (Orthopedic Surgery) Harold Hedge, Darrick Grinder, MD as Consulting Physician (Allergy and Immunology) Ladene Artist, MD as Consulting Physician (Gastroenterology) Pedro Earls, MD as Attending Physician (Family Medicine) Melida Quitter, MD as Consulting Physician (Otolaryngology)  PLACE OF SERVICE:  [place] Directive information Does Patient Have a Medical Advance Directive?: No, Would patient like information on creating a medical advance directive?: No - Patient declined  Allergies  Allergen Reactions   Penicillins Hives   Sulfa Antibiotics Hives and Nausea And Vomiting    7-15    Chief Complaint  Patient presents with   Medical Management of Chronic Issues    6 month follow-up and prolia injection. Discuss need for additional covid boosters or post pone if patient refuses. NCIR verified. Patient denies receiving any vaccines since last visit. Patient is no longer taking Pepcid or Advair.      HPI: Patient is a 88 y.o. female for routine follow up.  Plans to come back to [facility] for routine care.   MRI of the brain completed in feb 2023- she was supposed to see neurologist today.  She cancelled and does not know if she was to reschedule. Does not want further work up.  She sees a psychiatrist and she is aware she is having memory issues. Works on games on the computer. She reports she goes with her husband who is a Arboriculturist and she is keeping busy and active.   She reports gait has improved, she walks 1 mile daily and sometimes more.    She is taking cymbalta now- stopped abruptly but has restarted and doing well.    Continues on lipitor for cholesterol.  Review of Systems:  Review of Systems  Constitutional:  Negative for chills, fever and weight loss.  HENT:  Negative for tinnitus.   Respiratory:  Negative for cough, sputum production and shortness of breath.   Cardiovascular:  Negative for chest pain, palpitations and leg swelling.  Gastrointestinal:  Positive for constipation. Negative for abdominal pain, diarrhea and heartburn.  Genitourinary:  Negative for dysuria, frequency and urgency.  Musculoskeletal:  Negative for back pain, falls, joint pain and myalgias.  Skin: Negative.   Neurological:  Negative for dizziness and headaches.  Psychiatric/Behavioral:  Positive for memory loss. Negative for depression. The patient is not nervous/anxious and does not have insomnia.    Past Medical History:  Diagnosis Date   Anxiety    Arthritis    Asthma    Bronchitis    hx of    Chronic headaches    [facility]   COVID-19    Cystitis    1954   Depression    Ear pain    bilat    Hiatal hernia    History of blood clots    Lungs, per [place] new patient packet    History of blood transfusion    2010   Hx of blood clots 2007 & 2010   post TKR   Hyperthyroidism    Pelvic fracture (HCC)    hx of in three places secondary to fall    Pneumonia    hx of 1952   Pulmonary embolism ([place]) 1993   on HRT   Right  wrist fracture    hx of 1995 has plate secondary to fall    Shaking    hands bilat   Shingles     per [facility] new patient packet    Shortness of breath dyspnea    Skin cancer    basal cell    Spinal stenosis    Trauma    left lower leg 10/22/2014    Urinary frequency    Urticaria    Past Surgical History:  Procedure Laterality Date   ABDOMINAL HYSTERECTOMY  1969    per [facility] new patient packet    [place]    per [facility] new patient packet    COLONOSCOPY     diverticulosis   ESI   11/2013    C7-T1 ; Dr Nelva Bush   EYE  SURGERY     bilat cataract surgery    INSERTION OF MESH N/A 07/08/2015   Procedure: INSERTION OF MESH;  Surgeon: Armandina Gemma, MD;  [facility];  Service: General;  Laterality: N/A;   REPLACEMENT TOTAL KNEE BILATERAL  2007 & 2010   SP FACET INJECTION  01/19/14   RC2-3,3-4,C4-5   TONSILLECTOMY  1945    per [facility] new patient packet    VENTRAL HERNIA REPAIR N/A 07/08/2015   Procedure: VENTRAL ADULT REPAIR VENTRAL INCISIONAL HERNIA REPAIR;  Surgeon: Armandina Gemma, MD;  [facility];  Service: General;  Laterality: N/A;   WRIST FRACTURE SURGERY Bilateral 10  years and 2 years/   WRIST SURGERY Left 06/2016   plate    Social History:   reports that she has never smoked. She has never used smokeless tobacco. She reports current alcohol use of about 4.0 standard drinks per week. She reports that she does not use drugs.  Family History  Problem Relation Age of Onset   Skin cancer Mother    Pneumonia Mother        1/2 lung loss per [facility] New Patient Packet    Clotting disorder Father        PTE X 2   Skin cancer Father    Heart failure Father        Per [place] New Patient Packet    Allergies Brother    Asthma Brother    Heart attack Brother        > 21   Bladder Cancer Brother    Diabetes Daughter    Migraines Daughter    Heart attack Daughter    Breast cancer Daughter    Schizophrenia Son        Per [place] New Patient Packet    Lung disease Son        Per [place] New Patient Packet    Mental illness Son        suicide by hanging   Diabetes Son    Hepatitis C Son    Sudden death Other        2 M aunts & 2 M uncles in 24s   Stroke Neg Hx     Medications: Patient's Medications  New Prescriptions   No medications on file  Previous Medications   ASCORBIC ACID (VITAMIN C) 500 MG TABLET    Take 500 mg by mouth daily.   ATORVASTATIN (LIPITOR) 20 MG TABLET    Take 1 tablet (20 mg total) by mouth daily.   CHOLECALCIFEROL (VITAMIN D) 1000 UNITS TABLET    Take 1,000 Units by mouth  daily.    CYANOCOBALAMIN (B-12) 3000 MCG SUBL    Place 1 tablet under the tongue daily.   DENOSUMAB (PROLIA) 60 MG/ML SOSY INJECTION    Inject 60 mg into the skin every 6 (six) months.   DULOXETINE (CYMBALTA) 30 MG CAPSULE    Take 30 mg by mouth daily.  Modified Medications   No medications on file  Discontinued Medications   FAMOTIDINE (PEPCID) 40 MG TABLET    TAKE 1 TABLET(40 MG) BY MOUTH AT BEDTIME    Physical Exam:  Vitals:   06/01/22 1020  BP: 122/76  Pulse: 90  Temp: (!) 97.4 F (36.3 C)  TempSrc: Temporal  SpO2: 96%  Weight: 169 lb (76.7 kg)  Height: '5\' 4"'$  (1.626 m)   Body mass index is 29.01 kg/m. Wt Readings from Last 3 Encounters:  06/01/22 169 lb (76.7 kg)  04/16/22 166 lb (75.3 kg)  02/20/22 163 lb (73.9 kg)    Physical Exam Constitutional:      General: She is not in acute distress.    Appearance: She is well-developed. She is not diaphoretic.  HENT:     Head: Normocephalic and atraumatic.     Mouth/Throat:     Pharynx: No oropharyngeal exudate.  Eyes:     Conjunctiva/sclera: Conjunctivae normal.     Pupils: Pupils are equal, round, and reactive to light.  Cardiovascular:     Rate and Rhythm: Normal rate and regular rhythm.     Heart sounds: Normal heart sounds.  Pulmonary:     Effort: Pulmonary effort is normal.     Breath sounds: Normal breath sounds.  Abdominal:     General: Bowel sounds are normal.     Palpations: Abdomen is soft.  Musculoskeletal:     Cervical back: Normal range of motion and neck supple.     Right lower leg: No edema.     Left lower leg: No edema.  Feet:     Right foot:     Skin integrity: Callus (heel) present.  Skin:    General: Skin is warm and dry.  Neurological:     Mental Status: She is alert.  Psychiatric:        Mood and Affect: Mood normal.    Labs reviewed: Basic Metabolic Panel: Recent Labs    10/03/21 1119 11/27/21 1050 04/16/22 1541  NA 136 139 139  K 4.8 4.8 4.6  CL 105 105 103  CO2 '25 27 27  '$ GLUCOSE  120* 93 101*  BUN 29* 24 29*  CREATININE 1.13* 0.99* 1.17  CALCIUM 9.0 9.3 9.4  TSH  --  4.40 2.73   Liver Function Tests: Recent Labs    09/27/21 1244 04/16/22 1541  AST 27 18  ALT 25 14  ALKPHOS 61 69  BILITOT 0.9 0.7  PROT 7.0 7.0  ALBUMIN 4.1 4.1   No results for input(s): LIPASE, AMYLASE in the last 8760 hours. No results for input(s): AMMONIA in the last 8760 hours. CBC: Recent Labs    09/27/21 1244 10/03/21 1119 04/16/22 1541  WBC 7.4 6.8 7.7  NEUTROABS 4.8 4.3 4.4  HGB 12.5 12.8 12.6  HCT 38.3 39.1 37.6  MCV 97.0 98.5 95.0  PLT 275 265 243.0   Lipid Panel: Recent Labs    11/27/21 1050  CHOL 173  HDL 73  LDLCALC 83  TRIG 84  CHOLHDL 2.4   TSH: Recent Labs    11/27/21 1050 04/16/22 1541  TSH 4.40 2.73   A1C: Lab Results  Component Value Date   HGBA1C 5.6 01/05/2021   MR Brain Wo Contrast  Result Date: 01/31/2022 CLINICAL DATA:  Ataxia, hearing things, dizziness for 3 years EXAM: MRI HEAD WITHOUT CONTRAST TECHNIQUE: Multiplanar, multiecho pulse sequences of the brain and surrounding structures were obtained without intravenous contrast. COMPARISON:  CT head 07/26/2016, MR head 07/21/2012 FINDINGS: Brain: There is no evidence of acute intracranial hemorrhage, extra-axial fluid collection, or acute infarct. There is a background of mild global parenchymal volume loss with prominence of the ventricular system and extra-axial CSF spaces. There is extensive confluent FLAIR signal abnormality throughout the subcortical and periventricular white matter, nonspecific but likely reflecting sequela of advanced chronic white matter microangiopathy. This has progressed since 2013. There are scattered punctate chronic microhemorrhages in the left parietal and occipital lobes. There is no solid mass lesion.  There is no midline shift. Vascular: Normal flow voids. Skull and upper cervical spine: Normal marrow signal. Sinuses/Orbits: The paranasal sinuses are clear. The  globes and orbits are unremarkable. Other: None. IMPRESSION: 1. No acute intracranial pathology. 2. Advanced chronic white matter microangiopathy, progressed since 2013. 3. Scattered punctate chronic microhemorrhage in the left parieto-occipital region. These are nonspecific and could reflect cerebral amyloid angiopathy or old emboli, though the focality would be atypical for these diagnoses. Electronically Signed   By: Valetta Mole M.D.   On: 01/31/2022 11:40     Assessment/Plan 1. Age-related osteoporosis without current pathological fracture -continue to take calcium 600 mg twice daily with Vitamin D 2000 units daily and weight bearing activity 30 mins/5 days a week - denosumab (PROLIA) injection 60 mg  2. Abnormal brain MRI -pt noted to have ataxia in January and Brain MRI obtained with abnormal results. She does not wish to be seen by neurologist at this time.   3. Other hyperlipidemia Continues on lipitor daily   4. Memory loss Chronic white matter diseased noted on MRI. She is aware she has memory loss, staying active and challenging brain daily.   5. Chronic idiopathic constipation -controlled with OTC  6. Callus of heel -can make follow up appt for sharp debridement to remove.   7. Cerumen impaction Unable to tolerate lavage. Can use debrox and follow up in office for flush.     Return in about 6 months (around 12/01/2022) for routine follow up . Sooner if needed [employer]. [place], [facility] [phone]

## 2022-08-02 ENCOUNTER — Telehealth: Payer: Self-pay | Admitting: Nurse Practitioner

## 2022-08-02 NOTE — Telephone Encounter (Signed)
AWV schedued.

## 2022-08-02 NOTE — Telephone Encounter (Signed)
Pt had AWV scheduled on Dec 1st 2022 but this was not completed. Called to schedule as she is due but did not answer. Voicemail was left to call office to schedule AWV. Thank you

## 2022-08-13 ENCOUNTER — Ambulatory Visit (INDEPENDENT_AMBULATORY_CARE_PROVIDER_SITE_OTHER): Payer: Medicare Other | Admitting: Nurse Practitioner

## 2022-08-13 ENCOUNTER — Encounter: Payer: Self-pay | Admitting: Nurse Practitioner

## 2022-08-13 VITALS — BP 116/72 | HR 80 | Temp 97.6°F | Ht 65.03 in | Wt 166.0 lb

## 2022-08-13 DIAGNOSIS — L299 Pruritus, unspecified: Secondary | ICD-10-CM | POA: Diagnosis not present

## 2022-08-13 DIAGNOSIS — Z889 Allergy status to unspecified drugs, medicaments and biological substances status: Secondary | ICD-10-CM

## 2022-08-13 NOTE — Progress Notes (Signed)
Careteam: Patient Care Team: Lauree Chandler, NP as PCP - General (Geriatric Medicine) Tanda Rockers, MD as Consulting Physician (Pulmonary Disease) Marica Otter, [place] (Optometry) Norma Fredrickson, MD as Consulting Physician (Psychiatry) Philemon Kingdom, MD as Consulting Physician (Internal Medicine) Leatha Gilding, MD as Referring Physician (Orthopedic Surgery) Harold Hedge, Darrick Grinder, MD as Consulting Physician (Allergy and Immunology) Ladene Artist, MD as Consulting Physician (Gastroenterology) Pedro Earls, MD as Attending Physician (Family Medicine) Melida Quitter, MD as Consulting Physician (Otolaryngology)  PLACE OF SERVICE:  [place] Directive information Does Patient Have a Medical Advance Directive?: No, Would patient like information on creating a medical advance directive?: No - Patient declined  Allergies  Allergen Reactions   Penicillins Hives   Sulfa Antibiotics Hives and Nausea And Vomiting    7-15    Chief Complaint  Patient presents with   Acute Visit    Hives, interrupting sleep cycle. Patient would like a referral to a dermatologist.      HPI: Patient is a 87 y.o. female due to itching. Reports she does not have any lesions or rash.  Reports she has been itching on her back for years.  She reports she is itching on her leg and could not sleep.  Reports she drinks plenty of water.  She went to her urologist recently and he had recommended 6 glasses of water a day.  Reports she has dry skin and puts cream on after bath.  She bathes every other day.   Reports she is allergic to "everything" she will cough and sneeze. Wants to see an allergist.   Review of Systems:  Review of Systems  Constitutional:  Negative for chills, fever and malaise/fatigue.  Skin:  Positive for itching. Negative for rash.  Endo/Heme/Allergies:  Positive for environmental allergies.    Past Medical History:  Diagnosis Date   Anxiety    Arthritis     Asthma    Bronchitis    hx of    Chronic [facility]    [facility]   COVID-19    Cystitis    1954   Depression    Ear pain    bilat    Hiatal hernia    History of blood clots    Lungs, per [place] new patient packet    History of blood transfusion    2010   Hx of blood clots 2007 & 2010   post TKR   Hyperthyroidism    Pelvic fracture (HCC)    hx of in three places secondary to fall    Pneumonia    hx of 1952   Pulmonary embolism ([place]) 1993   on HRT   Right wrist fracture    hx of 1995 has plate secondary to fall    Shaking    hands bilat   Shingles     per [place] new patient packet    Shortness of breath dyspnea    Skin cancer    basal cell    Spinal stenosis    Trauma    left lower leg 10/22/2014    Urinary frequency    Urticaria    Past Surgical History:  Procedure Laterality Date   ABDOMINAL HYSTERECTOMY  1969    per [facility] new patient packet    [place]    per [place] new patient packet    COLONOSCOPY     diverticulosis  ESI   11/2013    C7-T1 ; Dr Nelva Bush   EYE SURGERY     bilat cataract surgery    INSERTION OF MESH N/A 07/08/2015   Procedure: INSERTION OF MESH;  Surgeon: Armandina Gemma, MD;  Location: [facility];  Service: General;  Laterality: N/A;   REPLACEMENT TOTAL KNEE BILATERAL  2007 & 2010   SP FACET INJECTION  01/19/14   RC2-3,3-4,C4-5   TONSILLECTOMY  1945    per [facility] new patient packet    VENTRAL HERNIA REPAIR N/A 07/08/2015   Procedure: VENTRAL ADULT REPAIR VENTRAL INCISIONAL HERNIA REPAIR;  Surgeon: Armandina Gemma, MD;  Location: [facility];  Service: General;  Laterality: N/A;   WRIST FRACTURE SURGERY Bilateral 10  years and 2 years/   WRIST SURGERY Left 06/2016   plate    Social History:   reports that she has never smoked. She has never used smokeless tobacco. She reports current alcohol use of about 4.0 standard drinks of alcohol per week. She reports that she does not use drugs.  Family  History  Problem Relation Age of Onset   Skin cancer Mother    Pneumonia Mother        1/2 lung loss per [facility] New Patient Packet    Clotting disorder Father        PTE X 2   Skin cancer Father    Heart failure Father        Per [place] New Patient Packet    Allergies Brother    Asthma Brother    Heart attack Brother        > 72   Bladder Cancer Brother    Diabetes Daughter    Migraines Daughter    Heart attack Daughter    Breast cancer Daughter    Schizophrenia Son        Per [place] New Patient Packet    Lung disease Son        Per [place] New Patient Packet    Mental illness Son        suicide by hanging   Diabetes Son    Hepatitis C Son    Sudden death Other        2 M aunts & 2 M uncles in 53s   Stroke Neg Hx     Medications: Patient's Medications  New Prescriptions   No medications on file  Previous Medications   ASCORBIC ACID (VITAMIN C) 500 MG TABLET    Take 500 mg by mouth daily.   ATORVASTATIN (LIPITOR) 20 MG TABLET    Take 1 tablet (20 mg total) by mouth daily.   CHOLECALCIFEROL (VITAMIN D) 1000 UNITS TABLET    Take 1,000 Units by mouth daily.   CYANOCOBALAMIN (B-12) 3000 MCG SUBL    Place 1 tablet under the tongue daily.   DENOSUMAB (PROLIA) 60 MG/ML SOSY INJECTION    Inject 60 mg into the skin every 6 (six) months.   DULOXETINE (CYMBALTA) 30 MG CAPSULE    Take 30 mg by mouth daily.  Modified Medications   No medications on file  Discontinued Medications   No medications on file    Physical Exam:  Vitals:   08/13/22 1404  BP: 116/72  Pulse: 80  Temp: 97.6 F (36.4 C)  TempSrc: Temporal  SpO2: 97%  Weight: 166 lb (75.3 kg)  Height: 5' 5.03" (1.652 m)   Body mass index is 27.6 kg/m. Wt Readings from Last 3 Encounters:  08/13/22 166 lb (75.3 kg)  06/01/22 169 lb (76.7 kg)  04/16/22 166 lb (75.3 kg)    Physical Exam Constitutional:      Appearance: Normal appearance.  Pulmonary:     Effort: Pulmonary effort is normal.  Skin:    General: Skin is  warm and dry.     Findings: No bruising, erythema, lesion or rash.  Neurological:     Mental Status: She is alert. Mental status is at baseline.  Psychiatric:        Mood and Affect: Mood normal.     Labs reviewed: Basic Metabolic Panel: Recent Labs    10/03/21 1119 11/27/21 1050 04/16/22 1541  NA 136 139 139  K 4.8 4.8 4.6  CL 105 105 103  CO2 '25 27 27  '$ GLUCOSE 120* 93 101*  BUN 29* 24 29*  CREATININE 1.13* 0.99* 1.17  CALCIUM 9.0 9.3 9.4  TSH  --  4.40 2.73   Liver Function Tests: Recent Labs    09/27/21 1244 04/16/22 1541  AST 27 18  ALT 25 14  ALKPHOS 61 69  BILITOT 0.9 0.7  PROT 7.0 7.0  ALBUMIN 4.1 4.1   No results for input(s): "LIPASE", "AMYLASE" in the last 8760 hours. No results for input(s): "AMMONIA" in the last 8760 hours. CBC: Recent Labs    09/27/21 1244 10/03/21 1119 04/16/22 1541  WBC 7.4 6.8 7.7  NEUTROABS 4.8 4.3 4.4  HGB 12.5 12.8 12.6  HCT 38.3 39.1 37.6  MCV 97.0 98.5 95.0  PLT 275 265 243.0   Lipid Panel: Recent Labs    11/27/21 1050  CHOL 173  HDL 73  LDLCALC 83  TRIG 84  CHOLHDL 2.4   TSH: Recent Labs    11/27/21 1050 04/16/22 1541  TSH 4.40 2.73   A1C: Lab Results  Component Value Date   HGBA1C 5.6 01/05/2021     Assessment/Plan 1. Multiple allergies -reports she has a lot of coughing, sneezing when around a lot of environmental allergies, also with itching. Requesting referral to allergist for evaluation.  - Ambulatory referral to Allergy  2. Pruritus - Use a mild, unscented soap.   - Bathe every other day if possible.  - After the bath apply a good lotion (unscented).recommendations include- Aveeno eczema, Aquafor, Eucerin. -make sure to stay hydrated.  -to start zyrtec 10 mg daily at bedtime, can use generic.  - Ambulatory referral to Allergy   Khalee Mazo K. [place], [facility] [phone]

## 2022-08-13 NOTE — Patient Instructions (Signed)
Start zyrtec 10 mg by mouth at bedtime.   Use a mild, unscented soap- (only on smelly areas)  You can use what you like (Dove, Aveeno, etc). Make sure to rinse well without HOT water - Bathe every other day if possible.  - After the bath apply a good lotion (unscented).   -recommendations include- Aveeno eczema, Aquafor, Eucerin. -make sure to stay hydrated.

## 2022-08-14 ENCOUNTER — Encounter: Payer: Self-pay | Admitting: Nurse Practitioner

## 2022-08-14 ENCOUNTER — Encounter: Payer: Medicare Other | Admitting: Nurse Practitioner

## 2022-08-14 NOTE — Progress Notes (Unsigned)
   This service is provided via telemedicine  No vital signs collected/recorded due to the encounter was a telemedicine visit.   Location of patient (ex: home, work):  Home  Patient consents to a telephone visit: Yes, see telephone visit dated 08/14/22  Location of the provider (ex: office, home):  [facility], Office   Name of any referring provider:  N/A  Names of all persons participating in the telemedicine service and their role in the encounter:  S.Chrae B/CMA, Sherrie Mustache, NP, and Patient   Time spent on call:  6 min with medical assistant

## 2022-08-24 ENCOUNTER — Encounter: Payer: Self-pay | Admitting: Nurse Practitioner

## 2022-08-24 ENCOUNTER — Ambulatory Visit (INDEPENDENT_AMBULATORY_CARE_PROVIDER_SITE_OTHER): Payer: Medicare Other | Admitting: Nurse Practitioner

## 2022-08-24 VITALS — BP 128/71 | HR 93 | Temp 96.9°F | Ht 65.0 in | Wt 165.0 lb

## 2022-08-24 DIAGNOSIS — Z0000 Encounter for general adult medical examination without abnormal findings: Secondary | ICD-10-CM | POA: Diagnosis not present

## 2022-08-24 NOTE — Patient Instructions (Signed)
Faith Edwards , Thank you for taking time to come for your Medicare Wellness Visit. I appreciate your ongoing commitment to your health goals. Please review the following plan we discussed and let me know if I can assist you in the future.   Screening recommendations/referrals: Colonoscopy aged out Mammogram aged out Bone Density due [phone] Recommended yearly ophthalmology/optometry visit for glaucoma screening and checkup Recommended yearly dental visit for hygiene and checkup  Vaccinations: Influenza vaccine- due annually in September/October Pneumococcal vaccine up to date Tdap vaccine up todate Shingles vaccine up to date    Advanced directives: recommended to complete and bring back to office to place on file.   Conditions/risks identified: advance age.   Next appointment: yearly for awv   Preventive Care 39 Years and Older, Female Preventive care refers to lifestyle choices and visits with your health care provider that can promote health and wellness. What does preventive care include? A yearly physical exam. This is also called an annual well check. Dental exams once or twice a year. Routine eye exams. Ask your health care provider how often you should have your eyes checked. Personal lifestyle choices, including: Daily care of your teeth and gums. Regular physical activity. Eating a healthy diet. Avoiding tobacco and drug use. Limiting alcohol use. Practicing safe sex. Taking low-dose aspirin every day. Taking vitamin and mineral supplements as recommended by your health care provider. What happens during an annual well check? The services and screenings done by your health care provider during your annual well check will depend on your age, overall health, lifestyle risk factors, and family history of disease. Counseling  Your health care provider may ask you questions about your: Alcohol use. Tobacco use. Drug use. Emotional well-being. Home and relationship  well-being. Sexual activity. Eating habits. History of falls. Memory and ability to understand (cognition). Work and work Statistician. Reproductive health. Screening  You may have the following tests or measurements: Height, weight, and BMI. Blood pressure. Lipid and cholesterol levels. These may be checked every 5 years, or more frequently if you are over 13 years old. Skin check. Lung cancer screening. You may have this screening every year starting at age 63 if you have a 30-pack-year history of smoking and currently smoke or have quit within the past 15 years. Fecal occult blood test (FOBT) of the stool. You may have this test every year starting at age 31. Flexible sigmoidoscopy or colonoscopy. You may have a sigmoidoscopy every 5 years or a colonoscopy every 10 years starting at age 44. Hepatitis C blood test. Hepatitis B blood test. Sexually transmitted disease (STD) testing. Diabetes screening. This is done by checking your blood sugar (glucose) after you have not eaten for a while (fasting). You may have this done every 1-3 years. Bone density scan. This is done to screen for osteoporosis. You may have this done starting at age 10. Mammogram. This may be done every 1-2 years. Talk to your health care provider about how often you should have regular mammograms. Talk with your health care provider about your test results, treatment options, and if necessary, the need for more tests. Vaccines  Your health care provider may recommend certain vaccines, such as: Influenza vaccine. This is recommended every year. Tetanus, diphtheria, and acellular pertussis (Tdap, Td) vaccine. You may need a Td booster every 10 years. Zoster vaccine. You may need this after age 47. Pneumococcal 13-valent conjugate (PCV13) vaccine. One dose is recommended after age 45. Pneumococcal polysaccharide (PPSV23) vaccine. One dose is  recommended after age 77. Talk to your health care provider about which  screenings and vaccines you need and how often you need them. This information is not intended to replace advice given to you by your health care provider. Make sure you discuss any questions you have with your health care provider. Document Released: 01/13/2016 Document Revised: 09/05/2016 Document Reviewed: 10/18/2015 Elsevier Interactive Patient Education  2017 [place] Prevention in the Home Falls can cause injuries. They can happen to people of all ages. There are many things you can do to make your home safe and to help prevent falls. What can I do on the outside of my home? Regularly fix the edges of walkways and driveways and fix any cracks. Remove anything that might make you trip as you walk through a door, such as a raised step or threshold. Trim any bushes or trees on the path to your home. Use bright outdoor lighting. Clear any walking paths of anything that might make someone trip, such as rocks or tools. Regularly check to see if handrails are loose or broken. Make sure that both sides of any steps have handrails. Any raised decks and porches should have guardrails on the edges. Have any leaves, snow, or ice cleared regularly. Use sand or salt on walking paths during winter. Clean up any spills in your garage right away. This includes oil or grease spills. What can I do in the bathroom? Use night lights. Install grab bars by the toilet and in the tub and shower. Do not use towel bars as grab bars. Use non-skid mats or decals in the tub or shower. If you need to sit down in the shower, use a plastic, non-slip stool. Keep the floor dry. Clean up any water that spills on the floor as soon as it happens. Remove soap buildup in the tub or shower regularly. Attach bath mats securely with double-sided non-slip rug tape. Do not have throw rugs and other things on the floor that can make you trip. What can I do in the bedroom? Use night lights. Make sure that you have a  light by your bed that is easy to reach. Do not use any sheets or blankets that are too big for your bed. They should not hang down onto the floor. Have a firm chair that has side arms. You can use this for support while you get dressed. Do not have throw rugs and other things on the floor that can make you trip. What can I do in the kitchen? Clean up any spills right away. Avoid walking on wet floors. Keep items that you use a lot in easy-to-reach places. If you need to reach something above you, use a strong step stool that has a grab bar. Keep electrical cords out of the way. Do not use floor polish or wax that makes floors slippery. If you must use wax, use non-skid floor wax. Do not have throw rugs and other things on the floor that can make you trip. What can I do with my stairs? Do not leave any items on the stairs. Make sure that there are handrails on both sides of the stairs and use them. Fix handrails that are broken or loose. Make sure that handrails are as long as the stairways. Check any carpeting to make sure that it is firmly attached to the stairs. Fix any carpet that is loose or worn. Avoid having throw rugs at the top or bottom of the stairs. If  you do have throw rugs, attach them to the floor with carpet tape. Make sure that you have a light switch at the top of the stairs and the bottom of the stairs. If you do not have them, ask someone to add them for you. What else can I do to help prevent falls? Wear shoes that: Do not have high heels. Have rubber bottoms. Are comfortable and fit you well. Are closed at the toe. Do not wear sandals. If you use a stepladder: Make sure that it is fully opened. Do not climb a closed stepladder. Make sure that both sides of the stepladder are locked into place. Ask someone to hold it for you, if possible. Clearly mark and make sure that you can see: Any grab bars or handrails. First and last steps. Where the edge of each step  is. Use tools that help you move around (mobility aids) if they are needed. These include: Canes. Walkers. Scooters. Crutches. Turn on the lights when you go into a dark area. Replace any light bulbs as soon as they burn out. Set up your furniture so you have a clear path. Avoid moving your furniture around. If any of your floors are uneven, fix them. If there are any pets around you, be aware of where they are. Review your medicines with your doctor. Some medicines can make you feel dizzy. This can increase your chance of falling. Ask your doctor what other things that you can do to help prevent falls. This information is not intended to replace advice given to you by your health care provider. Make sure you discuss any questions you have with your health care provider. Document Released: 10/13/2009 Document Revised: 05/24/2016 Document Reviewed: 01/21/2015 Elsevier Interactive Patient Education  2017 [employer].

## 2022-08-24 NOTE — Progress Notes (Signed)
Subjective:   Faith Edwards is a 88 y.o. female who presents for Medicare Annual (Subsequent) preventive examination.  Review of Systems     Cardiac Risk Factors include: advanced age (>33mn, >>74women);dyslipidemia     Objective:    Today's Vitals   08/24/22 0923  BP: 128/71  Pulse: 93  Temp: (!) 96.9 F (36.1 C)  TempSrc: Temporal  SpO2: 97%  Weight: 165 lb (74.8 kg)  Height: '5\' 5"'$  (1.651 m)   Body mass index is 27.46 kg/m.     08/24/2022    7:55 AM 08/14/2022   12:25 PM 08/13/2022   11:53 AM 06/01/2022   10:18 AM 11/27/2021   10:16 AM 10/06/2021    1:08 PM 10/03/2021   10:40 AM  Advanced Directives  Does Patient Have a Medical Advance Directive? No No No No No No No  Would patient like information on creating a medical advance directive? No - Patient declined No - Patient declined No - Patient declined No - Patient declined  No - Patient declined     Current Medications (verified) Outpatient Encounter Medications as of 08/24/2022  Medication Sig   Apoaequorin (PREVAGEN EXTRA STRENGTH) 20 MG CAPS Take 1 capsule by mouth daily.   ascorbic acid (VITAMIN C) 500 MG tablet Take 500 mg by mouth daily.   atorvastatin (LIPITOR) 20 MG tablet Take 1 tablet (20 mg total) by mouth daily.   cholecalciferol (VITAMIN D) 1000 UNITS tablet Take 1,000 Units by mouth daily.   Cyanocobalamin (B-12) 3000 MCG SUBL Place 1 tablet under the tongue daily.   denosumab (PROLIA) 60 MG/ML SOSY injection Inject 60 mg into the skin every 6 (six) months.   DULoxetine (CYMBALTA) 30 MG capsule Take 30 mg by mouth daily.   No facility-administered encounter medications on file as of 08/24/2022.    Allergies (verified) Penicillins and Sulfa antibiotics   History: Past Medical History:  Diagnosis Date   Anxiety    Arthritis    Asthma    Bronchitis    hx of    Chronic headaches    [facility]   COVID-19    Cystitis    1954   Depression    Ear pain    bilat    Hiatal hernia     History of blood clots    [facility]    History of blood transfusion    2010   Hx of blood clots 2007 & 2010   post TKR   Hyperthyroidism    Pelvic fracture (HCC)    hx of in three places secondary to fall    Pneumonia    hx of 1952   [place]   on [place]   Right wrist fracture    hx of 1995 has plate secondary to fall    Shaking    hands bilat   [facility]hing[facility]     [facility]    Shortness of breath dyspnea    Skin cancer    basal cell    Spinal stenosis    Trauma    left lower leg 10/22/2014    Urinary frequency    Urticaria    Past Surgical History:  Procedure Laterality Date   [facility]AL [facility]  [facility]    [place]   [facility]  COLONOSCOPY     diverticulosis   ESI   11/2013    C7-T1 ; Dr Nelva Bush   EYE SURGERY     bilat cataract surgery    INSERTION OF MESH N/A 07/08/2015   Procedure: INSERTION OF MESH;  Surgeon: Armandina Gemma, MD;  [facility];  Service: General;  Laterality: N/A;   REPLACEMENT TOTAL KNEE BILATERAL  2007 & 2010   SP FACET INJECTION  01/19/14   RC2-3,3-4,C4-5   TONSILLECTOMY  1945    per [facility] new patient packet    VENTRAL HERNIA REPAIR N/A 07/08/2015   Procedure: VENTRAL ADULT REPAIR VENTRAL INCISIONAL HERNIA REPAIR;  Surgeon: Armandina Gemma, MD;  [facility];  Service: General;  Laterality: N/A;   WRIST FRACTURE SURGERY Bilateral 10  years and 2 years/   WRIST SURGERY Left 06/2016   plate    Family History  Problem Relation Age of Onset   Skin cancer Mother    Pneumonia Mother        1/2 lung loss per [facility] New Patient Packet    Clotting disorder Father        PTE X 2   Skin cancer Father    Heart failure Father        Per [facility] New Patient Packet    Allergies Brother    Asthma Brother    Heart attack Brother        > 17   Bladder Cancer Brother    Diabetes Daughter     Migraines Daughter    Heart attack Daughter    Breast cancer Daughter    Schizophrenia Son        Per [facility] New Patient Packet    Lung disease Son        Per [facility] New Patient Packet    Mental illness Son        suicide by hanging   Diabetes Son    Hepatitis C Son    Sudden death Other        2 M aunts & 2 M uncles in 49s   Stroke Neg Hx    Social History   Socioeconomic History   Marital status: Married    Spouse name: Not on file   Number of children: 3   Years of education: Not on file   Highest education level: Not on file  Occupational History   Occupation: retired  Tobacco Use   Smoking status: Never   Smokeless tobacco: Never  Vaping Use   Vaping Use: Never used  Substance and Sexual Activity   Alcohol use: Yes    Alcohol/week: 4.0 standard drinks of alcohol    Types: 4 Glasses of wine per week    Comment: minimal   Drug use: No   Sexual activity: Not Currently  Other Topics Concern   Not on file  Social History Narrative   Per [place] Patient Packet, abstracted on 06/06/2020:      Diet: Keto Diet x 1 month, no red meat       Caffeine: Yes, coffee      Married, if yes what year: Yes, 1990 (last, 3rd)      Do you live in a house, apartment, assisted living, condo, trailer, ect: Hose      Is it one or more stories: 3 stories, 1 person       Pets: 1 Neurosurgeon       Current/Past profession: Gaffer, 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 No   Do you have difficulty preparing food or eating? No   Do you have difficulty managing your medications? No   Do you have difficulty managing your finances? No   Do you have difficulty affording your medications? No   Social Determinants of Health    Financial Resource Strain: Low Risk  (07/18/2018)   Overall Financial Resource Strain (CARDIA)    Difficulty of Paying Living Expenses: Not hard at all  Food Insecurity: No Food Insecurity (07/18/2018)   Hunger Vital Sign    Worried About Running Out of Food in the Last Year: Never true    Ran Out of Food in the Last Year: Never true  Transportation Needs: No Transportation Needs (07/18/2018)   PRAPARE - Hydrologist (Medical): No    Lack of Transportation (Non-Medical): No  Physical Activity: Unknown (08/26/2019)   Exercise Vital Sign    Days of Exercise per Week: 5 days    Minutes of Exercise per Session: Not on file  Stress: No Stress Concern Present (07/18/2018)   [place]    Feeling of Stress : Only a little  Social Connections: Socially Integrated (07/18/2018)   Social Connection and Isolation Panel [NHANES]    Frequency of Communication with Friends and Family: More than three times a week    Frequency of Social Gatherings with Friends and Family: More than three times a week    Attends Religious Services: More than 4 times per year    Active Member of [employer] or Organizations: Yes    Attends Music therapist: More than 4 times per year    Marital Status: Married    Tobacco Counseling Counseling given: Not Answered   Clinical Intake:  Pre-visit preparation completed: Yes  Pain : No/denies pain     BMI - recorded: 27 Nutritional Risks: None Diabetes: No  How often do you need to have someone help you when you read instructions, pamphlets, or other written materials from your doctor or pharmacy?: 1 - Never  Diabetic?no         Activities of Daily Living    08/24/2022    9:46 AM  In your present state of health, do you have any difficulty performing the following activities:  Hearing? 1  Comment supposed to have hearing aides  Vision? 1  Difficulty  concentrating or making decisions? 1  Comment difficult remembering things  Walking or climbing stairs? 1  Comment difficult to do stairs, she is very cautious  Dressing or bathing? 0  Doing errands, shopping? 0  Preparing Food and eating ? Y  Comment hard time cooking  Using the Toilet? N  In the past six months, have you accidently leaked urine? Y  Do you have problems with loss of bowel control? N  Managing your Medications? N  Managing your Finances? N  Housekeeping or managing your Housekeeping? N    Patient Care Team: Lauree Chandler, NP as PCP - General (Geriatric Medicine) Tanda Rockers, MD as Consulting Physician (Pulmonary Disease)  Marica Otter, [place] (Optometry) Norma Fredrickson, MD as Consulting Physician (Psychiatry) Philemon Kingdom, MD as Consulting Physician (Internal Medicine) Leatha Gilding, MD as Referring Physician (Orthopedic Surgery) Harold Hedge, Darrick Grinder, MD as Consulting Physician (Allergy and Immunology) Ladene Artist, MD as Consulting Physician (Gastroenterology) Pedro Earls, MD as Attending Physician (Family Medicine) Melida Quitter, MD as Consulting Physician (Otolaryngology)  Indicate any recent Medical Services you may have received from other than [facility] providers in the past year (date may be approximate).     Assessment:   This is a routine wellness examination for Amand.  Hearing/Vision screen No results found.  Dietary issues and exercise activities discussed: Current Exercise Habits: Home exercise routine, Type of exercise: walking, Time (Minutes): 20, Frequency (Times/Week): 7, Weekly Exercise (Minutes/Week): 140   Goals Addressed   None    Depression Screen    08/24/2022    9:27 AM 08/14/2022   12:26 PM 06/01/2022   10:19 AM 01/30/2022    2:35 PM 01/05/2021    2:47 PM 10/24/2020    3:29 PM 06/20/2020    1:33 PM  PHQ 2/9 Scores  PHQ - 2 Score 0 0 0 0 0 0 0    Fall Risk    08/24/2022    9:26 AM 08/14/2022   12:26 PM  08/13/2022    2:06 PM 06/01/2022   10:18 AM 11/27/2021   10:12 AM  Fall Risk   Falls in the past year? 0 0 0 1 1  Number falls in past yr: 0 0 0 0 1  Injury with Fall? 0 0 0 0 0  Risk for fall due to : No Fall Risks No Fall Risks No Fall Risks No Fall Risks History of fall(s)  Follow up Falls evaluation completed Falls evaluation completed Falls evaluation completed Falls evaluation completed Falls evaluation completed    FALL RISK PREVENTION PERTAINING TO THE HOME:  Any stairs in or around the home? Yes  If so, are there any without handrails? No  Home free of loose throw rugs in walkways, pet beds, electrical cords, etc? Yes  Adequate lighting in your home to reduce risk of falls? Yes   ASSISTIVE DEVICES UTILIZED TO PREVENT FALLS:  Life alert? No  Use of a cane, walker or w/c? No  Grab bars in the bathroom? Yes  Shower chair or bench in shower? Yes  Elevated toilet seat or a handicapped toilet? No   TIMED UP AND GO:  Was the test performed? No .    Gait steady and fast without use of assistive device  Cognitive Function:    08/24/2022    9:28 AM 06/20/2020    3:11 PM 08/26/2019    3:41 PM 07/18/2018    2:57 PM 05/07/2017    3:47 PM  MMSE - Mini Mental State Exam  Not completed:   Refused    Orientation to time '4 5  5 3  '$ Orientation to Place '4 5  5 5  '$ Registration '3 3  3 3  '$ Attention/ Calculation '4 5  5 5  '$ Recall 0 3  0 3  Language- name 2 objects '2 2  2 2  '$ Language- repeat 0 '1  1 1  '$ Language- follow 3 step command '3 3  3 3  '$ Language- read & follow direction '1 1  1 1  '$ Write a sentence '1 1  1 1  '$ Copy design 0 '1  1 1  '$ Total score '22 30  27 '$ 28  Immunizations Immunization History  Administered Date(s) Administered   Fluad Quad(high Dose 65+) 09/24/2019, 10/24/2020, 09/21/2021   Influenza Whole 10/28/2008, 09/28/2009, 10/08/2011   Influenza, Seasonal, Injecte, Preservative Fre 11/03/2013   Influenza,inj,Quad PF,6+ Mos 10/05/2014, 09/30/2015    Influenza-Unspecified 09/25/2016, 09/09/2017, 07/31/2018   PFIZER(Purple Top)SARS-COV-2 Vaccination 01/01/2020, 01/30/2020, 12/11/2020   Pneumococcal Conjugate-13 09/30/2015   Pneumococcal Polysaccharide-23 01/07/2014   Tdap 10/10/2014, 07/01/2021   Zoster Recombinat (Shingrix) 07/05/2018, 09/09/2018    TDAP status: Up to date  Flu Vaccine status: Due, Education has been provided regarding the importance of this vaccine. Advised may receive this vaccine at local pharmacy or Health Dept. Aware to provide a copy of the vaccination record if obtained from local pharmacy or Health Dept. Verbalized acceptance and understanding.  Pneumococcal vaccine status: Up to date  Covid-19 vaccine status: Information provided on how to obtain vaccines.   Qualifies for Shingles Vaccine? Yes   Zostavax completed No   Shingrix Completed?: Yes  Screening Tests Health Maintenance  Topic Date Due   COVID-19 Vaccine (4 - Pfizer risk series) 02/05/2021   DEXA SCAN  10/13/2021   INFLUENZA VACCINE  07/31/2022   TETANUS/TDAP  07/02/2031   Pneumonia Vaccine 38+ Years old  Completed   Zoster Vaccines- Shingrix  Completed   HPV VACCINES  Aged Out    Health Maintenance  Health Maintenance Due  Topic Date Due   COVID-19 Vaccine (4 - Pfizer risk series) 02/05/2021   DEXA SCAN  10/13/2021   INFLUENZA VACCINE  07/31/2022    Colorectal cancer screening: No longer required.   Mammogram status: No longer required due to age.  Bone Density status: Ordered  . Pt provided with contact info and advised to call to schedule appt.  Lung Cancer Screening: (Low Dose CT Chest recommended if Age 21-80 years, 30 pack-year currently smoking OR have quit w/in 15years.) does not qualify.   Lung Cancer Screening Referral: na  Additional Screening:  Hepatitis C Screening: does not qualify; Completed   Vision Screening: Recommended annual ophthalmology exams for early detection of glaucoma and other disorders of the  eye. Is the patient up to date with their annual eye exam?  Yes  Who is the provider or what is the name of the office in which the patient attends annual eye exams? [facility] If pt is not established with a provider, would they like to be referred to a provider to establish care? No .   Dental Screening: Recommended annual dental exams for proper oral hygiene  Community Resource Referral / Chronic Care Management: CRR required this visit?  No   CCM required this visit?  No      Plan:     I have personally reviewed and noted the following in the patient's chart:   Medical and social history Use of alcohol, tobacco or illicit drugs  Current medications and supplements including opioid prescriptions. Patient is not currently taking opioid prescriptions. Functional ability and status Nutritional status Physical activity Advanced directives List of other physicians Hospitalizations, surgeries, and ER visits in previous 12 months Vitals Screenings to include cognitive, depression, and falls Referrals and appointments  In addition, I have reviewed and discussed with patient certain preventive protocols, quality metrics, and best practice recommendations. A written personalized care plan for preventive services as well as general preventive health recommendations were provided to patient.     Lauree Chandler, NP   08/24/2022   Place of service: [facility]

## 2022-08-30 DIAGNOSIS — F3181 Bipolar II disorder: Secondary | ICD-10-CM | POA: Diagnosis not present

## 2022-10-08 DIAGNOSIS — R300 Dysuria: Secondary | ICD-10-CM | POA: Diagnosis not present

## 2022-11-13 DIAGNOSIS — Z23 Encounter for immunization: Secondary | ICD-10-CM | POA: Diagnosis not present

## 2022-11-14 ENCOUNTER — Telehealth: Payer: Self-pay | Admitting: Gastroenterology

## 2022-11-14 ENCOUNTER — Other Ambulatory Visit: Payer: Self-pay

## 2022-11-14 DIAGNOSIS — K625 Hemorrhage of anus and rectum: Secondary | ICD-10-CM

## 2022-11-14 DIAGNOSIS — R1030 Lower abdominal pain, unspecified: Secondary | ICD-10-CM

## 2022-11-14 NOTE — Telephone Encounter (Signed)
CBC, CMET today if she can Dx rectal bleeding and lower abdominal pain  Liquid diet for now  Tylenol for pain  I will see her tomorrow - can convert one of my banding slots to a f/u  If she worsens before she can see [place] then go to ED (would try [facility] first)

## 2022-11-14 NOTE — Telephone Encounter (Signed)
Left message for patient to call back. Orders placed.  ?

## 2022-11-14 NOTE — Telephone Encounter (Signed)
Inbound call from patient stating she has had blood in her stool. Please advise.  Thank you

## 2022-11-14 NOTE — Telephone Encounter (Signed)
Spoke with patient regarding MD recommendations. OV scheduled for tomorrow 11/15/22 at 3:10 pm. She is unable to get labs drawn today d/t transporation, however plans to come earlier tomorrow prior to visit. She's been advised on when/where to go for labs & verbalized all understanding.

## 2022-11-14 NOTE — Telephone Encounter (Signed)
Patient called in with complaints of bright red blood in her stool, nausea w/o vomiting, and intermittent, cramping, lower-mid abdominal pain (6/10) for 1 week. She says her stools are formed & usually dark d/t frequent intake of blueberries, but this is the first time she's experienced bright red bleeding with abdominal pain. She is tolerating food/liquids normally. She was last seen in [place] on 04/16/22 with Dr. Fuller Plan. Pt seeking further recommendations.

## 2022-11-15 ENCOUNTER — Ambulatory Visit (INDEPENDENT_AMBULATORY_CARE_PROVIDER_SITE_OTHER): Payer: Medicare Other | Admitting: Internal Medicine

## 2022-11-15 ENCOUNTER — Encounter: Payer: Self-pay | Admitting: Internal Medicine

## 2022-11-15 ENCOUNTER — Other Ambulatory Visit (INDEPENDENT_AMBULATORY_CARE_PROVIDER_SITE_OTHER): Payer: Medicare Other

## 2022-11-15 VITALS — BP 110/60 | HR 100 | Ht 65.0 in | Wt 165.0 lb

## 2022-11-15 DIAGNOSIS — R1030 Lower abdominal pain, unspecified: Secondary | ICD-10-CM | POA: Diagnosis not present

## 2022-11-15 DIAGNOSIS — K5909 Other constipation: Secondary | ICD-10-CM | POA: Diagnosis not present

## 2022-11-15 DIAGNOSIS — K625 Hemorrhage of anus and rectum: Secondary | ICD-10-CM | POA: Diagnosis not present

## 2022-11-15 DIAGNOSIS — K648 Other hemorrhoids: Secondary | ICD-10-CM

## 2022-11-15 LAB — COMPREHENSIVE METABOLIC PANEL
ALT: 17 U/L (ref 0–35)
AST: 25 U/L (ref 0–37)
Albumin: 4.1 g/dL (ref 3.5–5.2)
Alkaline Phosphatase: 84 U/L (ref 39–117)
BUN: 41 mg/dL — ABNORMAL HIGH (ref 6–23)
CO2: 25 mEq/L (ref 19–32)
Calcium: 9.2 mg/dL (ref 8.4–10.5)
Chloride: 103 mEq/L (ref 96–112)
Creatinine, Ser: 1.19 mg/dL (ref 0.40–1.20)
GFR: 40.74 mL/min — ABNORMAL LOW (ref >60.00)
Glucose, Bld: 95 mg/dL (ref 70–99)
Potassium: 5.1 mEq/L (ref 3.5–5.1)
Sodium: 135 mEq/L (ref 135–145)
Total Bilirubin: 0.6 mg/dL (ref 0.2–1.2)
Total Protein: 6.8 g/dL (ref 6.0–8.3)

## 2022-11-15 LAB — CBC
HCT: 39.1 % (ref 36.0–46.0)
Hemoglobin: 13.2 g/dL (ref 12.0–15.0)
MCHC: 33.7 g/dL (ref 30.0–36.0)
MCV: 96.1 fl (ref 78.0–100.0)
Platelets: 269 10*3/uL (ref 150.0–400.0)
RBC: 4.07 Mil/uL (ref 3.87–5.11)
RDW: 13.4 % (ref 11.5–15.5)
WBC: 7.1 10*3/uL (ref 4.0–10.5)

## 2022-11-15 MED ORDER — HYDROCORTISONE (PERIANAL) 2.5 % EX CREA: 1.0000 | TOPICAL_CREAM | Freq: Two times a day (BID) | CUTANEOUS | 1 refills | Status: DC | PRN

## 2022-11-15 NOTE — Progress Notes (Signed)
Faith Edwards 88 y.o. [birth year] [id number]  Assessment & Plan:   Encounter Diagnoses  Name Primary?   Bleeding internal hemorrhoids Yes   Chronic constipation   The patient was reassured to know she is having bleeding hemorrhoids.  Will prescribe hydrocortisone cream as needed.  She will continue her MiraLAX which treats her chronic constipation.  She was advised that if she sees slight blood on the tissue paper to not be alarmed but she certainly may follow-up here as needed and if this character of the bleeding changes i.e. she is passing blood  Lab Results  Component Value Date   WBC 7.1 11/15/2022   HGB 13.2 11/15/2022   HCT 39.1 11/15/2022   MCV 96.1 11/15/2022   PLT 269.0 11/15/2022     Chemistry      Component Value Date/Time   NA 135 11/15/2022 1438   K 5.1 11/15/2022 1438   CL 103 11/15/2022 1438   CO2 25 11/15/2022 1438   BUN 41 (H) 11/15/2022 1438   CREATININE 1.19 11/15/2022 1438   CREATININE 0.99 (H) 11/27/2021 1050      Component Value Date/Time   CALCIUM 9.2 11/15/2022 1438   ALKPHOS 84 11/15/2022 1438   AST 25 11/15/2022 1438   ALT 17 11/15/2022 1438   BILITOT 0.6 11/15/2022 1438        Subjective:   Chief Complaint: Rectal bleeding  HPI 88 year old white woman with a history of chronic constipation and abdominal pain followed by Dr. Fuller Plan, who became alarmed recently when she started seeing bright red blood on the tissue paper after defecation.  She said this is never happened before.  There is no melena though stools are dark because she eats blueberries every day she thinks.  That is really unchanged.  Abdominal pain is mild in the right lower quadrant and not too bothersome.  She had called in yesterday with these complaints and I recommended she have lab work (see above) and be evaluated.  She has not had any anal pain or irritation.  No passage of blood into the commode or on her in the stool.  For the last couple of days the rectal bleeding  seen has abated.   Colonoscopy and EGD performed in October 2019 mild left colonic diverticulosis and medium hiatal hernia otherwise normal. Allergies  Allergen Reactions   Penicillins Hives   Sulfa Antibiotics Hives and Nausea And Vomiting    7-15   Current Meds  Medication Sig   ascorbic acid (VITAMIN C) 500 MG tablet Take 500 mg by mouth daily.   atorvastatin (LIPITOR) 20 MG tablet Take 1 tablet (20 mg total) by mouth daily.   cholecalciferol (VITAMIN D) 1000 UNITS tablet Take 1,000 Units by mouth daily.   Cyanocobalamin (B-12) 3000 MCG SUBL Place 1 tablet under the tongue daily.   denosumab (PROLIA) 60 MG/ML SOSY injection Inject 60 mg into the skin every 6 (six) months.   DULoxetine (CYMBALTA) 30 MG capsule Take 30 mg by mouth daily.   Past Medical History:  Diagnosis Date   Anxiety    Arthritis    Asthma    Bronchitis    hx of    Chronic headaches    [facility] Clinic   COVID-19    Cystitis    1954   Depression    Ear pain    bilat    Hiatal hernia    History of blood clots    Lungs, per [facility] new patient packet  History of blood transfusion    2010   Hx of blood clots 2007 & 2010   post TKR   Hyperthyroidism    Pelvic fracture (HCC)    hx of in three places secondary to fall    Pneumonia    hx of 1952   Pulmonary embolism ([place]) 1993   on HRT   Right wrist fracture    hx of 1995 has plate secondary to fall    Shaking    hands bilat   Shingles     per [facility] new patient packet    Shortness of breath dyspnea    Skin cancer    basal cell    Spinal stenosis    Trauma    left lower leg 10/22/2014    Urinary frequency    Urticaria    Past Surgical History:  Procedure Laterality Date   ABDOMINAL HYSTERECTOMY  1969    per [facility] new patient packet    [place]    per [facility] new patient packet    COLONOSCOPY     diverticulosis   ESI   11/2013    C7-T1 ; Dr Nelva Bush   EYE SURGERY     bilat  cataract surgery    INSERTION OF MESH N/A 07/08/2015   Procedure: INSERTION OF MESH;  Surgeon: Armandina Gemma, MD;  Location[facility];  Service: General;  Laterality: N/A;   REPLACEMENT TOTAL KNEE BILATERAL  2007 & 2010   SP FACET INJECTION  01/19/14   RC2-3,3-4,C4-5   TONSILLECTOMY  1945    per [facility] new patient packet    VENTRAL HERNIA REPAIR N/A 07/08/2015   Procedure: VENTRAL ADULT REPAIR VENTRAL INCISIONAL HERNIA REPAIR;  Surgeon: Armandina Gemma, MD;  Location[facility];  Service: General;  Laterality: N/A;   WRIST FRACTURE SURGERY Bilateral 10  years and 2 years/   WRIST SURGERY Left 06/2016   plate    Social History   Social History Narrative   Per [place] Patient Packet, abstracted on 06/06/2020:      Diet: Keto Diet x 1 month, no red meat       Caffeine: Yes, coffee      Married, if yes what year: Yes, 1990 (last, 3rd)      Do you live in a house, apartment, assisted living, condo, trailer, ect: Hose      Is it one or more stories: 3 stories, 1 person       Pets: 1 Neurosurgeon       Current/Past profession: Gaffer, 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 No   Do you have difficulty preparing food or eating? No   Do you have difficulty managing your medications? No   Do you have difficulty managing your finances? No   Do you have difficulty affording your medications? No   family history includes Allergies in her brother; Asthma in her brother; Bladder Cancer in her brother; Breast cancer in her daughter; Clotting disorder  in her father; Diabetes in her daughter and son; Heart attack in her brother and daughter; Heart failure in her father; Hepatitis C in her son; Lung disease in her son; Mental illness in her son; Migraines  in her daughter; Pneumonia in her mother; Schizophrenia in her son; Skin cancer in her father and mother; Sudden death in an other family member.   Review of Systems As above  Objective:   Physical Exam BP 110/60   Pulse 100   Ht '5\' 5"'$  (1.651 m)   Wt 165 lb (74.8 kg)   BMI 27.46 kg/m   NAD elderly ww  Patti [place]ique, CMA present.  Abd is soft, NT  Rectal - NL anoderm, DRE  w/ some anal canal irregularity/induration w/o mass, tenderness  Anoscopy - Gr 2 erythematous internal hemorrhoids all positions

## 2022-11-15 NOTE — Patient Instructions (Signed)
We have sent the following medications to your pharmacy for you to pick up at your convenience: Hydrocortisone cream  We will let you know the lab results.   I appreciate the opportunity to care for you. Silvano Rusk M.D.

## 2022-12-04 ENCOUNTER — Encounter: Payer: Medicare Other | Admitting: Nurse Practitioner

## 2022-12-19 ENCOUNTER — Telehealth: Payer: Self-pay | Admitting: Gastroenterology

## 2022-12-19 NOTE — Telephone Encounter (Signed)
Spoke with patient & advised her that the reason her appointment was cancelled previously was because she was able to be seen sooner with Dr. Carlean Purl back in November while Dr. Fuller Plan was out of office to evaluate her symptoms. She didn't realize this & I apologized for the miscommunication. She says her symptoms are being managed since her last OV, however she is requesting that she be put back on as an "annual follow up" with Dr. Fuller Plan & to discuss her hemorrhoids and further testing. There was a cancellation for tomorrow, so she has been added back on as originally planned.

## 2022-12-19 NOTE — Telephone Encounter (Signed)
Patient is calling states she was not aware of the cancellation of her appt tomorrow and she is wanting to speak with the nurse. Please advise

## 2022-12-20 ENCOUNTER — Encounter: Payer: Self-pay | Admitting: Gastroenterology

## 2022-12-20 ENCOUNTER — Ambulatory Visit (INDEPENDENT_AMBULATORY_CARE_PROVIDER_SITE_OTHER): Payer: Medicare Other | Admitting: Gastroenterology

## 2022-12-20 ENCOUNTER — Ambulatory Visit: Payer: Medicare Other | Admitting: Gastroenterology

## 2022-12-20 VITALS — BP 124/76 | HR 87 | Ht 65.0 in | Wt 169.0 lb

## 2022-12-20 DIAGNOSIS — K5909 Other constipation: Secondary | ICD-10-CM

## 2022-12-20 DIAGNOSIS — K641 Second degree hemorrhoids: Secondary | ICD-10-CM | POA: Diagnosis not present

## 2022-12-20 DIAGNOSIS — K219 Gastro-esophageal reflux disease without esophagitis: Secondary | ICD-10-CM | POA: Diagnosis not present

## 2022-12-20 NOTE — Progress Notes (Signed)
    Assessment     Grade 2 internal hemorrhoids, currently asymptomatic Chronic constipation, well-controlled GERD, well-controlled on diet   Recommendations    Hydrocortisone cream PR bid prn hemorrhoid symptoms Miralax qd  No plans for future screening colonoscopies Return to PCP for ongoing care. GI follow prn on referral from PCP.    HPI    This is an 88 year old female returning for follow-up of internal hemorrhoids, constipation and GERD.  She was evaluated by Dr. Arelia Longest on November 16 for bleeding internal hemorrhoids.  Anoscopy was performed showing 3 columns of grade 2 internal hemorrhoids.  She was prescribed hydrocortisone cream as needed and her symptoms have resolved.  She was advised to continue MiraLAX for chronic constipation which is working well.    Colonoscopy Oct 2019  Mild sigmoid diverticulosis o/w negative  Anoscopy Nov 2023 Grade 2 internal hemorrhoids   Labs / Imaging       Latest Ref Rng & Units 11/15/2022    2:38 PM 04/16/2022    3:41 PM 09/27/2021   12:44 PM  Hepatic Function  Total Protein 6.0 - 8.3 g/dL 6.8  7.0  7.0   Albumin 3.5 - 5.2 g/dL 4.1  4.1  4.1   AST 0 - 37 U/L _0 ALT 0 - 35 U/L _1 Alk Phosphatase 39 - 117 U/L 84  69  61   Total Bilirubin 0.2 - 1.2 mg/dL 0.6  0.7  0.9        Latest Ref Rng & Units 11/15/2022    2:38 PM 04/16/2022    3:41 PM 10/03/2021   11:19 AM  CBC  WBC 4.0 - 10.5 K/uL 7.1  7.7  6.8   Hemoglobin 12.0 - 15.0 g/dL 13.2  12.6  12.8   Hematocrit 36.0 - 46.0 % 39.1  37.6  39.1   Platelets 150.0 - 400.0 K/uL 269.0  243.0  265     Current Medications, Allergies, Past Medical History, Past Surgical History, Family History and Social History were reviewed in [employer] record.   Physical Exam: General: Well developed, well nourished, elderly, no acute distress Head: Normocephalic and atraumatic Eyes: Sclerae anicteric, EOMI Ears: Normal auditory acuity Mouth: No  deformities or lesions noted Lungs: Clear throughout to auscultation Heart: Regular rate and rhythm; No murmurs, rubs or bruits Abdomen: Soft, non tender and non distended. No masses, hepatosplenomegaly or hernias noted. Normal Bowel sounds Rectal: Not done Musculoskeletal: Symmetrical with no gross deformities  Pulses:  Normal pulses noted Extremities: No edema or deformities noted Neurological: Alert oriented x 4, grossly nonfocal Psychological:  Alert and cooperative. Normal mood and affect   Faith Edwards T. Fuller Plan, MD 12/20/2022, 9:20 AM

## 2022-12-20 NOTE — Patient Instructions (Signed)
Continue hydrocortisone cream as needed for hemorrhoid symptoms.  Return to your primary care doctor for ongoing care. Follow up with [place] as needed. This is on the referral from your primary care physician.   The [place] [facility] providers would like to encourage you to use [facility] to communicate with providers for non-urgent requests or questions.  Due to long hold times on the telephone, sending your provider a message by [facility] may be a faster and more efficient way to get a response.  Please allow 48 business hours for a response.  Please remember that this is for non-urgent requests.   Thank you for choosing me and [facility].  Pricilla Riffle. Dagoberto Ligas., MD., Marval Regal

## 2022-12-24 ENCOUNTER — Other Ambulatory Visit: Payer: Self-pay | Admitting: Nurse Practitioner

## 2022-12-27 ENCOUNTER — Encounter: Payer: Self-pay | Admitting: Nurse Practitioner

## 2022-12-28 ENCOUNTER — Encounter: Payer: Self-pay | Admitting: Nurse Practitioner

## 2022-12-28 ENCOUNTER — Ambulatory Visit (INDEPENDENT_AMBULATORY_CARE_PROVIDER_SITE_OTHER): Payer: Medicare Other | Admitting: Nurse Practitioner

## 2022-12-28 VITALS — BP 122/78 | HR 92 | Temp 97.0°F | Ht 65.0 in | Wt 169.0 lb

## 2022-12-28 DIAGNOSIS — H9193 Unspecified hearing loss, bilateral: Secondary | ICD-10-CM

## 2022-12-28 DIAGNOSIS — E7849 Other hyperlipidemia: Secondary | ICD-10-CM | POA: Diagnosis not present

## 2022-12-28 DIAGNOSIS — M8589 Other specified disorders of bone density and structure, multiple sites: Secondary | ICD-10-CM | POA: Diagnosis not present

## 2022-12-28 DIAGNOSIS — F339 Major depressive disorder, recurrent, unspecified: Secondary | ICD-10-CM

## 2022-12-28 DIAGNOSIS — K5904 Chronic idiopathic constipation: Secondary | ICD-10-CM

## 2022-12-28 DIAGNOSIS — N1831 Chronic kidney disease, stage 3a: Secondary | ICD-10-CM | POA: Diagnosis not present

## 2022-12-28 DIAGNOSIS — E2839 Other primary ovarian failure: Secondary | ICD-10-CM

## 2022-12-28 DIAGNOSIS — M810 Age-related osteoporosis without current pathological fracture: Secondary | ICD-10-CM | POA: Diagnosis not present

## 2022-12-28 MED ORDER — DENOSUMAB 60 MG/ML ~~LOC~~ SOSY
60.0000 mg | PREFILLED_SYRINGE | Freq: Once | SUBCUTANEOUS | Status: AC
  Administered 2022-12-28: 60 mg via SUBCUTANEOUS

## 2022-12-28 NOTE — Progress Notes (Signed)
Careteam: Patient Care Team: Lauree Chandler, NP as PCP - General (Geriatric Medicine) Tanda Rockers, MD as Consulting Physician (Pulmonary Disease) Marica Otter, [place] (Optometry) Norma Fredrickson, MD as Consulting Physician (Psychiatry) Philemon Kingdom, MD as Consulting Physician (Internal Medicine) Leatha Gilding, MD as Referring Physician (Orthopedic Surgery) Harold Hedge, Darrick Grinder, MD as Consulting Physician (Allergy and Immunology) Ladene Artist, MD as Consulting Physician (Gastroenterology) Pedro Earls, MD as Attending Physician (Family Medicine) Melida Quitter, MD as Consulting Physician (Otolaryngology)  PLACE OF SERVICE:  [place] Directive information Does Patient Have a Medical Advance Directive?: No, Would patient like information on creating a medical advance directive?: No - Patient declined  Allergies  Allergen Reactions   Penicillins Hives   Sulfa Antibiotics Hives and Nausea And Vomiting    7-15    Chief Complaint  Patient presents with   Medical Management of Chronic Issues    4 month follow-up and prolia injection. Discuss need for DEXA or post pone if patient refuses. No recent labs to discuss. NCIR verified. No ACP in Epic      HPI: Patient is a 88 y.o. female for routine follow up.  She went GI due to rectal bleeding for hemorrhoids and was told to start miralax daily and now bowels are moving well.   Reports she walked a mile this morning. She has to walk every day.  She continues to drive and maintains the home- gets the groceries and shops for her husband  No anxiety or depression- well controlled on cymbalta. She lives for the day.   Reports she sneezes at times but no runny nose or itchy eyes.  Will cough but not productive. Does not have to take anything for symptoms  Planning a trip    Review of Systems:  Review of Systems  Constitutional:  Negative for chills, fever and weight loss.  HENT:  Positive for  hearing loss. Negative for tinnitus.   Respiratory:  Negative for cough, sputum production and shortness of breath.   Cardiovascular:  Negative for chest pain, palpitations and leg swelling.  Gastrointestinal:  Negative for abdominal pain, constipation, diarrhea and heartburn.  Genitourinary:  Negative for dysuria, frequency and urgency.  Musculoskeletal:  Negative for back pain, falls, joint pain and myalgias.  Skin: Negative.   Neurological:  Negative for dizziness and headaches.  Endo/Heme/Allergies:  Positive for environmental allergies.  Psychiatric/Behavioral:  Positive for memory loss. Negative for depression. The patient does not have insomnia.     Past Medical History:  Diagnosis Date   Anxiety    Arthritis    Asthma    Bronchitis    hx of    Chronic headaches    [facility]   COVID-19    Cystitis    1954   Depression    Ear pain    bilat    Hiatal hernia    History of blood clots    Lungs, per [place] new patient packet    History of blood transfusion    2010   Hx of blood clots 2007 & 2010   post TKR   Hyperthyroidism    Pelvic fracture (HCC)    hx of in three places secondary to fall    Pneumonia    hx of 1952   Pulmonary embolism ([place]) 1993   on HRT   Right wrist fracture    hx of 1995 has plate secondary to fall    Shaking    hands  bilat   Shingles     per [place] new patient packet    Shortness of breath dyspnea    Skin cancer    basal cell    Spinal stenosis    Trauma    left lower leg 10/22/2014    Urinary frequency    Urticaria    Past Surgical History:  Procedure Laterality Date   ABDOMINAL HYSTERECTOMY  1969    per [facility] new patient packet    [place]    per [facility] new patient packet    COLONOSCOPY     diverticulosis   ESI   11/2013    C7-T1 ; Dr Nelva Bush   EYE SURGERY     bilat cataract surgery    INSERTION OF MESH N/A 07/08/2015   Procedure: INSERTION OF MESH;  Surgeon:  Armandina Gemma, MD;  Location: [facility];  Service: General;  Laterality: N/A;   REPLACEMENT TOTAL KNEE BILATERAL  2007 & 2010   SP FACET INJECTION  01/19/14   RC2-3,3-4,C4-5   TONSILLECTOMY  1945    per [facility] new patient packet    VENTRAL HERNIA REPAIR N/A 07/08/2015   Procedure: VENTRAL ADULT REPAIR VENTRAL INCISIONAL HERNIA REPAIR;  Surgeon: Armandina Gemma, MD;  Location: [facility];  Service: General;  Laterality: N/A;   WRIST FRACTURE SURGERY Bilateral 10  years and 2 years/   WRIST SURGERY Left 06/2016   plate    Social History:   reports that she has never smoked. She has never used smokeless tobacco. She reports current alcohol use of about 4.0 standard drinks of alcohol per week. She reports that she does not use drugs.  Family History  Problem Relation Age of Onset   Skin cancer Mother    Pneumonia Mother        1/2 lung loss per [facility] New Patient Packet    Clotting disorder Father        PTE X 2   Skin cancer Father    Heart failure Father        Per [place] New Patient Packet    Allergies Brother    Asthma Brother    Heart attack Brother        > 36   Bladder Cancer Brother    Diabetes Daughter    Migraines Daughter    Heart attack Daughter    Breast cancer Daughter    Schizophrenia Son        Per [place] New Patient Packet    Lung disease Son        Per [place] New Patient Packet    Mental illness Son        suicide by hanging   Diabetes Son    Hepatitis C Son    Sudden death Other        2 M aunts & 2 M uncles in 18s   Stroke Neg Hx     Medications: Patient's Medications  New Prescriptions   No medications on file  Previous Medications   ASCORBIC ACID (VITAMIN C) 500 MG TABLET    Take 500 mg by mouth daily.   ATORVASTATIN (LIPITOR) 20 MG TABLET    TAKE 1 TABLET(20 MG) BY MOUTH DAILY   CHOLECALCIFEROL (VITAMIN D) 1000 UNITS TABLET    Take 1,000 Units by mouth daily.   CYANOCOBALAMIN (B-12) 3000 MCG SUBL    Place 1 tablet under the tongue daily.   DENOSUMAB (  PROLIA) 60 MG/ML SOSY  INJECTION    Inject 60 mg into the skin every 6 (six) months.   DULOXETINE (CYMBALTA) 30 MG CAPSULE    Take 30 mg by mouth daily.   HYDROCORTISONE (ANUSOL-HC) 2.5 % RECTAL CREAM    Place 1 Application rectally 2 (two) times daily as needed for hemorrhoids or anal itching.  Modified Medications   No medications on file  Discontinued Medications   No medications on file    Physical Exam:  Vitals:   12/27/22 1626  BP: 122/78  Pulse: 92  Temp: (!) 97 F (36.1 C)  TempSrc: Temporal  SpO2: 96%  Weight: 169 lb (76.7 kg)  Height: '5\' 5"'$  (1.651 m)   Body mass index is 28.12 kg/m. Wt Readings from Last 3 Encounters:  12/27/22 169 lb (76.7 kg)  12/20/22 169 lb (76.7 kg)  11/15/22 165 lb (74.8 kg)    Physical Exam Constitutional:      General: She is not in acute distress.    Appearance: She is well-developed. She is not diaphoretic.  HENT:     Head: Normocephalic and atraumatic.     Mouth/Throat:     Pharynx: No oropharyngeal exudate.  Eyes:     Conjunctiva/sclera: Conjunctivae normal.     Pupils: Pupils are equal, round, and reactive to light.  Cardiovascular:     Rate and Rhythm: Normal rate and regular rhythm.     Heart sounds: Normal heart sounds.  Pulmonary:     Effort: Pulmonary effort is normal.     Breath sounds: Normal breath sounds.  Abdominal:     General: Bowel sounds are normal.     Palpations: Abdomen is soft.  Musculoskeletal:     Cervical back: Normal range of motion and neck supple.     Right lower leg: No edema.     Left lower leg: No edema.  Skin:    General: Skin is warm and dry.  Neurological:     Mental Status: She is alert.  Psychiatric:        Mood and Affect: Mood normal.     Labs reviewed: Basic Metabolic Panel: Recent Labs    04/16/22 1541 11/15/22 1438  NA 139 135  K 4.6 5.1  CL 103 103  CO2 27 25  GLUCOSE 101* 95  BUN 29* 41*  CREATININE 1.17 1.19  CALCIUM 9.4 9.2  TSH 2.73  --    Liver Function Tests: Recent Labs     04/16/22 1541 11/15/22 1438  AST 18 25  ALT 14 17  ALKPHOS 69 84  BILITOT 0.7 0.6  PROT 7.0 6.8  ALBUMIN 4.1 4.1   No results for input(s): "LIPASE", "AMYLASE" in the last 8760 hours. No results for input(s): "AMMONIA" in the last 8760 hours. CBC: Recent Labs    04/16/22 1541 11/15/22 1438  WBC 7.7 7.1  NEUTROABS 4.4  --   HGB 12.6 13.2  HCT 37.6 39.1  MCV 95.0 96.1  PLT 243.0 269.0   Lipid Panel: No results for input(s): "CHOL", "HDL", "LDLCALC", "TRIG", "CHOLHDL", "LDLDIRECT" in the last 8760 hours. TSH: Recent Labs    04/16/22 1541  TSH 2.73   A1C: Lab Results  Component Value Date   HGBA1C 5.6 01/05/2021     Assessment/Plan 1. Age-related osteoporosis without current pathological fracture -Recommended to take calcium 600 mg twice daily with Vitamin D 2000 units daily and weight bearing activity 30 mins/5 days a week and to continue prolia - denosumab (PROLIA) injection 60  mg - DG Bone Density; Future  2. Chronic idiopathic constipation -controlled on miralax  3. Other hyperlipidemia -continues on lipitor - Lipid panel  4. Recurrent major depressive disorder, remission status unspecified ([place]) -well controlled on cymbalta.   5. Stage 3a chronic kidney disease (HCC) -Chronic and stable Encourage proper hydration Follow metabolic panel Avoid nephrotoxic meds (NSAIDS)  6. Estrogen deficiency - DG Bone Density; Future   No follow-ups on file.: Son Barkan K. [place], [facility] [phone]

## 2022-12-28 NOTE — Patient Instructions (Signed)
To call [phone] to schedule bone density

## 2022-12-29 LAB — LIPID PANEL
Cholesterol: 166 mg/dL (ref <200)
HDL: 68 mg/dL (ref >=50)
LDL Cholesterol (Calc): 80 mg/dL (calc)
Non-HDL Cholesterol (Calc): 98 mg/dL (calc) (ref <130)
Total CHOL/HDL Ratio: 2.4 (calc) (ref <5.0)
Triglycerides: 95 mg/dL (ref <150)

## 2023-01-28 DIAGNOSIS — F3181 Bipolar II disorder: Secondary | ICD-10-CM | POA: Diagnosis not present

## 2023-02-14 ENCOUNTER — Encounter: Payer: Self-pay | Admitting: Internal Medicine

## 2023-02-14 ENCOUNTER — Ambulatory Visit (INDEPENDENT_AMBULATORY_CARE_PROVIDER_SITE_OTHER): Payer: Medicare Other | Admitting: Internal Medicine

## 2023-02-14 VITALS — BP 118/78 | HR 94 | Ht 65.0 in | Wt 167.0 lb

## 2023-02-14 DIAGNOSIS — E0500 Thyrotoxicosis with diffuse goiter without thyrotoxic crisis or storm: Secondary | ICD-10-CM

## 2023-02-14 DIAGNOSIS — R590 Localized enlarged lymph nodes: Secondary | ICD-10-CM

## 2023-02-14 LAB — T4, FREE: Free T4: 0.74 ng/dL (ref 0.60–1.60)

## 2023-02-14 LAB — T3, FREE: T3, Free: 3 pg/mL (ref 2.3–4.2)

## 2023-02-14 LAB — TSH: TSH: 2.89 u[IU]/mL (ref 0.35–5.50)

## 2023-02-14 NOTE — Patient Instructions (Signed)
Please stop at the lab.  Please come back for a follow-up appointment in 1 year.  

## 2023-02-14 NOTE — Progress Notes (Signed)
Patient ID: Faith Edwards, female   DOB: [birth year], 89 y.o.   MRN: [id number]  HPI  Faith Edwards is a 89 y.o.-year-old female, returning for f/u for Graves ds. Last visit 1 year ago. She will see DR. Ronnald Ramp.  Interim history: In 2021, she complained of a right low cervical nodule-on palpation, this appeared to be a small cervical lymph node.  She is not bothered by this anymore. No  palpitations.  No unintentional weight loss. She has chronic tremors. She walks a mile every day.  Reviewed her TFTs: Lab Results  Component Value Date   TSH 2.73 04/16/2022   TSH 4.40 11/27/2021   TSH 5.86 (H) 01/30/2021   TSH 4.63 (H) 10/20/2020   TSH 4.18 10/13/2019   TSH 2.32 07/07/2018   TSH 2.07 08/30/2017   TSH 2.01 08/30/2016   TSH 3.08 04/06/2016   TSH 2.20 01/04/2016   FREET4 0.97 04/16/2022   FREET4 0.74 01/30/2021   FREET4 0.86 07/07/2018   FREET4 0.81 08/30/2017   FREET4 0.79 08/30/2016   FREET4 0.77 04/06/2016   FREET4 0.79 01/04/2016   FREET4 0.62 11/16/2015   FREET4 1.02 09/29/2015   FREET4 0.9 08/05/2008    Her TSI levels remain high: Lab Results  Component Value Date   TSI 442 (H) 07/07/2018   TSI 524 (H) 04/06/2016   TSI 531 (H) 09/29/2015   We diagnosed Graves' disease based on elevated TSI's and disease course.  We initially had her on methimazole, decreased to 2.5 mg daily.  She stopped this by herself in 02/2016.  She was also on atenolol in the past, which she stopped.  Pt denies: - feeling nodules in neck - hoarseness - dysphagia - choking  Pt does have a FH of thyroid ds.: her mother. No FH of thyroid cancer. No h/o radiation tx to head or neck. No herbal supplements. No Biotin use. No recent steroids use.   She has COPD. Also, OP - on Prolia.  Her first husband had diabetes.  Both of her children had type 1 diabetes and her son died with It.  Her daughter had pancreatic-kidney transplant and she is doing very well.  ROS: + see HPI  I reviewed pt's  medications, allergies, PMH, social hx, family hx, and changes were documented in the history of present illness. Otherwise, unchanged from my initial visit note.  Past Medical History:  Diagnosis Date   Anxiety    Arthritis    Asthma    Bronchitis    hx of    Chronic headaches    Headache Clinic   COVID-19    Cystitis    1954   Depression    Ear pain    bilat    Hiatal hernia    History of blood clots    Lungs, per [place] new patient packet    History of blood transfusion    2010   Hx of blood clots 2007 & 2010   post TKR   Hyperthyroidism    Pelvic fracture (HCC)    hx of in three places secondary to fall    Pneumonia    hx of 1952   Pulmonary embolism ([place]) 1993   on HRT   Right wrist fracture    hx of 1995 has plate secondary to fall    Shaking    hands bilat   Shingles     per [facility] new patient packet    Shortness of breath dyspnea    Skin cancer  basal cell    Spinal stenosis    Trauma    left lower leg 10/22/2014    Urinary frequency    Urticaria    Past Surgical History:  Procedure Laterality Date   ABDOMINAL HYSTERECTOMY  1969    per [facility] new patient packet    [place]    per [facility] new patient packet    COLONOSCOPY     diverticulosis   ESI   11/2013    C7-T1 ; Dr Nelva Bush   EYE SURGERY     bilat cataract surgery    INSERTION OF MESH N/A 07/08/2015   Procedure: INSERTION OF MESH;  Surgeon: Armandina Gemma, MD;  Location: [facility];  Service: General;  Laterality: N/A;   REPLACEMENT TOTAL KNEE BILATERAL  2007 & 2010   SP FACET INJECTION  01/19/14   RC2-3,3-4,C4-5   TONSILLECTOMY  1945    per [facility] new patient packet    VENTRAL HERNIA REPAIR N/A 07/08/2015   Procedure: VENTRAL ADULT REPAIR VENTRAL INCISIONAL HERNIA REPAIR;  Surgeon: Armandina Gemma, MD;  Location: [facility];  Service: General;  Laterality: N/A;   WRIST FRACTURE SURGERY Bilateral 10  years and 2 years/   WRIST SURGERY Left 06/2016   plate     Social History   Socioeconomic History   Marital status: Married    Spouse name: Not on file   Number of children: 3   Years of education: Not on file   Highest education level: Not on file  Occupational History   Occupation: retired  Tobacco Use   Smoking status: Never   Smokeless tobacco: Never  Vaping Use   Vaping Use: Never used  Substance and Sexual Activity   Alcohol use: Yes    Alcohol/week: 4.0 standard drinks of alcohol    Types: 4 Glasses of wine per week    Comment: minimal   Drug use: No   Sexual activity: Not Currently  Other Topics Concern   Not on file  Social History Narrative   Per [place] Patient Packet, abstracted on 06/06/2020:      Diet: Keto Diet x 1 month, no red meat       Caffeine: Yes, coffee      Married, if yes what year: Yes, 1990 (last, 3rd)      Do you live in a house, apartment, assisted living, condo, trailer, ect: Hose      Is it one or more stories: 3 stories, 1 person       Pets: 1 Neurosurgeon       Current/Past profession: Gaffer, 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 No   Do you have difficulty preparing food or eating? No   Do you have difficulty managing your medications? No   Do you have difficulty managing your finances? No   Do you have difficulty affording your medications? No   Social Determinants of Health   Financial Resource Strain: Low Risk  (07/18/2018)  Overall Financial Resource Strain (CARDIA)    Difficulty of Paying Living Expenses: Not hard at all  Food Insecurity: No Food Insecurity (07/18/2018)   Hunger Vital Sign    Worried About Running Out of Food in the Last Year: Never true    Ran Out of Food in the Last Year: Never true   Transportation Needs: No Transportation Needs (07/18/2018)   PRAPARE - Hydrologist (Medical): No    Lack of Transportation (Non-Medical): No  Physical Activity: Unknown (08/26/2019)   Exercise Vital Sign    Days of Exercise per Week: 5 days    Minutes of Exercise per Session: Not on file  Stress: No Stress Concern Present (07/18/2018)   [place]    Feeling of Stress : Only a little  Social Connections: Socially Integrated (07/18/2018)   Social Connection and Isolation Panel [NHANES]    Frequency of Communication with Friends and Family: More than three times a week    Frequency of Social Gatherings with Friends and Family: More than three times a week    Attends Religious Services: More than 4 times per year    Active Member of [employer] or Organizations: Yes    Attends Music therapist: More than 4 times per year    Marital Status: Married  Human resources officer Violence: Not At Risk (07/18/2018)   Humiliation, Afraid, Rape, and Kick questionnaire    Fear of Current or Ex-Partner: No    Emotionally Abused: No    Physically Abused: No    Sexually Abused: No   Current Outpatient Medications on File Prior to Visit  Medication Sig Dispense Refill   ascorbic acid (VITAMIN C) 500 MG tablet Take 500 mg by mouth daily.     atorvastatin (LIPITOR) 20 MG tablet TAKE 1 TABLET(20 MG) BY MOUTH DAILY 90 tablet 3   cholecalciferol (VITAMIN D) 1000 UNITS tablet Take 1,000 Units by mouth daily.     Cyanocobalamin (B-12) 3000 MCG SUBL Place 1 tablet under the tongue daily.     denosumab (PROLIA) 60 MG/ML SOSY injection Inject 60 mg into the skin every 6 (six) months.     DULoxetine (CYMBALTA) 30 MG capsule Take 30 mg by mouth daily.     hydrocortisone (ANUSOL-HC) 2.5 % rectal cream Place 1 Application rectally 2 (two) times daily as needed for hemorrhoids or anal itching. 30 g 1   No current  facility-administered medications on file prior to visit.   Allergies  Allergen Reactions   Penicillins Hives   Sulfa Antibiotics Hives and Nausea And Vomiting    7-15   Family History  Problem Relation Age of Onset   Skin cancer Mother    Pneumonia Mother        1/2 lung loss per [facility] New Patient Packet    Clotting disorder Father        PTE X 2   Skin cancer Father    Heart failure Father        Per [place] New Patient Packet    Allergies Brother    Asthma Brother    Heart attack Brother        > 52   Bladder Cancer Brother    Diabetes Daughter    Migraines Daughter    Heart attack Daughter    Breast cancer Daughter    Schizophrenia Son        Per [facility] New Patient Packet  Lung disease Son        Per [place] New Patient Packet    Mental illness Son        suicide by hanging   Diabetes Son    Hepatitis C Son    Sudden death Other        2 M aunts & 2 M uncles in 21s   Stroke Neg Hx    PE: BP 118/78 (BP Location: Left Arm, Patient Position: Sitting, Cuff Size: Normal)   Pulse 94   Ht 5' 5"$  (1.651 m)   Wt 167 lb (75.8 kg)   SpO2 97%   BMI 27.79 kg/m   Wt Readings from Last 3 Encounters:  02/14/23 167 lb (75.8 kg)  12/27/22 169 lb (76.7 kg)  12/20/22 169 lb (76.7 kg)   Constitutional: normal weight, in NAD Eyes: EOMI, no exophthalmos ENT: no thyromegaly, no cervical lymphadenopathy Cardiovascular: RRR, No MRG Respiratory: CTA B Musculoskeletal: no deformities Skin: moist, warm, no rashes Neurological: + tremor with outstretched hands  ASSESSMENT: 1. Graves ds.  2. Neck nodule  PLAN:  1. Patient with history of Graves' disease diagnosed in the past based on elevated TSI antibodies -She was previously on methimazole but was able to stop in 01/2026 with subsequently normal TFTs -She did have a slightly high TSH with normal free thyroid hormones in 2021 and 2022, however, I did not suggest intervention at that time and TFTs normalized afterwards. -Latest TSH was  reviewed from 03/2022 and this was normal -We will recheck her TFTs today -She does not have overt thyrotoxic symptoms but she does have chronic fatigue, tremors and she continues to have ataxia, and falls. -We discussed about how to take levothyroxine correctly, in case we need to start: Every day, with water, at least 30 minutes before breakfast, separated by at least 4 hours from: - acid reflux medications - calcium - iron - multivitamins -I will see her back in 1 year, but sooner for labs if needed  2.  Neck nodule -She has a history of a right inferior cervical lymph node on palpation -We did not check a thyroid ultrasound but I did advise her to let me know if this continues to enlarge and at last visit - this is not palpable anymore and she is not bothered by it. -She had a modified barium swallow study in 10/2021 which showed prominent cricopharyngeus, no external compression on the esophagus -No further investigation is needed for this   Component     Latest Ref Rng 02/14/2023  Triiodothyronine,Free,Serum     2.3 - 4.2 pg/mL 3.0   T4,Free(Direct)     0.60 - 1.60 ng/dL 0.74   TSH     0.35 - 5.50 uIU/mL 2.89   Normal TFTs.  Philemon Kingdom, MD PhD [facility]

## 2023-04-05 DIAGNOSIS — R300 Dysuria: Secondary | ICD-10-CM | POA: Diagnosis not present

## 2023-04-23 DIAGNOSIS — D225 Melanocytic nevi of trunk: Secondary | ICD-10-CM | POA: Diagnosis not present

## 2023-04-23 DIAGNOSIS — L821 Other seborrheic keratosis: Secondary | ICD-10-CM | POA: Diagnosis not present

## 2023-04-23 DIAGNOSIS — L570 Actinic keratosis: Secondary | ICD-10-CM | POA: Diagnosis not present

## 2023-04-23 DIAGNOSIS — L84 Corns and callosities: Secondary | ICD-10-CM | POA: Diagnosis not present

## 2023-04-23 DIAGNOSIS — L853 Xerosis cutis: Secondary | ICD-10-CM | POA: Diagnosis not present

## 2023-04-23 DIAGNOSIS — L814 Other melanin hyperpigmentation: Secondary | ICD-10-CM | POA: Diagnosis not present

## 2023-05-15 ENCOUNTER — Ambulatory Visit (INDEPENDENT_AMBULATORY_CARE_PROVIDER_SITE_OTHER): Payer: Medicare Other | Admitting: Nurse Practitioner

## 2023-05-15 ENCOUNTER — Encounter: Payer: Self-pay | Admitting: Nurse Practitioner

## 2023-05-15 VITALS — BP 134/78 | HR 92 | Temp 97.5°F | Ht 65.0 in | Wt 167.0 lb

## 2023-05-15 DIAGNOSIS — M25551 Pain in right hip: Secondary | ICD-10-CM | POA: Diagnosis not present

## 2023-05-15 NOTE — Progress Notes (Signed)
Careteam: Patient Care Team: Sharon Seller, NP as PCP - General (Geriatric Medicine) Nyoka Cowden, MD as Consulting Physician (Pulmonary Disease) Blima Ledger, OD (Optometry) Archer Asa, MD as Consulting Physician (Psychiatry) Carlus Pavlov, MD as Consulting Physician (Internal Medicine) Cleotis Nipper, MD as Referring Physician (Orthopedic Surgery) Irena Cords, Enzo Montgomery, MD as Consulting Physician (Allergy and Immunology) Meryl Dare, MD as Consulting Physician (Gastroenterology) Delfin Gant, MD as Attending Physician (Family Medicine) Christia Reading, MD as Consulting Physician (Otolaryngology)  PLACE OF SERVICE:  [facility]  Advanced Directive information    Allergies  Allergen Reactions   Penicillins Hives   Sulfa Antibiotics Hives and Nausea And Vomiting    7-15    Chief Complaint  Patient presents with   Acute Visit    Groin pain x 1 month, pain is more noticeable when patient goes from sitting down to getting up. Patient waited thinking pain would go away      HPI: Patient is a 89 y.o. female due to groin pain.  Pain started over a month ago. Reports pain to right groin. She will sit and then stand up and start limping on right side.  Reports she walks a mile every day and does fine.  Now she is not having any pain and no pain today.  Pain is 10-10.  Does not take anything when she has the pain.  She will walk and that will make the pain better.    Review of Systems:  Review of Systems  Constitutional:  Negative for chills, fever and malaise/fatigue.  Musculoskeletal:  Positive for joint pain. Negative for back pain, falls, myalgias and neck pain.    Past Medical History:  Diagnosis Date   Anxiety    Arthritis    Asthma    Bronchitis    hx of    Chronic headaches    [facility]   COVID-19    Cystitis    1954   Depression    Ear pain    bilat    Hiatal hernia    History of blood clots    Lungs, per [facility] new  patient packet    History of blood transfusion    2010   Hx of blood clots 2007 & 2010   post TKR   Hyperthyroidism    Pelvic fracture (HCC)    hx of in three places secondary to fall    Pneumonia    hx of 1952   Pulmonary embolism (HCC) 1993   on HRT   Right wrist fracture    hx of 1995 has plate secondary to fall    Shaking    hands bilat   Shingles     per [facility] new patient packet    Shortness of breath dyspnea    Skin cancer    basal cell    Spinal stenosis    Trauma    left lower leg 10/22/2014    Urinary frequency    Urticaria    Past Surgical History:  Procedure Laterality Date   ABDOMINAL HYSTERECTOMY  1969    per [facility] new patient packet    ADENOIDECTOMY     BREAST ENHANCEMENT SURGERY  1985   CHOLECYSTECTOMY  1992    per [facility] new patient packet    COLONOSCOPY     diverticulosis   ESI   11/2013    C7-T1 ; Dr Ethelene Hal   EYE SURGERY     bilat cataract surgery  INSERTION OF MESH N/A 07/08/2015   Procedure: INSERTION OF MESH;  Surgeon: Darnell Level, MD;  Location: [facility] ORS;  Service: General;  Laterality: N/A;   REPLACEMENT TOTAL KNEE BILATERAL  2007 & 2010   SP FACET INJECTION  01/19/14   RC2-3,3-4,C4-5   TONSILLECTOMY  1945    per [facility] new patient packet    VENTRAL HERNIA REPAIR N/A 07/08/2015   Procedure: VENTRAL ADULT REPAIR VENTRAL INCISIONAL HERNIA REPAIR;  Surgeon: Darnell Level, MD;  Location: [facility]S;  Service: General;  Laterality: N/A;   WRIST FRACTURE SURGERY Bilateral 10  years and 2 years/   WRIST SURGERY Left 06/2016   plate    Social History:   reports that she has never smoked. She has never used smokeless tobacco. She reports current alcohol use of about 1.0 standard drink of alcohol per week. She reports that she does not use drugs.  Family History  Problem Relation Age of Onset   Skin cancer Mother    Pneumonia Mother        1/2 lung loss per [facility] New Patient Packet    Clotting disorder Father        PTE X 2   Skin cancer Father    Heart failure  Father        Per [facility] New Patient Packet    Allergies Brother    Asthma Brother    Heart attack Brother        > 55   Bladder Cancer Brother    Diabetes Daughter    Migraines Daughter    Heart attack Daughter    Breast cancer Daughter    Schizophrenia Son        Per [facility] New Patient Packet    Lung disease Son        Per [facility] New Patient Packet    Mental illness Son        suicide by hanging   Diabetes Son    Hepatitis C Son    Sudden death Other        2 M aunts & 2 M uncles in 48s   Stroke Neg Hx     Medications: Patient's Medications  New Prescriptions   No medications on file  Previous Medications   ASCORBIC ACID (VITAMIN C) 500 MG TABLET    Take 500 mg by mouth daily.   ATORVASTATIN (LIPITOR) 20 MG TABLET    TAKE 1 TABLET(20 MG) BY MOUTH DAILY   CHOLECALCIFEROL (VITAMIN D) 1000 UNITS TABLET    Take 1,000 Units by mouth daily.   CYANOCOBALAMIN (B-12) 3000 MCG SUBL    Place 1 tablet under the tongue daily.   DENOSUMAB (PROLIA) 60 MG/ML SOSY INJECTION    Inject 60 mg into the skin every 6 (six) months.   DULOXETINE (CYMBALTA) 30 MG CAPSULE    Take 30 mg by mouth daily.   HYDROCORTISONE (ANUSOL-HC) 2.5 % RECTAL CREAM    Place 1 Application rectally 2 (two) times daily as needed for hemorrhoids or anal itching.  Modified Medications   No medications on file  Discontinued Medications   No medications on file    Physical Exam:  Vitals:   05/15/23 1339 05/15/23 1341  BP: (!) 142/80 134/78  Pulse: 92   Temp: (!) 97.5 F (36.4 C)   TempSrc: Temporal   SpO2: 96%   Weight: 167 lb (75.8 kg)   Height: 5\' 5"  (1.651 m)    Body mass index is 27.79 kg/m. Wt Readings from  Last 3 Encounters:  05/15/23 167 lb (75.8 kg)  02/14/23 167 lb (75.8 kg)  12/27/22 169 lb (76.7 kg)    Physical Exam Constitutional:      Appearance: Normal appearance.  Pulmonary:     Effort: Pulmonary effort is normal.  Musculoskeletal:        General: Normal range of motion.     Right hip:  Tenderness (with movement) present. Normal range of motion.     Left hip: No tenderness. Normal range of motion.  Neurological:     Mental Status: She is alert. Mental status is at baseline.  Psychiatric:        Mood and Affect: Mood normal.     Labs reviewed: Basic Metabolic Panel: Recent Labs    11/15/22 1438 02/14/23 1417  NA 135  --   K 5.1  --   CL 103  --   CO2 25  --   GLUCOSE 95  --   BUN 41*  --   CREATININE 1.19  --   CALCIUM 9.2  --   TSH  --  2.89   Liver Function Tests: Recent Labs    11/15/22 1438  AST 25  ALT 17  ALKPHOS 84  BILITOT 0.6  PROT 6.8  ALBUMIN 4.1   No results for input(s): "LIPASE", "AMYLASE" in the last 8760 hours. No results for input(s): "AMMONIA" in the last 8760 hours. CBC: Recent Labs    11/15/22 1438  WBC 7.1  HGB 13.2  HCT 39.1  MCV 96.1  PLT 269.0   Lipid Panel: Recent Labs    12/28/22 1432  CHOL 166  HDL 68  LDLCALC 80  TRIG 95  CHOLHDL 2.4   TSH: Recent Labs    02/14/23 1417  TSH 2.89   A1C: Lab Results  Component Value Date   HGBA1C 5.6 01/05/2021     Assessment/Plan .1. Right hip pain -suspect OA of hip, will get xray at this time -can use tylenol 1000 mg by mouth every 8 hours as needed pain - DG Hip Unilat W OR W/O Pelvis 2-3 Views Right; Future   Return in about 3 months (around 08/15/2023) for routine follow up .  Janene Harvey. Biagio Borg [facility] Adult Medicine [phone]

## 2023-05-16 ENCOUNTER — Ambulatory Visit
Admission: RE | Admit: 2023-05-16 | Discharge: 2023-05-16 | Disposition: A | Discharge status: Home / Self Care | Payer: Medicare Other | Source: Ambulatory Visit | Attending: Nurse Practitioner | Admitting: Nurse Practitioner

## 2023-05-16 DIAGNOSIS — M25551 Pain in right hip: Secondary | ICD-10-CM

## 2023-05-17 ENCOUNTER — Telehealth: Payer: Self-pay

## 2023-05-17 NOTE — Telephone Encounter (Signed)
Patient would like x-ray results from yesterday.  

## 2023-05-17 NOTE — Telephone Encounter (Signed)
I left a detailed message informing patient that her message was received and I've been waiting to see if results would populated and as of right now they have not

## 2023-05-17 NOTE — Telephone Encounter (Signed)
No results yet for review.

## 2023-05-20 NOTE — Telephone Encounter (Signed)
Results are not yet available. Will forward message to Sharon Seller, NP and clinical intake assistant for today, Elveria Royals, CMA

## 2023-05-21 ENCOUNTER — Other Ambulatory Visit: Payer: Self-pay | Admitting: Nurse Practitioner

## 2023-05-21 DIAGNOSIS — M25551 Pain in right hip: Secondary | ICD-10-CM

## 2023-05-29 ENCOUNTER — Ambulatory Visit (INDEPENDENT_AMBULATORY_CARE_PROVIDER_SITE_OTHER): Payer: Medicare Other | Admitting: Gastroenterology

## 2023-05-29 ENCOUNTER — Encounter: Payer: Self-pay | Admitting: Gastroenterology

## 2023-05-29 VITALS — BP 110/76 | HR 88 | Ht 65.0 in | Wt 162.0 lb

## 2023-05-29 DIAGNOSIS — K5900 Constipation, unspecified: Secondary | ICD-10-CM

## 2023-05-29 DIAGNOSIS — R197 Diarrhea, unspecified: Secondary | ICD-10-CM

## 2023-05-29 DIAGNOSIS — K625 Hemorrhage of anus and rectum: Secondary | ICD-10-CM | POA: Diagnosis not present

## 2023-05-29 NOTE — Patient Instructions (Signed)
Discontinue milk of magnesia.   Take over the counter Miralax 3-4 times a day for constipation.  You can also you preparation H suppositories and coat them in over the counter hydrocortisone cream twice daily x 5 days then as needed.   The [facility] providers would like to encourage you to use [facility] to communicate with providers for non-urgent requests or questions.  Due to long hold times on the telephone, sending your provider a message by [facility] may be a faster and more efficient way to get a response.  Please allow 48 business hours for a response.  Please remember that this is for non-urgent requests.   Thank you for choosing me and [facility].  Venita Lick. Pleas Koch., MD., Clementeen Graham

## 2023-05-29 NOTE — Progress Notes (Signed)
    Assessment     Rectal bleeding likely from Grade II internal hemorrhoids Diarrhea from MOM or acute infection Chronic constipation GERD, well-controlled on diet   Recommendations    Prep H supp coated with 1% hydrocortisone cream PR bid for 5 days and then bid prn hemorrhoid symptoms DC MOM Miralax tid prn after diarrhea resolves If rectal bleeding and diarrhea do not resolve in next 2-3 days contact us to proceed with blood work and stool studies No plans for future screening colonoscopies Return to PCP for ongoing care. GI follow up on referral from PCP   HPI    This is an 89 year old female with rectal bleeding and diarrhea since taking MOM 3T last week.  She notes loose urgent diarrhea associated with bright and dark red rectal bleeding for the past several days.  Symptoms have improved over the past day or two.  She has slight abdominal cramping prior to a bowel movement.  She states she felt she took too much MOM.  No other gastrointestinal complaints. Denies fevers, chills, weight loss, change in stool caliber, melena, nausea, vomiting, dysphagia, reflux symptoms, chest pain.  Anoscopy Nov 2023: Grade 2 erythematous internal hemorrhoids   Labs / Imaging       Latest Ref Rng & Units 11/15/2022    2:38 PM 04/16/2022    3:41 PM 09/27/2021   12:44 PM  Hepatic Function  Total Protein 6.0 - 8.3 g/dL 6.8  7.0  7.0   Albumin 3.5 - 5.2 g/dL 4.1  4.1  4.1   AST 0 - 37 U/L 25  18  27    ALT 0 - 35 U/L 17  14  25    Alk Phosphatase 39 - 117 U/L 84  69  61   Total Bilirubin 0.2 - 1.2 mg/dL 0.6  0.7  0.9        Latest Ref Rng & Units 11/15/2022    2:38 PM 04/16/2022    3:41 PM 10/03/2021   11:19 AM  CBC  WBC 4.0 - 10.5 K/uL 7.1  7.7  6.8   Hemoglobin 12.0 - 15.0 g/dL 16.1  09.6  04.5   Hematocrit 36.0 - 46.0 % 39.1  37.6  39.1   Platelets 150.0 - 400.0 K/uL 269.0  243.0  265    Current Medications, Allergies, Past Medical History, Past Surgical History, Family History and  Social History were reviewed in [employer] record.   Physical Exam: General: Well developed, well nourished, no acute distress Head: Normocephalic and atraumatic Eyes: Sclerae anicteric, EOMI Ears: Normal auditory acuity Mouth: No deformities or lesions noted Lungs: Clear throughout to auscultation Heart: Regular rate and rhythm; No murmurs, rubs or bruits Abdomen: Soft, non tender and non distended. No masses, hepatosplenomegaly or hernias noted. Normal Bowel sounds Rectal: No external lesions, no tenderness, heme negative light brown soft stool in vault  Musculoskeletal: Symmetrical with no gross deformities  Pulses:  Normal pulses noted Extremities: No edema or deformities noted Neurological: Alert oriented x 4, grossly nonfocal Psychological:  Alert and cooperative. Normal mood and affect   Lashaun Poch T. Russella Dar, MD 05/29/2023, 1:44 PM

## 2023-06-04 ENCOUNTER — Ambulatory Visit: Payer: Medicare Other | Admitting: Orthopaedic Surgery

## 2023-07-01 ENCOUNTER — Ambulatory Visit (INDEPENDENT_AMBULATORY_CARE_PROVIDER_SITE_OTHER): Payer: Medicare Other

## 2023-07-01 DIAGNOSIS — M810 Age-related osteoporosis without current pathological fracture: Secondary | ICD-10-CM | POA: Diagnosis not present

## 2023-07-01 MED ORDER — DENOSUMAB 60 MG/ML ~~LOC~~ SOSY
60.0000 mg | PREFILLED_SYRINGE | Freq: Once | SUBCUTANEOUS | Status: AC
Start: 2023-07-01 — End: 2023-07-01
  Administered 2023-07-01: 60 mg via SUBCUTANEOUS

## 2023-07-29 DIAGNOSIS — F3181 Bipolar II disorder: Secondary | ICD-10-CM | POA: Diagnosis not present

## 2023-08-19 ENCOUNTER — Encounter: Payer: Self-pay | Admitting: Nurse Practitioner

## 2023-08-19 ENCOUNTER — Ambulatory Visit (INDEPENDENT_AMBULATORY_CARE_PROVIDER_SITE_OTHER): Payer: Medicare Other | Admitting: Nurse Practitioner

## 2023-08-19 VITALS — BP 118/70 | HR 102 | Temp 93.3°F | Ht 65.0 in | Wt 166.6 lb

## 2023-08-19 DIAGNOSIS — K5904 Chronic idiopathic constipation: Secondary | ICD-10-CM

## 2023-08-19 DIAGNOSIS — R413 Other amnesia: Secondary | ICD-10-CM | POA: Diagnosis not present

## 2023-08-19 DIAGNOSIS — F339 Major depressive disorder, recurrent, unspecified: Secondary | ICD-10-CM | POA: Diagnosis not present

## 2023-08-19 DIAGNOSIS — N3281 Overactive bladder: Secondary | ICD-10-CM

## 2023-08-19 DIAGNOSIS — E7849 Other hyperlipidemia: Secondary | ICD-10-CM | POA: Diagnosis not present

## 2023-08-19 DIAGNOSIS — N1831 Chronic kidney disease, stage 3a: Secondary | ICD-10-CM | POA: Diagnosis not present

## 2023-08-19 MED ORDER — MIRABEGRON ER 25 MG PO TB24
25.0000 mg | ORAL_TABLET | Freq: Every day | ORAL | 1 refills | Status: DC
Start: 2023-08-19 — End: 2023-10-28

## 2023-08-19 NOTE — Patient Instructions (Addendum)
RECOMMENDATIONS FOR ALL PATIENTS WITH MEMORY PROBLEMS: 1. Continue to exercise (Recommend 30 minutes of walking everyday, or 3 hours every week) 2. Increase social interactions - continue going to [place] and enjoy social gatherings with friends and family 3. Eat healthy, avoid fried foods and eat more fruits and vegetables 4. Maintain adequate blood pressure, blood sugar, and blood cholesterol level. Reducing the risk of stroke and cardiovascular disease also helps promoting better memory. 5. Avoid stressful situations. Live a simple life and avoid aggravations. 6. Organize your time and prepare for the next day in anticipation. 7. Sleep well, avoid any interruptions of sleep and avoid any distractions in the bedroom that may interfere with adequate sleep quality 8. Avoid sugar, avoid sweets as there is a strong link between excessive sugar intake, diabetes, and cognitive impairment 9. Mediterranean diet, which has been shown to help patients reduce the risk of progressive memory disorders and reduces cardiovascular risk. This includes eating fish, eat fruits and green leafy vegetables, nuts like almonds and hazelnuts, walnuts, and also use olive oil. Avoid fast foods and fried foods as much as possible. Avoid sweets and sugar as sugar use has been linked to worsening of memory function.   To call and make appt for bone density- [phone]   To start myrbetriq 25 mg by mouth daily to help with overactive bladder- will take up to 4 weeks to help.

## 2023-08-19 NOTE — Progress Notes (Signed)
Careteam: Patient Care Team: Sharon Seller, NP as PCP - General (Geriatric Medicine) Nyoka Cowden, MD as Consulting Physician (Pulmonary Disease) Blima Ledger, OD (Optometry) Archer Asa, MD as Consulting Physician (Psychiatry) Carlus Pavlov, MD as Consulting Physician (Internal Medicine) Cleotis Nipper, MD as Referring Physician (Orthopedic Surgery) Irena Cords, Enzo Montgomery, MD as Consulting Physician (Allergy and Immunology) Meryl Dare, MD as Consulting Physician (Gastroenterology) Delfin Gant, MD as Attending Physician (Family Medicine) Christia Reading, MD as Consulting Physician (Otolaryngology)  PLACE OF SERVICE:  [facility] CLINIC  Advanced Directive information Does Patient Have a Medical Advance Directive?: No, Would patient like information on creating a medical advance directive?: No - Patient declined  Allergies  Allergen Reactions   Penicillins Hives   Sulfa Antibiotics Hives and Nausea And Vomiting    7-15    Chief Complaint  Patient presents with   Medical Management of Chronic Issues    3 month follow-up. DIscuss need for flu vaccine and covid boosters. Discuss memory.      HPI: Patient is a 89 y.o. female for routine follow up.   Her memory is getting worse. Writing things down to keep up Can not remember names  Reports she has noticed more memory loss after her fall 3 years ago.   Bowels are constipated at time but able to manages.   Has trouble sleeping, can not nap during the day Has to urinate about 3-4 times at night.  Has to wear depends because of leakage.   Sees Dr Donell Beers for her bipolar disorder .  Osteoporosis- on prolia every 6 months.   No pain at this time.   She is not taking her medication for her cholesterol.   She has stop drinking ETOH  Review of Systems:  Review of Systems  Constitutional:  Negative for chills, fever and weight loss.  HENT:  Negative for tinnitus.   Respiratory:  Negative for cough,  sputum production and shortness of breath.   Cardiovascular:  Negative for chest pain, palpitations and leg swelling.  Gastrointestinal:  Negative for abdominal pain, constipation, diarrhea and heartburn.  Genitourinary:  Negative for dysuria, frequency and urgency.  Musculoskeletal:  Negative for back pain, falls, joint pain and myalgias.  Skin: Negative.   Neurological:  Negative for dizziness and headaches.  Psychiatric/Behavioral:  Positive for memory loss. Negative for depression. The patient does not have insomnia.     Past Medical History:  Diagnosis Date   Anxiety    Arthritis    Asthma    Bronchitis    hx of    Chronic headaches    [facility]   COVID-19    Cystitis    1954   Depression    Ear pain    bilat    Hiatal hernia    History of blood clots    Lungs, per [facility] new patient packet    History of blood transfusion    2010   Hx of blood clots 2007 & 2010   post TKR   Hyperthyroidism    Pelvic fracture (HCC)    hx of in three places secondary to fall    Pneumonia    hx of 1952   Pulmonary embolism (HCC) 1993   on HRT   Right wrist fracture    hx of 1995 has plate secondary to fall    Shaking    hands bilat   Shingles     per [facility] new patient packet    Shortness  of breath dyspnea    Skin cancer    basal cell    Spinal stenosis    Trauma    left lower leg 10/22/2014    Urinary frequency    Urticaria    Past Surgical History:  Procedure Laterality Date   ABDOMINAL HYSTERECTOMY  1969    per [facility] new patient packet    ADENOIDECTOMY     BREAST ENHANCEMENT SURGERY  1985   CHOLECYSTECTOMY  1992    per [facility] new patient packet    COLONOSCOPY     diverticulosis   ESI   11/2013    C7-T1 ; Dr Ethelene Hal   EYE SURGERY     bilat cataract surgery    INSERTION OF MESH N/A 07/08/2015   Procedure: INSERTION OF MESH;  Surgeon: Darnell Level, MD;  Location: [facility] ORS;  Service: General;  Laterality: N/A;   REPLACEMENT TOTAL KNEE BILATERAL  2007 & 2010   SP FACET  INJECTION  01/19/14   RC2-3,3-4,C4-5   TONSILLECTOMY  1945    per [facility] new patient packet    VENTRAL HERNIA REPAIR N/A 07/08/2015   Procedure: VENTRAL ADULT REPAIR VENTRAL INCISIONAL HERNIA REPAIR;  Surgeon: Darnell Level, MD;  Location: [facility] ORS;  Service: General;  Laterality: N/A;   WRIST FRACTURE SURGERY Bilateral 10  years and 2 years/   WRIST SURGERY Left 06/2016   plate    Social History:   reports that she has never smoked. She has never used smokeless tobacco. She reports that she does not currently use alcohol after a past usage of about 1.0 standard drink of alcohol per week. She reports that she does not use drugs.  Family History  Problem Relation Age of Onset   Skin cancer Mother    Pneumonia Mother        1/2 lung loss per [facility] New Patient Packet    Clotting disorder Father        PTE X 2   Skin cancer Father    Heart failure Father        Per [facility] New Patient Packet    Allergies Brother    Asthma Brother    Heart attack Brother        > 55   Bladder Cancer Brother    Diabetes Daughter    Migraines Daughter    Heart attack Daughter    Breast cancer Daughter    Schizophrenia Son        Per [facility] New Patient Packet    Lung disease Son        Per [facility] New Patient Packet    Mental illness Son        suicide by hanging   Diabetes Son    Hepatitis C Son    Sudden death Other        2 M aunts & 2 M uncles in 22s   Stroke Neg Hx     Medications: Patient's Medications  New Prescriptions   No medications on file  Previous Medications   ASCORBIC ACID (VITAMIN C) 500 MG TABLET    Take 500 mg by mouth daily.   ATORVASTATIN (LIPITOR) 20 MG TABLET    TAKE 1 TABLET(20 MG) BY MOUTH DAILY   CHOLECALCIFEROL (VITAMIN D) 1000 UNITS TABLET    Take 1,000 Units by mouth daily.   CYANOCOBALAMIN (B-12) 3000 MCG SUBL    Place 1 tablet under the tongue daily.   DENOSUMAB (PROLIA) 60 MG/ML SOSY INJECTION    Inject  60 mg into the skin every 6 (six) months.   DULOXETINE (CYMBALTA) 30 MG  CAPSULE    Take 30 mg by mouth daily.   HYDROCORTISONE (ANUSOL-HC) 2.5 % RECTAL CREAM    Place 1 Application rectally 2 (two) times daily as needed for hemorrhoids or anal itching.  Modified Medications   No medications on file  Discontinued Medications   No medications on file    Physical Exam:  Vitals:   08/19/23 1125  BP: 118/70  Pulse: (!) 102  Temp: (!) 93.3 F (34.1 C)  TempSrc: Temporal  SpO2: 94%  Weight: 166 lb 9.6 oz (75.6 kg)  Height: 5\' 5"  (1.651 m)   Body mass index is 27.72 kg/m. Wt Readings from Last 3 Encounters:  08/19/23 166 lb 9.6 oz (75.6 kg)  05/29/23 162 lb (73.5 kg)  05/15/23 167 lb (75.8 kg)    Physical Exam Constitutional:      General: She is not in acute distress.    Appearance: She is well-developed. She is not diaphoretic.  HENT:     Head: Normocephalic and atraumatic.     Mouth/Throat:     Pharynx: No oropharyngeal exudate.  Eyes:     Conjunctiva/sclera: Conjunctivae normal.     Pupils: Pupils are equal, round, and reactive to light.  Cardiovascular:     Rate and Rhythm: Normal rate and regular rhythm.     Heart sounds: Normal heart sounds.  Pulmonary:     Effort: Pulmonary effort is normal.     Breath sounds: Normal breath sounds.  Abdominal:     General: Bowel sounds are normal.     Palpations: Abdomen is soft.  Musculoskeletal:     Cervical back: Normal range of motion and neck supple.     Right lower leg: No edema.     Left lower leg: No edema.  Skin:    General: Skin is warm and dry.  Neurological:     Mental Status: She is alert.  Psychiatric:        Mood and Affect: Mood normal.     Labs reviewed: Basic Metabolic Panel: Recent Labs    11/15/22 1438 02/14/23 1417  NA 135  --   K 5.1  --   CL 103  --   CO2 25  --   GLUCOSE 95  --   BUN 41*  --   CREATININE 1.19  --   CALCIUM 9.2  --   TSH  --  2.89   Liver Function Tests: Recent Labs    11/15/22 1438  AST 25  ALT 17  ALKPHOS 84  BILITOT 0.6  PROT  6.8  ALBUMIN 4.1   No results for input(s): "LIPASE", "AMYLASE" in the last 8760 hours. No results for input(s): "AMMONIA" in the last 8760 hours. CBC: Recent Labs    11/15/22 1438  WBC 7.1  HGB 13.2  HCT 39.1  MCV 96.1  PLT 269.0   Lipid Panel: Recent Labs    12/28/22 1432  CHOL 166  HDL 68  LDLCALC 80  TRIG 95  CHOLHDL 2.4   TSH: Recent Labs    02/14/23 1417  TSH 2.89   A1C: Lab Results  Component Value Date   HGBA1C 5.6 01/05/2021     Assessment/Plan 1. Memory loss -pt reports worsening memory loss however still very independent and highly functional wth ADLs  -will update MMSE at next week AWV - COMPLETE METABOLIC PANEL WITH GFR - CBC with Differential/Platelet - Vitamin B12 -  RPR - CT HEAD WO CONTRAST ( ); Future for further evaluation   2. Stage 3a chronic kidney disease (HCC) -Chronic and stable Encourage proper hydration Follow metabolic panel Avoid nephrotoxic meds (NSAIDS)  3. Other hyperlipidemia She decided to stop taking her lipitor.  - Lipid panel  4. Recurrent major depressive disorder, remission status unspecified (HCC) Doing well on cymbalta  5. Chronic idiopathic constipation Managed by diet  6. OAB (overactive bladder) -increase in frequent and incontinence.  Will start myrbetriq to help with frequency  - mirabegron ER (MYRBETRIQ) 25 MG TB24 tablet; Take 1 tablet (25 mg total) by mouth daily.  Dispense: 30 tablet; Refill: 1   Return in about 6 months (around 02/19/2024) for routine follow up .  Faith Edwards. Biagio Borg [facility] Adult Medicine [phone]

## 2023-08-20 ENCOUNTER — Telehealth: Payer: Self-pay

## 2023-08-20 LAB — CBC WITH DIFFERENTIAL/PLATELET
Absolute Monocytes: 1092 cells/uL — ABNORMAL HIGH (ref 200–950)
Basophils Absolute: 23 {cells}/uL (ref 0–200)
Basophils Relative: 0.3 %
Eosinophils Absolute: 94 {cells}/uL (ref 15–500)
Eosinophils Relative: 1.2 %
HCT: 41.4 % (ref 35.0–45.0)
Hemoglobin: 13.6 g/dL (ref 11.7–15.5)
Lymphs Abs: 1911 {cells}/uL (ref 850–3900)
MCH: 31.4 pg (ref 27.0–33.0)
MCHC: 32.9 g/dL (ref 32.0–36.0)
MCV: 95.6 fL (ref 80.0–100.0)
MPV: 10.1 fL (ref 7.5–12.5)
Monocytes Relative: 14 %
Neutro Abs: 4680 {cells}/uL (ref 1500–7800)
Neutrophils Relative %: 60 %
Platelets: 278 10*3/uL (ref 140–400)
RBC: 4.33 10*6/uL (ref 3.80–5.10)
RDW: 12.5 % (ref 11.0–15.0)
Total Lymphocyte: 24.5 %
WBC: 7.8 10*3/uL (ref 3.8–10.8)

## 2023-08-20 LAB — COMPLETE METABOLIC PANEL WITH GFR
AG Ratio: 1.5 (calc) (ref 1.0–2.5)
ALT: 21 U/L (ref 6–29)
AST: 26 U/L (ref 10–35)
Albumin: 4.1 g/dL (ref 3.6–5.1)
Alkaline phosphatase (APISO): 79 U/L (ref 37–153)
BUN/Creatinine Ratio: 21 (calc) (ref 6–22)
BUN: 23 mg/dL (ref 7–25)
CO2: 22 mmol/L (ref 20–32)
Calcium: 8.5 mg/dL — ABNORMAL LOW (ref 8.6–10.4)
Chloride: 106 mmol/L (ref 98–110)
Creat: 1.11 mg/dL — ABNORMAL HIGH (ref 0.60–0.95)
Globulin: 2.7 g/dL (ref 1.9–3.7)
Glucose, Bld: 104 mg/dL (ref 65–139)
Potassium: 4.8 mmol/L (ref 3.5–5.3)
Sodium: 137 mmol/L (ref 135–146)
Total Bilirubin: 0.6 mg/dL (ref 0.2–1.2)
Total Protein: 6.8 g/dL (ref 6.1–8.1)
eGFR: 48 mL/min/{1.73_m2} — ABNORMAL LOW (ref >=60)

## 2023-08-20 LAB — LIPID PANEL
Cholesterol: 216 mg/dL — ABNORMAL HIGH (ref <200)
HDL: 68 mg/dL (ref >=50)
LDL Cholesterol (Calc): 128 mg/dL — ABNORMAL HIGH
Non-HDL Cholesterol (Calc): 148 mg/dL — ABNORMAL HIGH (ref <130)
Total CHOL/HDL Ratio: 3.2 (calc) (ref <5.0)
Triglycerides: 95 mg/dL (ref <150)

## 2023-08-20 LAB — RPR: RPR Ser Ql: NONREACTIVE

## 2023-08-20 LAB — VITAMIN B12: Vitamin B-12: 531 pg/mL (ref 200–1100)

## 2023-08-20 MED ORDER — CALCIUM CARBONATE 600 MG PO TABS: 600.0000 mg | ORAL_TABLET | Freq: Two times a day (BID) | ORAL | Status: DC

## 2023-08-20 NOTE — Telephone Encounter (Signed)
I hate this for her, would have her start with clear liquids once her stomach starts to feel better then bland diet and advance as tolerates. If she is not able to keep down fluids would go to the ED

## 2023-08-20 NOTE — Telephone Encounter (Signed)
Discussed response with patient and she verbalized understanding  

## 2023-08-20 NOTE — Telephone Encounter (Signed)
Patient called to say she thinks she has "the stomach flu/bug." Patient c/o severe diarrhea (all over bed and floor), stomach pain (5/10), no appetite, and overall not feeling good. Patient denies vomiting and fever.   Please advise

## 2023-08-22 ENCOUNTER — Telehealth: Payer: Self-pay

## 2023-08-22 NOTE — Telephone Encounter (Signed)
Tried calling patient,but no answer. Left message for patient to call office.

## 2023-08-22 NOTE — Telephone Encounter (Signed)
Patient notified and verbalized her understanding.

## 2023-08-22 NOTE — Telephone Encounter (Signed)
Would not recommend taking medication for it at this time likely viral and diarrhea is the way of the body removing any toxins, would not be good to stop the process. Would have her drink 8-12 oz of electrolyte drink for every episode of diarrhea to avoid dehydration and electrolyte abnormalities with diarrhe

## 2023-08-22 NOTE — Telephone Encounter (Signed)
Patient called stating that she is having a lot of diarrhea. She would like to know if she can take anything for it like imodium. She states that she has been drinking clear liquids and eating a bland diet.  Message sent to Abbey Chatters, NP

## 2023-08-26 ENCOUNTER — Encounter: Payer: Medicare Other | Admitting: Nurse Practitioner

## 2023-10-27 ENCOUNTER — Other Ambulatory Visit: Payer: Self-pay | Admitting: Nurse Practitioner

## 2023-10-27 DIAGNOSIS — N3281 Overactive bladder: Secondary | ICD-10-CM

## 2023-11-06 ENCOUNTER — Other Ambulatory Visit: Payer: Self-pay | Admitting: Nurse Practitioner

## 2023-11-06 DIAGNOSIS — M810 Age-related osteoporosis without current pathological fracture: Secondary | ICD-10-CM

## 2023-11-06 MED ORDER — DENOSUMAB 60 MG/ML ~~LOC~~ SOSY
60.0000 mg | PREFILLED_SYRINGE | Freq: Once | SUBCUTANEOUS | Status: AC
Start: 2023-11-06 — End: 2024-02-21
  Administered 2024-02-21: 60 mg via SUBCUTANEOUS

## 2023-11-13 ENCOUNTER — Other Ambulatory Visit: Payer: Self-pay | Admitting: *Deleted

## 2023-11-14 ENCOUNTER — Telehealth: Payer: Medicare Other

## 2023-11-14 NOTE — Telephone Encounter (Signed)
Still awaiting CT scan results once received will determine the next step.

## 2023-11-14 NOTE — Telephone Encounter (Signed)
Patient spouse Baldo Ash is calling because he was wondering if he needed to call someone regarding to seeing someone face to face about wife memory. He states the wife has already had the CT scan for memory and would like to know the next steps.

## 2023-11-14 NOTE — Telephone Encounter (Signed)
Noted  

## 2023-11-14 NOTE — Telephone Encounter (Signed)
Attempted to call patient's husband. Left message on voicemail for patient to return call when available

## 2023-11-20 ENCOUNTER — Telehealth: Payer: Self-pay | Admitting: Nurse Practitioner

## 2023-11-20 NOTE — Telephone Encounter (Signed)
Patients husband Amrita Nuckolls called in asking if Shanda Bumps can please give him a call at her earliest convenience say that this is very important that he speak with her. Can be reached at Ph# [phone]

## 2023-11-20 NOTE — Telephone Encounter (Signed)
Sending msg to CI to get more information

## 2023-11-20 NOTE — Telephone Encounter (Signed)
Patient's husband just wanted to know what type of memory test Faith Edwards want the patient to have done and where did she need to go for the test. Baldo Ash was advised that from office note back in 08/2023 pt needed to have an AWV done so that she could have an MMSE completed in office. He is going to call back to schedule appointment after he speaks with his wife ad daughter at lives in [place] to schedule a day and time. Just a [place]

## 2023-11-22 ENCOUNTER — Ambulatory Visit: Payer: Medicare Other | Admitting: Nurse Practitioner

## 2023-12-12 ENCOUNTER — Telehealth: Payer: Self-pay | Admitting: *Deleted

## 2023-12-12 DIAGNOSIS — M810 Age-related osteoporosis without current pathological fracture: Secondary | ICD-10-CM

## 2023-12-12 NOTE — Telephone Encounter (Signed)
Tried to call patient to reschedule her Prolia Injection until AFTER 01/15/2024 due to new Year Verification Process.   LMOM to return call.

## 2023-12-26 MED ORDER — DENOSUMAB 60 MG/ML ~~LOC~~ SOSY
60.0000 mg | PREFILLED_SYRINGE | Freq: Once | SUBCUTANEOUS | Status: DC
Start: 2023-12-26 — End: 2024-09-02

## 2023-12-26 NOTE — Telephone Encounter (Signed)
Called and spoke with Patient.  Rescheduled Prolia Injection until 01/30/2024 due to [employer]   Last Prolia Injection: 07/01/2023  Order placed and deferred till 01/01/2024

## 2024-01-02 ENCOUNTER — Telehealth: Payer: Self-pay

## 2024-01-02 NOTE — Telephone Encounter (Signed)
 Patient is calling to check on her next prolia injection. Patient was informed about the appointment.

## 2024-01-03 ENCOUNTER — Ambulatory Visit: Payer: Medicare Other

## 2024-01-28 ENCOUNTER — Telehealth: Payer: Self-pay | Admitting: *Deleted

## 2024-01-28 DIAGNOSIS — N1831 Chronic kidney disease, stage 3a: Secondary | ICD-10-CM

## 2024-01-28 DIAGNOSIS — M810 Age-related osteoporosis without current pathological fracture: Secondary | ICD-10-CM

## 2024-01-28 NOTE — Telephone Encounter (Signed)
Lets have her follow up bmp prior to injection She should also be on calcium supplement

## 2024-01-28 NOTE — Telephone Encounter (Signed)
Patient notified and scheduled a Lab appointment for 1/29.  Patient is taking Calcium 600 twice daily.   Order placed and Released.

## 2024-01-28 NOTE — Telephone Encounter (Signed)
Patient has an appointment for Prolia Injection 01/30/2024  Labs done on 08/19/2023 Shows a low Calcium of 8.5  Is it ok for patient to receive Prolia Injection on 01/30/2024?  Please Advise.

## 2024-01-29 ENCOUNTER — Other Ambulatory Visit: Payer: Medicare Other

## 2024-01-29 DIAGNOSIS — N1831 Chronic kidney disease, stage 3a: Secondary | ICD-10-CM

## 2024-01-29 DIAGNOSIS — M810 Age-related osteoporosis without current pathological fracture: Secondary | ICD-10-CM

## 2024-01-30 ENCOUNTER — Other Ambulatory Visit: Payer: Medicare Other

## 2024-01-30 ENCOUNTER — Other Ambulatory Visit: Payer: Self-pay

## 2024-01-30 DIAGNOSIS — M810 Age-related osteoporosis without current pathological fracture: Secondary | ICD-10-CM

## 2024-02-06 NOTE — Telephone Encounter (Signed)
 Patient came in today and had her labwork done for Prolia . Has an appointment scheduled for 02/21/2024

## 2024-02-07 ENCOUNTER — Other Ambulatory Visit: Payer: Medicare Other

## 2024-02-07 DIAGNOSIS — M810 Age-related osteoporosis without current pathological fracture: Secondary | ICD-10-CM | POA: Diagnosis not present

## 2024-02-08 LAB — COMPLETE METABOLIC PANEL WITH GFR
AG Ratio: 1.6 (calc) (ref 1.0–2.5)
ALT: 19 U/L (ref 6–29)
AST: 19 U/L (ref 10–35)
Albumin: 4.2 g/dL (ref 3.6–5.1)
Alkaline phosphatase (APISO): 63 U/L (ref 37–153)
BUN/Creatinine Ratio: 19 (calc) (ref 6–22)
BUN: 21 mg/dL (ref 7–25)
CO2: 26 mmol/L (ref 20–32)
Calcium: 9.9 mg/dL (ref 8.6–10.4)
Chloride: 104 mmol/L (ref 98–110)
Creat: 1.08 mg/dL — ABNORMAL HIGH (ref 0.60–0.95)
Globulin: 2.7 g/dL (ref 1.9–3.7)
Glucose, Bld: 111 mg/dL — ABNORMAL HIGH (ref 65–99)
Potassium: 4.6 mmol/L (ref 3.5–5.3)
Sodium: 139 mmol/L (ref 135–146)
Total Bilirubin: 0.8 mg/dL (ref 0.2–1.2)
Total Protein: 6.9 g/dL (ref 6.1–8.1)
eGFR: 49 mL/min/{1.73_m2} — ABNORMAL LOW (ref >=60)

## 2024-02-10 ENCOUNTER — Encounter: Payer: Self-pay | Admitting: Nurse Practitioner

## 2024-02-17 ENCOUNTER — Ambulatory Visit: Payer: Medicare Other | Admitting: Internal Medicine

## 2024-02-17 NOTE — Progress Notes (Deleted)
 Patient ID: Faith Edwards, female   DOB: 11-16-1932, 89 y.o.   MRN: [id number]  HPI  Faith Edwards is a 89 y.o.-year-old female, returning for f/u for Graves ds. Last visit 1 year ago.  Interim history: In 2021, she complained of a right low cervical nodule-on palpation, this appeared to be a small cervical lymph node.  She is not bothered by this anymore. No  palpitations.  No unintentional weight loss. She has chronic tremors. She walks a mile every day.  Reviewed her TFTs: Lab Results  Component Value Date   TSH 2.89 02/14/2023   TSH 2.73 04/16/2022   TSH 4.40 11/27/2021   TSH 5.86 (H) 01/30/2021   TSH 4.63 (H) 10/20/2020   TSH 4.18 10/13/2019   TSH 2.32 07/07/2018   TSH 2.07 08/30/2017   TSH 2.01 08/30/2016   TSH 3.08 04/06/2016   FREET4 0.74 02/14/2023   FREET4 0.97 04/16/2022   FREET4 0.74 01/30/2021   FREET4 0.86 07/07/2018   FREET4 0.81 08/30/2017   FREET4 0.79 08/30/2016   FREET4 0.77 04/06/2016   FREET4 0.79 01/04/2016   FREET4 0.62 11/16/2015   FREET4 1.02 09/29/2015    Her TSI levels remain high: Lab Results  Component Value Date   TSI 442 (H) 07/07/2018   TSI 524 (H) 04/06/2016   TSI 531 (H) 09/29/2015   We diagnosed Graves' disease based on elevated TSI's and disease course.  We initially had her on methimazole, decreased to 2.5 mg daily.  She stopped this by herself in 02/2016.  She was also on atenolol in the past, which she stopped.  Pt denies: - feeling nodules in neck - hoarseness - dysphagia - choking  Pt does have a FH of thyroid ds.: her mother. No FH of thyroid cancer. No h/o radiation tx to head or neck. No herbal supplements. No Biotin use. No recent steroids use.   She has COPD. Also, OP - on Prolia.  Her first husband had diabetes.  Both of her children had type 1 diabetes and her son died with It.  Her daughter had pancreatic-kidney transplant and she is doing very well.  ROS: + see HPI  I reviewed pt's medications, allergies, PMH,  social hx, family hx, and changes were documented in the history of present illness. Otherwise, unchanged from my initial visit note.  Past Medical History:  Diagnosis Date   Anxiety    Arthritis    Asthma    Bronchitis    hx of    Chronic headaches    [facility]   COVID-19    Cystitis    1954   Depression    Ear pain    bilat    Hiatal hernia    History of blood clots    Lungs, per [facility] new patient packet    History of blood transfusion    2010   Hx of blood clots 2007 & 2010   post TKR   Hyperthyroidism    Pelvic fracture (HCC)    hx of in three places secondary to fall    Pneumonia    hx of 1952   Pulmonary embolism (HCC) 1993   on HRT   Right wrist fracture    hx of 1995 has plate secondary to fall    Shaking    hands bilat   Shingles     per [facility] new patient packet    Shortness of breath dyspnea    Skin cancer    basal cell  Spinal stenosis    Trauma    left lower leg 10/22/2014    Urinary frequency    Urticaria    Past Surgical History:  Procedure Laterality Date   ABDOMINAL HYSTERECTOMY  1969    per [facility] new patient packet    ADENOIDECTOMY     BREAST ENHANCEMENT SURGERY  1985   CHOLECYSTECTOMY  1992    per [facility] new patient packet    COLONOSCOPY     diverticulosis   ESI   11/2013    C7-T1 ; Dr Ethelene Hal   EYE SURGERY     bilat cataract surgery    INSERTION OF MESH N/A 07/08/2015   Procedure: INSERTION OF MESH;  Surgeon: Darnell Level, MD;  Location: [facility] ORS;  Service: General;  Laterality: N/A;   REPLACEMENT TOTAL KNEE BILATERAL  2007 & 2010   SP FACET INJECTION  01/19/14   RC2-3,3-4,C4-5   TONSILLECTOMY  1945    per [facility] new patient packet    VENTRAL HERNIA REPAIR N/A 07/08/2015   Procedure: VENTRAL ADULT REPAIR VENTRAL INCISIONAL HERNIA REPAIR;  Surgeon: Darnell Level, MD;  Location: [facility];  Service: General;  Laterality: N/A;   WRIST FRACTURE SURGERY Bilateral 10  years and 2 years/   WRIST SURGERY Left 06/2016   plate    Social History    Socioeconomic History   Marital status: Married    Spouse name: Not on file   Number of children: 3   Years of education: Not on file   Highest education level: Not on file  Occupational History   Occupation: retired  Tobacco Use   Smoking status: Never   Smokeless tobacco: Never  Vaping Use   Vaping status: Never Used  Substance and Sexual Activity   Alcohol use: Not Currently    Alcohol/week: 1.0 standard drink of alcohol    Types: 1 Glasses of wine per week   Drug use: Never   Sexual activity: Not Currently  Other Topics Concern   Not on file  Social History Narrative   Per [facility] New Patient Packet, abstracted on 06/06/2020:      Diet: Keto Diet x 1 month, no red meat       Caffeine: Yes, coffee      Married, if yes what year: Yes, 1990 (last, 3rd)      Do you live in a house, apartment, assisted living, condo, trailer, ect: Hose      Is it one or more stories: 3 stories, 1 person       Pets: 1 Medical laboratory scientific officer       Current/Past profession: Geographical information systems officer, 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 No   Do you have difficulty preparing food or eating? No   Do you have difficulty managing your medications? No   Do you have difficulty managing your finances? No   Do you have difficulty affording your medications? No   Social Drivers of Corporate investment banker Strain: Low Risk  (07/18/2018)   Overall Financial Resource Strain (CARDIA)    Difficulty  of Paying Living Expenses: Not hard at all  Food Insecurity: No Food Insecurity (07/18/2018)   Hunger Vital Sign    Worried About Running Out of Food in the Last Year: Never true    Ran Out of Food in the Last Year: Never true  Transportation Needs: No Transportation  Needs (07/18/2018)   PRAPARE - Administrator, Civil Service (Medical): No    Lack of Transportation (Non-Medical): No  Physical Activity: Unknown (08/26/2019)   Exercise Vital Sign    Days of Exercise per Week: 5 days    Minutes of Exercise per Session: Not on file  Stress: No Stress Concern Present (07/18/2018)   [employer] of Occupational Health - Occupational Stress Questionnaire    Feeling of Stress : Only a little  Social Connections: Socially Integrated (07/18/2018)   Social Connection and Isolation Panel [NHANES]    Frequency of Communication with Friends and Family: More than three times a week    Frequency of Social Gatherings with Friends and Family: More than three times a week    Attends Religious Services: More than 4 times per year    Active Member of [employer] or Organizations: Yes    Attends Engineer, structural: More than 4 times per year    Marital Status: Married  Catering manager Violence: Not At Risk (07/18/2018)   Humiliation, Afraid, Rape, and Kick questionnaire    Fear of Current or Ex-Partner: No    Emotionally Abused: No    Physically Abused: No    Sexually Abused: No   Current Outpatient Medications on File Prior to Visit  Medication Sig Dispense Refill   ascorbic acid (VITAMIN C) 500 MG tablet Take 500 mg by mouth daily.     atorvastatin (LIPITOR) 20 MG tablet TAKE 1 TABLET(20 MG) BY MOUTH DAILY 90 tablet 3   calcium carbonate (CALCIUM 600) 600 MG TABS tablet Take 1 tablet (600 mg total) by mouth 2 (two) times daily with a meal.     cholecalciferol (VITAMIN D) 1000 UNITS tablet Take 1,000 Units by mouth daily.     Cyanocobalamin (B-12) 3000 MCG SUBL Place 1 tablet under the tongue daily.     denosumab (PROLIA) 60 MG/ML SOSY injection Inject 60 mg into the skin every 6 (six) months.     DULoxetine (CYMBALTA) 30 MG capsule Take 30 mg by mouth daily.     hydrocortisone (ANUSOL-HC) 2.5 % rectal cream Place 1 Application rectally 2 (two)  times daily as needed for hemorrhoids or anal itching. 30 g 1   mirabegron ER (MYRBETRIQ) 25 MG TB24 tablet TAKE 1 TABLET BY MOUTH DAILY 30 tablet 3   Current Facility-Administered Medications on File Prior to Visit  Medication Dose Route Frequency Provider Last Rate Last Admin   denosumab (PROLIA) injection 60 mg  60 mg Subcutaneous Once Eubanks, [name] K, NP       denosumab (PROLIA) injection 60 mg  60 mg Subcutaneous Once Eubanks, [name] K, NP       Allergies  Allergen Reactions   Penicillins Hives   Sulfa Antibiotics Hives and Nausea And Vomiting    7-15   Family History  Problem Relation Age of Onset   Skin cancer Mother    Pneumonia Mother        1/2 lung loss per [facility] New Patient Packet    Clotting disorder Father        PTE X 2   Skin cancer Father    Heart failure  Father        Per [facility] New Patient Packet    Allergies Brother    Asthma Brother    Heart attack Brother        > 54   Bladder Cancer Brother    Diabetes Daughter    Migraines Daughter    Heart attack Daughter    Breast cancer Daughter    Schizophrenia Son        Per [facility] New Patient Packet    Lung disease Son        Per [facility] New Patient Packet    Mental illness Son        suicide by hanging   Diabetes Son    Hepatitis C Son    Sudden death Other        2 M aunts & 2 M uncles in 30s   Stroke Neg Hx    PE: There were no vitals taken for this visit.  Wt Readings from Last 3 Encounters:  08/19/23 166 lb 9.6 oz (75.6 kg)  05/29/23 162 lb (73.5 kg)  05/15/23 167 lb (75.8 kg)   Constitutional: normal weight, in NAD Eyes: EOMI, no exophthalmos ENT: no thyromegaly, no cervical lymphadenopathy Cardiovascular: RRR, No MRG Respiratory: CTA B Musculoskeletal: no deformities Skin: moist, warm, no rashes Neurological: + tremor with outstretched hands  ASSESSMENT: 1. Graves ds.  2. Neck nodule  PLAN:  1. Patient with history of Graves' disease diagnosed in the past based on elevated TSI  antibodies -She was on methimazole but was able to stop in 02/2016 with subsequently normal TFTs. -She had a slightly high TSH with normal free thyroid hormones in 2021 and 2022 by intervention at that time.  TFTs normalized afterwards. Latest TFTs were normal at last visit and we will repeat them today -At today's visit she does not have overt thyrotoxic symptoms.  She has chronic fatigue, tremors, and she continues to have ataxia and falls -At last visit we discussed about how to take levothyroxine correctly in case we need to start: Every day, with water, at least 30 minutes before breakfast, separated by at least 4 hours from: - acid reflux medications - calcium - iron - multivitamins -I will see her back in 1 year, but sooner for labs if needed  2.  Neck nodule -Has a history of a right inferior cervical lymph node felt on palpation -We did not check a thyroid ultrasound but I did advise her to let me know if she feels that it is enlarged.  However, at last visit, this was not palpable anymore -At today's visit, she is not feel pressure in her neck -She had a modified barium swallow study in 10/2021 which showed prominent cricopharyngeus but no external compression on the esophagus -Further investigation is needed for this  Carlus Pavlov, MD PhD [facility]

## 2024-02-21 ENCOUNTER — Ambulatory Visit (INDEPENDENT_AMBULATORY_CARE_PROVIDER_SITE_OTHER): Payer: Medicare Other | Admitting: Nurse Practitioner

## 2024-02-21 ENCOUNTER — Encounter: Payer: Self-pay | Admitting: Nurse Practitioner

## 2024-02-21 ENCOUNTER — Telehealth: Payer: Self-pay

## 2024-02-21 VITALS — BP 128/78 | HR 66 | Temp 96.2°F | Resp 18 | Ht 65.0 in | Wt 168.6 lb

## 2024-02-21 DIAGNOSIS — R413 Other amnesia: Secondary | ICD-10-CM | POA: Diagnosis not present

## 2024-02-21 DIAGNOSIS — F339 Major depressive disorder, recurrent, unspecified: Secondary | ICD-10-CM | POA: Diagnosis not present

## 2024-02-21 DIAGNOSIS — N3281 Overactive bladder: Secondary | ICD-10-CM | POA: Diagnosis not present

## 2024-02-21 DIAGNOSIS — M810 Age-related osteoporosis without current pathological fracture: Secondary | ICD-10-CM | POA: Diagnosis not present

## 2024-02-21 DIAGNOSIS — N1831 Chronic kidney disease, stage 3a: Secondary | ICD-10-CM

## 2024-02-21 DIAGNOSIS — E7849 Other hyperlipidemia: Secondary | ICD-10-CM | POA: Diagnosis not present

## 2024-02-21 NOTE — Telephone Encounter (Signed)
Significant memory decline noted on memory screening. Blood work was complete but also discussed with her about getting CT of the head completed. Please help her make and keep this appt. Also advised against driving. Would recommend that he come with her to her next appt. If needed to make appt after results of the CT of head come in and we can discuss together.

## 2024-02-21 NOTE — Patient Instructions (Signed)
Recommended to take calcium 600 mg twice daily with Vitamin D 2000 units daily and weight bearing activity 30 mins/5 days a week  Start taking the calcium and vit D due for osteoporosis.

## 2024-02-21 NOTE — Telephone Encounter (Signed)
Patient spouse called asking to speak with Faith Edwards about the results of memory test performed today. When I asked if there was any way I could assist Faith Edwards stated " The only thing you can do for me is get my message to Faith Edwards and have her call me on Monday."

## 2024-02-21 NOTE — Progress Notes (Signed)
Careteam: Patient Care Team: Sharon Seller, NP as PCP - General (Geriatric Medicine) Nyoka Cowden, MD as Consulting Physician (Pulmonary Disease) Blima Ledger, OD (Optometry) Archer Asa, MD as Consulting Physician (Psychiatry) Carlus Pavlov, MD as Consulting Physician (Internal Medicine) Cleotis Nipper, MD as Referring Physician (Orthopedic Surgery) Irena Cords, Enzo Montgomery, MD as Consulting Physician (Allergy and Immunology) Meryl Dare, MD (Inactive) as Consulting Physician (Gastroenterology) Delfin Gant, MD as Attending Physician (Family Medicine) Christia Reading, MD as Consulting Physician (Otolaryngology)  PLACE OF SERVICE:  [facility]  Advanced Directive information    Allergies  Allergen Reactions   Penicillins Hives   Sulfa Antibiotics Hives and Nausea And Vomiting    7-15    Chief Complaint  Patient presents with   Medical Management of Chronic Issues    6 mths/Prolia Injection, No Copay, No PA Labs   Immunizations    Influenza and covid   Health Maintenance    Medicare Wellness Visit    Discussed the use of AI scribe software for clinical note transcription with the patient, who gave verbal consent to proceed.  History of Present Illness   Faith Edwards is a 89 year old female with osteoporosis who presents for a six-month follow-up and Prolia injection.  She has not been taking her calcium or vitamin D supplements as part of her osteoporosis management.   She experiences memory problems, attributing them to her age. Her husband assists her with these issues. She has stopped taking Cymbalta, feeling better without it, and manages her medications independently. She is not taking any over-the-counter supplements, including B12.   She is not currently taking Myrbetriq for her overactive bladder, as it was ineffective.  She has a daughter who lives in [place] and visits her often. She is cautious with driving, only going to familiar  places, and her husband often accompanies her for shopping and other activities.      Review of Systems:  Review of Systems  Constitutional:  Negative for chills, fever and weight loss.  HENT:  Negative for tinnitus.   Respiratory:  Negative for cough, sputum production and shortness of breath.   Cardiovascular:  Negative for chest pain, palpitations and leg swelling.  Gastrointestinal:  Negative for abdominal pain, constipation, diarrhea and heartburn.  Genitourinary:  Negative for dysuria, frequency and urgency.  Musculoskeletal:  Negative for back pain, falls, joint pain and myalgias.  Skin: Negative.   Neurological:  Negative for dizziness and headaches.  Psychiatric/Behavioral:  Positive for memory loss. Negative for depression. The patient does not have insomnia.     Past Medical History:  Diagnosis Date   Anxiety    Arthritis    Asthma    Bronchitis    hx of    Chronic headaches    [facility]   COVID-19    Cystitis    1954   Depression    Ear pain    bilat    Hiatal hernia    History of blood clots    Lungs, per [facility] new patient packet    History of blood transfusion    2010   Hx of blood clots 2007 & 2010   post TKR   Hyperthyroidism    Pelvic fracture (HCC)    hx of in three places secondary to fall    Pneumonia    hx of 1952   Pulmonary embolism (HCC) 1993   on HRT   Right wrist fracture    hx of  1995 has plate secondary to fall    Shaking    hands bilat   Shingles     per [facility] new patient packet    Shortness of breath dyspnea    Skin cancer    basal cell    Spinal stenosis    Trauma    left lower leg 10/22/2014    Urinary frequency    Urticaria    Past Surgical History:  Procedure Laterality Date   ABDOMINAL HYSTERECTOMY  1969    per [facility] new patient packet    ADENOIDECTOMY     BREAST ENHANCEMENT SURGERY  1985   CHOLECYSTECTOMY  1992    per [facility] new patient packet    COLONOSCOPY     diverticulosis   ESI   11/2013    C7-T1 ; Dr Ethelene Hal    EYE SURGERY     bilat cataract surgery    INSERTION OF MESH N/A 07/08/2015   Procedure: INSERTION OF MESH;  Surgeon: Darnell Level, MD;  Location: [facility]S;  Service: General;  Laterality: N/A;   REPLACEMENT TOTAL KNEE BILATERAL  2007 & 2010   SP FACET INJECTION  01/19/14   RC2-3,3-4,C4-5   TONSILLECTOMY  1945    per [facility] new patient packet    VENTRAL HERNIA REPAIR N/A 07/08/2015   Procedure: VENTRAL ADULT REPAIR VENTRAL INCISIONAL HERNIA REPAIR;  Surgeon: Darnell Level, MD;  Location: [facility]S;  Service: General;  Laterality: N/A;   WRIST FRACTURE SURGERY Bilateral 10  years and 2 years/   WRIST SURGERY Left 06/2016   plate    Social History:   reports that she has never smoked. She has never used smokeless tobacco. She reports that she does not currently use alcohol after a past usage of about 1.0 standard drink of alcohol per week. She reports that she does not use drugs.  Family History  Problem Relation Age of Onset   Skin cancer Mother    Pneumonia Mother        1/2 lung loss per [facility] New Patient Packet    Clotting disorder Father        PTE X 2   Skin cancer Father    Heart failure Father        Per [facility] New Patient Packet    Allergies Brother    Asthma Brother    Heart attack Brother        > 55   Bladder Cancer Brother    Diabetes Daughter    Migraines Daughter    Heart attack Daughter    Breast cancer Daughter    Schizophrenia Son        Per [facility] New Patient Packet    Lung disease Son        Per [facility] New Patient Packet    Mental illness Son        suicide by hanging   Diabetes Son    Hepatitis C Son    Sudden death Other        2 M aunts & 2 M uncles in 7s   Stroke Neg Hx     Medications: Patient's Medications  New Prescriptions   No medications on file  Previous Medications   ATORVASTATIN (LIPITOR) 20 MG TABLET    TAKE 1 TABLET(20 MG) BY MOUTH DAILY   CALCIUM CARBONATE (CALCIUM 600) 600 MG TABS TABLET    Take 1 tablet (600 mg total) by mouth 2 (two) times daily  with a meal.   CHOLECALCIFEROL (VITAMIN  D) 1000 UNITS TABLET    Take 1,000 Units by mouth daily.   CYANOCOBALAMIN (B-12) 3000 MCG SUBL    Place 1 tablet under the tongue daily.   DENOSUMAB (PROLIA) 60 MG/ML SOSY INJECTION    Inject 60 mg into the skin every 6 (six) months.   HYDROCORTISONE (ANUSOL-HC) 2.5 % RECTAL CREAM    Place 1 Application rectally 2 (two) times daily as needed for hemorrhoids or anal itching.  Modified Medications   No medications on file  Discontinued Medications   ASCORBIC ACID (VITAMIN C) 500 MG TABLET    Take 500 mg by mouth daily.   DULOXETINE (CYMBALTA) 30 MG CAPSULE    Take 30 mg by mouth daily.   MIRABEGRON ER (MYRBETRIQ) 25 MG TB24 TABLET    TAKE 1 TABLET BY MOUTH DAILY    Physical Exam:  Vitals:   02/21/24 1450  BP: 128/78  Pulse: 66  Resp: 18  Temp: (!) 96.2 F (35.7 C)  SpO2: 96%  Weight: 168 lb 9.6 oz (76.5 kg)  Height: 5\' 5"  (1.651 m)   Body mass index is 28.06 kg/m. Wt Readings from Last 3 Encounters:  02/21/24 168 lb 9.6 oz (76.5 kg)  08/19/23 166 lb 9.6 oz (75.6 kg)  05/29/23 162 lb (73.5 kg)    Physical Exam Constitutional:      General: She is not in acute distress.    Appearance: She is well-developed. She is not diaphoretic.  HENT:     Head: Normocephalic and atraumatic.     Mouth/Throat:     Pharynx: No oropharyngeal exudate.  Eyes:     Conjunctiva/sclera: Conjunctivae normal.     Pupils: Pupils are equal, round, and reactive to light.  Cardiovascular:     Rate and Rhythm: Normal rate and regular rhythm.     Heart sounds: Normal heart sounds.  Pulmonary:     Effort: Pulmonary effort is normal.     Breath sounds: Normal breath sounds.  Abdominal:     General: Bowel sounds are normal.     Palpations: Abdomen is soft.  Musculoskeletal:     Cervical back: Normal range of motion and neck supple.     Right lower leg: No edema.     Left lower leg: No edema.  Skin:    General: Skin is warm and dry.  Neurological:      Mental Status: She is alert.  Psychiatric:        Mood and Affect: Mood normal.        Cognition and Memory: Memory is impaired.     Labs reviewed: Basic Metabolic Panel: Recent Labs    08/19/23 1523 02/07/24 0949  NA 137 139  K 4.8 4.6  CL 106 104  CO2 22 26  GLUCOSE 104 111*  BUN 23 21  CREATININE 1.11* 1.08*  CALCIUM 8.5* 9.9   Liver Function Tests: Recent Labs    08/19/23 1523 02/07/24 0949  AST 26 19  ALT 21 19  BILITOT 0.6 0.8  PROT 6.8 6.9   No results for input(s): "LIPASE", "AMYLASE" in the last 8760 hours. No results for input(s): "AMMONIA" in the last 8760 hours. CBC: Recent Labs    08/19/23 1523  WBC 7.8  NEUTROABS 4,680  HGB 13.6  HCT 41.4  MCV 95.6  PLT 278   Lipid Panel: Recent Labs    08/19/23 1523  CHOL 216*  HDL 68  LDLCALC 128*  TRIG 95  CHOLHDL 3.2   TSH: No results  for input(s): "TSH" in the last 8760 hours. A1C: Lab Results  Component Value Date   HGBA1C 5.6 01/05/2021     Assessment/Plan Assessment and Plan Memory Loss Patient reports memory issues. No current support for medication management. Husband assists with memory support. No current anxiety or depression reported. -reports she still drives, advised against this due to memory issues and went into details about risk of driving with her memory/age.  -Order CT of the head to rule out stroke or other abnormalities. -Check B12 level today due to history of B12 deficiency. -     Vitamin B12 -     CBC with Differential/Platelet -     TSH -     RPR -     CT HEAD WO CONTRAST ( ); Future Recommend husband to come with her to follow up.   Osteoporosis Patient due for Prolia injection today. Not currently taking recommended supplements (calcium and vitamin D). -Administer Prolia injection today. -Recommend calcium 600mg  twice daily and vitamin D 1000 units daily.  Hyperlipidemia Patient not currently taking prescribed Lipitor (atorvastatin). - follow-up on  cholesterol levels.  Overactive Bladder Patient reports nocturia. Previously prescribed Myrbetriq discontinued due to lack of efficacy. -Discontinue Myrbetriq from medication list.   Return in about 6 months (around 08/20/2024) for routine follow up, labs prior to visit.:   Caylen Yardley K. [facility] [phone]

## 2024-02-22 LAB — CBC WITH DIFFERENTIAL/PLATELET
Absolute Lymphocytes: 2277 {cells}/uL (ref 850–3900)
Absolute Monocytes: 738 {cells}/uL (ref 200–950)
Basophils Absolute: 41 {cells}/uL (ref 0–200)
Basophils Relative: 0.6 %
Eosinophils Absolute: 179 {cells}/uL (ref 15–500)
Eosinophils Relative: 2.6 %
HCT: 39.9 % (ref 35.0–45.0)
Hemoglobin: 13.2 g/dL (ref 11.7–15.5)
MCH: 32 pg (ref 27.0–33.0)
MCHC: 33.1 g/dL (ref 32.0–36.0)
MCV: 96.6 fL (ref 80.0–100.0)
MPV: 10.7 fL (ref 7.5–12.5)
Monocytes Relative: 10.7 %
Neutro Abs: 3664 {cells}/uL (ref 1500–7800)
Neutrophils Relative %: 53.1 %
Platelets: 290 10*3/uL (ref 140–400)
RBC: 4.13 10*6/uL (ref 3.80–5.10)
RDW: 12.1 % (ref 11.0–15.0)
Total Lymphocyte: 33 %
WBC: 6.9 10*3/uL (ref 3.8–10.8)

## 2024-02-22 LAB — VITAMIN B12: Vitamin B-12: 549 pg/mL (ref 200–1100)

## 2024-02-22 LAB — RPR: RPR Ser Ql: NONREACTIVE

## 2024-02-22 LAB — TSH: TSH: 3.72 m[IU]/L (ref 0.40–4.50)

## 2024-02-24 ENCOUNTER — Encounter: Payer: Self-pay | Admitting: Nurse Practitioner

## 2024-02-24 ENCOUNTER — Telehealth: Payer: Self-pay | Admitting: Emergency Medicine

## 2024-02-24 NOTE — Telephone Encounter (Signed)
 Patient's husband rode up to the office on a bike, lives in [place].   Him and his Daughter is going to start coming with patient to her appointments and going to help her with her medical care. Husband stated that wife has agreed that she is having some memory loss and is willing to have some help.   Husband is needing a Proxy letter to get access to patient's mychart. Wife cannot remember password and he was told to call the MyChart Help line and they told him that he would need a letter from patient's PCP to gain access.   Please Advise.

## 2024-02-24 NOTE — Telephone Encounter (Signed)
 They called the Number given for MyChart Assistance and they told her in order for them to reset the passwork she would have to have a Proxy letter from her PCP.   The number was given to him to call to help patient set up an appointment for CT Scan and a copy of the labwork was given with Highlighted response.  Husband notified and agreed and will get the Vitamin B12.

## 2024-02-24 NOTE — Telephone Encounter (Signed)
 Would recommend patients husband coming to next office visit with wife, also lab work was completed and it has been recommended to get CT of head due to memory issues, he could assist with scheduling this at [facility].

## 2024-02-24 NOTE — Telephone Encounter (Signed)
 Spoke with patients husband. Explained that he would need an appointment. Set up an appointment for 3/17

## 2024-02-24 NOTE — Telephone Encounter (Signed)
 Patients husband would like to a call from Shanda Bumps to speak with her about patients visit 02/21/2024.

## 2024-02-24 NOTE — Telephone Encounter (Signed)
 Left detailed message on voicemail with [name]'s response

## 2024-02-24 NOTE — Telephone Encounter (Signed)
 There should be a number on her after visit summery that you can call to reset her password.  I am glad her daughter and husband will be coming to appt for assistance. I also recommend them helping her set up appt for the CT of head and recommend someone riding with her to make sure she is safe to drive.

## 2024-02-25 ENCOUNTER — Telehealth: Payer: Self-pay | Admitting: Emergency Medicine

## 2024-02-25 NOTE — Telephone Encounter (Signed)
 Patients husband left a message about wanting the number to call where wife will be getting CT. Medical assistant called back at 8:30am and gave the number.

## 2024-02-27 NOTE — Telephone Encounter (Signed)
 Letter completed and printed.

## 2024-03-16 ENCOUNTER — Encounter: Payer: Self-pay | Admitting: Nurse Practitioner

## 2024-03-16 ENCOUNTER — Ambulatory Visit (INDEPENDENT_AMBULATORY_CARE_PROVIDER_SITE_OTHER): Payer: Medicare Other | Admitting: Nurse Practitioner

## 2024-03-16 VITALS — BP 118/70 | HR 90 | Ht 65.0 in | Wt 165.2 lb

## 2024-03-16 DIAGNOSIS — R413 Other amnesia: Secondary | ICD-10-CM

## 2024-03-16 DIAGNOSIS — M810 Age-related osteoporosis without current pathological fracture: Secondary | ICD-10-CM | POA: Diagnosis not present

## 2024-03-16 DIAGNOSIS — E7849 Other hyperlipidemia: Secondary | ICD-10-CM

## 2024-03-16 NOTE — Patient Instructions (Signed)
 To call [place] [facility] to schedule CT of head [phone]

## 2024-03-16 NOTE — Progress Notes (Signed)
 Careteam: Patient Care Team: Sharon Seller, NP as PCP - General (Geriatric Medicine) Nyoka Cowden, MD as Consulting Physician (Pulmonary Disease) Blima Ledger, OD (Optometry) Archer Asa, MD as Consulting Physician (Psychiatry) Carlus Pavlov, MD as Consulting Physician (Internal Medicine) Cleotis Nipper, MD as Referring Physician (Orthopedic Surgery) Irena Cords, Enzo Montgomery, MD as Consulting Physician (Allergy and Immunology) Meryl Dare, MD (Inactive) as Consulting Physician (Gastroenterology) Delfin Gant, MD as Attending Physician (Family Medicine) Christia Reading, MD as Consulting Physician (Otolaryngology)  PLACE OF SERVICE:  [facility]  Advanced Directive information    Allergies  Allergen Reactions   Penicillins Hives   Sulfa Antibiotics Hives and Nausea And Vomiting    7-15    Chief Complaint  Patient presents with   Memory Loss    Follow-up per husband request.     Discussed the use of AI scribe software for clinical note transcription with the patient, who gave verbal consent to proceed.  History of Present Illness   Faith Edwards is a 89 year old female who presents with worsening memory. She is accompanied by her husband, who is her primary caregiver.  She has been experiencing a decline in memory over time, confirmed by memory screenings showing progressive deficits. Her husband, who is actively involved in her care, has also noted these memory issues. During her last visit, she was in a poor emotional state due to the recent death of her brother, which may have impacted her memory at that time. She has a history of depression following the loss of her son, for which she was previously on medication but has since discontinued as she and her husband feel she is no longer necessary.  A CT scan of the head has been ordered due to her memory loss, such as a stroke. She has not yet scheduled this appointment. Her B12 levels were found to be on  the low side of normal, and she has started taking B12 supplements. She does not recall being informed about the need for B12 supplementation during her last visit, possibly due to her emotional state at the time.  Her husband expresses concern about her driving, noting that she only drives to familiar places such as [employer]. He usually drives her, but she occasionally drives herself. They have discussed the potential risks associated with her memory decline and driving, such as getting lost if traffic patterns change.      Review of Systems:  Review of Systems  Constitutional:  Negative for chills, fever and weight loss.  HENT:  Negative for tinnitus.   Respiratory:  Negative for cough, sputum production and shortness of breath.   Cardiovascular:  Negative for chest pain, palpitations and leg swelling.  Gastrointestinal:  Negative for abdominal pain, constipation, diarrhea and heartburn.  Genitourinary:  Negative for dysuria, frequency and urgency.  Musculoskeletal:  Negative for back pain, falls, joint pain and myalgias.  Skin: Negative.   Neurological:  Negative for dizziness and headaches.  Psychiatric/Behavioral:  Positive for memory loss. Negative for depression. The patient does not have insomnia.     Past Medical History:  Diagnosis Date   Anxiety    Arthritis    Asthma    Bronchitis    hx of    Chronic headaches    [facility]   COVID-19    Cystitis    1954   Depression    Ear pain    bilat    Hiatal hernia  History of blood clots    Lungs, per [facility] new patient packet    History of blood transfusion    2010   Hx of blood clots 2007 & 2010   post TKR   Hyperthyroidism    Pelvic fracture (HCC)    hx of in three places secondary to fall    Pneumonia    hx of 1952   Pulmonary embolism (HCC) 1993   on HRT   Right wrist fracture    hx of 1995 has plate secondary to fall    Shaking    hands bilat   Shingles     per [facility] new patient packet     Shortness of breath dyspnea    Skin cancer    basal cell    Spinal stenosis    Trauma    left lower leg 10/22/2014    Urinary frequency    Urticaria    Past Surgical History:  Procedure Laterality Date   ABDOMINAL HYSTERECTOMY  1969    per [facility] new patient packet    ADENOIDECTOMY     BREAST ENHANCEMENT SURGERY  1985   CHOLECYSTECTOMY  1992    per [facility] new patient packet    COLONOSCOPY     diverticulosis   ESI   11/2013    C7-T1 ; Dr Ethelene Hal   EYE SURGERY     bilat cataract surgery    INSERTION OF MESH N/A 07/08/2015   Procedure: INSERTION OF MESH;  Surgeon: Darnell Level, MD;  Location: [facility];  Service: General;  Laterality: N/A;   REPLACEMENT TOTAL KNEE BILATERAL  2007 & 2010   SP FACET INJECTION  01/19/14   RC2-3,3-4,C4-5   TONSILLECTOMY  1945    per [facility] new patient packet    VENTRAL HERNIA REPAIR N/A 07/08/2015   Procedure: VENTRAL ADULT REPAIR VENTRAL INCISIONAL HERNIA REPAIR;  Surgeon: Darnell Level, MD;  Location: [facility];  Service: General;  Laterality: N/A;   WRIST FRACTURE SURGERY Bilateral 10  years and 2 years/   WRIST SURGERY Left 06/2016   plate    Social History:   reports that she has never smoked. She has never used smokeless tobacco. She reports that she does not currently use alcohol after a past usage of about 1.0 standard drink of alcohol per week. She reports that she does not use drugs.  Family History  Problem Relation Age of Onset   Skin cancer Mother    Pneumonia Mother        1/2 lung loss per [facility] New Patient Packet    Clotting disorder Father        PTE X 2   Skin cancer Father    Heart failure Father        Per [facility] New Patient Packet    Allergies Brother    Asthma Brother    Heart attack Brother        > 55   Bladder Cancer Brother    Diabetes Daughter    Migraines Daughter    Heart attack Daughter    Breast cancer Daughter    Schizophrenia Son        Per [facility] New Patient Packet    Lung disease Son        Per [facility] New Patient Packet    Mental  illness Son        suicide by hanging   Diabetes Son    Hepatitis C Son    Sudden death Other  2 M aunts & 2 M uncles in 72s   Stroke Neg Hx     Medications: Patient's Medications  New Prescriptions   No medications on file  Previous Medications   ATORVASTATIN (LIPITOR) 20 MG TABLET    TAKE 1 TABLET(20 MG) BY MOUTH DAILY   CALCIUM CARBONATE (CALCIUM 600) 600 MG TABS TABLET    Take 1 tablet (600 mg total) by mouth 2 (two) times daily with a meal.   CHOLECALCIFEROL (VITAMIN D) 1000 UNITS TABLET    Take 1,000 Units by mouth daily.   CYANOCOBALAMIN (B-12) 3000 MCG SUBL    Place 1 tablet under the tongue daily.   DENOSUMAB (PROLIA) 60 MG/ML SOSY INJECTION    Inject 60 mg into the skin every 6 (six) months.   HYDROCORTISONE (ANUSOL-HC) 2.5 % RECTAL CREAM    Place 1 Application rectally 2 (two) times daily as needed for hemorrhoids or anal itching.  Modified Medications   No medications on file  Discontinued Medications   No medications on file    Physical Exam:  Vitals:   03/16/24 0808  BP: 118/70  Pulse: 90  SpO2: 96%  Weight: 165 lb 3.2 oz (74.9 kg)  Height: 5\' 5"  (1.651 m)   Body mass index is 27.49 kg/m. Wt Readings from Last 3 Encounters:  03/16/24 165 lb 3.2 oz (74.9 kg)  02/21/24 168 lb 9.6 oz (76.5 kg)  08/19/23 166 lb 9.6 oz (75.6 kg)    Physical Exam Constitutional:      General: She is not in acute distress.    Appearance: She is well-developed. She is not diaphoretic.  HENT:     Head: Normocephalic and atraumatic.     Mouth/Throat:     Pharynx: No oropharyngeal exudate.  Eyes:     Conjunctiva/sclera: Conjunctivae normal.     Pupils: Pupils are equal, round, and reactive to light.  Cardiovascular:     Rate and Rhythm: Normal rate and regular rhythm.     Heart sounds: Normal heart sounds.  Pulmonary:     Effort: Pulmonary effort is normal.     Breath sounds: Normal breath sounds.  Abdominal:     General: Bowel sounds are normal.     Palpations:  Abdomen is soft.  Musculoskeletal:     Cervical back: Normal range of motion and neck supple.     Right lower leg: No edema.     Left lower leg: No edema.  Skin:    General: Skin is warm and dry.  Neurological:     Mental Status: She is alert.  Psychiatric:        Mood and Affect: Mood normal.     Labs reviewed: Basic Metabolic Panel: Recent Labs    08/19/23 1523 02/07/24 0949 02/21/24 1517  NA 137 139  --   K 4.8 4.6  --   CL 106 104  --   CO2 22 26  --   GLUCOSE 104 111*  --   BUN 23 21  --   CREATININE 1.11* 1.08*  --   CALCIUM 8.5* 9.9  --   TSH  --   --  3.72   Liver Function Tests: Recent Labs    08/19/23 1523 02/07/24 0949  AST 26 19  ALT 21 19  BILITOT 0.6 0.8  PROT 6.8 6.9   No results for input(s): "LIPASE", "AMYLASE" in the last 8760 hours. No results for input(s): "AMMONIA" in the last 8760 hours. CBC: Recent Labs    08/19/23  1523 02/21/24 1517  WBC 7.8 6.9  NEUTROABS 4,680 3,664  HGB 13.6 13.2  HCT 41.4 39.9  MCV 95.6 96.6  PLT 278 290   Lipid Panel: Recent Labs    08/19/23 1523  CHOL 216*  HDL 68  LDLCALC 128*  TRIG 95  CHOLHDL 3.2   TSH: Recent Labs    02/21/24 1517  TSH 3.72   A1C: Lab Results  Component Value Date   HGBA1C 5.6 01/05/2021     Assessment/Plan Assessment and Plan    Memory Loss Progressive memory loss with worsening deficits.  Driving safety is a concern. - Order CT scan of the head to evaluate for stroke or structural abnormalities. - Encourage intake of B12 vitamins. - referral to a neurologist.  Osteoporosis Currently treated with calcium, vitamin D, and Prolia. Recent calcium levels are normal. - Continue calcium and vitamin D supplementation. - Administer Prolia injection every six months. - Schedule lab work before next appointment to check calcium levels. - Use a pill box for medication organization.  Hyperlipidemia Slightly elevated cholesterol levels managed with Lipitor. - Ensure  adherence to Lipitor.    To keep follow up as scheduled.   Janene Harvey. [facility]ag[facility] [phone]

## 2024-03-24 ENCOUNTER — Other Ambulatory Visit: Payer: Self-pay | Admitting: Nurse Practitioner

## 2024-03-24 MED ORDER — ATORVASTATIN CALCIUM 20 MG PO TABS: 20.0000 mg | ORAL_TABLET | Freq: Every day | ORAL | 3 refills | Status: DC

## 2024-03-24 MED ORDER — CALCIUM CARBONATE 600 MG PO TABS: 600.0000 mg | ORAL_TABLET | Freq: Two times a day (BID) | ORAL | 1 refills | Status: AC

## 2024-03-24 NOTE — Telephone Encounter (Signed)
 Copied from CRM [id number]. Topic: Clinical - Medication Refill >> Mar 24, 2024 10:35 AM Corin V wrote: Most Recent Primary Care Visit:  Provider: Sharon Seller  Department: [facility]  Visit Type: OFFICE VISIT  Date: 03/16/2024  Medication: atorvastatin (LIPITOR) 20 MG tablet calcium carbonate (CALCIUM 600) 600 MG TABS tablet  Has the patient contacted their pharmacy? Yes (Agent: If no, request that the patient contact the pharmacy for the refill. If patient does not wish to contact the pharmacy document the reason why and proceed with request.) (Agent: If yes, when and what did the pharmacy advise?)  Is this the correct pharmacy for this prescription? Yes If no, delete pharmacy and type the correct one.  This is the patient's preferred pharmacy:  [id number] - [place], [place] - [address] AT [facility] [address] [place] [address] Phone: [phone] Fax: [phone]   Has the prescription been filled recently? No  Is the patient out of the medication? Yes  Has the patient been seen for an appointment in the last year OR does the patient have an upcoming appointment? Yes  Can we respond through MyChart? No  Agent: Please be advised that Rx refills may take up to 3 business days. We ask that you follow-up with your pharmacy.

## 2024-04-09 ENCOUNTER — Ambulatory Visit
Admission: RE | Admit: 2024-04-09 | Discharge: 2024-04-09 | Disposition: A | Discharge status: Home / Self Care | Source: Ambulatory Visit | Attending: Nurse Practitioner | Admitting: Nurse Practitioner

## 2024-04-09 DIAGNOSIS — R413 Other amnesia: Secondary | ICD-10-CM

## 2024-04-27 ENCOUNTER — Encounter: Payer: Self-pay | Admitting: Nurse Practitioner

## 2024-07-21 ENCOUNTER — Ambulatory Visit: Admitting: Family

## 2024-07-21 ENCOUNTER — Telehealth: Payer: Self-pay | Admitting: *Deleted

## 2024-07-21 DIAGNOSIS — M810 Age-related osteoporosis without current pathological fracture: Secondary | ICD-10-CM

## 2024-07-21 MED ORDER — DENOSUMAB 60 MG/ML ~~LOC~~ SOSY
60.0000 mg | PREFILLED_SYRINGE | SUBCUTANEOUS | Status: AC
  Administered 2024-08-21: 60 mg via SUBCUTANEOUS

## 2024-07-21 NOTE — Telephone Encounter (Signed)
 Received [employer] Verification for Prolia  No Copay, No PA.  Labs scheduled for 8/11

## 2024-07-27 ENCOUNTER — Telehealth: Payer: Self-pay | Admitting: *Deleted

## 2024-07-27 NOTE — Telephone Encounter (Addendum)
 Spoke with husband, Lashelle Koy. Has an upcoming appointment to discuss per Harlene An, NP

## 2024-07-27 NOTE — Telephone Encounter (Addendum)
 Copied from CRM #[id number]. Topic: Referral - Request for Referral >> Jul 27, 2024 11:28 AM Laurier C wrote: Did the patient discuss referral with their provider in the last year? Yes  Appointment offered? No  Type of order/referral and detailed reason for visit: Neurology/ Memory Loss  Please give patients Daughter Stevie) a call because patient is not in the right state of mind to make any decisions regarding this appointment. Naomie can be reached at [phone], she is looking for the 1st available appointment  Preference of office, provider, location: [facility]  If referral order, have you been seen by this specialty before? No  Can we respond through MyChart? Yes

## 2024-07-27 NOTE — Telephone Encounter (Signed)
 Faith Edwards received a letter from Faith Edwards, Husband, stating Question: On the clinical Data Memory Loss data, are there any actions that can be imitated to enhance or stabilize the lab results for memory loss?  (Second request)  Faith Edwards [phone]   Faith Edwards wanted me to call and have him come with patient to her appointment on 8/22 to discuss concerns face to face.   Called husband and informed him of [name]'s response. Husband confirmed appointment and Lab appointment and stated that he would be there.

## 2024-07-31 ENCOUNTER — Telehealth: Payer: Self-pay

## 2024-07-31 NOTE — Telephone Encounter (Signed)
 Patient daughter states that she's taking YaraTera Magnesium. Its a powder supplement that's mixed into liquid. Patient daughter states that she's only taking this product because a Nurse is selling it her husband. Her daughter also states that mother has been having spells of crying and saying Im afraid I'm going to hurt myself. I have called patient husband to clarify how much she's taking, and why its being given to her as well. I had to leave a voicemail because he didn't pick up the phone. Message routed to PCP Caro, Harlene POUR, NP

## 2024-07-31 NOTE — Telephone Encounter (Signed)
 Please clarify dosing and why she is taking, should be fine.

## 2024-07-31 NOTE — Telephone Encounter (Signed)
 Copied from CRM [id number]. Topic: Clinical - Medical Advice >> Jul 30, 2024  5:39 PM Mercer PEDLAR wrote: Reason for CRM: Patient's daughter, Alwyn Boon called stating that her mother has stage 3 chronic kidney disease and dementia and has started taking a pure magnesium supplement. She would like a callback to let her know if it is okay to continue taking the supplement.    Callback: [phone]

## 2024-07-31 NOTE — Telephone Encounter (Signed)
 Patient's husband is returning call, I informed him office closes at 3 PM on Fridays and he will get a callback on Monday.

## 2024-07-31 NOTE — Telephone Encounter (Signed)
 Please see message below and reply. I will call patient daughter and verbalize your response. Message routed to PCP Caro, Harlene POUR, NP

## 2024-08-03 NOTE — Telephone Encounter (Signed)
 Tried calling husband, [name], [place] to return call

## 2024-08-03 NOTE — Telephone Encounter (Signed)
 If she is concerning it is hurting her stop taking

## 2024-08-03 NOTE — Telephone Encounter (Signed)
 Message routed to clinical intake for today [place].M/CMA.

## 2024-08-03 NOTE — Telephone Encounter (Signed)
Patient husband notified and agreed.

## 2024-08-07 ENCOUNTER — Other Ambulatory Visit: Payer: Self-pay | Admitting: Nurse Practitioner

## 2024-08-07 DIAGNOSIS — M810 Age-related osteoporosis without current pathological fracture: Secondary | ICD-10-CM

## 2024-08-07 DIAGNOSIS — E7849 Other hyperlipidemia: Secondary | ICD-10-CM

## 2024-08-07 DIAGNOSIS — N1831 Chronic kidney disease, stage 3a: Secondary | ICD-10-CM

## 2024-08-10 ENCOUNTER — Other Ambulatory Visit: Payer: Medicare Other

## 2024-08-10 ENCOUNTER — Telehealth: Payer: Self-pay

## 2024-08-10 DIAGNOSIS — E7849 Other hyperlipidemia: Secondary | ICD-10-CM | POA: Diagnosis not present

## 2024-08-10 DIAGNOSIS — N1831 Chronic kidney disease, stage 3a: Secondary | ICD-10-CM | POA: Diagnosis not present

## 2024-08-10 DIAGNOSIS — M810 Age-related osteoporosis without current pathological fracture: Secondary | ICD-10-CM | POA: Diagnosis not present

## 2024-08-10 LAB — LIPID PANEL
Cholesterol: 152 mg/dL (ref <200)
HDL: 75 mg/dL (ref >=50)
LDL Cholesterol (Calc): 61 mg/dL
Non-HDL Cholesterol (Calc): 77 mg/dL (ref <130)
Total CHOL/HDL Ratio: 2 (calc) (ref <5.0)
Triglycerides: 78 mg/dL (ref <150)

## 2024-08-10 LAB — CBC WITH DIFFERENTIAL/PLATELET
Absolute Lymphocytes: 1550 {cells}/uL (ref 850–3900)
Absolute Monocytes: 722 {cells}/uL (ref 200–950)
Basophils Absolute: 33 {cells}/uL (ref 0–200)
Basophils Relative: 0.4 %
Eosinophils Absolute: 98 {cells}/uL (ref 15–500)
Eosinophils Relative: 1.2 %
HCT: 39.2 % (ref 35.0–45.0)
Hemoglobin: 12.8 g/dL (ref 11.7–15.5)
MCH: 32.5 pg (ref 27.0–33.0)
MCHC: 32.7 g/dL (ref 32.0–36.0)
MCV: 99.5 fL (ref 80.0–100.0)
MPV: 10.3 fL (ref 7.5–12.5)
Monocytes Relative: 8.8 %
Neutro Abs: 5797 {cells}/uL (ref 1500–7800)
Neutrophils Relative %: 70.7 %
Platelets: 269 Thousand/uL (ref 140–400)
RBC: 3.94 Million/uL (ref 3.80–5.10)
RDW: 11.9 % (ref 11.0–15.0)
Total Lymphocyte: 18.9 %
WBC: 8.2 Thousand/uL (ref 3.8–10.8)

## 2024-08-10 LAB — COMPREHENSIVE METABOLIC PANEL WITH GFR
AG Ratio: 2 (calc) (ref 1.0–2.5)
ALT: 23 U/L (ref 6–29)
AST: 25 U/L (ref 10–35)
Albumin: 4.3 g/dL (ref 3.6–5.1)
Alkaline phosphatase (APISO): 58 U/L (ref 37–153)
BUN/Creatinine Ratio: 24 (calc) — ABNORMAL HIGH (ref 6–22)
BUN: 25 mg/dL (ref 7–25)
CO2: 23 mmol/L (ref 20–32)
Calcium: 9.4 mg/dL (ref 8.6–10.4)
Chloride: 104 mmol/L (ref 98–110)
Creat: 1.05 mg/dL — ABNORMAL HIGH (ref 0.60–0.95)
Globulin: 2.2 g/dL (ref 1.9–3.7)
Glucose, Bld: 101 mg/dL — ABNORMAL HIGH (ref 65–99)
Potassium: 4.8 mmol/L (ref 3.5–5.3)
Sodium: 137 mmol/L (ref 135–146)
Total Bilirubin: 1.1 mg/dL (ref 0.2–1.2)
Total Protein: 6.5 g/dL (ref 6.1–8.1)
eGFR: 50 mL/min/1.73m2 — ABNORMAL LOW (ref >=60)

## 2024-08-10 NOTE — Telephone Encounter (Signed)
 [facility] lab tech Montie, asked me for an order for a urine specimen that patient left today. I asked Caro Harlene POUR, NP if she knew why a urine specimen was left and she was not aware of any communication regarding the random urine specimen that was left and suggested I call patient and offer an appointment to discuss.  Outgoing call placed to patient, spoke with [name] (spouse) and he stated that his daughter is a Engineer, civil (consulting) and suggested that they leave a urine specimen  A lot of memory aging processes can be determined through a urine collection. I informed [name] that Mrs.Gertz would need to have an appointment to discuss any issues related to collecting a urine specimen to assure the correct order and diagnosis is associated with specimen, if the provider determines that necessary. Carl declined appointment, stated ok to discard urine specimen and he will further discuss at appointment later this month.   Provider was notified of interaction

## 2024-08-11 ENCOUNTER — Ambulatory Visit: Payer: Self-pay | Admitting: Nurse Practitioner

## 2024-08-11 DIAGNOSIS — Z85828 Personal history of other malignant neoplasm of skin: Secondary | ICD-10-CM | POA: Diagnosis not present

## 2024-08-11 DIAGNOSIS — L814 Other melanin hyperpigmentation: Secondary | ICD-10-CM | POA: Diagnosis not present

## 2024-08-11 DIAGNOSIS — L821 Other seborrheic keratosis: Secondary | ICD-10-CM | POA: Diagnosis not present

## 2024-08-11 DIAGNOSIS — L853 Xerosis cutis: Secondary | ICD-10-CM | POA: Diagnosis not present

## 2024-08-11 DIAGNOSIS — L578 Other skin changes due to chronic exposure to nonionizing radiation: Secondary | ICD-10-CM | POA: Diagnosis not present

## 2024-08-11 DIAGNOSIS — C44722 Squamous cell carcinoma of skin of right lower limb, including hip: Secondary | ICD-10-CM | POA: Diagnosis not present

## 2024-08-11 DIAGNOSIS — D485 Neoplasm of uncertain behavior of skin: Secondary | ICD-10-CM | POA: Diagnosis not present

## 2024-08-11 DIAGNOSIS — D229 Melanocytic nevi, unspecified: Secondary | ICD-10-CM | POA: Diagnosis not present

## 2024-08-21 ENCOUNTER — Ambulatory Visit (INDEPENDENT_AMBULATORY_CARE_PROVIDER_SITE_OTHER): Payer: Medicare Other | Admitting: Nurse Practitioner

## 2024-08-21 ENCOUNTER — Encounter: Payer: Self-pay | Admitting: Nurse Practitioner

## 2024-08-21 VITALS — BP 122/80 | HR 90 | Temp 97.4°F | Ht 65.0 in | Wt 149.6 lb

## 2024-08-21 DIAGNOSIS — G4700 Insomnia, unspecified: Secondary | ICD-10-CM

## 2024-08-21 DIAGNOSIS — M810 Age-related osteoporosis without current pathological fracture: Secondary | ICD-10-CM

## 2024-08-21 DIAGNOSIS — M546 Pain in thoracic spine: Secondary | ICD-10-CM | POA: Diagnosis not present

## 2024-08-21 DIAGNOSIS — H6122 Impacted cerumen, left ear: Secondary | ICD-10-CM | POA: Diagnosis not present

## 2024-08-21 DIAGNOSIS — G301 Alzheimer's disease with late onset: Secondary | ICD-10-CM

## 2024-08-21 DIAGNOSIS — E7849 Other hyperlipidemia: Secondary | ICD-10-CM

## 2024-08-21 DIAGNOSIS — N1831 Chronic kidney disease, stage 3a: Secondary | ICD-10-CM

## 2024-08-21 DIAGNOSIS — F02B4 Dementia in other diseases classified elsewhere, moderate, with anxiety: Secondary | ICD-10-CM

## 2024-08-21 DIAGNOSIS — G8929 Other chronic pain: Secondary | ICD-10-CM

## 2024-08-21 MED ORDER — DENOSUMAB 60 MG/ML ~~LOC~~ SOSY
60.0000 mg | PREFILLED_SYRINGE | SUBCUTANEOUS | Status: DC
  Administered 2024-08-21: 60 mg via SUBCUTANEOUS

## 2024-08-21 NOTE — Progress Notes (Signed)
 Careteam: Patient Care Team: Caro Harlene POUR, NP as PCP - General (Geriatric Medicine) Darlean Ozell NOVAK, MD as Consulting Physician (Pulmonary Disease) Cleotilde Sewer, OD (Optometry) Tasia Lung, MD as Consulting Physician (Psychiatry) Trixie File, MD as Consulting Physician (Internal Medicine) Sissy Sieving, MD as Referring Physician (Orthopedic Surgery) Fleeta Smock, Lamar BROCKS, MD as Consulting Physician (Allergy and Immunology) Aneita Gwendlyn DASEN, MD (Inactive) as Consulting Physician (Gastroenterology) Arnaldo Juliene RAMAN, MD as Attending Physician (Family Medicine) Carlie Clark, MD as Consulting Physician (Otolaryngology)  PLACE OF SERVICE:  [facility]  Advanced Directive information    Allergies  Allergen Reactions   Penicillins Hives   Sulfa  Antibiotics Hives and Nausea And Vomiting    7-15    Chief Complaint  Patient presents with   Follow-up    6 month follow up with prolia  injection.    HPI:  Discussed the use of AI scribe software for clinical note transcription with the patient, who gave verbal consent to proceed.  History of Present Illness Faith Edwards is a 89 year old female with osteoporosis who presents for a six-month follow-up and Prolia  injection.  She has been experiencing back pain for the past three months, described as severe when bending down to pick up objects. The pain is primarily located in the mid to upper back. She has not used a heating pad or other treatments for this pain yet. No recent falls or difficulty swallowing pills.  She has a history of tinnitus, which contributes to her difficulty sleeping. She uses melatonin to aid sleep, though the dosage is unknown. She also experiences anxiety when alone, preferring not to be left by herself.   She has been using pure magnesium  as a substitute for magnesium  pills, consuming three glasses of twelve ounces each day, as suggested by a registered nurse advisor and for reasons including  sleep and bowel movements.  She has excessive earwax in her left ear, which has been associated with her tinnitus.   She has a history of memory loss, which her family has noticed worsening. Her daughter has observed some improvement in her communication on good days.   Review of Systems:  Review of Systems  Constitutional:  Negative for chills, fever and weight loss.  HENT:  Positive for tinnitus.   Respiratory:  Negative for cough, sputum production and shortness of breath.   Cardiovascular:  Negative for chest pain, palpitations and leg swelling.  Gastrointestinal:  Negative for abdominal pain, constipation, diarrhea and heartburn.  Genitourinary:  Negative for dysuria, frequency and urgency.  Musculoskeletal:  Negative for back pain, falls, joint pain and myalgias.  Skin: Negative.   Neurological:  Negative for dizziness and headaches.  Psychiatric/Behavioral:  Positive for memory loss. Negative for depression. The patient has insomnia.     Past Medical History:  Diagnosis Date   Anxiety    Arthritis    Asthma    Bronchitis    hx of    Chronic headaches    [facility]   COVID-19    Cystitis    1954   Depression    Ear pain    bilat    Hiatal hernia    History of blood clots    Lungs, per [facility] new patient packet    History of blood transfusion    2010   Hx of blood clots 2007 & 2010   post TKR   Hyperthyroidism    Pelvic fracture (HCC)    hx of in three places  secondary to fall    Pneumonia    hx of 1952   Pulmonary embolism (HCC) 1993   on HRT   Right wrist fracture    hx of 1995 has plate secondary to fall    Shaking    hands bilat   Shingles     per [facility] new patient packet    Shortness of breath dyspnea    Skin cancer    basal cell    Spinal stenosis    Trauma    left lower leg 10/22/2014    Urinary frequency    Urticaria    Past Surgical History:  Procedure Laterality Date   ABDOMINAL HYSTERECTOMY  1969    per [facility] new patient packet     ADENOIDECTOMY     BREAST ENHANCEMENT SURGERY  1985   CHOLECYSTECTOMY  1992    per [facility] new patient packet    COLONOSCOPY     diverticulosis   ESI   11/2013    C7-T1 ; Dr Bonner   EYE SURGERY     bilat cataract surgery    INSERTION OF MESH N/A 07/08/2015   Procedure: INSERTION OF MESH;  Surgeon: Krystal Spinner, MD;  Location: [facility];  Service: General;  Laterality: N/A;   REPLACEMENT TOTAL KNEE BILATERAL  2007 & 2010   SP FACET INJECTION  01/19/14   RC2-3,3-4,C4-5   TONSILLECTOMY  1945    per [facility] new patient packet    VENTRAL HERNIA REPAIR N/A 07/08/2015   Procedure: VENTRAL ADULT REPAIR VENTRAL INCISIONAL HERNIA REPAIR;  Surgeon: Krystal Spinner, MD;  Location: [facility];  Service: General;  Laterality: N/A;   WRIST FRACTURE SURGERY Bilateral 10  years and 2 years/   WRIST SURGERY Left 06/2016   plate    Social History:   reports that she has never smoked. She has never used smokeless tobacco. She reports that she does not currently use alcohol after a past usage of about 1.0 standard drink of alcohol per week. She reports that she does not use drugs.  Family History  Problem Relation Age of Onset   Skin cancer Mother    Pneumonia Mother        1/2 lung loss per [facility] New Patient Packet    Clotting disorder Father        PTE X 2   Skin cancer Father    Heart failure Father        Per [facility] New Patient Packet    Allergies Brother    Asthma Brother    Heart attack Brother        > 55   Bladder Cancer Brother    Diabetes Daughter    Migraines Daughter    Heart attack Daughter    Breast cancer Daughter    Schizophrenia Son        Per [facility] New Patient Packet    Lung disease Son        Per [facility] New Patient Packet    Mental illness Son        suicide by hanging   Diabetes Son    Hepatitis C Son    Sudden death Other        2 M aunts & 2 M uncles in 28s   Stroke Neg Hx     Medications: Patient's Medications  New Prescriptions   No medications on file  Previous Medications    ATORVASTATIN  (LIPITOR) 20 MG TABLET    Take 1 tablet (20 mg total)  by mouth daily.   CALCIUM  CARBONATE (CALCIUM  600) 600 MG TABS TABLET    Take 1 tablet (600 mg total) by mouth 2 (two) times daily with a meal.   CHOLECALCIFEROL (VITAMIN D ) 1000 UNITS TABLET    Take 1,000 Units by mouth daily.   CYANOCOBALAMIN  (B-12) 3000 MCG SUBL    Place 1 tablet under the tongue daily.   DENOSUMAB  (PROLIA ) 60 MG/ML SOSY INJECTION    Inject 60 mg into the skin every 6 (six) months.   HYDROCORTISONE  (ANUSOL -HC) 2.5 % RECTAL CREAM    Place 1 Application rectally 2 (two) times daily as needed for hemorrhoids or anal itching.   ZINC  GLUCONATE 50 MG TABLET    Take 50 mg by mouth daily.  Modified Medications   No medications on file  Discontinued Medications   No medications on file    Physical Exam:  Vitals:   08/21/24 1156  BP: 122/80  Pulse: 90  Temp: (!) 97.4 F (36.3 C)  SpO2: 99%  Weight: 149 lb 9.6 oz (67.9 kg)  Height: 5' 5 (1.651 m)   Body mass index is 24.89 kg/m. Wt Readings from Last 3 Encounters:  08/21/24 149 lb 9.6 oz (67.9 kg)  03/16/24 165 lb 3.2 oz (74.9 kg)  02/21/24 168 lb 9.6 oz (76.5 kg)    Physical Exam Constitutional:      General: She is not in acute distress.    Appearance: She is well-developed. She is not diaphoretic.  HENT:     Head: Normocephalic and atraumatic.     Mouth/Throat:     Pharynx: No oropharyngeal exudate.  Eyes:     Conjunctiva/sclera: Conjunctivae normal.     Pupils: Pupils are equal, round, and reactive to light.  Cardiovascular:     Rate and Rhythm: Normal rate and regular rhythm.     Heart sounds: Normal heart sounds.  Pulmonary:     Effort: Pulmonary effort is normal.     Breath sounds: Normal breath sounds.  Abdominal:     General: Bowel sounds are normal.     Palpations: Abdomen is soft.  Musculoskeletal:     Cervical back: Normal range of motion and neck supple.     Right lower leg: No edema.     Left lower leg: No edema.  Skin:     General: Skin is warm and dry.  Neurological:     Mental Status: She is alert. Mental status is at baseline.     Motor: No weakness.  Psychiatric:        Mood and Affect: Mood normal.     Labs reviewed: Basic Metabolic Panel: Recent Labs    02/07/24 0949 02/21/24 1517 08/10/24 1004  NA 139  --  137  K 4.6  --  4.8  CL 104  --  104  CO2 26  --  23  GLUCOSE 111*  --  101*  BUN 21  --  25  CREATININE 1.08*  --  1.05*  CALCIUM  9.9  --  9.4  TSH  --  3.72  --    Liver Function Tests: Recent Labs    02/07/24 0949 08/10/24 1004  AST 19 25  ALT 19 23  BILITOT 0.8 1.1  PROT 6.9 6.5   No results for input(s): LIPASE, AMYLASE in the last 8760 hours. No results for input(s): AMMONIA in the last 8760 hours. CBC: Recent Labs    02/21/24 1517 08/10/24 1004  WBC 6.9 8.2  NEUTROABS 3,664 5,797  HGB 13.2 12.8  HCT 39.9 39.2  MCV 96.6 99.5  PLT 290 269   Lipid Panel: Recent Labs    08/10/24 1004  CHOL 152  HDL 75  LDLCALC 61  TRIG 78  CHOLHDL 2.0   TSH: Recent Labs    02/21/24 1517  TSH 3.72   A1C: Lab Results  Component Value Date   HGBA1C 5.6 01/05/2021     Assessment/Plan  Age-related osteoporosis without current pathological fracture Assessment & Plan: Continues on prolia  every 6 months with cal and vit d Continues weight bearing exercise daily   Orders: -     Denosumab   Impacted cerumen of left ear Assessment & Plan: Unable to tolerate ear lavage Can use debrox at home- instructions provided.    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 Discussed medication to slow the progression of disease Wants to discuss and let us  know. Continue supportive care.    Other hyperlipidemia Assessment & Plan: Continues on Lipitor. Improvement in LDL as she is taking medication routinely at this time.    Chronic bilateral thoracic back pain Assessment & Plan: Occasional pain, To use heating pad  TID PRN Can use tylenol  325 mg 2 tablets PRN May benefit from some PT if interested if pain worsens.    Insomnia, unspecified type Assessment & Plan: Ongoing, taking melatonin but unsure of dose. Will notify    Stage 3a chronic kidney disease [facility]) Assessment & Plan: Chronic and stable Encourage proper hydration Follow metabolic panel Avoid nephrotoxic meds (NSAIDS)    Return in about 6 months (around 02/21/2025) for routine follow up with prolia , labs prior to visit-.  Lavonne Kinderman K. Caro BODILY [facility] [phone]

## 2024-08-21 NOTE — Patient Instructions (Addendum)
 1.) Stop at checkout to schedule an annual wellness visit (video or in person)   2.) Visit your local pharmacy to receive covid boosters   How many mg of melatonin are you taking? To use a sound machine to help with sleep and ringing in the ear  To use tylenol  325 mg 2 tablet every 6 hours as needed pain For back pain use heating pad- 2-3 times daily and can use a muscle rub AFTER heat- icyhot, biofreeze  ARICEPT and NAMENDA are medications to help SLOW progression of memory loss  Debrox is over the counter- to use 5-10 drops twice daily for 4 days- you can come back to office after this to have her evaluated and to flush if needed

## 2024-08-24 ENCOUNTER — Telehealth: Payer: Self-pay | Admitting: *Deleted

## 2024-08-24 NOTE — Telephone Encounter (Signed)
 Tried calling patient to relay Faith Edwards's message. LMOM to return call.

## 2024-08-24 NOTE — Telephone Encounter (Signed)
 Received a note on Clinical intake desk.  Note from patient stating Melatonin 10mg  (2per night) I did not know this on Friday  Per [name]: Would recommend DECREASE to 1mg  by mouth daily. Too much can cause MORE sleep issues.

## 2024-08-25 ENCOUNTER — Ambulatory Visit (INDEPENDENT_AMBULATORY_CARE_PROVIDER_SITE_OTHER): Admitting: Family Medicine

## 2024-08-25 ENCOUNTER — Encounter: Payer: Self-pay | Admitting: Family Medicine

## 2024-08-25 ENCOUNTER — Ambulatory Visit: Payer: Self-pay

## 2024-08-25 VITALS — BP 122/76 | HR 88 | Temp 97.8°F | Resp 18 | Ht 65.0 in | Wt 149.8 lb

## 2024-08-25 DIAGNOSIS — G301 Alzheimer's disease with late onset: Secondary | ICD-10-CM | POA: Insufficient documentation

## 2024-08-25 DIAGNOSIS — H6122 Impacted cerumen, left ear: Secondary | ICD-10-CM | POA: Insufficient documentation

## 2024-08-25 DIAGNOSIS — G4700 Insomnia, unspecified: Secondary | ICD-10-CM | POA: Insufficient documentation

## 2024-08-25 DIAGNOSIS — M810 Age-related osteoporosis without current pathological fracture: Secondary | ICD-10-CM | POA: Insufficient documentation

## 2024-08-25 DIAGNOSIS — R1013 Epigastric pain: Secondary | ICD-10-CM | POA: Insufficient documentation

## 2024-08-25 DIAGNOSIS — K5904 Chronic idiopathic constipation: Secondary | ICD-10-CM | POA: Diagnosis not present

## 2024-08-25 DIAGNOSIS — G8929 Other chronic pain: Secondary | ICD-10-CM | POA: Insufficient documentation

## 2024-08-25 MED ORDER — MELATONIN 1 MG PO TABS: 1.0000 mg | ORAL_TABLET | Freq: Every day | ORAL | Status: DC

## 2024-08-25 MED ORDER — HYOSCYAMINE SULFATE SL 0.125 MG SL SUBL
0.1250 mg | SUBLINGUAL_TABLET | Freq: Two times a day (BID) | SUBLINGUAL | 0 refills | Status: DC | PRN
Start: 2024-08-25 — End: 2024-09-24

## 2024-08-25 MED ORDER — ESOMEPRAZOLE MAGNESIUM 40 MG PO CPDR: 40.0000 mg | DELAYED_RELEASE_CAPSULE | Freq: Every day | ORAL | 3 refills | Status: DC

## 2024-08-25 NOTE — Assessment & Plan Note (Signed)
 Chronic and stable Encourage proper hydration Follow metabolic panel Avoid nephrotoxic meds (NSAIDS)

## 2024-08-25 NOTE — Progress Notes (Signed)
 Chief Complaint  Patient presents with   Acute Visit    Stomach pain // 3 days // pt hasn't took any medication for it and only is in pain when she's standing up.     HPI: Patient is a 89 y.o. female seen today for an acute visit for upper abdominal pain.  History is not very helpful and is inconsistent. Accompanied by husband. Not related to eating. No bowel or bladder sx. May be worsened by walking.. S/P cholecycstectomy. Unsure of other Abdominal surgery.  Past Medical History:  Diagnosis Date   Anxiety    Arthritis    Asthma    Bronchitis    hx of    Chronic headaches    [facility]   COVID-19    Cystitis    1954   Depression    Ear pain    bilat    Hiatal hernia    History of blood clots    Lungs, per [facility] new patient packet    History of blood transfusion    2010   Hx of blood clots 2007 & 2010   post TKR   Hyperthyroidism    Pelvic fracture (HCC)    hx of in three places secondary to fall    Pneumonia    hx of 1952   Pulmonary embolism (HCC) 1993   on HRT   Right wrist fracture    hx of 1995 has plate secondary to fall    Shaking    hands bilat   Shingles     per [facility] new patient packet    Shortness of breath dyspnea    Skin cancer    basal cell    Spinal stenosis    Trauma    left lower leg 10/22/2014    Urinary frequency    Urticaria     Past Surgical History:  Procedure Laterality Date   ABDOMINAL HYSTERECTOMY  1969    per [facility] new patient packet    ADENOIDECTOMY     BREAST ENHANCEMENT SURGERY  1985   CHOLECYSTECTOMY  1992    per [facility] new patient packet    COLONOSCOPY     diverticulosis   ESI   11/2013    C7-T1 ; Dr Bonner   EYE SURGERY     bilat cataract surgery    INSERTION OF MESH N/A 07/08/2015   Procedure: INSERTION OF MESH;  Surgeon: Krystal Spinner, MD;  Location: [facility];  Service: General;  Laterality: N/A;   REPLACEMENT TOTAL KNEE BILATERAL  2007 & 2010   SP FACET INJECTION  01/19/14   RC2-3,3-4,C4-5   TONSILLECTOMY  1945     per [facility] new patient packet    VENTRAL HERNIA REPAIR N/A 07/08/2015   Procedure: VENTRAL ADULT REPAIR VENTRAL INCISIONAL HERNIA REPAIR;  Surgeon: Krystal Spinner, MD;  Location: [facility];  Service: General;  Laterality: N/A;   WRIST FRACTURE SURGERY Bilateral 10  years and 2 years/   WRIST SURGERY Left 06/2016   plate     Allergies  Allergen Reactions   Penicillins Hives   Sulfa  Antibiotics Hives and Nausea And Vomiting    7-15    Outpatient Encounter Medications as of 08/25/2024  Medication Sig   atorvastatin  (LIPITOR) 20 MG tablet Take 1 tablet (20 mg total) by mouth daily.   calcium  carbonate (CALCIUM  600) 600 MG TABS tablet Take 1 tablet (600 mg total) by mouth 2 (two) times daily with a meal.   cholecalciferol (VITAMIN D ) 1000 UNITS  tablet Take 1,000 Units by mouth daily.   Cyanocobalamin  (B-12) 3000 MCG SUBL Place 1 tablet under the tongue daily.   denosumab  (PROLIA ) 60 MG/ML SOSY injection Inject 60 mg into the skin every 6 (six) months.   esomeprazole  (NEXIUM ) 40 MG capsule Take 1 capsule (40 mg total) by mouth daily.   hydrocortisone  (ANUSOL -HC) 2.5 % rectal cream Place 1 Application rectally 2 (two) times daily as needed for hemorrhoids or anal itching.   Hyoscyamine  Sulfate SL (LEVSIN /SL) 0.125 MG SUBL Place 1 tablet (0.125 mg total) under the tongue 2 (two) times daily as needed.   melatonin 1 MG TABS tablet Take 1 tablet (1 mg total) by mouth at bedtime.   UNABLE TO FIND Med Name: Magnesium  Pure (powder) add to 12 oz of water three times daily (1/2 teaspoon)   zinc  gluconate 50 MG tablet Take 50 mg by mouth daily.   Facility-Administered Encounter Medications as of 08/25/2024  Medication   denosumab  (PROLIA ) injection 60 mg   denosumab  (PROLIA ) injection 60 mg   [START ON 01/22/2025] denosumab  (PROLIA ) injection 60 mg    Review of Systems:  Review of Systems  Constitutional: Negative.   Respiratory: Negative.    Genitourinary: Negative.   Neurological: Negative.    Psychiatric/Behavioral:  Positive for confusion and decreased concentration.     Physical Exam: Vitals:   08/25/24 1020  BP: 122/76  Pulse: 88  Resp: 18  Temp: 97.8 F (36.6 C)  SpO2: 98%  Weight: 149 lb 12.8 oz (67.9 kg)  Height: 5' 5 (1.651 m)   Body mass index is 24.93 kg/m. Physical Exam Constitutional:      Appearance: Normal appearance.  Cardiovascular:     Rate and Rhythm: Normal rate.  Pulmonary:     Effort: Pulmonary effort is normal.     Breath sounds: Normal breath sounds.  Abdominal:     General: Abdomen is flat. Bowel sounds are normal. There is no distension.     Palpations: Abdomen is soft. There is no mass.     Tenderness: There is abdominal tenderness. There is no guarding.     Hernia: No hernia is present.     Comments: Exam reveals soft abdomen; bowel sounds present No mass, organomegaly LLQ nontender eg not consistent with divertic disease  Neurological:     Mental Status: She is alert.     Assessment/Plan 1. Chronic idiopathic constipation (Primary) Denis current sx; exam not suggestive  2. Epigastric pain Will try PPI and antispasmodic for few days  Consider US  if no better. Do feel palpable aorta    Garnette [name], MD [name] [address] [place], [place] [address] Office [phone]

## 2024-08-25 NOTE — Assessment & Plan Note (Signed)
 Continues on prolia  every 6 months with cal and vit d Continues weight bearing exercise daily

## 2024-08-25 NOTE — Telephone Encounter (Signed)
 FYI Only or Action Required?: Action required by provider: request for appointment.  Patient was last seen in primary care on 08/21/2024 by Caro Harlene POUR, NP.  Called Nurse Triage reporting Abdominal Pain.  Symptoms began several days ago.  Interventions attempted: Nothing.  Symptoms are: unchanged.  Triage Disposition: See HCP Within 4 Hours (Or PCP Triage)  Patient/caregiver understands and will follow disposition?: YesCopied from CRM #[id number]. Topic: Clinical - Red Word Triage >> Aug 25, 2024  9:35 AM Faith Edwards wrote: Red Word that prompted transfer to Nurse Triage: Patient, along with her husband, calling in stating that her stomach has been hurting for about 3 days now. States it's the lower part of her stomach but there is no nausea or vomiting. Reason for Disposition  [1] MILD-MODERATE pain AND [2] constant AND [3] present > 2 hours  Answer Assessment - Initial Assessment Questions Pain only while walking. Pt feels better while lying down. Pt had hard time hearing triage questions over phone.      1. LOCATION: Where does it hurt?      Lower stomach 2. RADIATION: Does the pain shoot anywhere else? (e.g., chest, back)     denies 3. ONSET: When did the pain begin? (e.g., minutes, hours or days ago)      3 days ago 4. SUDDEN: Gradual or sudden onset?     na 5. PATTERN Does the pain come and go, or is it constant?     Comes and goes 6. SEVERITY: How bad is the pain?  (e.g., Scale 1-10; mild, moderate, or severe)     8 7. RECURRENT SYMPTOM: Have you ever had this type of stomach pain before? If Yes, ask: When was the last time? and What happened that time?      denies 8. CAUSE: What do you think is causing the stomach pain? (e.g., gallstones, recent abdominal surgery)     Not sure 9. RELIEVING/AGGRAVATING FACTORS: What makes it better or worse? (e.g., antacids, bending or twisting motion, bowel movement)     Walking is worse 10. OTHER SYMPTOMS:  Do you have any other symptoms? (e.g., back pain, diarrhea, fever, urination pain, vomiting)       denies  Protocols used: Abdominal Pain - Female-A-AH

## 2024-08-25 NOTE — Assessment & Plan Note (Signed)
 Unable to tolerate ear lavage Can use debrox at home- instructions provided.

## 2024-08-25 NOTE — Assessment & Plan Note (Signed)
 Ongoing. Discussed medication to slow the progression of disease Wants to discuss and let us  know. Continue supportive care.

## 2024-08-25 NOTE — Assessment & Plan Note (Signed)
 Occasional pain, To use heating pad TID PRN Can use tylenol  325 mg 2 tablets PRN May benefit from some PT if interested if pain worsens.

## 2024-08-25 NOTE — Telephone Encounter (Signed)
 Spoke with patients spouse, [name] and informed him of Faith Harlene POUR, NP response to melatonin and he verbalized understanding.

## 2024-08-25 NOTE — Assessment & Plan Note (Signed)
 Ongoing, taking melatonin but unsure of dose. Will notify

## 2024-08-25 NOTE — Assessment & Plan Note (Signed)
 Continues on Lipitor. Improvement in LDL as she is taking medication routinely at this time.

## 2024-09-02 ENCOUNTER — Telehealth: Payer: Self-pay

## 2024-09-02 MED ORDER — DENOSUMAB 60 MG/ML ~~LOC~~ SOSY: 60.0000 mg | PREFILLED_SYRINGE | SUBCUTANEOUS | Status: AC

## 2024-09-02 NOTE — Telephone Encounter (Signed)
 Spoke with patient spouse and that he agree on the referral to neurology and there was no opening to make appointment with Faith Harlene POUR, Faith Edwards because patient and spouse will be out of town until Monday. The spouse stated that he would like to see Faith Harlene POUR, Faith Edwards but did not have any openings but I did advise that one of our provider Jereld does have a openings on Monday and want to see that is okay with Faith Harlene POUR, Faith Edwards    Message sent to Faith Harlene POUR, Faith Edwards

## 2024-09-02 NOTE — Addendum Note (Signed)
 Addended by: SUELLEN DEVIN BROCKS on: 09/02/2024 10:17 AM   Modules accepted: Orders

## 2024-09-02 NOTE — Telephone Encounter (Signed)
 Copied from CRM #[id number]. Topic: Clinical - Medical Advice >> Sep 02, 2024  8:47 AM Diannia H wrote: Reason for CRM: Patient husband called in and wanted to express some concerns about the patient. He is wanting to know does she need to go to see a neurologist because it was disclosed in some of her notes way back and if so who would it be that she will see. Second thing is a anti depression medicines and wants to find out more about that and wants her to get examined for her depression. Third thing is he is wanting to know what the side effects of anti depression medicine and how will you determine which meds she will take. Could you assist? Patients callback is [phone].

## 2024-09-02 NOTE — Addendum Note (Signed)
 Addended by: SUELLEN DEVIN BROCKS on: 09/02/2024 10:11 AM   Modules accepted: Orders

## 2024-09-02 NOTE — Telephone Encounter (Signed)
 Left message on voicemail for patient to return call when available

## 2024-09-02 NOTE — Addendum Note (Signed)
 Addended by: LYNNANN COUGH A on: 09/02/2024 10:17 AM   Modules accepted: Orders

## 2024-09-02 NOTE — Telephone Encounter (Signed)
 We can certainly discuss antidepressant, would recommend a visit to discuss side effects and medication.  Also if he would like us  to I can place referral to neurology

## 2024-09-03 DIAGNOSIS — C44722 Squamous cell carcinoma of skin of right lower limb, including hip: Secondary | ICD-10-CM | POA: Diagnosis not present

## 2024-09-03 NOTE — Telephone Encounter (Signed)
 Yes okay to see another provider

## 2024-09-03 NOTE — Telephone Encounter (Signed)
 Great - thanks

## 2024-09-03 NOTE — Telephone Encounter (Signed)
 I called patient husband and offered appointment for today. He politely declined. He stated that he has availability tomorrow to speak about his concerns of wife. Patient scheduled with PCP Caro Harlene POUR, NP tomorrow at 2pm. Message routed as RICK.

## 2024-09-03 NOTE — Telephone Encounter (Signed)
 Copied from CRM [id number]. Topic: Clinical - Medical Advice >> Sep 03, 2024  9:00 AM Merlynn LABOR wrote: Reason for CRM: Patients husband [name] called in requesting call from Dr. Caro to discuss concerns about wife. Please contact at [phone].

## 2024-09-04 ENCOUNTER — Encounter: Payer: Self-pay | Admitting: Nurse Practitioner

## 2024-09-04 ENCOUNTER — Ambulatory Visit (INDEPENDENT_AMBULATORY_CARE_PROVIDER_SITE_OTHER): Admitting: Nurse Practitioner

## 2024-09-04 VITALS — BP 110/60 | HR 90 | Temp 97.7°F | Ht 65.0 in | Wt 148.6 lb

## 2024-09-04 DIAGNOSIS — G301 Alzheimer's disease with late onset: Secondary | ICD-10-CM

## 2024-09-04 DIAGNOSIS — F02B4 Dementia in other diseases classified elsewhere, moderate, with anxiety: Secondary | ICD-10-CM

## 2024-09-04 DIAGNOSIS — F419 Anxiety disorder, unspecified: Secondary | ICD-10-CM

## 2024-09-04 DIAGNOSIS — Z780 Asymptomatic menopausal state: Secondary | ICD-10-CM

## 2024-09-04 MED ORDER — SERTRALINE HCL 25 MG PO TABS: 25.0000 mg | ORAL_TABLET | Freq: Every day | ORAL | 1 refills | Status: DC

## 2024-09-04 NOTE — Patient Instructions (Signed)
 Will start zoloft  25 mg by mouth daily-- this is to help with anxiety and crying episodes.  Can take this in the morning or in the evening.  Continue to take melatonin 1 mg daily- okay to give additional dose to help with sleep.   Aricept and namenda are given to preserve memory loss.  We can start at next visit.

## 2024-09-04 NOTE — Progress Notes (Signed)
 Careteam: Patient Care Team: Caro Harlene POUR, NP as PCP - General (Geriatric Medicine) Darlean Ozell NOVAK, MD as Consulting Physician (Pulmonary Disease) Cleotilde Sewer, OD (Optometry) Tasia Lung, MD as Consulting Physician (Psychiatry) Trixie File, MD as Consulting Physician (Internal Medicine) Sissy Sieving, MD as Referring Physician (Orthopedic Surgery) Fleeta Smock, Lamar BROCKS, MD as Consulting Physician (Allergy and Immunology) Aneita Gwendlyn DASEN, MD (Inactive) as Consulting Physician (Gastroenterology) Arnaldo Juliene RAMAN, MD as Attending Physician (Family Medicine) Carlie Clark, MD as Consulting Physician (Otolaryngology)  PLACE OF SERVICE:  [facility]  Advanced Directive information    Allergies  Allergen Reactions   Penicillins Hives   Sulfa  Antibiotics Hives and Nausea And Vomiting    7-15    Chief Complaint  Patient presents with   Medical Management of Chronic Issues    Recommendation for neurology. Having trouble sleeping. Feeling very emotional and crying more than normal.AWV scheduled for 10/30/24(postponed). Covid    HPI:  Discussed the use of AI scribe software for clinical note transcription with the patient, who gave verbal consent to proceed.  History of Present Illness Faith Edwards is a 89 year old female who presents with concerns of depression and crying spells. She is accompanied by her spouse, who is also her primary caregiver.  She has been experiencing depression and frequent crying spells, which have become more constant over the past week. Her spouse notes that she cries often without a specific trigger and has difficulty sleeping, often waking up at night and struggling to return to sleep. She has been taking melatonin, reduced from 10 mg to 1 mg, and typically takes two doses at night.  She has a progressive memory loss with dementia. A CT scan has been performed previously. Her spouse reports that she was very independent so this is a  change.   She experiences tinnitus, described as 'ringing or buzzing' in her ears, which has remained unchanged. She wakes up with this sensation, and it has been discussed previously. Has seen ENT.   Current medications include melatonin for sleep.    Review of Systems:  Review of Systems  Constitutional:  Negative for chills, fever and weight loss.  HENT:  Positive for tinnitus.   Skin: Negative.   Neurological:  Negative for dizziness and headaches.  Psychiatric/Behavioral:  Positive for depression and memory loss. The patient is nervous/anxious and has insomnia.     Past Medical History:  Diagnosis Date   Anxiety    Arthritis    Asthma    Bronchitis    hx of    Chronic headaches    [facility]   COVID-19    Cystitis    1954   Depression    Ear pain    bilat    Hiatal hernia    History of blood clots    Lungs, per [facility] new patient packet    History of blood transfusion    2010   Hx of blood clots 2007 & 2010   post TKR   Hyperthyroidism    Pelvic fracture (HCC)    hx of in three places secondary to fall    Pneumonia    hx of 1952   Pulmonary embolism (HCC) 1993   on HRT   Right wrist fracture    hx of 1995 has plate secondary to fall    Shaking    hands bilat   Shingles     per [facility] new patient packet    Shortness of breath  dyspnea    Skin cancer    basal cell    Spinal stenosis    Trauma    left lower leg 10/22/2014    Urinary frequency    Urticaria    Past Surgical History:  Procedure Laterality Date   ABDOMINAL HYSTERECTOMY  1969    per [facility] new patient packet    ADENOIDECTOMY     BREAST ENHANCEMENT SURGERY  1985   Cancer removal Right    Right leg (Info given by patient)   CHOLECYSTECTOMY  1992    per [facility] new patient packet    COLONOSCOPY     diverticulosis   ESI  11/2013    C7-T1 ; Dr Bonner   EYE SURGERY     bilat cataract surgery    INSERTION OF MESH N/A 07/08/2015   Procedure: INSERTION OF MESH;  Surgeon: Krystal Spinner, MD;   Location: [facility];  Service: General;  Laterality: N/A;   REPLACEMENT TOTAL KNEE BILATERAL  2007 & 2010   SP FACET INJECTION  01/19/2014   RC2-3,3-4,C4-5   TONSILLECTOMY  1945    per [facility] new patient packet    VENTRAL HERNIA REPAIR N/A 07/08/2015   Procedure: VENTRAL ADULT REPAIR VENTRAL INCISIONAL HERNIA REPAIR;  Surgeon: Krystal Spinner, MD;  Location: [facility];  Service: General;  Laterality: N/A;   WRIST FRACTURE SURGERY Bilateral 10  years and 2 years/   WRIST SURGERY Left 06/2016   plate    Social History:   reports that she has never smoked. She has never used smokeless tobacco. She reports that she does not currently use alcohol after a past usage of about 1.0 standard drink of alcohol per week. She reports that she does not use drugs.  Family History  Problem Relation Age of Onset   Skin cancer Mother    Pneumonia Mother        1/2 lung loss per [facility] New Patient Packet    Clotting disorder Father        PTE X 2   Skin cancer Father    Heart failure Father        Per [facility] New Patient Packet    Allergies Brother    Asthma Brother    Heart attack Brother        > 55   Bladder Cancer Brother    Diabetes Daughter    Migraines Daughter    Heart attack Daughter    Breast cancer Daughter    Schizophrenia Son        Per [facility] New Patient Packet    Lung disease Son        Per [facility] New Patient Packet    Mental illness Son        suicide by hanging   Diabetes Son    Hepatitis C Son    Sudden death Other        2 M aunts & 2 M uncles in 49s   Stroke Neg Hx     Medications: Patient's Medications  New Prescriptions   No medications on file  Previous Medications   ATORVASTATIN  (LIPITOR) 20 MG TABLET    Take 1 tablet (20 mg total) by mouth daily.   CALCIUM  CARBONATE (CALCIUM  600) 600 MG TABS TABLET    Take 1 tablet (600 mg total) by mouth 2 (two) times daily with a meal.   CHOLECALCIFEROL (VITAMIN D ) 1000 UNITS TABLET    Take 1,000 Units by mouth daily.   CYANOCOBALAMIN  (B-12) 1000  MCG CHEW    Chew 2,000 mcg by mouth daily.   CYANOCOBALAMIN  (B-12) 3000 MCG SUBL    Place 1 tablet under the tongue daily.   DENOSUMAB  (PROLIA ) 60 MG/ML SOSY INJECTION    Inject 60 mg into the skin every 6 (six) months.   ESOMEPRAZOLE  (NEXIUM ) 40 MG CAPSULE    Take 1 capsule (40 mg total) by mouth daily.   HYDROCORTISONE  (ANUSOL -HC) 2.5 % RECTAL CREAM    Place 1 Application rectally 2 (two) times daily as needed for hemorrhoids or anal itching.   HYOSCYAMINE  SULFATE SL (LEVSIN /SL) 0.125 MG SUBL    Place 1 tablet (0.125 mg total) under the tongue 2 (two) times daily as needed.   MELATONIN 1 MG TABS TABLET    Take 1 tablet (1 mg total) by mouth at bedtime.   UNABLE TO FIND    Med Name: Magnesium  Pure (powder) add to 12 oz of water three times daily (1/4 teaspoon)   ZINC  GLUCONATE 50 MG TABLET    Take 50 mg by mouth daily.  Modified Medications   No medications on file  Discontinued Medications   No medications on file    Physical Exam:  Vitals:   09/03/24 1646  BP: 110/60  Pulse: 90  Temp: 97.7 F (36.5 C)  SpO2: 96%  Weight: 148 lb 9.6 oz (67.4 kg)  Height: 5' 5 (1.651 m)   Body mass index is 24.93 kg/m. Wt Readings from Last 3 Encounters:  08/25/24 149 lb 12.8 oz (67.9 kg)  08/21/24 149 lb 9.6 oz (67.9 kg)  03/16/24 165 lb 3.2 oz (74.9 kg)    Physical Exam Constitutional:      Appearance: Normal appearance.  Pulmonary:     Effort: Pulmonary effort is normal.  Neurological:     Mental Status: She is alert. Mental status is at baseline.  Psychiatric:        Mood and Affect: Mood normal.     Labs reviewed: Basic Metabolic Panel: Recent Labs    02/07/24 0949 02/21/24 1517 08/10/24 1004  NA 139  --  137  K 4.6  --  4.8  CL 104  --  104  CO2 26  --  23  GLUCOSE 111*  --  101*  BUN 21  --  25  CREATININE 1.08*  --  1.05*  CALCIUM  9.9  --  9.4  TSH  --  3.72  --    Liver Function Tests: Recent Labs    02/07/24 0949 08/10/24 1004  AST 19 25  ALT 19 23   BILITOT 0.8 1.1  PROT 6.9 6.5   No results for input(s): LIPASE, AMYLASE in the last 8760 hours. No results for input(s): AMMONIA in the last 8760 hours. CBC: Recent Labs    02/21/24 1517 08/10/24 1004  WBC 6.9 8.2  NEUTROABS 3,664 5,797  HGB 13.2 12.8  HCT 39.9 39.2  MCV 96.6 99.5  PLT 290 269   Lipid Panel: Recent Labs    08/10/24 1004  CHOL 152  HDL 75  LDLCALC 61  TRIG 78  CHOLHDL 2.0   TSH: Recent Labs    02/21/24 1517  TSH 3.72   A1C: Lab Results  Component Value Date   HGBA1C 5.6 01/05/2021     Assessment/Plan Assessment and Plan Assessment & Plan Depression and Anxiety with Insomnia Increased crying and sleep difficulties. Discussed antidepressants for mood and sleep. Trazodone considered for sedative effects. SSRIs discussed for mood. Prioritized treatment for mood disturbances. -will  start with zoloft  25 mg by mouth daily - Follow up in four weeks to assess mood and consider memory-preserving medications if mood improves.  Follow up bmp in 4 weeks.   Alzheimer's disease with late onset Progressive memory loss. Discussed Aricept and Namenda for disease progression. Aricept may reduce anxiety. Discussed Aricept side effects. Decision to start with Aricept. - Prescribe Aricept 5 mg at bedtime at next visit  - educated about side effects such as diarrhea and decreased heart rate. -would like to defer neurology referral at this time -wants more help at home however having trouble getting this set up, he has been in touch with insurance but unable to get home care.  -sw referral to help with this.   Follow-Up Plan to monitor response to treatment and adjust medications. - Schedule follow-up appointment in four weeks. - Recheck lab work at follow-up to ensure medication safety.   Return in about 4 weeks (around 10/02/2024) for mood, labs at time of visit.:   Lonisha Bobby K. Caro BODILY [facility] [phone]    Total time :  time greater than 50% of total time spent doing pt counseling and coordination of care regarding dementia and mood

## 2024-09-05 ENCOUNTER — Ambulatory Visit (HOSPITAL_COMMUNITY): Admission: EM | Admit: 2024-09-05 | Discharge: 2024-09-05 | Disposition: A | Discharge status: Home / Self Care

## 2024-09-05 DIAGNOSIS — Z5189 Encounter for other specified aftercare: Secondary | ICD-10-CM

## 2024-09-05 NOTE — Discharge Instructions (Signed)
 Keep clean, change dressings. Keep follow up with dermatology

## 2024-09-05 NOTE — ED Triage Notes (Signed)
 Patient presents for a wound check on her right lower leg. Patient had abnormal cells remove from her leg and it looks infected.

## 2024-09-05 NOTE — ED Provider Notes (Signed)
 [facility]    CSN: [id number] Arrival date & time: 09/05/24  1656      History   Chief Complaint Chief Complaint  Patient presents with   Wound Check    HPI JOYCELYNN Edwards is a 89 y.o. female.   Patient presents for wound check to her right lower posterior leg.  She had a cancerous lesion removed by dermatology 3 days ago.  Patient's husband/caregiver reports that someone was supposed to come to the home today to change the dressing as directed by dermatology but they did not come.  Patient reports minimal pain.  No drainage or bleeding noted.    Past Medical History:  Diagnosis Date   Anxiety    Arthritis    Asthma    Bronchitis    hx of    Chronic headaches    [facility]   COVID-19    Cystitis    1954   Depression    Ear pain    bilat    Hiatal hernia    History of blood clots    Lungs, per [facility] new patient packet    History of blood transfusion    2010   Hx of blood clots 2007 & 2010   post TKR   Hyperthyroidism    Pelvic fracture (HCC)    hx of in three places secondary to fall    Pneumonia    hx of 1952   Pulmonary embolism (HCC) 1993   on HRT   Right wrist fracture    hx of 1995 has plate secondary to fall    Shaking    hands bilat   Shingles     per [facility] new patient packet    Shortness of breath dyspnea    Skin cancer    basal cell    Spinal stenosis    Trauma    left lower leg 10/22/2014    Urinary frequency    Urticaria     Patient Active Problem List   Diagnosis Date Noted   Age-related osteoporosis without current pathological fracture 08/25/2024   Moderate late onset Alzheimer's dementia with anxiety (HCC) 08/25/2024   Impacted cerumen of left ear 08/25/2024   Chronic bilateral thoracic back pain 08/25/2024   Insomnia 08/25/2024   Chronic idiopathic constipation 08/25/2024   Epigastric pain 08/25/2024   Dysuria 02/20/2022   Hypothyroidism 02/12/2022   Ataxia 01/30/2022   Allergic rhinitis 10/13/2019   Multiple  lung nodules on CT 07/29/2018   Other chest pain 07/28/2018   Prediabetes 05/09/2017   CKD (chronic kidney disease) stage 3, GFR 30-59 ml/min (HCC) 11/08/2016   Herpes zoster 06/02/2016   Upper airway cough syndrome 06/01/2016   Hyperlipidemia 03/22/2016   GERD (gastroesophageal reflux disease) 03/15/2016   Graves disease 01/04/2016   Osteopenia, severe 10/14/2015   Other constipation 09/01/2015   Incisional hernia 07/08/2015   Ventral hernia without obstruction or gangrene 05/12/2015   Lower back pain 05/12/2015   Spinal stenosis in cervical region 11/26/2013   Cough 03/07/2012   Depression 08/05/2008   Personal history of venous thrombosis and embolism 08/05/2008   Chronic rhinitis 12/29/2007    Past Surgical History:  Procedure Laterality Date   ABDOMINAL HYSTERECTOMY  1969    per [facility] new patient packet    ADENOIDECTOMY     BREAST ENHANCEMENT SURGERY  1985   Cancer removal Right    Right leg (Info given by patient)   CHOLECYSTECTOMY  1992    per [facility] new  patient packet    COLONOSCOPY     diverticulosis   ESI  11/2013    C7-T1 ; Dr Bonner   EYE SURGERY     bilat cataract surgery    INSERTION OF MESH N/A 07/08/2015   Procedure: INSERTION OF MESH;  Surgeon: Krystal Spinner, MD;  Location: [facility];  Service: General;  Laterality: N/A;   REPLACEMENT TOTAL KNEE BILATERAL  2007 & 2010   SP FACET INJECTION  01/19/2014   RC2-3,3-4,C4-5   TONSILLECTOMY  1945    per [facility] new patient packet    VENTRAL HERNIA REPAIR N/A 07/08/2015   Procedure: VENTRAL ADULT REPAIR VENTRAL INCISIONAL HERNIA REPAIR;  Surgeon: Krystal Spinner, MD;  Location: [facility];  Service: General;  Laterality: N/A;   WRIST FRACTURE SURGERY Bilateral 10  years and 2 years/   WRIST SURGERY Left 06/2016   plate     OB History   No obstetric history on file.      Home Medications    Prior to Admission medications   Medication Sig Start Date End Date Taking? Authorizing Provider  atorvastatin  (LIPITOR) 20 MG tablet  Take 1 tablet (20 mg total) by mouth daily. 03/24/24   Caro Harlene POUR, NP  calcium  carbonate (CALCIUM  600) 600 MG TABS tablet Take 1 tablet (600 mg total) by mouth 2 (two) times daily with a meal. 03/24/24   Caro Harlene POUR, NP  cholecalciferol (VITAMIN D ) 1000 UNITS tablet Take 1,000 Units by mouth daily.    [provider]  Cyanocobalamin  (B-12) 1000 MCG CHEW Chew 2,000 mcg by mouth daily.    [provider]  Cyanocobalamin  (B-12) 3000 MCG SUBL Place 1 tablet under the tongue daily. Patient not taking: Reported on 09/04/2024    [provider]  denosumab  (PROLIA ) 60 MG/ML SOSY injection Inject 60 mg into the skin every 6 (six) months.    [provider]  esomeprazole  (NEXIUM ) 40 MG capsule Take 1 capsule (40 mg total) by mouth daily. 08/25/24   Cleotilde Garnette HERO, MD  hydrocortisone  (ANUSOL -HC) 2.5 % rectal cream Place 1 Application rectally 2 (two) times daily as needed for hemorrhoids or anal itching. Patient not taking: Reported on 09/04/2024 11/15/22   Avram [name] BRAVO, MD  Hyoscyamine  Sulfate SL (LEVSIN AMIEL) 0.125 MG SUBL Place 1 tablet (0.125 mg total) under the tongue 2 (two) times daily as needed. Patient not taking: Reported on 09/04/2024 08/25/24   Cleotilde Garnette HERO, MD  melatonin 1 MG TABS tablet Take 1 tablet (1 mg total) by mouth at bedtime. 08/25/24   Caro Harlene POUR, NP  sertraline  (ZOLOFT ) 25 MG tablet Take 1 tablet (25 mg total) by mouth daily. 09/04/24   Caro Harlene POUR, NP  UNABLE TO FIND Med Name: Magnesium  Pure (powder) add to 12 oz of water three times daily (1/4 teaspoon)    [provider]  zinc  gluconate 50 MG tablet Take 50 mg by mouth daily.    [provider]    Family History Family History  Problem Relation Age of Onset   Skin cancer Mother    Pneumonia Mother        1/2 lung loss per [facility] New Patient Packet    Clotting disorder Father        PTE X 2   Skin cancer Father    Heart failure Father        Per  [facility] New Patient Packet    Allergies Brother    Asthma Brother  Heart attack Brother        > 55   Bladder Cancer Brother    Diabetes Daughter    Migraines Daughter    Heart attack Daughter    Breast cancer Daughter    Schizophrenia Son        Per [facility] New Patient Packet    Lung disease Son        Per [facility] New Patient Packet    Mental illness Son        suicide by hanging   Diabetes Son    Hepatitis C Son    Sudden death Other        2 M aunts & 2 M uncles in 90s   Stroke Neg Hx     Social History Social History   Tobacco Use   Smoking status: Never   Smokeless tobacco: Never  Vaping Use   Vaping status: Never Used  Substance Use Topics   Alcohol use: Not Currently    Alcohol/week: 1.0 standard drink of alcohol    Types: 1 Glasses of wine per week   Drug use: Never     Allergies   Penicillins and Sulfa  antibiotics   Review of Systems Review of Systems  Constitutional:  Negative for chills and fever.  HENT:  Negative for ear pain and sore throat.   Eyes:  Negative for pain and visual disturbance.  Respiratory:  Negative for cough and shortness of breath.   Cardiovascular:  Negative for chest pain and palpitations.  Gastrointestinal:  Negative for abdominal pain and vomiting.  Genitourinary:  Negative for dysuria and hematuria.  Musculoskeletal:  Negative for arthralgias and back pain.  Skin:  Positive for wound. Negative for color change and rash.  Neurological:  Negative for seizures and syncope.  All other systems reviewed and are negative.    Physical Exam Triage Vital Signs ED Triage Vitals [09/05/24 1721]  Encounter Vitals Group     BP 100/65     Girls Systolic BP Percentile      Girls Diastolic BP Percentile      Boys Systolic BP Percentile      Boys Diastolic BP Percentile      Pulse Rate (!) 101     Resp 18     Temp 97.9 F (36.6 C)     Temp Source Oral     SpO2 95 %     Weight      Height      Head Circumference      Peak Flow       Pain Score      Pain Loc      Pain Education      Exclude from Growth Chart    No data found.  Updated Vital Signs BP 100/65 (BP Location: Left Arm)   Pulse (!) 101   Temp 97.9 F (36.6 C) (Oral)   Resp 18   SpO2 95%   Visual Acuity Right Eye Distance:   Left Eye Distance:   Bilateral Distance:    Right Eye Near:   Left Eye Near:    Bilateral Near:     Physical Exam Vitals and nursing note reviewed.  Constitutional:      General: She is not in acute distress.    Appearance: She is well-developed.  HENT:     Head: Normocephalic and atraumatic.  Eyes:     Conjunctiva/sclera: Conjunctivae normal.  Cardiovascular:     Rate and Rhythm: Normal rate and regular rhythm.  Heart sounds: No murmur heard. Pulmonary:     Effort: Pulmonary effort is normal. No respiratory distress.     Breath sounds: Normal breath sounds.  Abdominal:     Palpations: Abdomen is soft.     Tenderness: There is no abdominal tenderness.  Musculoskeletal:        General: No swelling.     Cervical back: Neck supple.  Skin:    General: Skin is warm and dry.     Capillary Refill: Capillary refill takes less than 2 seconds.      Neurological:     Mental Status: She is alert.  Psychiatric:        Mood and Affect: Mood normal.      UC Treatments / Results  Labs (all labs ordered are listed, but only abnormal results are displayed) Labs Reviewed - No data to display  EKG   Radiology No results found.  Procedures Procedures (including critical care time)  Medications Ordered in UC Medications - No data to display  Initial Impression / Assessment and Plan / UC Course  I have reviewed the triage vital signs and the nursing notes.  Pertinent labs & imaging results that were available during my care of the patient were reviewed by me and considered in my medical decision making (see chart for details).     Wound is well appearing, no signs of infection.  Dressing change.  I did  show husband how to change the dressing as well.  They have a follow-up appointment with dermatology in 11 days. Final Clinical Impressions(s) / UC Diagnoses   Final diagnoses:  Visit for wound check     Discharge Instructions      Keep clean, change dressings. Keep follow up with dermatology   ED Prescriptions   None    PDMP not reviewed this encounter.   Ward, Harlene PEDLAR, PA-C 09/05/24 [phone]

## 2024-09-07 ENCOUNTER — Telehealth: Payer: Self-pay | Admitting: *Deleted

## 2024-09-07 ENCOUNTER — Telehealth: Payer: Self-pay

## 2024-09-07 NOTE — Progress Notes (Signed)
 Complex Care Management Note  Care Guide Note 09/07/2024 Name: Faith Edwards MRN: [id number] DOB: 1932-12-02  Faith Edwards is a 89 y.o. year old female who sees Caro, Harlene POUR, NP for primary care. I reached out to Apolinar JINNY Canon by phone today to offer complex care management services.  Ms. Gorton was given information about Complex Care Management services today including:   The Complex Care Management services include support from the care team which includes your Nurse Care Manager, Clinical Social Worker, or Pharmacist.  The Complex Care Management team is here to help remove barriers to the health concerns and goals most important to you. Complex Care Management services are voluntary, and the patient may decline or stop services at any time by request to their care team member.   Complex Care Management Consent Status: Patient agreed to services and verbal consent obtained.   Follow up plan:  Telephone appointment with complex care management team member scheduled for:  09/18/24 @ 9 AM  Encounter Outcome:  Patient Scheduled  Leotis Rase [facility], [facility] Guide  Direct Dial: [phone]  Fax [phone]

## 2024-09-07 NOTE — Telephone Encounter (Signed)
 Tried calling Husband, LMOM to return call.

## 2024-09-07 NOTE — Telephone Encounter (Signed)
 Copied from CRM [id number]. Topic: Clinical - Medical Advice >> Sep 07, 2024 10:20 AM Alfonso ORN wrote: Reason for CRM: spouse Jood Retana have a daughter who is a former Engineer, civil (consulting) , believed pt is giving too much  Magnesium  and that elderly should not be given Magnesium  ,patient is given  Magnesium   in a liquid form  Need advice

## 2024-09-07 NOTE — Telephone Encounter (Signed)
Okay to stop magnesium supplement

## 2024-09-08 NOTE — Telephone Encounter (Signed)
 Patient husband called the office back and I notified him of what PCP Caro, Harlene POUR, NP stated.

## 2024-09-08 NOTE — Telephone Encounter (Signed)
 Concerns already addressed earlier. See previous phone encounter.

## 2024-09-08 NOTE — Telephone Encounter (Unsigned)
 Copied from CRM [id number]. Topic: Clinical - Medical Advice >> Sep 07, 2024 10:20 AM Alfonso ORN wrote: Reason for CRM: spouse Nanea Jared have a daughter who is a former Engineer, civil (consulting) , believed pt is giving too much  Magnesium  and that elderly should not be given Magnesium  ,patient is given  Magnesium   in a liquid form  Need advice >> Sep 08, 2024  8:14 AM Miquel SAILOR wrote: Patient husband [name] returing Donzell call. Called and transferred

## 2024-09-10 DIAGNOSIS — H6123 Impacted cerumen, bilateral: Secondary | ICD-10-CM | POA: Diagnosis not present

## 2024-09-10 DIAGNOSIS — F02B0 Dementia in other diseases classified elsewhere, moderate, without behavioral disturbance, psychotic disturbance, mood disturbance, and anxiety: Secondary | ICD-10-CM | POA: Diagnosis not present

## 2024-09-16 ENCOUNTER — Telehealth: Payer: Self-pay

## 2024-09-16 NOTE — Telephone Encounter (Signed)
 Copied from CRM [id number]. Topic: Referral - Request for Referral >> Sep 15, 2024  2:57 PM Alfonso ORN wrote: Did the patient discuss referral with their provider in the last year? Yes (If No - schedule appointment) (If Yes - send message)  Appointment offered? Yes  Type of order/referral and detailed reason for visit: need a referral neurologist , Harlene An suggested sometime ago   Preference of office, provider, location: N/A  If referral order, have you been seen by this specialty before? No (If Yes, this issue or another issue? When? Where?  Can we respond through MyChart? No , perfer a phonecall

## 2024-09-16 NOTE — Telephone Encounter (Signed)
 Message routed to referral coordinator Carla.B. It looks like patient refused in the past. Does she need a new referral?

## 2024-09-17 ENCOUNTER — Other Ambulatory Visit: Payer: Self-pay | Admitting: Nurse Practitioner

## 2024-09-17 DIAGNOSIS — G301 Alzheimer's disease with late onset: Secondary | ICD-10-CM

## 2024-09-17 NOTE — Telephone Encounter (Signed)
 New referral placed.

## 2024-09-17 NOTE — Telephone Encounter (Signed)
 Clarified and understood. Patient husband is requesting neurology referral and wanting to know the update on it. Will another order be placed since patient deferred neurology referral on 09/04/24 ?

## 2024-09-17 NOTE — Telephone Encounter (Signed)
**Note De-identified  [place] Obfuscation** Please advise 

## 2024-09-17 NOTE — Telephone Encounter (Signed)
 Pt note husband would like to defer neurology referral at this time

## 2024-09-17 NOTE — Progress Notes (Unsigned)
 New referral placed.

## 2024-09-17 NOTE — Telephone Encounter (Signed)
 Attn: Dillard, Jasmine E, CMA    [name] is call on the status of the referral for pt Faith Edwards  Please advise

## 2024-09-17 NOTE — Telephone Encounter (Signed)
 Noted, thank you. Will inform patients provider regarding this information.

## 2024-09-17 NOTE — Telephone Encounter (Signed)
 Contacted patients spouse regarding referral order being places. No answer left patient a detailed voicemail. Also contacted Patients daugther unable to leave voicemail due to mailbox.

## 2024-09-17 NOTE — Telephone Encounter (Signed)
 Haven't received an update on referral. Routing to referral coordinator.

## 2024-09-18 ENCOUNTER — Other Ambulatory Visit: Payer: Self-pay | Admitting: Licensed Clinical Social Worker

## 2024-09-18 NOTE — Patient Instructions (Signed)
 Visit Information  Thank you for taking time to visit with me today. Please don't hesitate to contact me if I can be of assistance to you before our next scheduled appointment.  Our next appointment is by telephone on 10/17 at 9 AM Please call the care guide team at [phone] if you need to cancel or reschedule your appointment.   Following is a copy of your care plan:   Goals Addressed             This Visit's Progress    LCSW VBCI Social Work Care Plan   On track    Problems:   Care Coordination needs related to Dementia: Alzheimer's dementia  CSW Clinical Goal(s):   Over the next 90 days the Patient will attend all scheduled medical appointments as evidenced by patient report and care team review of appointment completion in electronic MEDICAL RECORD NUMBER  work with Child psychotherapist to address concerns related to strengthening support system in regards to ADLs (Activities of Daily Living.  Interventions:  Dementia Care:   Current level of care: Home with other family or significant other(s): spouse  Evaluation of patient safety in current living environment and review of Dementia resources and support (Patient is safe and doing well) ADL's Assessed needs, level of care concerns, how currently meeting needs and barriers to care Solution-Focused Strategies employed:Spouse has submitted an application for aid assistance through Medicare. It will take approx 6-7 weeks to process Facility  Patient and family are not interested in placement. Patient is active and has strong support Active listening / Reflection utilized Mining engineer reviewed Caregiver stress acknowledged :LCSW discussed self care strategies Consideration on in-home help encouraged : options discussed Discussed caregiver resources and support: Spouse receives support from family and friends Provided general psycho-education for mental health needs Quality of sleep assessed & Sleep Hygiene techniques  promoted Reviewed mental health medications and discussed importance of compliance: Pt is compliant with prescribed medications Suicidal Ideation/Homicidal Ideation assessed: Denies   Patient Goals/Self-Care Activities:  Continue taking your medication as prescribed.   Increase coping skills  Attend upcoming medical appts  Plan:   Telephone follow up appointment with care management team member scheduled for:  4 weeks        Please call the Suicide and Crisis Lifeline: 988 go to [facility] Urgent [facility] [address], [place] [phone]) call 911 if you are experiencing a Mental Health or Behavioral Health Crisis or need someone to talk to.  The patient verbalized understanding of instructions, educational materials, and care plan provided today and agreed to receive a mailed copy of patient instructions, educational materials, and care plan.   Rolin Ezzard HUGHS [facility]  [facility], [facility] Clinical Social Worker Direct Dial: [phone]  Fax: [phone] Website: [email or web address] 10:11 AM

## 2024-09-18 NOTE — Patient Outreach (Signed)
 Complex Care Management   Visit Note  09/18/2024  Name:  Faith Edwards MRN: [id number] DOB: 11-23-1932  Situation: Referral received for Complex Care Management related to Dementia I obtained verbal consent from Caregiver Patient.  Visit completed with Caregiver Patient  on the phone  Background:   Past Medical History:  Diagnosis Date   Anxiety    Arthritis    Asthma    Bronchitis    hx of    Chronic headaches    [facility]   COVID-19    Cystitis    1954   Depression    Ear pain    bilat    Hiatal hernia    History of blood clots    Lungs, per [facility] new patient packet    History of blood transfusion    2010   Hx of blood clots 2007 & 2010   post TKR   Hyperthyroidism    Pelvic fracture (HCC)    hx of in three places secondary to fall    Pneumonia    hx of 1952   Pulmonary embolism (HCC) 1993   on HRT   Right wrist fracture    hx of 1995 has plate secondary to fall    Shaking    hands bilat   Shingles     per [facility] new patient packet    Shortness of breath dyspnea    Skin cancer    basal cell    Spinal stenosis    Trauma    left lower leg 10/22/2014    Urinary frequency    Urticaria     Assessment: Patient Reported Symptoms:  Cognitive Cognitive Status: Alert and oriented to person, place, and time, Normal speech and language skills   Health Maintenance Behaviors: Annual physical exam  Neurological Neurological Review of Symptoms: No symptoms reported Neurological Comment: Pt has been referred to Neurology  HEENT HEENT Symptoms Reported: No symptoms reported      Cardiovascular      Respiratory Respiratory Symptoms Reported: No symptoms reported    Endocrine Endocrine Symptoms Reported: No symptoms reported Is patient diabetic?: No Endocrine Comment: Has Grave's Disease  Gastrointestinal Gastrointestinal Symptoms Reported: No symptoms reported      Genitourinary Genitourinary Symptoms Reported: No symptoms reported Genitourinary  Management Strategies: Coping strategies Genitourinary Comment: Has CKD stage 3  Integumentary Integumentary Symptoms Reported: No symptoms reported    Musculoskeletal Musculoskelatal Symptoms Reviewed: No symptoms reported Musculoskeletal Management Strategies: Exercise, Routine screening Musculoskeletal Comment: 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 Falls in the past year?: No Number of falls in past year: 1 or less Was there an injury with Fall?: No Fall Risk Category Calculator: 0 Patient Fall Risk Level: Low Fall Risk Patient at Risk for Falls Due to: No Fall Risks  Psychosocial Psychosocial Symptoms Reported: Alteration in sleep habits Additional Psychological Details: Crying spells Behavioral Management Strategies: Activity, Support system, Exercise, Coping strategies, Medication therapy Major Change/Loss/Stressor/Fears (CP): Medical condition, self Techniques to Cope with Loss/Stress/Change: Diversional activities, Exercise, Medication, Spiritual practice(s) Quality of Family Relationships: helpful, involved, supportive Do you feel physically threatened by others?: No    09/18/2024    PHQ2-9 Depression Screening   Little interest or pleasure in doing things    Feeling down, depressed, or hopeless    PHQ-2 - Total Score    Trouble falling or staying asleep, or sleeping too much    Feeling tired or having little energy    Poor appetite or overeating     Feeling bad about  yourself - or that you are a failure or have let yourself or your family down    Trouble concentrating on things, such as reading the newspaper or watching television    Moving or speaking so slowly that other people could have noticed.  Or the opposite - being so fidgety or restless that you have been moving around a lot more than usual    Thoughts that you would be better off dead, or hurting yourself in some way    PHQ2-9 Total Score    If you checked off any problems, how difficult have these problems made it for  you to do your work, take care of things at home, or get along with other people    Depression Interventions/Treatment      There were no vitals filed for this visit.  Medications Reviewed Today     Reviewed by Ezzard Rolin BIRCH, LCSW (Social Worker) on 09/18/24 at 0910  Med List Status: <None>   Medication Order Taking? Sig Documenting Provider Last Dose Status Informant  atorvastatin  (LIPITOR) 20 MG tablet [id number] Yes Take 1 tablet (20 mg total) by mouth daily. Caro Harlene POUR, NP  Active   calcium  carbonate (CALCIUM  600) 600 MG TABS tablet [id number] Yes Take 1 tablet (600 mg total) by mouth 2 (two) times daily with a meal. Caro Harlene POUR, NP  Active   cholecalciferol (VITAMIN D ) 1000 UNITS tablet [id number] Yes Take 1,000 Units by mouth daily. [provider]  Active Self  Cyanocobalamin  (B-12) 1000 MCG CHEW [id number] Yes Chew 2,000 mcg by mouth daily. [provider]  Active   Cyanocobalamin  (B-12) 3000 MCG SUBL [id number]  Place 1 tablet under the tongue daily.  Patient not taking: Reported on 09/18/2024   [provider]  Active   denosumab  (PROLIA ) 60 MG/ML SOSY injection [id number] Yes Inject 60 mg into the skin every 6 (six) months. [provider]  Active   denosumab  (PROLIA ) injection 60 mg [id number]   Eubanks, [name] K, NP  Active   esomeprazole  (NEXIUM ) 40 MG capsule [id number] Yes Take 1 capsule (40 mg total) by mouth daily. Cleotilde Garnette HERO, MD  Active   hydrocortisone  (ANUSOL -[facility]) 2.5 % rectal cream [id number]  Place 1 Application rectally 2 (two) times daily as needed for hemorrhoids or anal itching.  Patient not taking: Reported on 09/18/2024   Avram [name] BRAVO, MD  Active   Hyoscyamine  Sulfate SL (LEVSIN AMIEL) 0.125 MG SUBL [id number]  Place 1 tablet (0.125 mg total) under the tongue 2 (two) times daily as needed.  Patient not taking: Reported on 09/18/2024   Cleotilde Garnette HERO, MD  Active   melatonin 1 MG TABS tablet [id number] Yes Take 1  tablet (1 mg total) by mouth at bedtime. Caro Harlene POUR, NP  Active   sertraline  (ZOLOFT ) 25 MG tablet [id number] Yes Take 1 tablet (25 mg total) by mouth daily. Caro Harlene POUR, NP  Active   UNABLE TO FIND [id number]  Med Name: Magnesium  Pure (powder) add to 12 oz of water three times daily (1/4 teaspoon) [provider]  Active   zinc  gluconate 50 MG tablet [id number] Yes Take 50 mg by mouth daily. [provider]  Active             Recommendation:   Continue Current Plan of Care  Follow Up Plan:   Telephone follow-up in 1 month  Rolin Ezzard, LCSW [facility]  [facility], [facility] Clinical Social Worker Direct Dial: [phone]  Fax: [phone] Website: [email or web address] 10:11 AM

## 2024-09-21 ENCOUNTER — Telehealth: Payer: Self-pay | Admitting: Nurse Practitioner

## 2024-09-21 ENCOUNTER — Ambulatory Visit: Payer: Self-pay

## 2024-09-21 NOTE — Telephone Encounter (Signed)
 Darral Rocky BROCKS, RN to [facility]  (Selected Message)     09/21/24  3:41 PM FYI- see triage notes, question for provider

## 2024-09-21 NOTE — Telephone Encounter (Signed)
 Copied from CRM [id number]. Topic: Referral - Question >> Sep 21, 2024 12:07 PM Mercer PEDLAR wrote: Reason for CRM: HUSBAND [name] calling regarding Neurology referral which was discussed with [name]. He stated that thwy would like it to be sent to [facility].

## 2024-09-21 NOTE — Telephone Encounter (Signed)
 FYI Only or Action Required?: Action required by provider: clinical question for provider and update on patient condition.  Patient was last seen in primary care on 09/04/2024 by Caro Harlene POUR, NP.  Called Nurse Triage reporting Insomnia.  Symptoms began about a month ago.  Interventions attempted: Prescription medications: melatonin.  Symptoms are: unchanged.  Triage Disposition: Call PCP When Office is Open (overriding See PCP Within 2 Weeks)  Patient/caregiver understands and will follow disposition?: Yes                             Copied from CRM [id number]. Topic: Clinical - Medical Advice >> Sep 21, 2024  2:58 PM Graeme ORN wrote: Reason for CRM: Patient husband called - states patient having trouble sleeping. Went from 10mg  to 1mg  melatonin but sometimes has to take 4 or 5 1mg  and still having trouble sleeping. Wants to know what provider suggests Reason for Disposition  [1] Insomnia lasts > 2 weeks AND [2] no improvement after using Care Advice  Answer Assessment - Initial Assessment Questions 1. DESCRIPTION: Tell me about your sleeping problem. (e.g., waking frequently during night, sleeping during day and awake at night, trouble falling asleep) How bad is it?      Difficulty staying asleep 2. ONSET: How long have you been having trouble sleeping? (e.g., days, weeks, months; longstanding sleep problems)     4 weeks  5. PAIN: Do you have any pain that is keeping you awake? (e.g., back pain, joint pain) If Yes, ask How bad is the pain? (e.g., scale 0-10; mild, moderate, severe).     Denies 8. MEDICINE CHANGE: Has there been any recent change in medicines? (e.g., new medicine started, stopped, or dose changed).      Switched to 1 MG melatonin 3-4 weeks ago from 10 MG 9. TREATMENT: What have you done so far to treat this sleep problem? (e.g., prescription or OTC sleep medicines, herbal or dietary supplements, cannabis, relaxation  strategies)     Melatonin  10. OTHER SYMPTOMS: Do you have any other symptoms?  (e.g., difficulty breathing)     Anxiety/crying about not being able to fall back asleep    Patient is currently prescribed melatonin 1 MG. Spouse reported he is having to give patient 3-4 MG for patient to only sleep a couple of hours. Spouse is calling to report to provider that this medication has not been effective. Spouse would like provider's recommendation or alternative medication to help keep patient asleep for longer. Please advise.  Protocols used: Insomnia-A-AH

## 2024-09-21 NOTE — Telephone Encounter (Signed)
 Message routed to PCP Caro, Harlene POUR, NP

## 2024-09-22 ENCOUNTER — Encounter: Payer: Self-pay | Admitting: Physician Assistant

## 2024-09-22 ENCOUNTER — Telehealth: Payer: Self-pay | Admitting: Nurse Practitioner

## 2024-09-22 NOTE — Telephone Encounter (Signed)
 I called patient husband [name] and no answer. I will make additional attempt later.

## 2024-09-22 NOTE — Telephone Encounter (Signed)
 Has she started the zoloft ? If her anxiety is better control this can help with better sleep.

## 2024-09-22 NOTE — Telephone Encounter (Signed)
 Copied from CRM [id number]. Topic: Referral - Question >> Sep 21, 2024 11:40 AM Susanna ORN wrote: Bari, with [facility], called stating that the patient was referred to a neurologist by the name of Dr. Bluford Crochet. Bari states the number listed on the referral is to the hospital and that provider is not there. When she looked it up, it shows that he's in [place] and the patient lives in [place]. They are trying to get the correct information for this provider. Please give the patient a call with the correct information.

## 2024-09-22 NOTE — Telephone Encounter (Signed)
 Copied from CRM [id number]. Topic: Referral - Question >> Sep 21, 2024 10:28 AM Zane F wrote: Reason for CRM:   Patient's husband, [name], called in to confirm the contact information for the patient's recent neurology referral. Specialist provided the patient's husband with the following information from Referral : [id number].   [facility] Neurology at [facility]  [address] [place], [place], [place] [address]  [phone]   Callback Number: [id number] >> Sep 21, 2024 11:40 AM Susanna ORN wrote: Bari, with [facility], called stating that the patient was referred to a neurologist by the name of Dr. Bluford Crochet. Bari states the number listed on the referral is to the hospital and that provider is not there. When she looked it up, it shows that he's in [place] and the patient lives in [place]. They are trying to get the correct information for this provider. Please give the patient a call with the correct information.

## 2024-09-24 ENCOUNTER — Other Ambulatory Visit: Payer: Self-pay | Admitting: Nurse Practitioner

## 2024-09-24 ENCOUNTER — Telehealth: Payer: Self-pay

## 2024-09-24 DIAGNOSIS — G4700 Insomnia, unspecified: Secondary | ICD-10-CM

## 2024-09-24 MED ORDER — TRAZODONE HCL 50 MG PO TABS: 25.0000 mg | ORAL_TABLET | Freq: Every day | ORAL | 1 refills | Status: DC

## 2024-09-24 NOTE — Telephone Encounter (Signed)
 Trazodone  50 mg tablet sent to pharmacy- start with half tablet at bedtime for 2 weeks then increase to 1 tablet.

## 2024-09-24 NOTE — Telephone Encounter (Signed)
 Spoke with [name] and he would like recommendations to assist patient with sleep disturbance. Melatonin is not working. [name] would like a prescribed medication or over the counter, please advise?

## 2024-09-24 NOTE — Telephone Encounter (Signed)
 Left a detailed message with providers response and urged [name] to call if he has any questions or concerns

## 2024-09-24 NOTE — Telephone Encounter (Signed)
 Copied from CRM [id number]. Topic: Clinical - Medical Advice >> Sep 21, 2024  2:58 PM Graeme ORN wrote: Reason for CRM: Patient husband called - states patient having trouble sleeping. Went from 10mg  to 1mg  melatonin but sometimes has to take 4 or 5 1mg  and still having trouble sleeping. Wants to know what provider suggests >> Sep 23, 2024  4:06 PM Miquel SAILOR wrote: Patient husband [name] returning call  from 09/23. Called office instructed to let PT know they are in a meeting. Needs call back [phone] husband requested call back ASAP today and ok to leave detail message  >> Sep 22, 2024  4:21 PM Mercer PEDLAR wrote: [name], patient's husband, returning call. Attempted to transfer to clinic, Alondra stated that Rolin will call patient back before end of day today 09/22/24.

## 2024-09-24 NOTE — Telephone Encounter (Signed)
 Left message on voicemail for patient to return call when available

## 2024-09-30 DIAGNOSIS — F02B0 Dementia in other diseases classified elsewhere, moderate, without behavioral disturbance, psychotic disturbance, mood disturbance, and anxiety: Secondary | ICD-10-CM | POA: Diagnosis not present

## 2024-10-16 ENCOUNTER — Other Ambulatory Visit: Payer: Self-pay | Admitting: Licensed Clinical Social Worker

## 2024-10-19 ENCOUNTER — Telehealth: Payer: Self-pay | Admitting: *Deleted

## 2024-10-19 NOTE — Telephone Encounter (Signed)
 Received mail from [facility] Program Alignment Notification and Medication Reconciliation Request.   Per Harlene -Print off our Medication list and fax to them. Fax: [phone].   Printed and Faxed as requested.

## 2024-10-21 NOTE — Patient Instructions (Signed)
 Visit Information  Thank you for taking time to visit with me today. Please don't hesitate to contact me if I can be of assistance to you before our next scheduled appointment.  Your next care management appointment is by telephone on 11/19 at 10 AM  Please call the care guide team at [phone] if you need to cancel, schedule, or reschedule an appointment.   Please call the Suicide and Crisis Lifeline: 988 go to [facility] Urgent [facility] [address] [place] [phone]) call 911 if you are experiencing a Mental Health or Behavioral Health Crisis or need someone to talk to.  Rolin Kerns, LCSW [place]  [facility], [facility] Clinical Social Worker Direct Dial: [phone]  Fax: [phone] Website: [email or web address] 5:03 AM

## 2024-10-21 NOTE — Patient Outreach (Signed)
 Complex Care Management   Visit Note  10/16/2024  Name:  Faith Edwards MRN: [id number] DOB: April 21, 1932  Situation: Referral received for Complex Care Management related to Dementia I obtained verbal consent from Caregiver.  Visit completed with Caregiver  on the phone  Background:   Past Medical History:  Diagnosis Date   Anxiety    Arthritis    Asthma    Bronchitis    hx of    Chronic headaches    [facility]   COVID-19    Cystitis    1954   Depression    Ear pain    bilat    Hiatal hernia    History of blood clots    Lungs, per [facility] new patient packet    History of blood transfusion    2010   Hx of blood clots 2007 & 2010   post TKR   Hyperthyroidism    Pelvic fracture (HCC)    hx of in three places secondary to fall    Pneumonia    hx of 1952   Pulmonary embolism (HCC) 1993   on HRT   Right wrist fracture    hx of 1995 has plate secondary to fall    Shaking    hands bilat   Shingles     per [facility] new patient packet    Shortness of breath dyspnea    Skin cancer    basal cell    Spinal stenosis    Trauma    left lower leg 10/22/2014    Urinary frequency    Urticaria     Assessment: Patient Reported Symptoms:  Cognitive Cognitive Status: Normal speech and language skills Cognitive/Intellectual Conditions Management [RPT]: Other Other: Dementia   Health Maintenance Behaviors: Annual physical exam  Neurological Neurological Review of Symptoms: No symptoms reported Neurological Management Strategies: Coping strategies, Routine screening Neurological Comment: Caregiver would like pt to be followed by a MD at Neurology. Will ask PCP for additional resource information  HEENT HEENT Symptoms Reported: Not assessed      Cardiovascular Cardiovascular Symptoms Reported: Not assessed    Respiratory Respiratory Symptoms Reported: Not assesed    Endocrine Endocrine Symptoms Reported: Not assessed    Gastrointestinal Gastrointestinal Symptoms Reported:  Not assessed      Genitourinary Genitourinary Symptoms Reported: Not assessed    Integumentary Integumentary Symptoms Reported: Not assessed    Musculoskeletal Musculoskelatal Symptoms Reviewed: Not assessed        Psychosocial Psychosocial Symptoms Reported: Anxiety - if selected complete GAD Additional Psychological Details: Per caregiver, pt continues to state I don't want to be alone Behavioral Management Strategies: Activity, Support system, Adequate rest, Exercise, Medication therapy Behavioral Health Comment: Caregiver is not interested in medication adjustments at this time. Will discuss with PCP on 10/24 Major Change/Loss/Stressor/Fears (CP): Medical condition, self      10/21/2024    PHQ2-9 Depression Screening   Little interest or pleasure in doing things    Feeling down, depressed, or hopeless    PHQ-2 - Total Score    Trouble falling or staying asleep, or sleeping too much    Feeling tired or having little energy    Poor appetite or overeating     Feeling bad about yourself - or that you are a failure or have let yourself or your family down    Trouble concentrating on things, such as reading the newspaper or watching television    Moving or speaking so slowly that other people could have noticed.  Or the opposite - being so fidgety or restless that you have been moving around a lot more than usual    Thoughts that you would be better off dead, or hurting yourself in some way    PHQ2-9 Total Score    If you checked off any problems, how difficult have these problems made it for you to do your work, take care of things at home, or get along with other people    Depression Interventions/Treatment      There were no vitals filed for this visit.  Medications Reviewed Today     Reviewed by Yolanda Huffstetler D, LCSW (Social Worker) on 10/21/24 at 0448  Med List Status: <None>   Medication Order Taking? Sig Documenting Provider Last Dose Status Informant  atorvastatin   (LIPITOR) 20 MG tablet [id number]  Take 1 tablet (20 mg total) by mouth daily. Caro Harlene POUR, NP  Active   calcium  carbonate (CALCIUM  600) 600 MG TABS tablet [id number]  Take 1 tablet (600 mg total) by mouth 2 (two) times daily with a meal. Caro Harlene POUR, NP  Active   cholecalciferol (VITAMIN D ) 1000 UNITS tablet [id number]  Take 1,000 Units by mouth daily. [provider]  Active Self  Cyanocobalamin  (B-12) 1000 MCG CHEW [id number]  Chew 2,000 mcg by mouth daily. [provider]  Active   denosumab  (PROLIA ) 60 MG/ML SOSY injection [id number]  Inject 60 mg into the skin every 6 (six) months. [provider]  Active   denosumab  (PROLIA ) injection 60 mg [id number]   Eubanks, [name] K, NP  Active   esomeprazole  (NEXIUM ) 40 MG capsule [id number]  Take 1 capsule (40 mg total) by mouth daily. Cleotilde Garnette HERO, MD  Active   melatonin 1 MG TABS tablet [id number]  Take 1 tablet (1 mg total) by mouth at bedtime. Caro Harlene POUR, NP  Active   sertraline  (ZOLOFT ) 25 MG tablet [id number]  Take 1 tablet (25 mg total) by mouth daily. Caro Harlene POUR, NP  Active   traZODone  (DESYREL ) 50 MG tablet [id number]  Take 0.5-1 tablets (25-50 mg total) by mouth at bedtime. Caro Harlene POUR, NP  Active   UNABLE TO FIND [id number]  Med Name: Magnesium  Pure (powder) add to 12 oz of water three times daily (1/4 teaspoon) [provider]  Active   zinc  gluconate 50 MG tablet [id number]  Take 50 mg by mouth daily. [provider]  Active             Recommendation:   Continue Current Plan of Care  Follow Up Plan:   Telephone follow-up in 1 month  Rolin Kerns, LCSW [facility]  [facility], [facility] Clinical Social Worker Direct Dial: [phone]  Fax: [phone] Website: [email or web address] 5:00 AM

## 2024-10-21 NOTE — Progress Notes (Signed)
 She has not even had the appt yet. He can call the office and ask to see another provider if he does not wish to see her or has a preference.

## 2024-10-23 ENCOUNTER — Encounter: Payer: Self-pay | Admitting: Nurse Practitioner

## 2024-10-23 ENCOUNTER — Ambulatory Visit: Admitting: Nurse Practitioner

## 2024-10-23 VITALS — BP 108/70 | HR 60 | Temp 97.6°F | Resp 17 | Ht 65.0 in | Wt 144.4 lb

## 2024-10-23 DIAGNOSIS — G301 Alzheimer's disease with late onset: Secondary | ICD-10-CM

## 2024-10-23 DIAGNOSIS — L602 Onychogryphosis: Secondary | ICD-10-CM | POA: Diagnosis not present

## 2024-10-23 DIAGNOSIS — G4700 Insomnia, unspecified: Secondary | ICD-10-CM

## 2024-10-23 DIAGNOSIS — F02B4 Dementia in other diseases classified elsewhere, moderate, with anxiety: Secondary | ICD-10-CM

## 2024-10-23 DIAGNOSIS — J309 Allergic rhinitis, unspecified: Secondary | ICD-10-CM | POA: Diagnosis not present

## 2024-10-23 DIAGNOSIS — E7849 Other hyperlipidemia: Secondary | ICD-10-CM

## 2024-10-23 DIAGNOSIS — F419 Anxiety disorder, unspecified: Secondary | ICD-10-CM | POA: Diagnosis not present

## 2024-10-23 LAB — BASIC METABOLIC PANEL WITH GFR
BUN: 20 mg/dL (ref 7–25)
CO2: 25 mmol/L (ref 20–32)
Calcium: 9.5 mg/dL (ref 8.6–10.4)
Chloride: 103 mmol/L (ref 98–110)
Creat: 0.94 mg/dL (ref 0.60–0.95)
Glucose, Bld: 105 mg/dL — ABNORMAL HIGH (ref 65–99)
Potassium: 4.9 mmol/L (ref 3.5–5.3)
Sodium: 136 mmol/L (ref 135–146)
eGFR: 58 mL/min/1.73m2 — ABNORMAL LOW (ref >=60)

## 2024-10-23 NOTE — Progress Notes (Signed)
 Careteam: Patient Care Team: Caro Harlene POUR, NP as PCP - General (Geriatric Medicine) Darlean Ozell NOVAK, MD as Consulting Physician (Pulmonary Disease) Cleotilde Sewer, OD (Optometry) Tasia Lung, MD as Consulting Physician (Psychiatry) Trixie File, MD as Consulting Physician (Internal Medicine) Sissy Sieving, MD as Referring Physician (Orthopedic Surgery) Fleeta Smock, Lamar BROCKS, MD as Consulting Physician (Allergy and Immunology) Aneita Gwendlyn DASEN, MD (Inactive) as Consulting Physician (Gastroenterology) Arnaldo Juliene RAMAN, MD as Attending Physician (Family Medicine) Carlie Clark, MD as Consulting Physician (Otolaryngology) Ezzard Rolin BIRCH, LCSW as [facility] Care Management (Licensed Clinical Social Worker)  PLACE OF SERVICE:  [facility]  Advanced Directive information Does Patient Have a Medical Advance Directive?: No, Would patient like information on creating a medical advance directive?: No - Patient declined  Allergies  Allergen Reactions   Penicillins Hives   Sulfa  Antibiotics Hives and Nausea And Vomiting    7-15    Chief Complaint  Patient presents with   Follow-up    4 Week follow up for mood & labs.Discuss the need for flu & covid vaccines,and dexa scan.    HPI:  Discussed the use of AI scribe software for clinical note transcription with the patient, who gave verbal consent to proceed.  History of Present Illness Faith Edwards is a 89 year old female who presents for a four-week follow-up for memory loss.  She has been experiencing progressive memory loss, which is a concern for her husband.   She is currently taking atorvastatin  20 mg, confirmed by her husband   She is also on trazodone  50 mg for sleep, which she started at half a dose for two weeks before increasing to a full dose. It helps her fall asleep but not stay asleep, as she wakes up multiple times a night to use the bathroom and struggles to return to sleep.   She is taking sertraline  for  depression and anxiety. Her caregiver reports frequent crying, especially when agitated, but she can stop herself when she decides to.   She uses over-the-counter Tylenol  for back pain, attributed to poor posture. Her daughter, a engineer, civil (consulting), observed that she is stooping over.  She has a toe problem that started about five days ago, with pain on the inside of the toe. The toe is not swollen, hot, or red, but there is a fungal issue with the toenail, which may be causing discomfort.  She has been coughing heavily at night, which her husband attributes to allergies, as she is allergic to grass and other unspecified allergens.   Review of Systems:  Review of Systems  Constitutional:  Positive for weight loss. Negative for chills and fever.  HENT:  Negative for tinnitus.   Respiratory:  Positive for cough. Negative for sputum production and shortness of breath.   Cardiovascular:  Negative for chest pain, palpitations and leg swelling.  Gastrointestinal:  Negative for abdominal pain, constipation, diarrhea and heartburn.  Genitourinary:  Negative for dysuria, frequency and urgency.  Musculoskeletal:  Negative for back pain, falls, joint pain and myalgias.  Skin: Negative.   Neurological:  Negative for dizziness and headaches.  Psychiatric/Behavioral:  Positive for depression and memory loss. The patient does not have insomnia.     Past Medical History:  Diagnosis Date   Anxiety    Arthritis    Asthma    Bronchitis    hx of    Chronic headaches    [facility]   COVID-19    Cystitis    1954  Depression    Ear pain    bilat    Hiatal hernia    History of blood clots    Lungs, per [facility] new patient packet    History of blood transfusion    2010   Hx of blood clots 2007 & 2010   post TKR   Hyperthyroidism    Pelvic fracture (HCC)    hx of in three places secondary to fall    Pneumonia    hx of 1952   Pulmonary embolism (HCC) 1993   on HRT   Right wrist fracture    hx of 1995  has plate secondary to fall    Shaking    hands bilat   Shingles     per [facility] new patient packet    Shortness of breath dyspnea    Skin cancer    basal cell    Spinal stenosis    Trauma    left lower leg 10/22/2014    Urinary frequency    Urticaria    Past Surgical History:  Procedure Laterality Date   ABDOMINAL HYSTERECTOMY  1969    per [facility] new patient packet    ADENOIDECTOMY     BREAST ENHANCEMENT SURGERY  1985   Cancer removal Right    Right leg (Info given by patient)   CHOLECYSTECTOMY  1992    per [facility] new patient packet    COLONOSCOPY     diverticulosis   ESI  11/2013    C7-T1 ; Dr Bonner   EYE SURGERY     bilat cataract surgery    INSERTION OF MESH N/A 07/08/2015   Procedure: INSERTION OF MESH;  Surgeon: Krystal Spinner, MD;  Location: [facility] ORS;  Service: General;  Laterality: N/A;   REPLACEMENT TOTAL KNEE BILATERAL  2007 & 2010   SP FACET INJECTION  01/19/2014   RC2-3,3-4,C4-5   TONSILLECTOMY  1945    per [facility] new patient packet    VENTRAL HERNIA REPAIR N/A 07/08/2015   Procedure: VENTRAL ADULT REPAIR VENTRAL INCISIONAL HERNIA REPAIR;  Surgeon: Krystal Spinner, MD;  Location: [facility] ORS;  Service: General;  Laterality: N/A;   WRIST FRACTURE SURGERY Bilateral 10  years and 2 years/   WRIST SURGERY Left 06/2016   plate    Social History:   reports that she has never smoked. She has never used smokeless tobacco. She reports that she does not currently use alcohol after a past usage of about 1.0 standard drink of alcohol per week. She reports that she does not use drugs.  Family History  Problem Relation Age of Onset   Skin cancer Mother    Pneumonia Mother        1/2 lung loss per [facility] New Patient Packet    Clotting disorder Father        PTE X 2   Skin cancer Father    Heart failure Father        Per [facility] New Patient Packet    Allergies Brother    Asthma Brother    Heart attack Brother        > 55   Bladder Cancer Brother    Diabetes Daughter    Migraines Daughter     Heart attack Daughter    Breast cancer Daughter    Schizophrenia Son        Per [facility] New Patient Packet    Lung disease Son        Per [facility] New Patient Packet  Mental illness Son        suicide by hanging   Diabetes Son    Hepatitis C Son    Sudden death Other        2 M aunts & 2 M uncles in 28s   Stroke Neg Hx     Medications: Patient's Medications  New Prescriptions   No medications on file  Previous Medications   ATORVASTATIN  (LIPITOR) 20 MG TABLET    Take 1 tablet (20 mg total) by mouth daily.   CALCIUM  CARBONATE (CALCIUM  600) 600 MG TABS TABLET    Take 1 tablet (600 mg total) by mouth 2 (two) times daily with a meal.   CHOLECALCIFEROL (VITAMIN D ) 1000 UNITS TABLET    Take 1,000 Units by mouth daily.   CYANOCOBALAMIN  (B-12) 1000 MCG CHEW    Chew 2,000 mcg by mouth daily.   DENOSUMAB  (PROLIA ) 60 MG/ML SOSY INJECTION    Inject 60 mg into the skin every 6 (six) months.   ESOMEPRAZOLE  (NEXIUM ) 40 MG CAPSULE    Take 1 capsule (40 mg total) by mouth daily.   MELATONIN 1 MG TABS TABLET    Take 1 tablet (1 mg total) by mouth at bedtime.   SERTRALINE  (ZOLOFT ) 25 MG TABLET    Take 1 tablet (25 mg total) by mouth daily.   TRAZODONE  (DESYREL ) 50 MG TABLET    Take 0.5-1 tablets (25-50 mg total) by mouth at bedtime.   UNABLE TO FIND    Med Name: Magnesium  Pure (powder) add to 12 oz of water three times daily (1/4 teaspoon)   ZINC  GLUCONATE 50 MG TABLET    Take 50 mg by mouth daily.  Modified Medications   No medications on file  Discontinued Medications   No medications on file    Physical Exam:  Vitals:   10/23/24 1137  BP: 108/70  Pulse: 60  Resp: 17  Temp: 97.6 F (36.4 C)  SpO2: 98%  Weight: 144 lb 6.4 oz (65.5 kg)  Height: 5' 5 (1.651 m)   Body mass index is 24.03 kg/m. Wt Readings from Last 3 Encounters:  10/23/24 144 lb 6.4 oz (65.5 kg)  09/03/24 148 lb 9.6 oz (67.4 kg)  08/25/24 149 lb 12.8 oz (67.9 kg)    Physical Exam Constitutional:      General: She is  not in acute distress.    Appearance: She is well-developed. She is not diaphoretic.  HENT:     Head: Normocephalic and atraumatic.     Mouth/Throat:     Pharynx: No oropharyngeal exudate.  Eyes:     Conjunctiva/sclera: Conjunctivae normal.     Pupils: Pupils are equal, round, and reactive to light.  Cardiovascular:     Rate and Rhythm: Normal rate and regular rhythm.     Heart sounds: Normal heart sounds.  Pulmonary:     Effort: Pulmonary effort is normal.     Breath sounds: Normal breath sounds.  Abdominal:     General: Bowel sounds are normal.     Palpations: Abdomen is soft.  Musculoskeletal:     Cervical back: Normal range of motion and neck supple.     Right lower leg: No edema.     Left lower leg: No edema.  Feet:     Right foot:     Toenail Condition: Right toenails are abnormally thick. Fungal disease present. Skin:    General: Skin is warm and dry.  Neurological:     Mental Status: She is alert.  Psychiatric:        Mood and Affect: Mood normal.     Labs reviewed: Basic Metabolic Panel: Recent Labs    02/07/24 0949 02/21/24 1517 08/10/24 1004  NA 139  --  137  K 4.6  --  4.8  CL 104  --  104  CO2 26  --  23  GLUCOSE 111*  --  101*  BUN 21  --  25  CREATININE 1.08*  --  1.05*  CALCIUM  9.9  --  9.4  TSH  --  3.72  --    Liver Function Tests: Recent Labs    02/07/24 0949 08/10/24 1004  AST 19 25  ALT 19 23  BILITOT 0.8 1.1  PROT 6.9 6.5   No results for input(s): LIPASE, AMYLASE in the last 8760 hours. No results for input(s): AMMONIA in the last 8760 hours. CBC: Recent Labs    02/21/24 1517 08/10/24 1004  WBC 6.9 8.2  NEUTROABS 3,664 5,797  HGB 13.2 12.8  HCT 39.9 39.2  MCV 96.6 99.5  PLT 290 269   Lipid Panel: Recent Labs    08/10/24 1004  CHOL 152  HDL 75  LDLCALC 61  TRIG 78  CHOLHDL 2.0   TSH: Recent Labs    02/21/24 1517  TSH 3.72   A1C: Lab Results  Component Value Date   HGBA1C 5.6 01/05/2021      Assessment/Plan  Assessment & Plan Dementia with memory loss Progressive decline noted. Education provided Husband reports she is overall eating well but some decrease in appetite and weight loss noted. To liberalize diet if appetite continues to decrease discussed changing medication.  - Attend neurology appointment on December 2. - Discuss role of medication in slowing decline.  Insomnia She is going to bed at 8:30 and getting up several  hours after to go to the bathroom and not able to return to sleep  - Adjust bedtime to 10 PM to improve sleep quality. - Discuss sleep hygiene.  Anxiety Mood  partially controlled with sertraline . Reports crying episodes, particularly when agitated but husband able to talk to her and redirect.  - Continue sertraline  at current dose as she is also on trazodone  to help sleep  Onychomycosis with painful toenail Onychomycosis with painful toenail likely due to overgrown and fungal toenail causing pressure and discomfort. - Refer to podiatrist for toenail trimming and management.  Hyperlipidemia Hyperlipidemia managed with atorvastatin  20 mg.   Allergic rhinitis Allergic rhinitis with frequent coughing likely related to allergies. To take Loratadine or cetrizine (generic for Claritin or zyrtec) 10 mg by mouth daily for allergies.    Return in about 3 months (around 01/23/2025) for routine follow up.  Jacquita Mulhearn K. Caro BODILY [facility] [phone]

## 2024-10-23 NOTE — Patient Instructions (Signed)
 Continue trazodone  50 mg by mouth daily at bedtime for sleep Push bedtime to a later time  Continue zoloft  25 mg daily for anxiety and depression- can increase this if needed  For back discomfort consider home health physical therapy- we can order if you need  Keep appt with neurology

## 2024-10-24 ENCOUNTER — Ambulatory Visit: Payer: Self-pay | Admitting: Nurse Practitioner

## 2024-10-28 ENCOUNTER — Other Ambulatory Visit: Payer: Self-pay | Admitting: Nurse Practitioner

## 2024-10-28 DIAGNOSIS — F419 Anxiety disorder, unspecified: Secondary | ICD-10-CM

## 2024-10-28 NOTE — Telephone Encounter (Signed)
 Red high dose warning regarding medication refill.

## 2024-10-30 ENCOUNTER — Encounter: Payer: Self-pay | Admitting: Nurse Practitioner

## 2024-10-30 ENCOUNTER — Ambulatory Visit (INDEPENDENT_AMBULATORY_CARE_PROVIDER_SITE_OTHER): Admitting: Nurse Practitioner

## 2024-10-30 VITALS — BP 118/72 | HR 60 | Temp 97.9°F | Ht 65.0 in | Wt 144.8 lb

## 2024-10-30 DIAGNOSIS — Z0000 Encounter for general adult medical examination without abnormal findings: Secondary | ICD-10-CM | POA: Diagnosis not present

## 2024-10-30 NOTE — Patient Instructions (Signed)
  Faith Edwards , Thank you for taking time to come for your Medicare Wellness Visit. I appreciate your ongoing commitment to your health goals. Please review the following plan we discussed and let me know if I can assist you in the future.   This is a list of the screening recommended for you and due dates:  Health Maintenance  Topic Date Due   DEXA scan (bone density measurement)  10/13/2021   COVID-19 Vaccine (6 - 2025-26 season) 12/29/2024*   Flu Shot  05/11/2025*   Medicare Annual Wellness Visit  10/30/2025   DTaP/Tdap/Td vaccine (3 - Td or Tdap) 07/02/2031   Pneumococcal Vaccine for age over 95  Completed   Zoster (Shingles) Vaccine  Completed   Meningitis B Vaccine  Aged Out  *Topic was postponed. The date shown is not the original due date.

## 2024-10-30 NOTE — Progress Notes (Signed)
 Subjective:   Faith Edwards is a 89 y.o. female who presents for Medicare Annual (Subsequent) preventive examination.  Visit Complete: In person [facility] clinic   Cardiac Risk Factors include: advanced age (>48men, >55 women);dyslipidemia     Objective:    Today's Vitals   10/30/24 1137  BP: 118/72  Pulse: 60  Temp: 97.9 F (36.6 C)  TempSrc: Temporal  SpO2: 99%  Weight: 144 lb 12.8 oz (65.7 kg)  Height: 5' 5 (1.651 m)   Body mass index is 24.1 kg/m.  Wt Readings from Last 3 Encounters:  10/30/24 144 lb 12.8 oz (65.7 kg)  10/23/24 144 lb 6.4 oz (65.5 kg)  09/03/24 148 lb 9.6 oz (67.4 kg)        10/30/2024   11:26 AM 10/23/2024   11:35 AM 08/19/2023    2:52 PM 12/28/2022    2:11 PM 08/24/2022    7:55 AM 08/14/2022   12:25 PM 08/13/2022   11:53 AM  Advanced Directives  Does Patient Have a Medical Advance Directive? No No No No No No No  Would patient like information on creating a medical advance directive? No - Patient declined No - Patient declined No - Patient declined No - Patient declined No - Patient declined No - Patient declined No - Patient declined    Current Medications (verified) Outpatient Encounter Medications as of 10/30/2024  Medication Sig   atorvastatin  (LIPITOR) 20 MG tablet Take 1 tablet (20 mg total) by mouth daily.   calcium  carbonate (CALCIUM  600) 600 MG TABS tablet Take 1 tablet (600 mg total) by mouth 2 (two) times daily with a meal.   Cholecalciferol (VITAMIN D3) 25 MCG (1000 UT) CAPS Take by mouth once.   Cyanocobalamin  (B-12) 1000 MCG CHEW Chew 2,000 mcg by mouth daily.   sertraline  (ZOLOFT ) 25 MG tablet TAKE 1 TABLET(25 MG) BY MOUTH DAILY   traZODone  (DESYREL ) 50 MG tablet Take 0.5-1 tablets (25-50 mg total) by mouth at bedtime.   zinc  gluconate 50 MG tablet Take 50 mg by mouth daily.   cholecalciferol (VITAMIN D ) 1000 UNITS tablet Take 1,000 Units by mouth daily. (Patient not taking: Reported on 10/30/2024)   denosumab  (PROLIA ) 60 MG/ML  SOSY injection Inject 60 mg into the skin every 6 (six) months. (Patient not taking: Reported on 10/30/2024)   esomeprazole  (NEXIUM ) 40 MG capsule Take 1 capsule (40 mg total) by mouth daily. (Patient not taking: Reported on 10/30/2024)   melatonin 1 MG TABS tablet Take 1 tablet (1 mg total) by mouth at bedtime. (Patient not taking: Reported on 10/30/2024)   UNABLE TO FIND Med Name: Magnesium  Pure (powder) add to 12 oz of water three times daily (1/4 teaspoon) (Patient not taking: Reported on 10/30/2024)   Facility-Administered Encounter Medications as of 10/30/2024  Medication   [START ON 02/22/2025] denosumab  (PROLIA ) injection 60 mg    Allergies (verified) Penicillins and Sulfa  antibiotics   History: Past Medical History:  Diagnosis Date   Anxiety    Arthritis    Asthma    Bronchitis    hx of    Chronic headaches    Headache Clinic   COVID-19    Cystitis    1954   Depression    Ear pain    bilat    Hiatal hernia    History of blood clots    Lungs, per [facility] new patient packet    History of blood transfusion    2010   Hx of blood clots 2007 &  2010   post TKR   Hyperthyroidism    Pelvic fracture (HCC)    hx of in three places secondary to fall    Pneumonia    hx of 1952   Pulmonary embolism (HCC) 1993   on HRT   Right wrist fracture    hx of 1995 has plate secondary to fall    Shaking    hands bilat   Shingles     per [facility] new patient packet    Shortness of breath dyspnea    Skin cancer    basal cell    Spinal stenosis    Trauma    left lower leg 10/22/2014    Urinary frequency    Urticaria    Past Surgical History:  Procedure Laterality Date   ABDOMINAL HYSTERECTOMY  1969    per [facility] new patient packet    ADENOIDECTOMY     BREAST ENHANCEMENT SURGERY  1985   Cancer removal Right    Right leg (Info given by patient)   CHOLECYSTECTOMY  1992    per [facility] new patient packet    COLONOSCOPY     diverticulosis   ESI  11/2013    C7-T1 ; Dr Bonner   EYE SURGERY      bilat cataract surgery    INSERTION OF MESH N/A 07/08/2015   Procedure: INSERTION OF MESH;  Surgeon: Krystal Spinner, MD;  Location: [facility];  Service: General;  Laterality: N/A;   REPLACEMENT TOTAL KNEE BILATERAL  2007 & 2010   SP FACET INJECTION  01/19/2014   RC2-3,3-4,C4-5   TONSILLECTOMY  1945    per [facility] new patient packet    VENTRAL HERNIA REPAIR N/A 07/08/2015   Procedure: VENTRAL ADULT REPAIR VENTRAL INCISIONAL HERNIA REPAIR;  Surgeon: Krystal Spinner, MD;  Location: [facility];  Service: General;  Laterality: N/A;   WRIST FRACTURE SURGERY Bilateral 10  years and 2 years/   WRIST SURGERY Left 06/2016   plate    Family History  Problem Relation Age of Onset   Skin cancer Mother    Pneumonia Mother        1/2 lung loss per [facility] New Patient Packet    Clotting disorder Father        PTE X 2   Skin cancer Father    Heart failure Father        Per [facility] New Patient Packet    Allergies Brother    Asthma Brother    Heart attack Brother        > 55   Bladder Cancer Brother    Diabetes Daughter    Migraines Daughter    Heart attack Daughter    Breast cancer Daughter    Schizophrenia Son        Per [facility] New Patient Packet    Lung disease Son        Per [facility] New Patient Packet    Mental illness Son        suicide by hanging   Diabetes Son    Hepatitis C Son    Sudden death Other        2 M aunts & 2 M uncles in 37s   Stroke Neg Hx    Social History   Socioeconomic History   Marital status: Married    Spouse name: Not on file   Number of children: 3   Years of education: Not on file   Highest education level: Not on file  Occupational History  Occupation: retired  Tobacco Use   Smoking status: Never   Smokeless tobacco: Never  Vaping Use   Vaping status: Never Used  Substance and Sexual Activity   Alcohol use: Not Currently    Alcohol/week: 1.0 standard drink of alcohol    Types: 1 Glasses of wine per week   Drug use: Never   Sexual activity: Not Currently  Other  Topics Concern   Not on file  Social History Narrative   Per [facility] New Patient Packet, abstracted on 06/06/2020:      Diet: Keto Diet x 1 month, no red meat       Caffeine: Yes, coffee      Married, if yes what year: Yes, 1990 (last, 3rd)      Do you live in a house, apartment, assisted living, condo, trailer, ect: Hose      Is it one or more stories: 3 stories, 1 person       Pets: 1 medical laboratory scientific officer       Current/Past profession: Geographical Information Systems Officer, 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 No   Do you have difficulty preparing food or eating? No   Do you have difficulty managing your medications? No   Do you have difficulty managing your finances? No   Do you have difficulty affording your medications? No   Social Drivers of Corporate Investment Banker Strain: Low Risk  (07/18/2018)   Overall Financial Resource Strain (CARDIA)    Difficulty of Paying Living Expenses: Not hard at all  Food Insecurity: No Food Insecurity (09/18/2024)   Hunger Vital Sign    Worried About Running Out of Food in the Last Year: Never true    Ran Out of Food in the Last Year: Never true  Transportation Needs: No Transportation Needs (09/18/2024)   PRAPARE - Administrator, Civil Service (Medical): No    Lack of Transportation (Non-Medical): No  Physical Activity: Unknown (08/26/2019)   Exercise Vital Sign    Days of Exercise per Week: 5 days    Minutes of Exercise per Session: Not on file  Stress: No Stress Concern Present (07/18/2018)   [employer] of Occupational Health - Occupational Stress Questionnaire    Feeling of Stress : Only a little  Social Connections: Socially Integrated (07/18/2018)   Social Connection and Isolation Panel     Frequency of Communication with Friends and Family: More than three times a week    Frequency of Social Gatherings with Friends and Family: More than three times a week    Attends Religious Services: More than 4 times per year    Active Member of [employer] or Organizations: Yes    Attends Engineer, Structural: More than 4 times per year    Marital Status: Married    Tobacco Counseling Counseling given: Not Answered   Clinical Intake:  Pre-visit preparation completed: Yes                     Activities of Daily Living    10/30/2024  11:51 AM  In your present state of health, do you have any difficulty performing the following activities:  Hearing? 1  Vision? 0  Difficulty concentrating or making decisions? 1  Walking or climbing stairs? 0  Dressing or bathing? 0  Doing errands, shopping? 1  Preparing Food and eating ? N  Using the Toilet? N  In the past six months, have you accidently leaked urine? N  Do you have problems with loss of bowel control? N  Managing your Medications? Y  Comment husband helps  Managing your Finances? Y  Comment husband helps  Housekeeping or managing your Housekeeping? Y  Comment husband helps    Patient Care Team: Caro Harlene POUR, NP as PCP - General (Geriatric Medicine) Darlean Ozell NOVAK, MD as Consulting Physician (Pulmonary Disease) Cleotilde Sewer, OD (Optometry) Tasia Lung, MD as Consulting Physician (Psychiatry) Trixie File, MD as Consulting Physician (Internal Medicine) Sissy Sieving, MD as Referring Physician (Orthopedic Surgery) Fleeta Smock, Lamar BROCKS, MD as Consulting Physician (Allergy and Immunology) Aneita Gwendlyn DASEN, MD (Inactive) as Consulting Physician (Gastroenterology) Arnaldo Juliene RAMAN, MD as Attending Physician (Family Medicine) Carlie Clark, MD as Consulting Physician (Otolaryngology) Lewis, Jasmine D, LCSW as VBCI Care Management (Licensed Clinical Social Worker)  Indicate any recent  Medical Services you may have received from other than [facility] providers in the past year (date may be approximate).     Assessment:   This is a routine wellness examination for [place].  Hearing/Vision screen Hearing Screening - Comments:: Done in the past year at [facility] Vision Screening - Comments:: Patient last exam greater than 12 months   Goals Addressed   None    Depression Screen    10/30/2024   11:28 AM 10/23/2024   11:34 AM 08/21/2024   12:01 PM 03/16/2024    9:12 AM 02/21/2024    2:49 PM 08/19/2023    2:50 PM 08/24/2022    9:27 AM  PHQ 2/9 Scores  PHQ - 2 Score 0 0 3 0 0 0 0  PHQ- 9 Score    0     Exception Documentation      Other- indicate reason in comment box   Not completed      Patient is under the care of psych     Fall Risk    10/30/2024   11:28 AM 10/23/2024   11:34 AM 09/18/2024    9:32 AM 08/21/2024   12:01 PM 03/16/2024    9:11 AM  Fall Risk   Falls in the past year? 0 0 0 0 0  Number falls in past yr: 0 0 0 0 0  Injury with Fall? 0 0 0  0  Risk for fall due to : No Fall Risks No Fall Risks No Fall Risks No Fall Risks No Fall Risks  Follow up Falls evaluation completed Falls evaluation completed  Falls evaluation completed Falls evaluation completed    MEDICARE RISK AT HOME:    TIMED UP AND GO:  Was the test performed?  No    Cognitive Function:    08/24/2022    9:28 AM 06/20/2020    3:11 PM 08/26/2019    3:41 PM 07/18/2018    2:57 PM 05/07/2017    3:47 PM  MMSE - Mini Mental State Exam  Not completed:   Refused    Orientation to time 4 5  5 3    Orientation to Place 4 5  5 5    Registration 3 3  3 3    Attention/ Calculation  4 5  5 5    Recall 0 3  0 3   Language- name 2 objects 2 2  2 2    Language- repeat 0 1  1 1   Language- follow 3 step command 3 3  3 3    Language- read & follow direction 1 1  1 1    Write a sentence 1 1  1 1    Copy design 0 1  1 1    Total score 22 30  27 28       Data saved with a previous flowsheet row definition         02/21/2024    2:55 PM  6CIT Screen  What Year? 4 points  What month? 3 points  What time? 3 points  Count back from 20 2 points  Months in reverse 4 points  Repeat phrase 10 points  Total Score 26 points    Immunizations Immunization History  Administered Date(s) Administered   Fluad Quad(high Dose 65+) 09/24/2019, 10/24/2020, 09/21/2021   INFLUENZA, HIGH DOSE SEASONAL PF 11/13/2022   Influenza Whole 10/28/2008, 09/28/2009, 10/08/2011   Influenza, Seasonal, Injecte, Preservative Fre 11/03/2013   Influenza,inj,Quad PF,6+ Mos 10/05/2014, 09/30/2015   Influenza-Unspecified 09/25/2016, 09/09/2017, 07/31/2018, 11/13/2022   Moderna Covid-19 Fall Seasonal Vaccine 46yrs & older 11/13/2022   PFIZER(Purple Top)SARS-COV-2 Vaccination 01/01/2020, 01/30/2020, 02/22/2020, 12/11/2020   Pneumococcal Conjugate-13 09/30/2015   Pneumococcal Polysaccharide-23 01/07/2014   Respiratory Syncytial Virus Vaccine,Recomb Aduvanted(Arexvy) 12/18/2022   Tdap 10/10/2014, 07/01/2021   Zoster Recombinant(Shingrix) 07/05/2018, 09/09/2018   Zoster, Live 03/18/2015    TDAP status: Up to date  Flu Vaccine status: Due, Education has been provided regarding the importance of this vaccine. Advised may receive this vaccine at local pharmacy or Health Dept. Aware to provide a copy of the vaccination record if obtained from local pharmacy or Health Dept. Verbalized acceptance and understanding.  Pneumococcal vaccine status: Up to date  Covid-19 vaccine status: Information provided on how to obtain vaccines.   Qualifies for Shingles Vaccine? Yes   Zostavax completed No   Shingrix Completed?: Yes  Screening Tests Health Maintenance  Topic Date Due   DEXA SCAN  10/13/2021   COVID-19 Vaccine (6 - 2025-26 season) 12/29/2024 (Originally 08/31/2024)   Influenza Vaccine  05/11/2025 (Originally 07/31/2024)   Medicare Annual Wellness (AWV)  10/30/2025   DTaP/Tdap/Td (3 - Td or Tdap) 07/02/2031   Pneumococcal  Vaccine: 50+ Years  Completed   Zoster Vaccines- Shingrix  Completed   Meningococcal B Vaccine  Aged Out    Health Maintenance  Health Maintenance Due  Topic Date Due   DEXA SCAN  10/13/2021    Colorectal cancer screening: No longer required.   Mammogram status: No longer required due to age.  Bone Density status: Ordered placed. Pt provided with contact info and advised to call to schedule appt.   Lung Cancer Screening Referral na  Additional Screening:  Hepatitis C Screening: does not qualify  Vision Screening: Recommended annual ophthalmology exams for early detection of glaucoma and other disorders of the eye. Is the patient up to date with their annual eye exam?  No  Who is the provider or what is the name of the office in which the patient attends annual eye exams? none If pt is not established with a provider, would they like to be referred to a provider to establish care? Yes .   Dental Screening: Recommended annual dental exams for proper oral hygiene  Community Resource Referral / Chronic Care Management: CRR required this visit?  No   CCM required this visit?  No     Plan:     I have personally reviewed and noted the following in the patient's chart:   Medical and social history Use of alcohol, tobacco or illicit drugs  Current medications and supplements including opioid prescriptions. Patient is not currently taking opioid prescriptions. Functional ability and status Nutritional status Physical activity Advanced directives List of other physicians Hospitalizations, surgeries, and ER visits in previous 12 months Vitals Screenings to include cognitive, depression, and falls Referrals and appointments  In addition, I have reviewed and discussed with patient certain preventive protocols, quality metrics, and best practice recommendations. A written personalized care plan for preventive services as well as general preventive health recommendations were  provided to patient.     Harlene MARLA An, NP   10/30/2024

## 2024-10-31 DIAGNOSIS — F02B0 Dementia in other diseases classified elsewhere, moderate, without behavioral disturbance, psychotic disturbance, mood disturbance, and anxiety: Secondary | ICD-10-CM | POA: Diagnosis not present

## 2024-11-02 ENCOUNTER — Ambulatory Visit: Admitting: Podiatry

## 2024-11-02 DIAGNOSIS — M79674 Pain in right toe(s): Secondary | ICD-10-CM | POA: Diagnosis not present

## 2024-11-02 DIAGNOSIS — B351 Tinea unguium: Secondary | ICD-10-CM | POA: Diagnosis not present

## 2024-11-02 DIAGNOSIS — M79675 Pain in left toe(s): Secondary | ICD-10-CM

## 2024-11-02 NOTE — Progress Notes (Unsigned)
 Subjective:   Patient ID: Faith Edwards, female   DOB: 89 y.o.   MRN: [id number]   HPI Chief Complaint  Patient presents with   Nail Problem    Pt stated that her nail is really thick     89 year old female presents the office with above concerns.  She presents today with her husband.  She states her nails are thick and elongated particular left hallux toes and is significantly thickened and does cause discomfort mostly on the corners.  She does not report any drainage or pus.  No injuries.  Review of Systems  All other systems reviewed and are negative.   Past Medical History:  Diagnosis Date   Anxiety    Arthritis    Asthma    Bronchitis    hx of    Chronic headaches    Headache Clinic   COVID-19    Cystitis    1954   Depression    Ear pain    bilat    Hiatal hernia    History of blood clots    Lungs, per [facility] new patient packet    History of blood transfusion    2010   Hx of blood clots 2007 & 2010   post TKR   Hyperthyroidism    Pelvic fracture (HCC)    hx of in three places secondary to fall    Pneumonia    hx of 1952   Pulmonary embolism (HCC) 1993   on HRT   Right wrist fracture    hx of 1995 has plate secondary to fall    Shaking    hands bilat   Shingles     per [facility] new patient packet    Shortness of breath dyspnea    Skin cancer    basal cell    Spinal stenosis    Trauma    left lower leg 10/22/2014    Urinary frequency    Urticaria     Past Surgical History:  Procedure Laterality Date   ABDOMINAL HYSTERECTOMY  1969    per [facility] new patient packet    ADENOIDECTOMY     BREAST ENHANCEMENT SURGERY  1985   Cancer removal Right    Right leg (Info given by patient)   CHOLECYSTECTOMY  1992    per [facility] new patient packet    COLONOSCOPY     diverticulosis   ESI  11/2013    C7-T1 ; Dr Bonner   EYE SURGERY     bilat cataract surgery    INSERTION OF MESH N/A 07/08/2015   Procedure: INSERTION OF MESH;  Surgeon: Krystal Spinner, MD;  Location: [facility];   Service: General;  Laterality: N/A;   REPLACEMENT TOTAL KNEE BILATERAL  2007 & 2010   SP FACET INJECTION  01/19/2014   RC2-3,3-4,C4-5   TONSILLECTOMY  1945    per [facility] new patient packet    VENTRAL HERNIA REPAIR N/A 07/08/2015   Procedure: VENTRAL ADULT REPAIR VENTRAL INCISIONAL HERNIA REPAIR;  Surgeon: Krystal Spinner, MD;  Location: [facility];  Service: General;  Laterality: N/A;   WRIST FRACTURE SURGERY Bilateral 10  years and 2 years/   WRIST SURGERY Left 06/2016   plate      Current Outpatient Medications:    atorvastatin  (LIPITOR) 20 MG tablet, Take 1 tablet (20 mg total) by mouth daily., Disp: 90 tablet, Rfl: 3   calcium  carbonate (CALCIUM  600) 600 MG TABS tablet, Take 1 tablet (600 mg total) by mouth 2 (two) times  daily with a meal., Disp: 90 tablet, Rfl: 1   Cholecalciferol (VITAMIN D3) 25 MCG (1000 UT) CAPS, Take by mouth once., Disp: , Rfl:    Cyanocobalamin  (B-12) 1000 MCG CHEW, Chew 2,000 mcg by mouth daily., Disp: , Rfl:    denosumab  (PROLIA ) 60 MG/ML SOSY injection, Inject 60 mg into the skin every 6 (six) months. (Patient not taking: Reported on 10/30/2024), Disp: , Rfl:    sertraline  (ZOLOFT ) 25 MG tablet, TAKE 1 TABLET(25 MG) BY MOUTH DAILY, Disp: 30 tablet, Rfl: 1   traZODone  (DESYREL ) 50 MG tablet, Take 0.5-1 tablets (25-50 mg total) by mouth at bedtime., Disp: 30 tablet, Rfl: 1   zinc  gluconate 50 MG tablet, Take 50 mg by mouth daily., Disp: , Rfl:   Current Facility-Administered Medications:    [START ON 02/22/2025] denosumab  (PROLIA ) injection 60 mg, 60 mg, Subcutaneous, Q6 months, Eubanks, [name] K, NP  Allergies  Allergen Reactions   Penicillins Hives   Sulfa  Antibiotics Hives and Nausea And Vomiting    7-15         Objective:  Physical Exam  General: AAO x3, NAD  Dermatological: The left hallux toenail particular is hypertrophic, dystrophic and there is subungual debris present.  There is skin, nailbed of the wound bed nail borders most of the lateral  aspect.  All the nails are elongated mildly hypertrophic with yellow discoloration.  She gets tenderness nails 1-5 bilaterally.  There are no open lesions.  Vascular: Dorsalis Pedis artery and Posterior Tibial artery pedal pulses are 2/4 bilateral with immedate capillary fill time. There is no pain with calf compression, swelling, warmth, erythema.   Neruologic: Grossly intact via light touch bilateral.   Musculoskeletal: No other areas of discomfort.       Assessment:   89 year old female with symptomatic onychosis     Plan:  -Treatment options discussed including all alternatives, risks, and complications -Etiology of symptoms were discussed -Nails debrided 10 without complications or bleeding.  In particular I was able to debride the hallux toenail any complications or bleeding.  There is some discomfort upon debridement and I recommended routine debridement to help prevent the nail from getting as thick.  Monitor for any signs or symptoms of infection.  Consider nail removal if needed. -Daily foot inspection -Follow-up in 3 months or sooner if any problems arise. In the meantime, encouraged to call the office with any questions, concerns, change in symptoms.   Donnice Fees, DPM

## 2024-11-06 ENCOUNTER — Telehealth: Payer: Self-pay | Admitting: Licensed Clinical Social Worker

## 2024-11-06 NOTE — Patient Outreach (Signed)
 Caregiver called LCSW. He was unable to discuss any patient care needs. LCSW informed him of upcoming appt scheduled on 11/19. Caregiver agreeable and agreed to call LCSW sooner, if needed. No additional concerns noted

## 2024-11-06 NOTE — Patient Instructions (Signed)
 Visit Information  Thank you for taking time to visit with me today. Please don't hesitate to contact me if I can be of assistance to you before our next scheduled appointment.  Your next care management appointment is by telephone on 11/19 at 10 AM  Please call the care guide team at [phone] if you need to cancel, schedule, or reschedule an appointment.   Please call the Suicide and Crisis Lifeline: 988 go to [facility] Urgent [facility] [address], [place] [phone]) call 911 if you are experiencing a Mental Health or Behavioral Health Crisis or need someone to talk to.  Rolin Kerns, LCSW [place]  [facility] Clinical Social Worker Direct Dial: [phone]  Fax: [phone] Website: [email or web address] 11:58 AM

## 2024-11-10 ENCOUNTER — Telehealth: Payer: Self-pay

## 2024-11-10 ENCOUNTER — Other Ambulatory Visit: Payer: Self-pay | Admitting: Nurse Practitioner

## 2024-11-10 DIAGNOSIS — F02B4 Dementia in other diseases classified elsewhere, moderate, with anxiety: Secondary | ICD-10-CM

## 2024-11-10 MED ORDER — MEMANTINE HCL 10 MG PO TABS: 10.0000 mg | ORAL_TABLET | Freq: Two times a day (BID) | ORAL | 3 refills | Status: AC

## 2024-11-10 MED ORDER — MEMANTINE HCL 28 X 5 MG & 21 X 10 MG PO TABS: ORAL_TABLET | ORAL | 12 refills | Status: DC

## 2024-11-10 NOTE — Telephone Encounter (Signed)
 Would have her decrease zoloft  to half tablet daily for 5 days then stop.  Will send in namenda titration pack to pharmacy to start

## 2024-11-10 NOTE — Telephone Encounter (Signed)
 Contacted Mr. Asher received vm left vm and office call back number.

## 2024-11-10 NOTE — Progress Notes (Signed)
 To start namenda titration pack, once complete start namenda 10 mg by mouth twice daily which has also been sent to pharmacy

## 2024-11-10 NOTE — Telephone Encounter (Signed)
 Pt husband Mr. Hoaglund contacted regarding the side effects patient is having regarding zoloft  , he stated that patient hand has been shaking , patient has been having short term crying. He stated that Caro Harlene POUR, NP informed them during last office visit to keep her updated. Patients husband stated they are interested in the medication that can help with memory loss and wanted to know what the next steps should be.

## 2024-11-12 ENCOUNTER — Telehealth: Payer: Self-pay

## 2024-11-12 NOTE — Telephone Encounter (Signed)
 The following message was received from the pharmacy:

## 2024-11-13 ENCOUNTER — Other Ambulatory Visit: Payer: Self-pay | Admitting: Orthopedic Surgery

## 2024-11-13 DIAGNOSIS — F02B4 Dementia in other diseases classified elsewhere, moderate, with anxiety: Secondary | ICD-10-CM

## 2024-11-13 MED ORDER — MEMANTINE HCL 5 MG PO TABS
5.0000 mg | ORAL_TABLET | ORAL | 0 refills | Status: AC
Start: 2024-11-13 — End: ?

## 2024-11-13 MED ORDER — MEMANTINE HCL 28 X 5 MG & 21 X 10 MG PO TABS
ORAL_TABLET | ORAL | 12 refills | Status: AC
Start: 2024-11-13 — End: ?

## 2024-11-13 NOTE — Telephone Encounter (Signed)
 Done

## 2024-11-17 ENCOUNTER — Ambulatory Visit: Admitting: Adult Health

## 2024-11-17 ENCOUNTER — Encounter: Payer: Self-pay | Admitting: Adult Health

## 2024-11-17 VITALS — BP 120/60 | HR 72 | Temp 97.6°F | Resp 18 | Ht 65.0 in | Wt 144.6 lb

## 2024-11-17 DIAGNOSIS — F331 Major depressive disorder, recurrent, moderate: Secondary | ICD-10-CM

## 2024-11-17 DIAGNOSIS — G301 Alzheimer's disease with late onset: Secondary | ICD-10-CM

## 2024-11-17 DIAGNOSIS — E785 Hyperlipidemia, unspecified: Secondary | ICD-10-CM | POA: Diagnosis not present

## 2024-11-17 DIAGNOSIS — F5101 Primary insomnia: Secondary | ICD-10-CM

## 2024-11-17 DIAGNOSIS — M25512 Pain in left shoulder: Secondary | ICD-10-CM

## 2024-11-17 DIAGNOSIS — F02B4 Dementia in other diseases classified elsewhere, moderate, with anxiety: Secondary | ICD-10-CM | POA: Diagnosis not present

## 2024-11-17 DIAGNOSIS — Z9882 Breast implant status: Secondary | ICD-10-CM

## 2024-11-17 DIAGNOSIS — L853 Xerosis cutis: Secondary | ICD-10-CM

## 2024-11-17 DIAGNOSIS — M79675 Pain in left toe(s): Secondary | ICD-10-CM

## 2024-11-17 MED ORDER — ATORVASTATIN CALCIUM 10 MG PO TABS
10.0000 mg | ORAL_TABLET | Freq: Every day | ORAL | Status: AC
Start: 2024-11-17 — End: ?

## 2024-11-17 MED ORDER — CETAPHIL MOISTURIZING EX CREA: TOPICAL_CREAM | Freq: Two times a day (BID) | CUTANEOUS | 0 refills | Status: AC

## 2024-11-17 MED ORDER — ACETAMINOPHEN 500 MG PO TABS
1000.0000 mg | ORAL_TABLET | Freq: Three times a day (TID) | ORAL | Status: AC | PRN
Start: 2024-11-17 — End: ?

## 2024-11-17 NOTE — Progress Notes (Signed)
 [facility] clinic  Provider:  Jereld Serum DNP  Code Status:  Full Code  Goals of Care:     10/30/2024   11:26 AM  Advanced Directives  Does Patient Have a Medical Advance Directive? No  Would patient like information on creating a medical advance directive? No - Patient declined     Chief Complaint  Patient presents with   Back Pain    Back pain, arm and toe     Discussed the use of AI scribe software for clinical note transcription with the patient, who gave verbal consent to proceed.  HPI: Patient is a 89 y.o. female seen today for an acute visit for left shoulder pain. She is accompanied by her husband, who is also her caregiver.  She has been experiencing left shoulder pain for at least a month, rated as 8 out of 10 in severity. The pain is persistent and worsens when she sleeps on her left side. She has been taking Advil , one pill every four hours as needed, but is unsure of the dosage. The pain radiates down her arm. She has not experienced any falls or trauma to the shoulder.  She has chronic kidney disease stage 3A, which is relevant to her medication choices. She has a history of dementia, specifically Alzheimer's type, and experiences memory loss. Her husband notes that her memory is better in the afternoons compared to the mornings. She has been taking sertraline  25 mg for anxiety and depression, which has helped with her emotional regulation.  She experiences itching on her back. Her husband applies cortisone cream. She has pain in her left great toe, which has been present for about two weeks. The pain is intermittent and not currently present. She has seen a podiatrist who cleaned her toenails and removed dead skin, but the pain persists. She often wears sandals to avoid pressure on the toe.  She experiences tinnitus in both ears, primarily at night, and does not wear her hearing aids during sleep. Her hearing aids are regularly maintained by a professional. She  has a history of breast implants, which she has had for over 34 years. She reported pain under her left breast, but it has only occurred a few times and is not currently a concern.     Past Medical History:  Diagnosis Date   Anxiety    Arthritis    Asthma    Bronchitis    hx of    Chronic headaches    [facility]   COVID-19    Cystitis    1954   Depression    Ear pain    bilat    Hiatal hernia    History of blood clots    Lungs, per [facility] new patient packet    History of blood transfusion    2010   Hx of blood clots 2007 & 2010   post TKR   Hyperthyroidism    Pelvic fracture (HCC)    hx of in three places secondary to fall    Pneumonia    hx of 1952   Pulmonary embolism (HCC) 1993   on HRT   Right wrist fracture    hx of 1995 has plate secondary to fall    Shaking    hands bilat   Shingles     per [facility] new patient packet    Shortness of breath dyspnea    Skin cancer    basal cell    Spinal stenosis    Trauma  left lower leg 10/22/2014    Urinary frequency    Urticaria     Past Surgical History:  Procedure Laterality Date   ABDOMINAL HYSTERECTOMY  1969    per [facility] new patient packet    ADENOIDECTOMY     BREAST ENHANCEMENT SURGERY  1985   Cancer removal Right    Right leg (Info given by patient)   CHOLECYSTECTOMY  1992    per [facility] new patient packet    COLONOSCOPY     diverticulosis   ESI  11/2013    C7-T1 ; Dr Bonner   EYE SURGERY     bilat cataract surgery    INSERTION OF MESH N/A 07/08/2015   Procedure: INSERTION OF MESH;  Surgeon: Krystal Spinner, MD;  Location: [facility];  Service: General;  Laterality: N/A;   REPLACEMENT TOTAL KNEE BILATERAL  2007 & 2010   SP FACET INJECTION  01/19/2014   RC2-3,3-4,C4-5   TONSILLECTOMY  1945    per [facility] new patient packet    VENTRAL HERNIA REPAIR N/A 07/08/2015   Procedure: VENTRAL ADULT REPAIR VENTRAL INCISIONAL HERNIA REPAIR;  Surgeon: Krystal Spinner, MD;  Location: [facility];  Service: General;  Laterality: N/A;    WRIST FRACTURE SURGERY Bilateral 10  years and 2 years/   WRIST SURGERY Left 06/2016   plate     Allergies  Allergen Reactions   Penicillins Hives   Sulfa  Antibiotics Hives and Nausea And Vomiting    7-15    Outpatient Encounter Medications as of 11/17/2024  Medication Sig   acetaminophen  (TYLENOL ) 500 MG tablet Take 2 tablets (1,000 mg total) by mouth every 8 (eight) hours as needed.   atorvastatin  (LIPITOR) 10 MG tablet Take 1 tablet (10 mg total) by mouth daily.   calcium  carbonate (CALCIUM  600) 600 MG TABS tablet Take 1 tablet (600 mg total) by mouth 2 (two) times daily with a meal.   Cholecalciferol (VITAMIN D3) 25 MCG (1000 UT) CAPS Take by mouth once.   Cyanocobalamin  (B-12) 1000 MCG CHEW Chew 2,000 mcg by mouth daily.   denosumab  (PROLIA ) 60 MG/ML SOSY injection Inject 60 mg into the skin every 6 (six) months.   Emollient (CETAPHIL) cream Apply topically 2 (two) times daily.   memantine  (NAMENDA  TITRATION PAK) tablet pack 5 mg/day for =1 week; 5 mg twice daily for =1 week; 15 mg/day given in 5 mg and 10 mg separated doses for =1 week; then 10 mg twice daily   [START ON 12/10/2024] memantine  (NAMENDA ) 10 MG tablet Take 1 tablet (10 mg total) by mouth 2 (two) times daily.   memantine  (NAMENDA ) 5 MG tablet Take 1 tablet (5 mg total) by mouth as directed. 5 mg/day for =1 week; 5 mg twice daily for =1 week; 15 mg/day given in 5 mg and 10 mg separated doses for =1 week; then 10 mg twice daily   sertraline  (ZOLOFT ) 25 MG tablet TAKE 1 TABLET(25 MG) BY MOUTH DAILY   zinc  gluconate 50 MG tablet Take 50 mg by mouth daily.   [DISCONTINUED] atorvastatin  (LIPITOR) 20 MG tablet Take 1 tablet (20 mg total) by mouth daily.   [DISCONTINUED] traZODone  (DESYREL ) 50 MG tablet Take 0.5-1 tablets (25-50 mg total) by mouth at bedtime.   Facility-Administered Encounter Medications as of 11/17/2024  Medication   [START ON 02/22/2025] denosumab  (PROLIA ) injection 60 mg    Review of Systems:  Review  of Systems  Constitutional:  Negative for appetite change, chills, fatigue and fever.  HENT:  Positive for  tinnitus. Negative for congestion, hearing loss, rhinorrhea and sore throat.   Eyes: Negative.   Respiratory:  Negative for cough, shortness of breath and wheezing.   Cardiovascular:  Negative for chest pain, palpitations and leg swelling.  Gastrointestinal:  Negative for abdominal pain, constipation, diarrhea, nausea and vomiting.  Genitourinary:  Negative for dysuria.  Musculoskeletal:  Negative for arthralgias and myalgias.       Left shoulder pain 8/10.  Skin:  Negative for color change, rash and wound.       Left great toe pain  Neurological:  Negative for dizziness, weakness and headaches.  Psychiatric/Behavioral:  Negative for behavioral problems. The patient is not nervous/anxious.     Health Maintenance  Topic Date Due   Bone Density Scan  10/13/2021   COVID-19 Vaccine (6 - 2025-26 season) 12/29/2024 (Originally 08/31/2024)   Influenza Vaccine  05/11/2025 (Originally 07/31/2024)   Medicare Annual Wellness (AWV)  10/30/2025   DTaP/Tdap/Td (3 - Td or Tdap) 07/02/2031   Pneumococcal Vaccine: 50+ Years  Completed   Zoster Vaccines- Shingrix  Completed   Meningococcal B Vaccine  Aged Out    Physical Exam: Vitals:   11/17/24 1052  BP: 120/60  Pulse: 72  Resp: 18  Temp: 97.6 F (36.4 C)  SpO2: 92%  Weight: 144 lb 9.6 oz (65.6 kg)  Height: 5' 5 (1.651 m)   Body mass index is 24.06 kg/m. Physical Exam Constitutional:      Appearance: Normal appearance.  HENT:     Head: Normocephalic and atraumatic.     Nose: Nose normal.     Mouth/Throat:     Mouth: Mucous membranes are moist.  Eyes:     Conjunctiva/sclera: Conjunctivae normal.  Cardiovascular:     Rate and Rhythm: Normal rate and regular rhythm.  Pulmonary:     Effort: Pulmonary effort is normal.     Breath sounds: Normal breath sounds.  Abdominal:     General: Bowel sounds are normal.     Palpations:  Abdomen is soft.  Musculoskeletal:        General: Normal range of motion.     Cervical back: Normal range of motion.  Skin:    General: Skin is warm and dry.  Neurological:     Mental Status: She is alert.  Psychiatric:        Mood and Affect: Mood normal.        Behavior: Behavior normal.        Judgment: Judgment normal.     Labs reviewed: Basic Metabolic Panel: Recent Labs    02/07/24 0949 02/21/24 1517 08/10/24 1004 10/23/24 1217  NA 139  --  137 136  K 4.6  --  4.8 4.9  CL 104  --  104 103  CO2 26  --  23 25  GLUCOSE 111*  --  101* 105*  BUN 21  --  25 20  CREATININE 1.08*  --  1.05* 0.94  CALCIUM  9.9  --  9.4 9.5  TSH  --  3.72  --   --    Liver Function Tests: Recent Labs    02/07/24 0949 08/10/24 1004  AST 19 25  ALT 19 23  BILITOT 0.8 1.1  PROT 6.9 6.5   No results for input(s): LIPASE, AMYLASE in the last 8760 hours. No results for input(s): AMMONIA in the last 8760 hours. CBC: Recent Labs    02/21/24 1517 08/10/24 1004  WBC 6.9 8.2  NEUTROABS 3,664 5,797  HGB 13.2 12.8  HCT 39.9 39.2  MCV 96.6 99.5  PLT 290 269   Lipid Panel: Recent Labs    08/10/24 1004  CHOL 152  HDL 75  LDLCALC 61  TRIG 78  CHOLHDL 2.0   Lab Results  Component Value Date   HGBA1C 5.6 01/05/2021    Procedures since last visit: No results found.  Assessment/Plan  1. Acute pain of left shoulder (Primary) -  Chronic left shoulder pain likely due to osteoarthritis, exacerbated by sleeping position. - Ordered x-ray of left shoulder at [facility]. - Recommended Tylenol  1000 mg every 8 hours as needed for pain. - Advised warm compress on the shoulder. - Encouraged changing sleeping position. - acetaminophen  (TYLENOL ) 500 MG tablet; Take 2 tablets (1,000 mg total) by mouth every 8 (eight) hours as needed. - DG Shoulder Left  2. Dry skin -  Dry skin causing itching, no infection or rash present. - Recommended Cetaphil cream twice daily as  needed. - Advised against prolonged use of cortisone cream. - Emollient (CETAPHIL) cream; Apply topically 2 (two) times daily.  Dispense: 90 g; Refill: 0  3. Great toe pain, left -  Intermittent toe pain possibly due to gout, exacerbated by footwear. - Ordered blood test to check uric acid levels. - Advised wearing appropriate footwear. - Uric Acid  4. Hyperlipidemia, unspecified hyperlipidemia type -  Managed with atorvastatin , concerns about memory loss. - Reduced atorvastatin  dosage to 10 mg.  - atorvastatin  (LIPITOR) 10 MG tablet; Take 1 tablet (10 mg total) by mouth daily.  5. Moderate episode of recurrent major depressive disorder (HCC) -  Managed with sertraline , transitioning to memantine  for memory loss. - Taper sertraline  by taking half a tablet for five days, then discontinue. - Start memantine  as prescribed.  6. Moderate late onset Alzheimer's dementia with anxiety (HCC) -  Start memantine  as prescribed. - Monitor behavior and memory function.  7. Primary insomnia -  Managed with trazodone . - Continue trazodone  for insomnia management.      Labs/tests ordered:  left shoulder x-ray, uric acid   Return if symptoms worsen or fail to improve.  Faith Silverthorne Medina-Vargas, NP

## 2024-11-18 ENCOUNTER — Other Ambulatory Visit: Payer: Self-pay | Admitting: Licensed Clinical Social Worker

## 2024-11-18 LAB — URIC ACID: Uric Acid, Serum: 4.5 mg/dL (ref 2.5–7.0)

## 2024-11-24 ENCOUNTER — Ambulatory Visit: Payer: Self-pay | Admitting: Adult Health

## 2024-11-24 ENCOUNTER — Other Ambulatory Visit: Payer: Self-pay | Admitting: Nurse Practitioner

## 2024-11-24 ENCOUNTER — Telehealth: Payer: Self-pay

## 2024-11-24 DIAGNOSIS — G4700 Insomnia, unspecified: Secondary | ICD-10-CM

## 2024-11-24 NOTE — Progress Notes (Signed)
-    uric acid normal level, no gout

## 2024-11-24 NOTE — Telephone Encounter (Signed)
 1.) Labs order by Monina   2.) Patient does not need an appointment for xray (can walk in)

## 2024-11-24 NOTE — Telephone Encounter (Signed)
 High Risk Warning Populated when attempting to refill, I will send to Provider for further review

## 2024-11-24 NOTE — Telephone Encounter (Signed)
 Copied from CRM #[id number]. Topic: Clinical - Lab/Test Results >> Nov 24, 2024 11:02 AM Graeme ORN wrote: Reason for CRM: Patient husband called. States at last appt blood sample was taking for toe to rule out gout. States he has not heard back about results. No toes for results to provide patient. He would also like a call to schedule appt for xray with imaging. Attempted to transfer to imaging. Did not have time to wait. Thank you
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
## 2024-12-01 ENCOUNTER — Other Ambulatory Visit

## 2024-12-01 ENCOUNTER — Encounter: Payer: Self-pay | Admitting: Physician Assistant

## 2024-12-01 ENCOUNTER — Ambulatory Visit

## 2024-12-01 ENCOUNTER — Ambulatory Visit (INDEPENDENT_AMBULATORY_CARE_PROVIDER_SITE_OTHER): Admitting: Physician Assistant

## 2024-12-01 VITALS — BP 125/72 | HR 81 | Resp 20 | Wt 142.0 lb

## 2024-12-01 DIAGNOSIS — R413 Other amnesia: Secondary | ICD-10-CM | POA: Diagnosis not present

## 2024-12-01 NOTE — Patient Instructions (Signed)
 It was a pleasure to see you today at our office.   Recommendations:   MRI of the brain, the radiology office will call you to arrange you appointment  Check labs today  [address] Follow up March 11 at 11:30  OK to take memantine  as directed and later donepezil  Recommend visiting the website :  Dementia Success Path to better understand some behaviors related to memory loss.  For psychiatric meds, mood meds: Please have your primary care physician manage these medications.  If you have any severe symptoms of a stroke, or other severe issues such as confusion,severe chills or fever, etc call 911 or go to the ER as you may need to be evaluated further For guidance regarding [facility] and if placement were needed at the facility, contact Social Worker tel: [phone]  For assessment of decision of mental capacity and competency:  Call Dr. Rosaline Nine, geriatric psychiatrist at [phone] Counseling regarding caregiver distress, including caregiver depression, anxiety and issues regarding community resources, adult day care programs, adult living facilities, or memory care questions:  please contact your  Primary Doctor's Social Worker   FOR Memory  decline, memory medications: Call our office [phone]    [email or web address]   RECOMMENDATIONS FOR ALL PATIENTS WITH MEMORY PROBLEMS: 1. Continue to exercise (Recommend 30 minutes of walking everyday, or 3 hours every week) 2. Increase social interactions - continue going to [place] and enjoy social gatherings with friends and family 3. Eat healthy, avoid fried foods and eat more fruits and vegetables 4. Maintain adequate blood pressure, blood sugar, and blood cholesterol level. Reducing the risk of stroke and cardiovascular disease also helps promoting better memory. 5. Avoid stressful situations. Live a simple life and avoid aggravations. Organize your time and  prepare for the next day in anticipation. 6. Sleep well, avoid any interruptions of sleep and avoid any distractions in the bedroom that may interfere with adequate sleep quality 7. Avoid sugar, avoid sweets as there is a strong link between excessive sugar intake, diabetes, and cognitive impairment We discussed the Mediterranean diet, which has been shown to help patients reduce the risk of progressive memory disorders and reduces cardiovascular risk. This includes eating fish, eat fruits and green leafy vegetables, nuts like almonds and hazelnuts, walnuts, and also use olive oil. Avoid fast foods and fried foods as much as possible. Avoid sweets and sugar as sugar use has been linked to worsening of memory function.  There is always a concern of gradual progression of memory problems. If this is the case, then we may need to adjust level of care according to patient needs. Support, both to the patient and caregiver, should then be put into place.       FALL PRECAUTIONS: Be cautious when walking. Scan the area for obstacles that may increase the risk of trips and falls. When getting up in the mornings, sit up at the edge of the bed for a few minutes before getting out of bed. Consider elevating the bed at the head end to avoid drop of blood pressure when getting up. Walk always in a well-lit room (use night lights in the walls). Avoid area rugs or power cords from appliances in the middle of the walkways. Use a walker or a cane if necessary and consider physical therapy for balance exercise. Get your eyesight checked regularly.  FINANCIAL OVERSIGHT: Supervision, especially oversight when making financial decisions or transactions is also recommended.  HOME SAFETY: Consider the safety of the  kitchen when operating appliances like stoves, microwave oven, and blender. Consider having supervision and share cooking responsibilities until no longer able to participate in those. Accidents with firearms and  other hazards in the house should be identified and addressed as well.   ABILITY TO BE LEFT ALONE: If patient is unable to contact 911 operator, consider using LifeLine, or when the need is there, arrange for someone to stay with patients. Smoking is a fire hazard, consider supervision or cessation. Risk of wandering should be assessed by caregiver and if detected at any point, supervision and safe proof recommendations should be instituted.  MEDICATION SUPERVISION: Inability to self-administer medication needs to be constantly addressed. Implement a mechanism to ensure safe administration of the medications.      Mediterranean Diet A Mediterranean diet refers to food and lifestyle choices that are based on the traditions of countries located on the [employer]. This way of eating has been shown to help prevent certain conditions and improve outcomes for people who have chronic diseases, like kidney disease and heart disease. What are tips for following this plan? Lifestyle  Cook and eat meals together with your family, when possible. Drink enough fluid to keep your urine clear or pale yellow. Be physically active every day. This includes: Aerobic exercise like running or swimming. Leisure activities like gardening, walking, or housework. Get 7-8 hours of sleep each night. If recommended by your health care provider, drink red wine in moderation. This means 1 glass a day for nonpregnant women and 2 glasses a day for men. A glass of wine equals 5 oz (150 mL). Reading food labels  Check the serving size of packaged foods. For foods such as rice and pasta, the serving size refers to the amount of cooked product, not dry. Check the total fat in packaged foods. Avoid foods that have saturated fat or trans fats. Check the ingredients list for added sugars, such as corn syrup. Shopping  At the grocery store, buy most of your food from the areas near the walls of the store. This  includes: Fresh fruits and vegetables (produce). Grains, beans, nuts, and seeds. Some of these may be available in unpackaged forms or large amounts (in bulk). Fresh seafood. Poultry and eggs. Low-fat dairy products. Buy whole ingredients instead of prepackaged foods. Buy fresh fruits and vegetables in-season from local farmers markets. Buy frozen fruits and vegetables in resealable bags. If you do not have access to quality fresh seafood, buy precooked frozen shrimp or canned fish, such as tuna, salmon, or sardines. Buy small amounts of raw or cooked vegetables, salads, or olives from the deli or salad bar at your store. Stock your pantry so you always have certain foods on hand, such as olive oil, canned tuna, canned tomatoes, rice, pasta, and beans. Cooking  Cook foods with extra-virgin olive oil instead of using butter or other vegetable oils. Have meat as a side dish, and have vegetables or grains as your main dish. This means having meat in small portions or adding small amounts of meat to foods like pasta or stew. Use beans or vegetables instead of meat in common dishes like chili or lasagna. Experiment with different cooking methods. Try roasting or broiling vegetables instead of steaming or sauteing them. Add frozen vegetables to soups, stews, pasta, or rice. Add nuts or seeds for added healthy fat at each meal. You can add these to yogurt, salads, or vegetable dishes. Marinate fish or vegetables using olive oil, lemon juice, garlic, and  fresh herbs. Meal planning  Plan to eat 1 vegetarian meal one day each week. Try to work up to 2 vegetarian meals, if possible. Eat seafood 2 or more times a week. Have healthy snacks readily available, such as: Vegetable sticks with hummus. Greek yogurt. Fruit and nut trail mix. Eat balanced meals throughout the week. This includes: Fruit: 2-3 servings a day Vegetables: 4-5 servings a day Low-fat dairy: 2 servings a day Fish, poultry, or  lean meat: 1 serving a day Beans and legumes: 2 or more servings a week Nuts and seeds: 1-2 servings a day Whole grains: 6-8 servings a day Extra-virgin olive oil: 3-4 servings a day Limit red meat and sweets to only a few servings a month What are my food choices? Mediterranean diet Recommended Grains: Whole-grain pasta. Brown rice. Bulgar wheat. Polenta. Couscous. Whole-wheat bread. [place] Made[place]. Vegetables: Artichokes. Beets. Broccoli. Cabbage. Carrots. Eggplant. Green beans. Chard. Kale. Spinach. Onions. Leeks. Peas. Squash. Tomatoes. Peppers. Radishes. Fruits: Apples. Apricots. Avocado. Berries. Bananas. Cherries. Dates. Figs. Grapes. Lemons. Melon. Oranges. Peaches. Plums. Pomegranate. Meats and other protein foods: Beans. Almonds. Sunflower seeds. Pine nuts. Peanuts. Cod. Salmon. Scallops. Shrimp. Tuna. Tilapia. Clams. Oysters. Eggs. Dairy: Low-fat milk. Cheese. Greek yogurt. Beverages: Water. Red wine. Herbal tea. Fats and oils: Extra virgin olive oil. Avocado oil. Grape seed oil. Sweets and desserts: Greek yogurt with honey. Baked apples. Poached pears. Trail mix. Seasoning and other foods: Basil. Cilantro. Coriander. Cumin. Mint. Parsley. Sage. Rosemary. Tarragon. Garlic. Oregano. Thyme. Pepper. Balsalmic vinegar. Tahini. Hummus. Tomato sauce. Olives. Mushrooms. Limit these Grains: Prepackaged pasta or rice dishes. Prepackaged cereal with added sugar. Vegetables: Deep fried potatoes (french fries). Fruits: Fruit canned in syrup. Meats and other protein foods: Beef. Pork. Lamb. Poultry with skin. Hot dogs. [place]. Dairy: Ice cream. Sour cream. Whole milk. Beverages: Juice. Sugar-sweetened soft drinks. Beer. Liquor and spirits. Fats and oils: Butter. Canola oil. Vegetable oil. Beef fat (tallow). Lard. Sweets and desserts: Cookies. Cakes. Pies. Candy. Seasoning and other foods: Mayonnaise. Premade sauces and marinades. The items listed may not be a complete list. Talk with your  dietitian about what dietary choices are right for you. Summary The Mediterranean diet includes both food and lifestyle choices. Eat a variety of fresh fruits and vegetables, beans, nuts, seeds, and whole grains. Limit the amount of red meat and sweets that you eat. Talk with your health care provider about whether it is safe for you to drink red wine in moderation. This means 1 glass a day for nonpregnant women and 2 glasses a day for men. A glass of wine equals 5 oz (150 mL). This information is not intended to replace advice given to you by your health care provider. Make sure you discuss any questions you have with your health care provider. Document Released: 08/09/2016 Document Revised: 09/11/2016 Document Reviewed: 08/09/2016 Elsevier Interactive Patient Education  2017 [employer].

## 2024-12-02 ENCOUNTER — Ambulatory Visit: Payer: Self-pay | Admitting: Physician Assistant

## 2024-12-02 ENCOUNTER — Telehealth: Payer: Self-pay

## 2024-12-02 LAB — VITAMIN B12: Vitamin B-12: 723 pg/mL (ref 200–1100)

## 2024-12-02 LAB — TSH: TSH: 3.56 m[IU]/L (ref 0.40–4.50)

## 2024-12-02 NOTE — Telephone Encounter (Signed)
 Spoke with Faith Edwards Faith Edwards states that he does not want to stop something if it is working.  Faith Edwards is unable to tell if medication (zoloft ) is effective. Patient has had a decrease in anxious moments.  Faith Edwards is mainly concerned about the compatibility of namenda  and sertraline 

## 2024-12-02 NOTE — Telephone Encounter (Signed)
 This is okay to take together

## 2024-12-02 NOTE — Telephone Encounter (Signed)
Call returned to patient and/or family member, a detailed message was left with the providers reply.

## 2024-12-02 NOTE — Telephone Encounter (Signed)
 Just to clarify he is wanting to stop the zoloft ? If so recommend taking half tablet for 1 week then stop

## 2024-12-02 NOTE — Telephone Encounter (Signed)
 Copied from CRM #[id number]. Topic: Clinical - Medication Question >> Dec 02, 2024  9:37 AM [name] wrote: Reason for CRM: Patient's spouse is on anti-depressent, they are requesting titrations of med for the depression and other meds  Mr. Faith Edwards is wanting to to know during this process will he remove the anxiety medications as well, or how should he go about doing this. He stated he has spoken with Dr. Camie Sevin in regard to this but was advised she would need to speak with her pcp on what was needed please call pts spouse at [phone], Mr. Pfohl also stated that if he doesn't answer the call due to thinking its spam please leave a brief message so that he can return the call promptly

## 2024-12-04 ENCOUNTER — Ambulatory Visit
Admission: RE | Admit: 2024-12-04 | Discharge: 2024-12-04 | Disposition: A | Source: Ambulatory Visit | Attending: Adult Health | Admitting: Adult Health

## 2024-12-04 DIAGNOSIS — M19012 Primary osteoarthritis, left shoulder: Secondary | ICD-10-CM | POA: Diagnosis not present

## 2024-12-06 NOTE — Patient Outreach (Signed)
 Complex Care Management   Visit Note  11/18/2024  Name:  Faith Edwards MRN: [id number] DOB: [birth year]  Situation: Referral received for Complex Care Management related to Dementia I obtained verbal consent from Patient.  Visit completed with Patient  on the phone  Background:   Past Medical History:  Diagnosis Date   Anxiety    Arthritis    Asthma    Bronchitis    hx of    Chronic headaches    [facility]   COVID-19    Cystitis    1954   Depression    Ear pain    bilat    Hiatal hernia    History of blood clots    Lungs, per [facility] new patient packet    History of blood transfusion    2010   Hx of blood clots 2007 & 2010   post TKR   Hyperthyroidism    Pelvic fracture (HCC)    hx of in three places secondary to fall    Pneumonia    hx of 1952   Pulmonary embolism (HCC) 1993   on HRT   Right wrist fracture    hx of 1995 has plate secondary to fall    Shaking    hands bilat   Shingles     per [facility] new patient packet    Shortness of breath dyspnea    Skin cancer    basal cell    Spinal stenosis    Trauma    left lower leg 10/22/2014    Urinary frequency    Urticaria     Assessment: Patient Reported Symptoms:  Cognitive Cognitive Status: No symptoms reported      Neurological Neurological Review of Symptoms: Not assessed    HEENT HEENT Symptoms Reported: Not assessed      Cardiovascular Cardiovascular Symptoms Reported: Not assessed    Respiratory Respiratory Symptoms Reported: Not assesed    Endocrine Endocrine Symptoms Reported: Not assessed    Gastrointestinal Gastrointestinal Symptoms Reported: Not assessed      Genitourinary Genitourinary Symptoms Reported: Not assessed    Integumentary Integumentary Symptoms Reported: Not assessed    Musculoskeletal Musculoskelatal Symptoms Reviewed: Not assessed        Psychosocial Psychosocial Symptoms Reported: No symptoms reported Behavioral Management Strategies: Coping strategies, Support  system Major Change/Loss/Stressor/Fears (CP): Medical condition, self      12/06/2024    PHQ2-9 Depression Screening   Little interest or pleasure in doing things    Feeling down, depressed, or hopeless    PHQ-2 - Total Score    Trouble falling or staying asleep, or sleeping too much    Feeling tired or having little energy    Poor appetite or overeating     Feeling bad about yourself - or that you are a failure or have let yourself or your family down    Trouble concentrating on things, such as reading the newspaper or watching television    Moving or speaking so slowly that other people could have noticed.  Or the opposite - being so fidgety or restless that you have been moving around a lot more than usual    Thoughts that you would be better off dead, or hurting yourself in some way    PHQ2-9 Total Score    If you checked off any problems, how difficult have these problems made it for you to do your work, take care of things at home, or get along with other people  Depression Interventions/Treatment      There were no vitals filed for this visit.    Medications Reviewed Today     Reviewed by Tylon Kemmerling D, LCSW (Social Worker) on 12/06/24 at 1521  Med List Status: <None>   Medication Order Taking? Sig Documenting Provider Last Dose Status Informant  acetaminophen  (TYLENOL ) 500 MG tablet [id number]  Take 2 tablets (1,000 mg total) by mouth every 8 (eight) hours as needed. Medina-Vargas, Monina C, NP  Active   atorvastatin  (LIPITOR) 10 MG tablet [id number]  Take 1 tablet (10 mg total) by mouth daily. Medina-Vargas, Monina C, NP  Active   calcium  carbonate (CALCIUM  600) 600 MG TABS tablet [id number]  Take 1 tablet (600 mg total) by mouth 2 (two) times daily with a meal. Caro Harlene POUR, NP  Active   Cholecalciferol (VITAMIN D3) 25 MCG (1000 UT) CAPS [id number]  Take by mouth once. [provider]  Active   Cyanocobalamin  (B-12) 1000 MCG CHEW [id number]  Chew 2,000 mcg by  mouth daily. [provider]  Active   denosumab  (PROLIA ) 60 MG/ML SOSY injection [id number]  Inject 60 mg into the skin every 6 (six) months. [provider]  Active   denosumab  (PROLIA ) injection 60 mg [id number]   Eubanks, [name] K, NP  Active   Emollient (CETAPHIL) cream [id number]  Apply topically 2 (two) times daily. Medina-Vargas, Monina C, NP  Active   memantine  (NAMENDA  TITRATION PAK) tablet pack [id number]  5 mg/day for =1 week; 5 mg twice daily for =1 week; 15 mg/day given in 5 mg and 10 mg separated doses for =1 week; then 10 mg twice daily Fargo, Amy E, NP  Active   memantine  (NAMENDA ) 10 MG tablet [id number]  Take 1 tablet (10 mg total) by mouth 2 (two) times daily. Caro Harlene POUR, NP  Active   memantine  (NAMENDA ) 5 MG tablet [id number]  Take 1 tablet (5 mg total) by mouth as directed. 5 mg/day for =1 week; 5 mg twice daily for =1 week; 15 mg/day given in 5 mg and 10 mg separated doses for =1 week; then 10 mg twice daily Fargo, Amy E, NP  Active   sertraline  (ZOLOFT ) 25 MG tablet [id number]  TAKE 1 TABLET(25 MG) BY MOUTH DAILY Eubanks, [name] K, NP  Active   traZODone  (DESYREL ) 50 MG tablet [id number]  TAKE 1/2 TO 1 TABLET(25 TO 50 MG) BY MOUTH AT BEDTIME Eubanks, [name] K, NP  Active   zinc  gluconate 50 MG tablet [id number]  Take 50 mg by mouth daily. [provider]  Active             Recommendation:   Continue Current Plan of Care  Follow Up Plan:   Telephone follow-up 2-4 weeks  Rolin Kerns, LCSW [place]  [facility], [facility] Clinical Social Worker Direct Dial: [phone]  Fax: [phone] Website: [email or web address] 3:52 PM

## 2024-12-06 NOTE — Patient Instructions (Signed)
 Visit Information  Thank you for taking time to visit with me today. Please don't hesitate to contact me if I can be of assistance to you before our next scheduled appointment.  Your next care management appointment is by telephone on 01/05 at 9 AM  Please call the care guide team at [phone] if you need to cancel, schedule, or reschedule an appointment.   Please call the Suicide and Crisis Lifeline: 988 go to [facility] [address] [place] [phone]) call 911 if you are experiencing a Mental Health or Behavioral Health Crisis or need someone to talk to.  Rolin Kerns, LCSW [place]  [facility], [facility] Clinical Social Worker Direct Dial: [phone]  Fax: [phone] Website: [email or web address] 4:01 PM

## 2024-12-07 ENCOUNTER — Encounter: Payer: Self-pay | Admitting: Podiatry

## 2024-12-07 ENCOUNTER — Ambulatory Visit (INDEPENDENT_AMBULATORY_CARE_PROVIDER_SITE_OTHER): Admitting: Podiatry

## 2024-12-07 DIAGNOSIS — B351 Tinea unguium: Secondary | ICD-10-CM | POA: Diagnosis not present

## 2024-12-07 DIAGNOSIS — M79675 Pain in left toe(s): Secondary | ICD-10-CM

## 2024-12-07 NOTE — Progress Notes (Signed)
 Chief Complaint  Patient presents with   Nail Problem    Left foot great toenail thickness x 2 weeks. 10 pain.  Had nail debridement done 11/02/24 with D. Wagoner.   Discussed the use of AI scribe software for clinical note transcription with the patient, who gave verbal consent to proceed.  History of Present Illness Faith Edwards is a 89 year old female who presents with a sore left big toe.  She has been experiencing soreness in her big toe for approximately three months. The discomfort was initially noted during a previous visit when her toenail was trimmed.  The toenail is described as being 'pretty thick' and 'real thick,' contributing to the discomfort. There is a history of a previous attempt to file the nail, but it was not filed extensively. There is also mention of a possible fungal infection in the nail, although previous assessments (according to the patient) indicated no fungus was present.  No lab culture for fungal nail was observed in the system.  Old injuries to the nail are also considered as a potential cause for the thickening.  She has not been using any specific medications for this condition, and there is no mention of current medication use related to the toe issue.  She adamantly refuses to have the nail removed today, even though she states it is severely painful.  Denies any contusion since last seen     Past Medical History:  Diagnosis Date   Anxiety    Arthritis    Asthma    Bronchitis    hx of    Chronic headaches    [facility] Clinic   COVID-19    Cystitis    1954   Depression    Ear pain    bilat    Hiatal hernia    History of blood clots    Lungs, per [facility] new patient packet    History of blood transfusion    2010   Hx of blood clots 2007 & 2010   post TKR   Hyperthyroidism    Pelvic fracture (HCC)    hx of in three places secondary to fall    Pneumonia    hx of 1952   Pulmonary embolism (HCC) 1993   on HRT   Right wrist fracture     hx of 1995 has plate secondary to fall    Shaking    hands bilat   Shingles     per [facility] new patient packet    Shortness of breath dyspnea    Skin cancer    basal cell    Spinal stenosis    Trauma    left lower leg 10/22/2014    Urinary frequency    Urticaria    Past Surgical History:  Procedure Laterality Date   ABDOMINAL HYSTERECTOMY  1969    per [facility] new patient packet    ADENOIDECTOMY     BREAST ENHANCEMENT SURGERY  1985   Cancer removal Right    Right leg (Info given by patient)   CHOLECYSTECTOMY  1992    per [facility] new patient packet    COLONOSCOPY     diverticulosis   ESI  11/2013    C7-T1 ; Dr Bonner   EYE SURGERY     bilat cataract surgery    INSERTION OF MESH N/A 07/08/2015   Procedure: INSERTION OF MESH;  Surgeon: Krystal Spinner, MD;  Location: [facility] ORS;  Service: General;  Laterality: N/A;   REPLACEMENT  TOTAL KNEE BILATERAL  2007 & 2010   SP FACET INJECTION  01/19/2014   RC2-3,3-4,C4-5   TONSILLECTOMY  1945    per [facility] new patient packet    VENTRAL HERNIA REPAIR N/A 07/08/2015   Procedure: VENTRAL ADULT REPAIR VENTRAL INCISIONAL HERNIA REPAIR;  Surgeon: Krystal Spinner, MD;  Location: [facility] ORS;  Service: General;  Laterality: N/A;   WRIST FRACTURE SURGERY Bilateral 10  years and 2 years/   WRIST SURGERY Left 06/2016   plate    Allergies  Allergen Reactions   Penicillins Hives   Sulfa  Antibiotics Hives and Nausea And Vomiting    7-15    Physical Exam EXTREMITIES: Thickened toenail causing pressure and pain on left hallux.  The thickness measures approximately 6 mm, with yellow discoloration, subungual debris, distal onycholysis and pain with compression.  There is no surrounding erythema or drainage noted.  No evidence of paronychia is appreciated.  Epicritic sensation is intact.  Palpable pedal pulses noted   Assessment/Plan of Care: 1. Dermatophytosis of nail   2. 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 Differential diagnosis includes fungal infection, though not confirmed. She prefers conservative management over toenail removal. - Applied 3-WEA nail softener to the right hallux nail for approximately 10 minutes. - Filed down the thickened nail to relieve pressure and alleviate pain.  A power nail Dremel was utilized to perform this.  She tolerated this fairly well.  No further treatment warranted today.  Follow-up as needed  Awanda CHARM [place], DPM, FACFAS [facility]     [address] [place], [place] [address]                Office [phone]  Fax [phone]

## 2024-12-09 ENCOUNTER — Telehealth: Payer: Self-pay

## 2024-12-09 NOTE — Telephone Encounter (Signed)
 Copied from CRM [id number]. Topic: Clinical - Lab/Test Results >> Dec 08, 2024  4:23 PM DeAngela L wrote: Reason for CRM: patient husband calling to ask if the provider has the results from the Xray of the patients left arm and shoulder area informed patient the provider have not had a chance to read over the imaging results and the office can give him a call back after this is put into words   The husband states the pain has continued constantly and the patient is taking tylenol  to deal with the pain day and night   Pt husband [name] [phone]

## 2024-12-09 NOTE — Telephone Encounter (Signed)
-    x-ray done on 12/04/24 has not result yet, will call for result when result is seen.

## 2024-12-09 NOTE — Telephone Encounter (Signed)
 Please advise

## 2024-12-10 DIAGNOSIS — M19012 Primary osteoarthritis, left shoulder: Secondary | ICD-10-CM | POA: Diagnosis not present

## 2024-12-10 NOTE — Telephone Encounter (Signed)
 [name] (husband) is returning call he received yesterday 12/09/24. He stated that he they are still waiting on results and that patient is in pain.

## 2024-12-11 NOTE — Progress Notes (Signed)
-    left shoulder x-ray showed no acute abnormality.

## 2024-12-11 NOTE — Progress Notes (Signed)
 Pls continue Acetaminophen  500 mg take 2 tabs = 1,000 mg by mouth every 8 hours as needed.

## 2024-12-14 ENCOUNTER — Telehealth: Payer: Self-pay

## 2024-12-14 ENCOUNTER — Ambulatory Visit: Admitting: Podiatry

## 2024-12-14 DIAGNOSIS — B351 Tinea unguium: Secondary | ICD-10-CM

## 2024-12-14 DIAGNOSIS — M79675 Pain in left toe(s): Secondary | ICD-10-CM

## 2024-12-14 NOTE — Telephone Encounter (Signed)
 Spoke with patients spouse [name] and he is aware that the x-ray showed no acute abnormality. His concern is taking to much Tylenol . He is wondering if taking magnesium  is an option instead of continuing to take Tylenol . Please advise.

## 2024-12-14 NOTE — Progress Notes (Signed)
°   ° °  Chief Complaint  Patient presents with   Nail Problem    Pain in left hallux nail     History of Present Illness Faith Edwards is a 89 year old female who presents to have her left big toenail checked.  Does not report any swelling, redness or any drainage.  Gets occasional tenderness that seems to be better than what it was previously.  No   Physical Exam General: AAO x3, NAD  Dermatological: Left hallux toenail is hypertrophic, dystrophic with yellow, brown discoloration.  Is thicker compared to the other toenails all the nails have discoloration however.  There is no edema, erythema, signs of infection. Vascular: Dorsalis Pedis artery and Posterior Tibial artery pedal pulses are 2/4 bilateral with immedate capillary fill time. There is no pain with calf compression, swelling, warmth, erythema.  No open lesions.  Neruologic: Grossly intact via light touch bilateral.   Musculoskeletal: Mild discomfort of the left hallux toenail.   Assessment/Plan of Care: 1. Dermatophytosis of nail   2. Pain in left toe(s)     Assessment & Plan Onychogryphosis of the left hallux Chronic onychogryphosis of the left hallux with thickened nail causing pressure and pain.  -Overall symptoms have improved.  I did sharply debride the nails without any complications or bleeding as a courtesy in particular was able to debride the left hallux toenail.  There is no signs of infection but continue to monitor closely.  At this point recommend routine debridement.  She does not want have the nail removed.  Return in about 3 months (around 03/14/2025) for nail tim .  Donnice JONELLE Fees DPM

## 2024-12-14 NOTE — Telephone Encounter (Signed)
 Closing encounter. Issue is being addressed in a result note       Copied from CRM [id number]. Topic: Clinical - Lab/Test Results >> Dec 08, 2024  4:23 PM DeAngela L wrote: Reason for CRM: patient husband calling to ask if the provider has the results from the Xray of the patients left arm and shoulder area informed patient the provider have not had a chance to read over the imaging results and the office can give him a call back after this is put into words   The husband states the pain has continued constantly and the patient is taking tylenol  to deal with the pain day and night   Pt husband [name] [phone]

## 2024-12-14 NOTE — Progress Notes (Signed)
 Magnesium  is not an option in replacing Tylenol . Warm compress can be an alternative.

## 2024-12-22 ENCOUNTER — Ambulatory Visit: Payer: Self-pay

## 2024-12-22 ENCOUNTER — Other Ambulatory Visit: Payer: Self-pay | Admitting: Nurse Practitioner

## 2024-12-22 DIAGNOSIS — G4700 Insomnia, unspecified: Secondary | ICD-10-CM

## 2024-12-22 NOTE — Telephone Encounter (Signed)
Pharmacy requested refill. Pended Rx and sent to [name] for approval due to HIGH ALERT Warning.  

## 2024-12-22 NOTE — Telephone Encounter (Signed)
 2nd call back attempt: LVM to call clinic back

## 2024-12-22 NOTE — Telephone Encounter (Signed)
 On phone with husband and patient for triage, he states she has had a productive choking cough at night that has woken her up and causes her to vomit 10-12 up some white mucous. He states this started 5 days ago and she has had 3 episodes since then. While confirming no difficulty breathing or chest pain, audio connection lost with husband. Routing to call backs.         Copied from CRM #[id number]. Topic: Clinical - Red Word Triage >> Dec 22, 2024  3:05 PM Graeme ORN wrote: Red Word that prompted transfer to Nurse Triage: Aches and pains all over - not getting better taking tylenol  constantly    ----------------------------------------------------------------------- From previous Reason for Contact - Prescription Issue: Reason for CRM:    ----------------------------------------------------------------------- From previous Reason for Contact - Medication Refill: Medication:   Has the patient contacted their pharmacy?   (Agent: If no, request that the patient contact the pharmacy for the refill. If patient does not wish to contact the pharmacy document the reason why and proceed with request.) (Agent: If yes, when and what did the pharmacy advise?)  This is the patient's preferred pharmacy:  [id number] - [place], [place] - [address] AT [facility] OF [address]  [place] [address] Phone: [phone] Fax: [phone]  Is this the correct pharmacy for this prescription?   If no, delete pharmacy and type the correct one.   Has the prescription been filled recently?    Is the patient out of the medication?    Has the patient been seen for an appointment in the last year OR does the patient have an upcoming appointment?    Can we respond through MyChart?    Agent: Please be advised that Rx refills may take up to 3 business days. We ask that you follow-up with your pharmacy.

## 2024-12-22 NOTE — Telephone Encounter (Signed)
 Patient husband called. States pharmacy told  him to call provider for Rx. Rx request already submitted. Patient does not have any medication left per caller. Thank you

## 2024-12-22 NOTE — Telephone Encounter (Signed)
Left message 1st attempt.  

## 2024-12-23 NOTE — Telephone Encounter (Signed)
 Spoke with Husband. Offered an appointment for today and tomorrow. Refused.   Husband scheduled an appointment for 01/05/25

## 2024-12-23 NOTE — Telephone Encounter (Signed)
 Tried calling husband, LMOM to return call.

## 2024-12-25 ENCOUNTER — Ambulatory Visit: Admission: RE | Admit: 2024-12-25 | Source: Ambulatory Visit

## 2025-01-04 ENCOUNTER — Encounter: Payer: Self-pay | Admitting: Licensed Clinical Social Worker

## 2025-01-04 ENCOUNTER — Telehealth: Payer: Self-pay | Admitting: Licensed Clinical Social Worker

## 2025-01-04 ENCOUNTER — Ambulatory Visit: Admitting: Podiatry

## 2025-01-04 NOTE — Patient Instructions (Signed)
 Faith Edwards - I am sorry I was unable to reach you today for our scheduled appointment. I work with Caro Harlene POUR, NP and am calling to support your healthcare needs. Please contact me at [phone] at your earliest convenience. I look forward to speaking with you soon.   Thank you,  Rolin Kerns, LCSW [place]  [facility], [facility] Clinical Social Worker Direct Dial: [phone]  Fax: [phone] Website: [email or web address] 4:47 PM

## 2025-01-05 ENCOUNTER — Ambulatory Visit: Admitting: Family

## 2025-01-05 ENCOUNTER — Encounter: Payer: Self-pay | Admitting: Family

## 2025-01-05 VITALS — BP 124/76 | HR 84 | Temp 97.0°F | Resp 18 | Ht 65.0 in | Wt 145.6 lb

## 2025-01-05 DIAGNOSIS — R238 Other skin changes: Secondary | ICD-10-CM

## 2025-01-05 DIAGNOSIS — R0982 Postnasal drip: Secondary | ICD-10-CM | POA: Diagnosis not present

## 2025-01-05 MED ORDER — LIDOCAINE 4 % EX PTCH: 1.0000 | MEDICATED_PATCH | CUTANEOUS | 3 refills | Status: AC

## 2025-01-05 MED ORDER — FLUTICASONE PROPIONATE 50 MCG/ACT NA SUSP: 2.0000 | Freq: Every day | NASAL | 6 refills | Status: AC

## 2025-01-05 MED ORDER — ZINC OXIDE 11.3 % EX CREA: 1.0000 | TOPICAL_CREAM | Freq: Two times a day (BID) | CUTANEOUS | 3 refills | Status: AC

## 2025-01-08 NOTE — Telephone Encounter (Signed)
 Family cancelled MRI of brain wo contrast per [facility] [phone].FYI

## 2025-01-10 NOTE — Progress Notes (Signed)
 "  Provider: Beuford Garcilazo FNP-C  Faith Harlene POUR, Faith Edwards  Patient Care Team: Faith Harlene POUR, Faith Edwards as PCP - General (Geriatric Medicine) Darlean Ozell NOVAK, MD as Consulting Physician (Pulmonary Disease) Cleotilde Sewer, OD (Optometry) Tasia Lung, MD as Consulting Physician (Psychiatry) Trixie File, MD as Consulting Physician (Internal Medicine) Sissy Sieving, MD as Referring Physician (Orthopedic Surgery) Fleeta Smock, Lamar BROCKS, MD as Consulting Physician (Allergy and Immunology) Aneita Gwendlyn DASEN, MD (Inactive) as Consulting Physician (Gastroenterology) Arnaldo Juliene RAMAN, MD as Attending Physician (Family Medicine) Carlie Clark, MD as Consulting Physician (Otolaryngology) Ezzard Rolin BIRCH, LCSW as VBCI Care Management (Licensed Clinical Social Worker)  Extended Emergency Contact Information Primary Emergency Contact: Lannan,Carl Address: [address]          [place], [place] [address] [place] Home Phone: [phone] Relation: Spouse Secondary Emergency Contact: Honeycutt,Dorothy          [place], [place] [address] [place] Home Phone: [phone] Relation: Daughter  Code Status: Full code Goals of care: Advanced Directive information    12/01/2024    7:56 AM  Advanced Directives  Does Patient Have a Medical Advance Directive? No  Would patient like information on creating a medical advance directive? No - Patient declined     Chief Complaint  Patient presents with   Cough    Pt c/o having a cough for a month.    History of Present Illness   Faith Edwards is a 89 year old female who presents with a persistent cough and drainage in the throat. She is accompanied by her husband, who provides HPI information.  She has been experiencing a cough that worsens at night for the past month, accompanied by a sensation of drainage dripping to the back of her throat upon waking. No medication has been taken to relieve these symptoms. No acid reflux or  burning sensation in the chest.  She reports left arm pain located above the elbow and below the shoulder. No shoulder pain is present.  She complains of skin irritation in the vaginal area, associated with urinary incontinence and the use of incontinence pull-ups.    Past Medical History:  Diagnosis Date   Anxiety    Arthritis    Asthma    Bronchitis    hx of    Chronic headaches    [facility]   COVID-19    Cystitis    1954   Depression    Ear pain    bilat    Hiatal hernia    History of blood clots    Lungs, per [facility] new patient packet    History of blood transfusion    2010   Hx of blood clots 2007 & 2010   post TKR   Hyperthyroidism    Pelvic fracture (HCC)    hx of in three places secondary to fall    Pneumonia    hx of 1952   Pulmonary embolism (HCC) 1993   on HRT   Right wrist fracture    hx of 1995 has plate secondary to fall    Shaking    hands bilat   Shingles     per [facility] new patient packet    Shortness of breath dyspnea    Skin cancer    basal cell    Spinal stenosis    Trauma    left lower leg 10/22/2014    Urinary frequency    Urticaria    Past Surgical History:  Procedure Laterality Date   ABDOMINAL HYSTERECTOMY  1969    per [facility] new patient packet    ADENOIDECTOMY     BREAST ENHANCEMENT SURGERY  1985   Cancer removal Right    Right leg (Info given by patient)   CHOLECYSTECTOMY  1992    per [facility] new patient packet    COLONOSCOPY     diverticulosis   ESI  11/2013    C7-T1 ; Dr Bonner   EYE SURGERY     bilat cataract surgery    INSERTION OF MESH N/A 07/08/2015   Procedure: INSERTION OF MESH;  Surgeon: Krystal Spinner, MD;  Location: [facility];  Service: General;  Laterality: N/A;   REPLACEMENT TOTAL KNEE BILATERAL  2007 & 2010   SP FACET INJECTION  01/19/2014   RC2-3,3-4,C4-5   TONSILLECTOMY  1945    per [facility] new patient packet    VENTRAL HERNIA REPAIR N/A 07/08/2015   Procedure: VENTRAL ADULT REPAIR VENTRAL INCISIONAL HERNIA REPAIR;   Surgeon: Krystal Spinner, MD;  Location: [facility];  Service: General;  Laterality: N/A;   WRIST FRACTURE SURGERY Bilateral 10  years and 2 years/   WRIST SURGERY Left 06/2016   plate     [id number][8]  Outpatient Encounter Medications as of 01/05/2025  Medication Sig   acetaminophen  (TYLENOL ) 500 MG tablet Take 2 tablets (1,000 mg total) by mouth every 8 (eight) hours as needed.   atorvastatin  (LIPITOR) 10 MG tablet Take 1 tablet (10 mg total) by mouth daily.   calcium  carbonate (CALCIUM  600) 600 MG TABS tablet Take 1 tablet (600 mg total) by mouth 2 (two) times daily with a meal.   Cholecalciferol (VITAMIN D3) 25 MCG (1000 UT) CAPS Take by mouth once.   Cyanocobalamin  (B-12) 1000 MCG CHEW Chew 2,000 mcg by mouth daily.   denosumab  (PROLIA ) 60 MG/ML SOSY injection Inject 60 mg into the skin every 6 (six) months.   Emollient (CETAPHIL) cream Apply topically 2 (two) times daily.   fluticasone  (FLONASE ) 50 MCG/ACT nasal spray Place 2 sprays into both nostrils daily.   lidocaine  4 % Place 1 patch onto the skin daily.   memantine  (NAMENDA  TITRATION PAK) tablet pack 5 mg/day for =1 week; 5 mg twice daily for =1 week; 15 mg/day given in 5 mg and 10 mg separated doses for =1 week; then 10 mg twice daily   memantine  (NAMENDA ) 10 MG tablet Take 1 tablet (10 mg total) by mouth 2 (two) times daily.   memantine  (NAMENDA ) 5 MG tablet Take 1 tablet (5 mg total) by mouth as directed. 5 mg/day for =1 week; 5 mg twice daily for =1 week; 15 mg/day given in 5 mg and 10 mg separated doses for =1 week; then 10 mg twice daily   sertraline  (ZOLOFT ) 25 MG tablet TAKE 1 TABLET(25 MG) BY MOUTH DAILY   traZODone  (DESYREL ) 50 MG tablet TAKE 1/2 TO 1 TABLET(25 TO 50 MG) BY MOUTH AT BEDTIME   zinc  gluconate 50 MG tablet Take 50 mg by mouth daily.   zinc  oxide (BALMEX) 11.3 % CREA cream Apply 1 Application topically 2 (two) times daily.   Facility-Administered Encounter Medications as of 01/05/2025  Medication   [START ON  02/22/2025] denosumab  (PROLIA ) injection 60 mg    Review of Systems  Constitutional:  Negative for appetite change, chills, fatigue, fever and unexpected weight change.  HENT:  Positive for postnasal drip. Negative for congestion, dental problem, ear discharge, ear pain, facial swelling, hearing loss, nosebleeds, rhinorrhea, sinus pressure,  sinus pain, sneezing, sore throat and tinnitus.   Eyes:  Negative for pain, discharge, redness, itching and visual disturbance.  Respiratory:  Positive for cough. Negative for chest tightness, shortness of breath and wheezing.   Cardiovascular:  Negative for chest pain, palpitations and leg swelling.  Gastrointestinal:  Negative for abdominal distention, abdominal pain, constipation, diarrhea, nausea and vomiting.  Genitourinary:  Negative for difficulty urinating, dysuria, flank pain and urgency.       Urine incontinence Peri-area skin irritation  Musculoskeletal:  Positive for arthralgias. Negative for back pain, joint swelling, myalgias, neck pain and neck stiffness.       Left arm pain  Skin:  Negative for color change, pallor, rash and wound.  Neurological:  Negative for dizziness, syncope, speech difficulty, weakness, light-headedness, numbness and headaches.    Immunization History  Administered Date(s) Administered   Fluad Quad(high Dose 65+) 09/24/2019, 10/24/2020, 09/21/2021   INFLUENZA, HIGH DOSE SEASONAL PF 11/13/2022   Influenza Whole 10/28/2008, 09/28/2009, 10/08/2011   Influenza, Seasonal, Injecte, Preservative Fre 11/03/2013   Influenza,inj,Quad PF,6+ Mos 10/05/2014, 09/30/2015   Influenza-Unspecified 09/25/2016, 09/09/2017, 07/31/2018, 11/13/2022   Moderna Covid-19 Fall Seasonal Vaccine 57yrs & older 11/13/2022   PFIZER(Purple Top)SARS-COV-2 Vaccination 01/01/2020, 01/30/2020, 02/22/2020, 12/11/2020   Pneumococcal Conjugate-13 09/30/2015   Pneumococcal Polysaccharide-23 01/07/2014   Respiratory Syncytial Virus Vaccine,Recomb  Aduvanted(Arexvy) 12/18/2022   Tdap 10/10/2014, 07/01/2021   Zoster Recombinant(Shingrix) 07/05/2018, 09/09/2018   Zoster, Live 03/18/2015   Pertinent  Health Maintenance Due  Topic Date Due   Bone Density Scan  10/13/2021   Influenza Vaccine  05/11/2025 (Originally 07/31/2024)      08/21/2024   12:01 PM 09/18/2024    9:32 AM 10/23/2024   11:34 AM 10/30/2024   11:28 AM 12/01/2024    7:56 AM  Fall Risk  Falls in the past year? 0 0 0 0 0  Was there an injury with Fall?  0  0  0  0  Fall Risk Category Calculator  0 0 0 0  Patient at Risk for Falls Due to No Fall Risks No Fall Risks No Fall Risks No Fall Risks   Fall risk Follow up Falls evaluation completed  Falls evaluation completed Falls evaluation completed Falls evaluation completed     Data saved with a previous flowsheet row definition   Functional Status Survey:    Vitals:   01/05/25 1420  BP: 124/76  Pulse: 84  Resp: 18  Temp: (!) 97 F (36.1 C)  SpO2: 94%  Weight: 145 lb 9.6 oz (66 kg)  Height: 5' 5 (1.651 m)   Body mass index is 24.23 kg/m. Physical Exam  GENERAL: Alert, cooperative, well developed, no acute distress HEENT: Normocephalic, normal oropharynx, moist mucous membranes CHEST: Clear to auscultation bilaterally, No wheezes, rhonchi, or crackles CARDIOVASCULAR: Normal heart rate and rhythm, S1 and S2 normal without murmurs ABDOMEN: Soft, non-tender, non-distended, without organomegaly, Normal bowel sounds EXTREMITIES: No cyanosis or edema NEUROLOGICAL: Cranial nerves grossly intact, Moves all extremities without gross motor or sensory deficit  SKIN: No rash,no lesion or erythema   PSYCHIATRY/BEHAVIORAL: Mood stable   Labs reviewed: Recent Labs    02/07/24 0949 08/10/24 1004 10/23/24 1217  NA 139 137 136  K 4.6 4.8 4.9  CL 104 104 103  CO2 26 23 25   GLUCOSE 111* 101* 105*  BUN 21 25 20   CREATININE 1.08* 1.05* 0.94  CALCIUM  9.9 9.4 9.5   Recent Labs    02/07/24 0949 08/10/24 1004  AST  19 25  ALT 19 23  BILITOT 0.8 1.1  PROT 6.9 6.5   Recent Labs    02/21/24 1517 08/10/24 1004  WBC 6.9 8.2  NEUTROABS 3,664 5,797  HGB 13.2 12.8  HCT 39.9 39.2  MCV 96.6 99.5  PLT 290 269   Lab Results  Component Value Date   TSH 3.56 12/01/2024   Lab Results  Component Value Date   HGBA1C 5.6 01/05/2021   Lab Results  Component Value Date   CHOL 152 08/10/2024   HDL 75 08/10/2024   LDLCALC 61 08/10/2024   TRIG 78 08/10/2024   CHOLHDL 2.0 08/10/2024    Significant Diagnostic Results in last 30 days:  No results found.  Assessment/Plan  Postnasal drip Cough and throat drainage for one month, worse at night. No acid reflux or chest burning. Possible postnasal drip contributing to symptoms. - Consider anti-acid medication if drainage persists to assist with nighttime cough.  Left upper arm pain Pain above the elbow and below the shoulder. Warm wrap or compression provides relief. Declines referral to orthopedics. - Recommended lidocaine  patch, topical, one daily.  Vulvar irritation due to urinary incontinence Skin irritation in the vaginal area due to urinary incontinence. Uses incontinence pull-ups, leading to skin retention. - Apply Desitin or zinc  oxide cream at least twice daily and as needed with diaper change.   Family/ staff Communication: Reviewed plan of care with patient verbalized understanding   Labs/tests ordered: None   Next Appointment: Return if symptoms worsen or fail to improve.   Total time: 20 minutes. Greater than 50% of total time spent doing patient education regarding PND,Peri-area skin irritation,left arm pain,health maintenance including symptom/medication management.   Roxan BROCKS Cameron Katayama, Faith Edwards    [1]  Allergies Allergen Reactions   Penicillins Hives   Sulfa  Antibiotics Hives and Nausea And Vomiting    7-15   "

## 2025-01-11 ENCOUNTER — Other Ambulatory Visit: Payer: Self-pay | Admitting: Nurse Practitioner

## 2025-01-11 DIAGNOSIS — F419 Anxiety disorder, unspecified: Secondary | ICD-10-CM

## 2025-01-25 ENCOUNTER — Ambulatory Visit: Payer: Self-pay | Admitting: Nurse Practitioner

## 2025-02-01 ENCOUNTER — Telehealth: Payer: Self-pay | Admitting: Licensed Clinical Social Worker

## 2025-02-01 ENCOUNTER — Encounter: Payer: Self-pay | Admitting: Licensed Clinical Social Worker

## 2025-02-01 ENCOUNTER — Ambulatory Visit: Admitting: Nurse Practitioner

## 2025-02-01 NOTE — Patient Instructions (Signed)
 Kimberlyn J Winkleman - I am sorry I was unable to reach you today for our scheduled appointment. I work with Caro Harlene POUR, NP and am calling to support your healthcare needs. Please contact me at [phone] at your earliest convenience. I look forward to speaking with you soon.   Thank you,  Rolin Kerns, LCSW [place]  [facility], [facility] Clinical Social Worker Direct Dial: [phone]  Fax: [phone] Website: [email or web address] 10:37 AM

## 2025-02-09 ENCOUNTER — Ambulatory Visit: Admitting: Nurse Practitioner

## 2025-02-17 ENCOUNTER — Telehealth: Admitting: Licensed Clinical Social Worker

## 2025-02-18 ENCOUNTER — Other Ambulatory Visit: Payer: Self-pay

## 2025-02-22 ENCOUNTER — Ambulatory Visit: Payer: Self-pay | Admitting: Nurse Practitioner

## 2025-03-10 ENCOUNTER — Ambulatory Visit: Payer: Self-pay | Admitting: Physician Assistant

## 2025-03-15 ENCOUNTER — Ambulatory Visit: Admitting: Podiatry

## 2025-11-05 ENCOUNTER — Ambulatory Visit: Admitting: Nurse Practitioner
